# Patient Record
Sex: Female | Born: 1935 | Race: White | Hispanic: No | Marital: Married | Smoking: Never smoker
Health system: Southern US, Community
[De-identification: ages and names within clinical notes are randomized; demographics above are authoritative.]

## PROBLEM LIST (undated history)

## (undated) DIAGNOSIS — K648 Other hemorrhoids: Secondary | ICD-10-CM

## (undated) DIAGNOSIS — E785 Hyperlipidemia, unspecified: Secondary | ICD-10-CM

## (undated) DIAGNOSIS — Z923 Personal history of irradiation: Secondary | ICD-10-CM

## (undated) DIAGNOSIS — M797 Fibromyalgia: Secondary | ICD-10-CM

## (undated) DIAGNOSIS — F32A Depression, unspecified: Secondary | ICD-10-CM

## (undated) DIAGNOSIS — K589 Irritable bowel syndrome without diarrhea: Secondary | ICD-10-CM

## (undated) DIAGNOSIS — T7840XA Allergy, unspecified, initial encounter: Secondary | ICD-10-CM

## (undated) DIAGNOSIS — C50919 Malignant neoplasm of unspecified site of unspecified female breast: Secondary | ICD-10-CM

## (undated) DIAGNOSIS — K559 Vascular disorder of intestine, unspecified: Secondary | ICD-10-CM

## (undated) DIAGNOSIS — H269 Unspecified cataract: Secondary | ICD-10-CM

## (undated) DIAGNOSIS — K635 Polyp of colon: Secondary | ICD-10-CM

## (undated) DIAGNOSIS — K219 Gastro-esophageal reflux disease without esophagitis: Secondary | ICD-10-CM

## (undated) DIAGNOSIS — I341 Nonrheumatic mitral (valve) prolapse: Secondary | ICD-10-CM

## (undated) DIAGNOSIS — F419 Anxiety disorder, unspecified: Secondary | ICD-10-CM

## (undated) DIAGNOSIS — I10 Essential (primary) hypertension: Secondary | ICD-10-CM

## (undated) DIAGNOSIS — F329 Major depressive disorder, single episode, unspecified: Secondary | ICD-10-CM

## (undated) HISTORY — DX: Polyp of colon: K63.5

## (undated) HISTORY — PX: APPENDECTOMY: SHX54

## (undated) HISTORY — DX: Malignant neoplasm of unspecified site of unspecified female breast: C50.919

## (undated) HISTORY — PX: CATARACT EXTRACTION: SUR2

## (undated) HISTORY — PX: BACK SURGERY: SHX140

## (undated) HISTORY — PX: TOTAL ABDOMINAL HYSTERECTOMY: SHX209

## (undated) HISTORY — DX: Gastro-esophageal reflux disease without esophagitis: K21.9

## (undated) HISTORY — DX: Anxiety disorder, unspecified: F41.9

## (undated) HISTORY — DX: Fibromyalgia: M79.7

## (undated) HISTORY — PX: DILATION AND CURETTAGE OF UTERUS: SHX78

## (undated) HISTORY — DX: Depression, unspecified: F32.A

## (undated) HISTORY — DX: Vascular disorder of intestine, unspecified: K55.9

## (undated) HISTORY — DX: Nonrheumatic mitral (valve) prolapse: I34.1

## (undated) HISTORY — PX: CARPAL TUNNEL RELEASE: SHX101

## (undated) HISTORY — DX: Allergy, unspecified, initial encounter: T78.40XA

## (undated) HISTORY — PX: MASTECTOMY: SHX3

## (undated) HISTORY — DX: Irritable bowel syndrome, unspecified: K58.9

## (undated) HISTORY — DX: Major depressive disorder, single episode, unspecified: F32.9

## (undated) HISTORY — DX: Other hemorrhoids: K64.8

## (undated) HISTORY — DX: Hyperlipidemia, unspecified: E78.5

---

## 1998-01-29 ENCOUNTER — Other Ambulatory Visit: Admission: RE | Admit: 1998-01-29 | Discharge: 1998-01-29 | Payer: Self-pay | Admitting: Obstetrics and Gynecology

## 1998-05-01 ENCOUNTER — Encounter (HOSPITAL_COMMUNITY): Admission: RE | Admit: 1998-05-01 | Discharge: 1998-07-30 | Payer: Self-pay | Admitting: Internal Medicine

## 1998-05-02 ENCOUNTER — Ambulatory Visit (HOSPITAL_COMMUNITY): Admission: RE | Admit: 1998-05-02 | Discharge: 1998-05-02 | Payer: Self-pay | Admitting: Psychiatry

## 1999-06-05 ENCOUNTER — Emergency Department (HOSPITAL_COMMUNITY): Admission: EM | Admit: 1999-06-05 | Discharge: 1999-06-05 | Payer: Self-pay | Admitting: Emergency Medicine

## 1999-06-26 ENCOUNTER — Other Ambulatory Visit: Admission: RE | Admit: 1999-06-26 | Discharge: 1999-06-26 | Payer: Self-pay | Admitting: Obstetrics and Gynecology

## 1999-09-29 ENCOUNTER — Emergency Department (HOSPITAL_COMMUNITY): Admission: EM | Admit: 1999-09-29 | Discharge: 1999-09-29 | Payer: Self-pay | Admitting: Emergency Medicine

## 1999-11-10 ENCOUNTER — Encounter: Admission: RE | Admit: 1999-11-10 | Discharge: 1999-11-10 | Payer: Self-pay | Admitting: Gastroenterology

## 1999-11-10 ENCOUNTER — Encounter: Payer: Self-pay | Admitting: Gastroenterology

## 2000-02-05 ENCOUNTER — Encounter: Payer: Self-pay | Admitting: Allergy and Immunology

## 2000-02-05 ENCOUNTER — Encounter: Admission: RE | Admit: 2000-02-05 | Discharge: 2000-02-05 | Payer: Self-pay | Admitting: *Deleted

## 2000-02-16 ENCOUNTER — Encounter: Payer: Self-pay | Admitting: *Deleted

## 2000-02-16 ENCOUNTER — Encounter: Admission: RE | Admit: 2000-02-16 | Discharge: 2000-02-16 | Payer: Self-pay | Admitting: *Deleted

## 2000-10-18 ENCOUNTER — Encounter: Payer: Self-pay | Admitting: Emergency Medicine

## 2000-10-18 ENCOUNTER — Emergency Department (HOSPITAL_COMMUNITY): Admission: EM | Admit: 2000-10-18 | Discharge: 2000-10-18 | Payer: Self-pay | Admitting: Emergency Medicine

## 2001-01-27 ENCOUNTER — Encounter: Admission: RE | Admit: 2001-01-27 | Discharge: 2001-01-27 | Payer: Self-pay | Admitting: Obstetrics and Gynecology

## 2001-01-27 ENCOUNTER — Encounter: Payer: Self-pay | Admitting: Obstetrics and Gynecology

## 2001-02-28 ENCOUNTER — Other Ambulatory Visit: Admission: RE | Admit: 2001-02-28 | Discharge: 2001-02-28 | Payer: Self-pay | Admitting: Obstetrics and Gynecology

## 2001-06-27 ENCOUNTER — Encounter: Payer: Self-pay | Admitting: Family Medicine

## 2001-06-27 ENCOUNTER — Ambulatory Visit (HOSPITAL_COMMUNITY): Admission: RE | Admit: 2001-06-27 | Discharge: 2001-06-27 | Payer: Self-pay | Admitting: Family Medicine

## 2001-07-21 ENCOUNTER — Emergency Department (HOSPITAL_COMMUNITY): Admission: EM | Admit: 2001-07-21 | Discharge: 2001-07-21 | Payer: Self-pay | Admitting: Emergency Medicine

## 2002-04-19 ENCOUNTER — Other Ambulatory Visit: Admission: RE | Admit: 2002-04-19 | Discharge: 2002-04-19 | Payer: Self-pay | Admitting: Obstetrics and Gynecology

## 2003-01-15 ENCOUNTER — Encounter (INDEPENDENT_AMBULATORY_CARE_PROVIDER_SITE_OTHER): Payer: Self-pay | Admitting: Cardiology

## 2003-01-15 ENCOUNTER — Ambulatory Visit (HOSPITAL_COMMUNITY): Admission: RE | Admit: 2003-01-15 | Discharge: 2003-01-15 | Payer: Self-pay | Admitting: Cardiology

## 2003-04-10 ENCOUNTER — Encounter: Payer: Self-pay | Admitting: Obstetrics and Gynecology

## 2003-04-10 ENCOUNTER — Ambulatory Visit (HOSPITAL_COMMUNITY): Admission: RE | Admit: 2003-04-10 | Discharge: 2003-04-10 | Payer: Self-pay | Admitting: Obstetrics and Gynecology

## 2003-05-03 ENCOUNTER — Encounter: Admission: RE | Admit: 2003-05-03 | Discharge: 2003-05-03 | Payer: Self-pay | Admitting: Geriatric Medicine

## 2003-05-03 ENCOUNTER — Encounter: Payer: Self-pay | Admitting: Geriatric Medicine

## 2003-05-20 ENCOUNTER — Emergency Department (HOSPITAL_COMMUNITY): Admission: EM | Admit: 2003-05-20 | Discharge: 2003-05-21 | Payer: Self-pay | Admitting: Emergency Medicine

## 2003-05-21 ENCOUNTER — Encounter: Payer: Self-pay | Admitting: Emergency Medicine

## 2003-08-05 ENCOUNTER — Other Ambulatory Visit: Admission: RE | Admit: 2003-08-05 | Discharge: 2003-08-05 | Payer: Self-pay | Admitting: Obstetrics and Gynecology

## 2003-10-13 ENCOUNTER — Emergency Department (HOSPITAL_COMMUNITY): Admission: EM | Admit: 2003-10-13 | Discharge: 2003-10-13 | Payer: Self-pay | Admitting: Emergency Medicine

## 2004-02-09 ENCOUNTER — Encounter: Admission: RE | Admit: 2004-02-09 | Discharge: 2004-02-09 | Payer: Self-pay | Admitting: Geriatric Medicine

## 2004-05-19 ENCOUNTER — Observation Stay (HOSPITAL_COMMUNITY): Admission: AD | Admit: 2004-05-19 | Discharge: 2004-05-20 | Payer: Self-pay | Admitting: Internal Medicine

## 2004-07-04 ENCOUNTER — Inpatient Hospital Stay (HOSPITAL_COMMUNITY): Admission: EM | Admit: 2004-07-04 | Discharge: 2004-07-06 | Payer: Self-pay | Admitting: Emergency Medicine

## 2004-07-04 ENCOUNTER — Encounter (INDEPENDENT_AMBULATORY_CARE_PROVIDER_SITE_OTHER): Payer: Self-pay | Admitting: Specialist

## 2004-08-07 ENCOUNTER — Inpatient Hospital Stay (HOSPITAL_BASED_OUTPATIENT_CLINIC_OR_DEPARTMENT_OTHER): Admission: RE | Admit: 2004-08-07 | Discharge: 2004-08-07 | Payer: Self-pay | Admitting: Cardiology

## 2004-09-23 ENCOUNTER — Ambulatory Visit (HOSPITAL_COMMUNITY): Admission: RE | Admit: 2004-09-23 | Discharge: 2004-09-23 | Payer: Self-pay | Admitting: Allergy

## 2005-08-25 ENCOUNTER — Encounter: Admission: RE | Admit: 2005-08-25 | Discharge: 2005-11-23 | Payer: Self-pay | Admitting: *Deleted

## 2005-08-29 ENCOUNTER — Emergency Department (HOSPITAL_COMMUNITY): Admission: EM | Admit: 2005-08-29 | Discharge: 2005-08-29 | Payer: Self-pay | Admitting: *Deleted

## 2005-08-30 ENCOUNTER — Encounter: Admission: RE | Admit: 2005-08-30 | Discharge: 2005-08-30 | Payer: Self-pay | Admitting: Gastroenterology

## 2005-09-01 ENCOUNTER — Encounter: Admission: RE | Admit: 2005-09-01 | Discharge: 2005-09-01 | Payer: Self-pay | Admitting: Gastroenterology

## 2005-09-06 ENCOUNTER — Ambulatory Visit (HOSPITAL_COMMUNITY): Admission: RE | Admit: 2005-09-06 | Discharge: 2005-09-06 | Payer: Self-pay | Admitting: Gastroenterology

## 2005-09-27 ENCOUNTER — Encounter (INDEPENDENT_AMBULATORY_CARE_PROVIDER_SITE_OTHER): Payer: Self-pay | Admitting: Diagnostic Radiology

## 2005-09-27 ENCOUNTER — Encounter: Admission: RE | Admit: 2005-09-27 | Discharge: 2005-09-27 | Payer: Self-pay | Admitting: Gastroenterology

## 2005-09-27 ENCOUNTER — Encounter (INDEPENDENT_AMBULATORY_CARE_PROVIDER_SITE_OTHER): Payer: Self-pay | Admitting: *Deleted

## 2005-10-06 ENCOUNTER — Encounter: Admission: RE | Admit: 2005-10-06 | Discharge: 2005-10-06 | Payer: Self-pay | Admitting: Surgery

## 2005-10-12 ENCOUNTER — Ambulatory Visit (HOSPITAL_COMMUNITY): Admission: RE | Admit: 2005-10-12 | Discharge: 2005-10-12 | Payer: Self-pay | Admitting: Surgery

## 2005-10-14 ENCOUNTER — Emergency Department (HOSPITAL_COMMUNITY): Admission: EM | Admit: 2005-10-14 | Discharge: 2005-10-14 | Payer: Self-pay | Admitting: Emergency Medicine

## 2005-10-26 ENCOUNTER — Other Ambulatory Visit: Admission: RE | Admit: 2005-10-26 | Discharge: 2005-10-26 | Payer: Self-pay | Admitting: *Deleted

## 2005-11-02 ENCOUNTER — Inpatient Hospital Stay (HOSPITAL_COMMUNITY): Admission: RE | Admit: 2005-11-02 | Discharge: 2005-11-04 | Payer: Self-pay | Admitting: Surgery

## 2005-11-02 ENCOUNTER — Encounter (INDEPENDENT_AMBULATORY_CARE_PROVIDER_SITE_OTHER): Payer: Self-pay | Admitting: *Deleted

## 2005-11-09 ENCOUNTER — Ambulatory Visit: Payer: Self-pay | Admitting: Oncology

## 2005-11-15 ENCOUNTER — Ambulatory Visit: Admission: RE | Admit: 2005-11-15 | Discharge: 2006-01-26 | Payer: Self-pay | Admitting: *Deleted

## 2005-11-17 ENCOUNTER — Ambulatory Visit (HOSPITAL_COMMUNITY): Admission: RE | Admit: 2005-11-17 | Discharge: 2005-11-17 | Payer: Self-pay | Admitting: Oncology

## 2005-11-20 ENCOUNTER — Emergency Department (HOSPITAL_COMMUNITY): Admission: EM | Admit: 2005-11-20 | Discharge: 2005-11-20 | Payer: Self-pay | Admitting: Emergency Medicine

## 2005-12-05 ENCOUNTER — Encounter: Admission: RE | Admit: 2005-12-05 | Discharge: 2005-12-05 | Payer: Self-pay | Admitting: *Deleted

## 2005-12-10 ENCOUNTER — Encounter: Admission: RE | Admit: 2005-12-10 | Discharge: 2005-12-10 | Payer: Self-pay | Admitting: Surgery

## 2005-12-22 ENCOUNTER — Ambulatory Visit: Payer: Self-pay | Admitting: Oncology

## 2005-12-30 ENCOUNTER — Emergency Department (HOSPITAL_COMMUNITY): Admission: EM | Admit: 2005-12-30 | Discharge: 2005-12-30 | Payer: Self-pay | Admitting: Emergency Medicine

## 2006-01-10 ENCOUNTER — Encounter: Admission: RE | Admit: 2006-01-10 | Discharge: 2006-02-15 | Payer: Self-pay | Admitting: Oncology

## 2006-01-20 LAB — CBC WITH DIFFERENTIAL/PLATELET
Eosinophils Absolute: 0.1 10*3/uL (ref 0.0–0.5)
MONO#: 0.7 10*3/uL (ref 0.1–0.9)
MONO%: 15.1 % — ABNORMAL HIGH (ref 0.0–13.0)
NEUT#: 3.2 10*3/uL (ref 1.5–6.5)
RBC: 3.59 10*6/uL — ABNORMAL LOW (ref 3.70–5.32)
RDW: 13.8 % (ref 11.3–14.5)
WBC: 4.7 10*3/uL (ref 3.9–10.0)

## 2006-01-26 ENCOUNTER — Encounter: Admission: RE | Admit: 2006-01-26 | Discharge: 2006-01-26 | Payer: Self-pay | Admitting: Oncology

## 2006-02-12 ENCOUNTER — Observation Stay (HOSPITAL_COMMUNITY): Admission: EM | Admit: 2006-02-12 | Discharge: 2006-02-13 | Payer: Self-pay | Admitting: Emergency Medicine

## 2006-03-01 ENCOUNTER — Ambulatory Visit: Payer: Self-pay | Admitting: Oncology

## 2006-03-24 ENCOUNTER — Ambulatory Visit: Payer: Self-pay | Admitting: Pulmonary Disease

## 2006-03-28 ENCOUNTER — Ambulatory Visit: Payer: Self-pay | Admitting: Cardiology

## 2006-03-29 ENCOUNTER — Ambulatory Visit: Payer: Self-pay | Admitting: Internal Medicine

## 2006-04-05 ENCOUNTER — Encounter: Admission: RE | Admit: 2006-04-05 | Discharge: 2006-04-05 | Payer: Self-pay | Admitting: Oncology

## 2006-04-18 ENCOUNTER — Ambulatory Visit: Payer: Self-pay | Admitting: Oncology

## 2006-04-22 ENCOUNTER — Ambulatory Visit (HOSPITAL_COMMUNITY): Admission: RE | Admit: 2006-04-22 | Discharge: 2006-04-22 | Payer: Self-pay | Admitting: Oncology

## 2006-05-24 ENCOUNTER — Encounter: Admission: RE | Admit: 2006-05-24 | Discharge: 2006-05-24 | Payer: Self-pay | Admitting: Neurosurgery

## 2006-07-17 ENCOUNTER — Emergency Department (HOSPITAL_COMMUNITY): Admission: EM | Admit: 2006-07-17 | Discharge: 2006-07-17 | Payer: Self-pay | Admitting: Emergency Medicine

## 2006-07-29 ENCOUNTER — Ambulatory Visit: Payer: Self-pay | Admitting: Oncology

## 2006-08-05 ENCOUNTER — Encounter: Admission: RE | Admit: 2006-08-05 | Discharge: 2006-08-05 | Payer: Self-pay | Admitting: Oncology

## 2006-08-10 ENCOUNTER — Encounter: Admission: RE | Admit: 2006-08-10 | Discharge: 2006-08-10 | Payer: Self-pay | Admitting: Oncology

## 2006-09-29 ENCOUNTER — Ambulatory Visit: Payer: Self-pay | Admitting: Oncology

## 2006-10-18 HISTORY — PX: ESOPHAGOGASTRODUODENOSCOPY: SHX1529

## 2006-11-11 ENCOUNTER — Ambulatory Visit: Payer: Self-pay | Admitting: Oncology

## 2006-11-15 LAB — COMPREHENSIVE METABOLIC PANEL
Albumin: 4.3 g/dL (ref 3.5–5.2)
CO2: 28 mEq/L (ref 19–32)
Calcium: 9.4 mg/dL (ref 8.4–10.5)
Chloride: 104 mEq/L (ref 96–112)
Glucose, Bld: 108 mg/dL — ABNORMAL HIGH (ref 70–99)
Sodium: 141 mEq/L (ref 135–145)
Total Bilirubin: 0.3 mg/dL (ref 0.3–1.2)
Total Protein: 6.7 g/dL (ref 6.0–8.3)

## 2006-11-15 LAB — CBC WITH DIFFERENTIAL/PLATELET
Eosinophils Absolute: 0.1 10*3/uL (ref 0.0–0.5)
HCT: 37.7 % (ref 34.8–46.6)
LYMPH%: 25.3 % (ref 14.0–48.0)
MONO#: 0.5 10*3/uL (ref 0.1–0.9)
NEUT#: 3.2 10*3/uL (ref 1.5–6.5)
Platelets: 259 10*3/uL (ref 145–400)
RBC: 3.94 10*6/uL (ref 3.70–5.32)
WBC: 5 10*3/uL (ref 3.9–10.0)
lymph#: 1.3 10*3/uL (ref 0.9–3.3)

## 2006-11-15 LAB — CANCER ANTIGEN 27.29: CA 27.29: 34 U/mL (ref 0–39)

## 2006-11-17 ENCOUNTER — Ambulatory Visit (HOSPITAL_COMMUNITY): Admission: RE | Admit: 2006-11-17 | Discharge: 2006-11-17 | Payer: Self-pay | Admitting: Oncology

## 2006-12-08 ENCOUNTER — Encounter: Admission: RE | Admit: 2006-12-08 | Discharge: 2006-12-08 | Payer: Self-pay | Admitting: Neurosurgery

## 2006-12-21 ENCOUNTER — Other Ambulatory Visit: Admission: RE | Admit: 2006-12-21 | Discharge: 2006-12-21 | Payer: Self-pay | Admitting: *Deleted

## 2007-01-11 ENCOUNTER — Inpatient Hospital Stay (HOSPITAL_COMMUNITY): Admission: EM | Admit: 2007-01-11 | Discharge: 2007-01-13 | Payer: Self-pay | Admitting: Emergency Medicine

## 2007-01-26 ENCOUNTER — Encounter: Admission: RE | Admit: 2007-01-26 | Discharge: 2007-04-14 | Payer: Self-pay | Admitting: Oncology

## 2007-02-18 ENCOUNTER — Emergency Department (HOSPITAL_COMMUNITY): Admission: EM | Admit: 2007-02-18 | Discharge: 2007-02-18 | Payer: Self-pay | Admitting: *Deleted

## 2007-02-27 ENCOUNTER — Encounter: Admission: RE | Admit: 2007-02-27 | Discharge: 2007-02-27 | Payer: Self-pay | Admitting: Gastroenterology

## 2007-04-07 ENCOUNTER — Encounter: Admission: RE | Admit: 2007-04-07 | Discharge: 2007-04-07 | Payer: Self-pay | Admitting: Oncology

## 2007-04-18 DIAGNOSIS — D126 Benign neoplasm of colon, unspecified: Secondary | ICD-10-CM

## 2007-05-09 ENCOUNTER — Emergency Department (HOSPITAL_COMMUNITY): Admission: EM | Admit: 2007-05-09 | Discharge: 2007-05-09 | Payer: Self-pay | Admitting: Emergency Medicine

## 2007-05-12 ENCOUNTER — Encounter: Payer: Self-pay | Admitting: Internal Medicine

## 2007-06-01 ENCOUNTER — Ambulatory Visit: Payer: Self-pay | Admitting: Oncology

## 2007-06-05 LAB — CBC WITH DIFFERENTIAL/PLATELET
BASO%: 0.6 % (ref 0.0–2.0)
EOS%: 3.5 % (ref 0.0–7.0)
HCT: 34.2 % — ABNORMAL LOW (ref 34.8–46.6)
MCH: 33.3 pg (ref 26.0–34.0)
MCHC: 35.7 g/dL (ref 32.0–36.0)
MONO#: 0.5 10*3/uL (ref 0.1–0.9)
RBC: 3.67 10*6/uL — ABNORMAL LOW (ref 3.70–5.32)
RDW: 11.9 % (ref 11.3–14.5)
WBC: 5 10*3/uL (ref 3.9–10.0)
lymph#: 1.6 10*3/uL (ref 0.9–3.3)

## 2007-06-05 LAB — COMPREHENSIVE METABOLIC PANEL
ALT: 16 U/L (ref 0–35)
AST: 18 U/L (ref 0–37)
Albumin: 4.1 g/dL (ref 3.5–5.2)
Alkaline Phosphatase: 60 U/L (ref 39–117)
Potassium: 3.8 mEq/L (ref 3.5–5.3)
Sodium: 140 mEq/L (ref 135–145)
Total Protein: 6.7 g/dL (ref 6.0–8.3)

## 2007-06-21 ENCOUNTER — Ambulatory Visit: Payer: Self-pay | Admitting: Psychiatry

## 2007-06-27 ENCOUNTER — Ambulatory Visit: Payer: Self-pay | Admitting: Psychiatry

## 2007-07-12 ENCOUNTER — Ambulatory Visit: Payer: Self-pay | Admitting: Psychiatry

## 2007-07-20 ENCOUNTER — Ambulatory Visit: Payer: Self-pay | Admitting: Psychiatry

## 2007-07-27 ENCOUNTER — Ambulatory Visit: Payer: Self-pay | Admitting: Psychiatry

## 2007-08-10 ENCOUNTER — Ambulatory Visit: Payer: Self-pay | Admitting: Psychiatry

## 2007-08-29 ENCOUNTER — Encounter: Admission: RE | Admit: 2007-08-29 | Discharge: 2007-09-12 | Payer: Self-pay | Admitting: Surgery

## 2007-08-30 ENCOUNTER — Ambulatory Visit: Payer: Self-pay | Admitting: Psychiatry

## 2007-09-11 ENCOUNTER — Ambulatory Visit: Payer: Self-pay | Admitting: Psychiatry

## 2007-09-26 ENCOUNTER — Ambulatory Visit: Payer: Self-pay | Admitting: Psychiatry

## 2007-10-16 ENCOUNTER — Ambulatory Visit: Payer: Self-pay | Admitting: Psychiatry

## 2007-10-30 ENCOUNTER — Ambulatory Visit: Payer: Self-pay | Admitting: Psychiatry

## 2007-11-24 ENCOUNTER — Ambulatory Visit: Payer: Self-pay | Admitting: Oncology

## 2007-11-28 ENCOUNTER — Ambulatory Visit (HOSPITAL_COMMUNITY): Admission: RE | Admit: 2007-11-28 | Discharge: 2007-11-28 | Payer: Self-pay | Admitting: Oncology

## 2007-11-28 LAB — COMPREHENSIVE METABOLIC PANEL
AST: 19 U/L (ref 0–37)
Albumin: 3.6 g/dL (ref 3.5–5.2)
Alkaline Phosphatase: 53 U/L (ref 39–117)
BUN: 15 mg/dL (ref 6–23)
Calcium: 9.2 mg/dL (ref 8.4–10.5)
Chloride: 103 mEq/L (ref 96–112)
Creatinine, Ser: 0.69 mg/dL (ref 0.40–1.20)
Glucose, Bld: 124 mg/dL — ABNORMAL HIGH (ref 70–99)
Potassium: 4.2 mEq/L (ref 3.5–5.3)

## 2007-11-28 LAB — CBC WITH DIFFERENTIAL/PLATELET
Basophils Absolute: 0 10*3/uL (ref 0.0–0.1)
EOS%: 1.4 % (ref 0.0–7.0)
Eosinophils Absolute: 0.1 10*3/uL (ref 0.0–0.5)
MCHC: 34.9 g/dL (ref 32.0–36.0)
MONO%: 5.1 % (ref 0.0–13.0)
NEUT%: 82.2 % — ABNORMAL HIGH (ref 39.6–76.8)
Platelets: 305 10*3/uL (ref 145–400)
RBC: 4.17 10*6/uL (ref 3.70–5.32)
WBC: 9.5 10*3/uL (ref 3.9–10.0)
lymph#: 1 10*3/uL (ref 0.9–3.3)

## 2007-11-29 ENCOUNTER — Ambulatory Visit: Payer: Self-pay | Admitting: Internal Medicine

## 2007-12-12 ENCOUNTER — Ambulatory Visit: Payer: Self-pay | Admitting: Internal Medicine

## 2007-12-12 LAB — CONVERTED CEMR LAB
Fecal Occult Blood: NEGATIVE
OCCULT 4: NEGATIVE
OCCULT 5: NEGATIVE

## 2007-12-20 ENCOUNTER — Other Ambulatory Visit: Admission: RE | Admit: 2007-12-20 | Discharge: 2007-12-20 | Payer: Self-pay | Admitting: *Deleted

## 2008-01-27 ENCOUNTER — Encounter: Admission: RE | Admit: 2008-01-27 | Discharge: 2008-01-27 | Payer: Self-pay | Admitting: Internal Medicine

## 2008-03-04 ENCOUNTER — Telehealth: Payer: Self-pay | Admitting: Internal Medicine

## 2008-03-05 ENCOUNTER — Ambulatory Visit: Payer: Self-pay | Admitting: Gastroenterology

## 2008-03-05 DIAGNOSIS — J45909 Unspecified asthma, uncomplicated: Secondary | ICD-10-CM

## 2008-03-05 DIAGNOSIS — R197 Diarrhea, unspecified: Secondary | ICD-10-CM | POA: Insufficient documentation

## 2008-03-05 DIAGNOSIS — F411 Generalized anxiety disorder: Secondary | ICD-10-CM

## 2008-03-05 DIAGNOSIS — K921 Melena: Secondary | ICD-10-CM

## 2008-03-05 DIAGNOSIS — K589 Irritable bowel syndrome without diarrhea: Secondary | ICD-10-CM

## 2008-03-05 DIAGNOSIS — K219 Gastro-esophageal reflux disease without esophagitis: Secondary | ICD-10-CM

## 2008-03-05 DIAGNOSIS — Z8719 Personal history of other diseases of the digestive system: Secondary | ICD-10-CM

## 2008-03-05 DIAGNOSIS — R110 Nausea: Secondary | ICD-10-CM

## 2008-03-05 DIAGNOSIS — R1084 Generalized abdominal pain: Secondary | ICD-10-CM | POA: Insufficient documentation

## 2008-03-05 LAB — CONVERTED CEMR LAB
ALT: 17 units/L (ref 0–35)
AST: 21 units/L (ref 0–37)
Albumin: 3.8 g/dL (ref 3.5–5.2)
Alkaline Phosphatase: 41 units/L (ref 39–117)
BUN: 12 mg/dL (ref 6–23)
Basophils Absolute: 0 10*3/uL (ref 0.0–0.1)
CO2: 31 meq/L (ref 19–32)
Calcium: 9.4 mg/dL (ref 8.4–10.5)
Chloride: 105 meq/L (ref 96–112)
GFR calc Af Amer: 127 mL/min
HCT: 38 % (ref 36.0–46.0)
Monocytes Relative: 8.4 % (ref 3.0–12.0)
Neutro Abs: 3 10*3/uL (ref 1.4–7.7)
Neutrophils Relative %: 62.2 % (ref 43.0–77.0)
Platelets: 256 10*3/uL (ref 150–400)
Potassium: 4.2 meq/L (ref 3.5–5.1)
RBC: 3.93 M/uL (ref 3.87–5.11)
Total Protein: 6.7 g/dL (ref 6.0–8.3)
WBC: 4.8 10*3/uL (ref 4.5–10.5)

## 2008-03-07 ENCOUNTER — Telehealth: Payer: Self-pay | Admitting: Physician Assistant

## 2008-04-08 ENCOUNTER — Encounter: Admission: RE | Admit: 2008-04-08 | Discharge: 2008-04-08 | Payer: Self-pay | Admitting: Oncology

## 2008-05-09 ENCOUNTER — Ambulatory Visit: Payer: Self-pay | Admitting: Internal Medicine

## 2008-05-09 LAB — CONVERTED CEMR LAB
OCCULT 3: NEGATIVE
OCCULT 5: NEGATIVE

## 2008-05-24 ENCOUNTER — Ambulatory Visit: Payer: Self-pay | Admitting: Oncology

## 2008-05-28 ENCOUNTER — Encounter: Admission: RE | Admit: 2008-05-28 | Discharge: 2008-05-28 | Payer: Self-pay | Admitting: Oncology

## 2008-06-04 ENCOUNTER — Encounter: Payer: Self-pay | Admitting: Internal Medicine

## 2008-06-10 ENCOUNTER — Ambulatory Visit (HOSPITAL_COMMUNITY): Admission: RE | Admit: 2008-06-10 | Discharge: 2008-06-10 | Payer: Self-pay | Admitting: Interventional Radiology

## 2008-06-10 ENCOUNTER — Encounter (INDEPENDENT_AMBULATORY_CARE_PROVIDER_SITE_OTHER): Payer: Self-pay | Admitting: Interventional Radiology

## 2008-06-26 ENCOUNTER — Encounter: Payer: Self-pay | Admitting: Internal Medicine

## 2008-07-17 ENCOUNTER — Telehealth: Payer: Self-pay | Admitting: Internal Medicine

## 2008-08-06 ENCOUNTER — Emergency Department (HOSPITAL_COMMUNITY): Admission: EM | Admit: 2008-08-06 | Discharge: 2008-08-06 | Payer: Self-pay | Admitting: Emergency Medicine

## 2008-08-08 ENCOUNTER — Telehealth: Payer: Self-pay | Admitting: Internal Medicine

## 2008-08-20 DIAGNOSIS — F329 Major depressive disorder, single episode, unspecified: Secondary | ICD-10-CM

## 2008-08-20 DIAGNOSIS — Z860100 Personal history of colon polyps, unspecified: Secondary | ICD-10-CM | POA: Insufficient documentation

## 2008-08-20 DIAGNOSIS — I059 Rheumatic mitral valve disease, unspecified: Secondary | ICD-10-CM

## 2008-08-26 ENCOUNTER — Ambulatory Visit: Payer: Self-pay | Admitting: Internal Medicine

## 2008-09-19 ENCOUNTER — Encounter: Payer: Self-pay | Admitting: Internal Medicine

## 2008-09-19 ENCOUNTER — Encounter: Admission: RE | Admit: 2008-09-19 | Discharge: 2008-09-19 | Payer: Self-pay | Admitting: Internal Medicine

## 2008-11-05 ENCOUNTER — Telehealth: Payer: Self-pay | Admitting: Internal Medicine

## 2008-11-14 ENCOUNTER — Ambulatory Visit: Payer: Self-pay | Admitting: Internal Medicine

## 2008-11-27 ENCOUNTER — Telehealth: Payer: Self-pay | Admitting: Internal Medicine

## 2008-11-29 ENCOUNTER — Ambulatory Visit: Payer: Self-pay | Admitting: Oncology

## 2008-12-03 LAB — COMPREHENSIVE METABOLIC PANEL
Albumin: 4.3 g/dL (ref 3.5–5.2)
CO2: 25 mEq/L (ref 19–32)
Calcium: 9.7 mg/dL (ref 8.4–10.5)
Chloride: 103 mEq/L (ref 96–112)
Glucose, Bld: 118 mg/dL — ABNORMAL HIGH (ref 70–99)
Potassium: 4.1 mEq/L (ref 3.5–5.3)
Sodium: 141 mEq/L (ref 135–145)
Total Protein: 6.6 g/dL (ref 6.0–8.3)

## 2008-12-03 LAB — CBC WITH DIFFERENTIAL/PLATELET
Eosinophils Absolute: 0.1 10*3/uL (ref 0.0–0.5)
HGB: 12.3 g/dL (ref 11.6–15.9)
MONO#: 0.4 10*3/uL (ref 0.1–0.9)
NEUT#: 3 10*3/uL (ref 1.5–6.5)
Platelets: 259 10*3/uL (ref 145–400)
RBC: 3.69 10*6/uL — ABNORMAL LOW (ref 3.70–5.32)
RDW: 12 % (ref 11.3–14.5)
WBC: 5.2 10*3/uL (ref 3.9–10.0)

## 2008-12-03 LAB — CANCER ANTIGEN 27.29: CA 27.29: 30 U/mL (ref 0–39)

## 2008-12-10 ENCOUNTER — Encounter: Payer: Self-pay | Admitting: Internal Medicine

## 2008-12-31 LAB — RESEARCH LABS

## 2009-02-02 ENCOUNTER — Emergency Department (HOSPITAL_COMMUNITY): Admission: EM | Admit: 2009-02-02 | Discharge: 2009-02-02 | Payer: Self-pay | Admitting: Emergency Medicine

## 2009-02-21 ENCOUNTER — Telehealth: Payer: Self-pay | Admitting: Internal Medicine

## 2009-02-25 ENCOUNTER — Ambulatory Visit: Payer: Self-pay | Admitting: Internal Medicine

## 2009-02-25 LAB — CONVERTED CEMR LAB
CO2: 31 meq/L (ref 19–32)
Calcium: 9.5 mg/dL (ref 8.4–10.5)
Creatinine, Ser: 0.8 mg/dL (ref 0.4–1.2)
Phosphorus: 4 mg/dL (ref 2.3–4.6)

## 2009-03-03 ENCOUNTER — Encounter: Admission: RE | Admit: 2009-03-03 | Discharge: 2009-03-03 | Payer: Self-pay | Admitting: Internal Medicine

## 2009-03-04 LAB — CONVERTED CEMR LAB: Tissue Transglutaminase Ab, IgA: 0.5 units (ref <7)

## 2009-04-09 ENCOUNTER — Encounter: Admission: RE | Admit: 2009-04-09 | Discharge: 2009-04-09 | Payer: Self-pay | Admitting: Oncology

## 2009-06-19 ENCOUNTER — Telehealth: Payer: Self-pay | Admitting: Internal Medicine

## 2009-06-22 ENCOUNTER — Emergency Department (HOSPITAL_COMMUNITY): Admission: EM | Admit: 2009-06-22 | Discharge: 2009-06-22 | Payer: Self-pay | Admitting: Emergency Medicine

## 2009-09-13 ENCOUNTER — Encounter: Admission: RE | Admit: 2009-09-13 | Discharge: 2009-09-13 | Payer: Self-pay | Admitting: Neurosurgery

## 2009-10-01 ENCOUNTER — Encounter: Admission: RE | Admit: 2009-10-01 | Discharge: 2009-10-01 | Payer: Self-pay | Admitting: Neurosurgery

## 2009-11-21 ENCOUNTER — Ambulatory Visit: Payer: Self-pay | Admitting: Gastroenterology

## 2009-11-21 ENCOUNTER — Telehealth: Payer: Self-pay | Admitting: Internal Medicine

## 2009-11-21 DIAGNOSIS — Z853 Personal history of malignant neoplasm of breast: Secondary | ICD-10-CM

## 2009-11-21 DIAGNOSIS — R1013 Epigastric pain: Secondary | ICD-10-CM

## 2009-11-21 DIAGNOSIS — R142 Eructation: Secondary | ICD-10-CM

## 2009-11-21 DIAGNOSIS — R141 Gas pain: Secondary | ICD-10-CM | POA: Insufficient documentation

## 2009-11-21 DIAGNOSIS — R143 Flatulence: Secondary | ICD-10-CM

## 2009-11-28 ENCOUNTER — Ambulatory Visit: Payer: Self-pay | Admitting: Oncology

## 2009-12-02 LAB — COMPREHENSIVE METABOLIC PANEL
AST: 26 U/L (ref 0–37)
Albumin: 3.8 g/dL (ref 3.5–5.2)
BUN: 11 mg/dL (ref 6–23)
Calcium: 9.1 mg/dL (ref 8.4–10.5)
Chloride: 103 mEq/L (ref 96–112)
Creatinine, Ser: 0.65 mg/dL (ref 0.40–1.20)
Glucose, Bld: 152 mg/dL — ABNORMAL HIGH (ref 70–99)
Potassium: 3.5 mEq/L (ref 3.5–5.3)

## 2009-12-02 LAB — CBC WITH DIFFERENTIAL/PLATELET
Basophils Absolute: 0 10*3/uL (ref 0.0–0.1)
EOS%: 1.8 % (ref 0.0–7.0)
Eosinophils Absolute: 0.1 10*3/uL (ref 0.0–0.5)
HCT: 35.3 % (ref 34.8–46.6)
HGB: 12.3 g/dL (ref 11.6–15.9)
MCH: 33.5 pg (ref 25.1–34.0)
MCV: 96.3 fL (ref 79.5–101.0)
NEUT#: 3.1 10*3/uL (ref 1.5–6.5)
NEUT%: 59.2 % (ref 38.4–76.8)
lymph#: 1.6 10*3/uL (ref 0.9–3.3)

## 2009-12-02 LAB — CANCER ANTIGEN 27.29: CA 27.29: 45 U/mL — ABNORMAL HIGH (ref 0–39)

## 2009-12-09 ENCOUNTER — Encounter: Payer: Self-pay | Admitting: Internal Medicine

## 2009-12-09 ENCOUNTER — Telehealth: Payer: Self-pay | Admitting: Internal Medicine

## 2010-01-02 ENCOUNTER — Ambulatory Visit: Payer: Self-pay | Admitting: Oncology

## 2010-01-06 LAB — CBC WITH DIFFERENTIAL/PLATELET
BASO%: 0.5 % (ref 0.0–2.0)
EOS%: 1.1 % (ref 0.0–7.0)
HCT: 37.4 % (ref 34.8–46.6)
LYMPH%: 32.8 % (ref 14.0–49.7)
MCH: 32.3 pg (ref 25.1–34.0)
MCHC: 34.5 g/dL (ref 31.5–36.0)
MCV: 93.7 fL (ref 79.5–101.0)
MONO%: 10.9 % (ref 0.0–14.0)
NEUT%: 54.7 % (ref 38.4–76.8)
Platelets: 231 10*3/uL (ref 145–400)

## 2010-01-14 LAB — COMPREHENSIVE METABOLIC PANEL
AST: 20 U/L (ref 0–37)
Alkaline Phosphatase: 64 U/L (ref 39–117)
BUN: 13 mg/dL (ref 6–23)
Creatinine, Ser: 0.71 mg/dL (ref 0.40–1.20)
Glucose, Bld: 114 mg/dL — ABNORMAL HIGH (ref 70–99)

## 2010-01-21 ENCOUNTER — Encounter (INDEPENDENT_AMBULATORY_CARE_PROVIDER_SITE_OTHER): Payer: Self-pay | Admitting: *Deleted

## 2010-01-21 ENCOUNTER — Ambulatory Visit (HOSPITAL_COMMUNITY): Admission: RE | Admit: 2010-01-21 | Discharge: 2010-01-21 | Payer: Self-pay | Admitting: Neurosurgery

## 2010-02-23 ENCOUNTER — Ambulatory Visit: Payer: Self-pay | Admitting: Oncology

## 2010-02-24 LAB — CBC WITH DIFFERENTIAL/PLATELET
BASO%: 0.4 % (ref 0.0–2.0)
Basophils Absolute: 0 10*3/uL (ref 0.0–0.1)
EOS%: 1.6 % (ref 0.0–7.0)
HGB: 12.2 g/dL (ref 11.6–15.9)
MCH: 34 pg (ref 25.1–34.0)
MCHC: 35.5 g/dL (ref 31.5–36.0)
MONO#: 0.5 10*3/uL (ref 0.1–0.9)
RDW: 12.1 % (ref 11.2–14.5)
WBC: 4.8 10*3/uL (ref 3.9–10.3)
lymph#: 1.7 10*3/uL (ref 0.9–3.3)

## 2010-02-24 LAB — COMPREHENSIVE METABOLIC PANEL
ALT: 14 U/L (ref 0–35)
AST: 20 U/L (ref 0–37)
Albumin: 4.1 g/dL (ref 3.5–5.2)
Calcium: 9.2 mg/dL (ref 8.4–10.5)
Chloride: 103 mEq/L (ref 96–112)
Potassium: 4 mEq/L (ref 3.5–5.3)

## 2010-02-24 LAB — VITAMIN D 25 HYDROXY (VIT D DEFICIENCY, FRACTURES): Vit D, 25-Hydroxy: 36 ng/mL (ref 30–89)

## 2010-03-03 ENCOUNTER — Encounter: Payer: Self-pay | Admitting: Internal Medicine

## 2010-04-01 ENCOUNTER — Encounter: Payer: Self-pay | Admitting: Oncology

## 2010-04-01 ENCOUNTER — Ambulatory Visit: Payer: Self-pay | Admitting: Oncology

## 2010-04-01 ENCOUNTER — Ambulatory Visit: Payer: Self-pay | Admitting: Vascular Surgery

## 2010-04-01 ENCOUNTER — Ambulatory Visit: Admission: RE | Admit: 2010-04-01 | Discharge: 2010-04-01 | Payer: Self-pay | Admitting: Oncology

## 2010-04-01 LAB — CBC WITH DIFFERENTIAL/PLATELET
Basophils Absolute: 0 10*3/uL (ref 0.0–0.1)
EOS%: 1.7 % (ref 0.0–7.0)
Eosinophils Absolute: 0.1 10*3/uL (ref 0.0–0.5)
HCT: 35.7 % (ref 34.8–46.6)
HGB: 12.4 g/dL (ref 11.6–15.9)
MONO#: 0.3 10*3/uL (ref 0.1–0.9)
NEUT#: 2.9 10*3/uL (ref 1.5–6.5)
NEUT%: 61.9 % (ref 38.4–76.8)
RDW: 12.5 % (ref 11.2–14.5)
WBC: 4.7 10*3/uL (ref 3.9–10.3)
lymph#: 1.3 10*3/uL (ref 0.9–3.3)

## 2010-04-01 LAB — COMPREHENSIVE METABOLIC PANEL
AST: 22 U/L (ref 0–37)
Albumin: 3.6 g/dL (ref 3.5–5.2)
BUN: 11 mg/dL (ref 6–23)
CO2: 30 mEq/L (ref 19–32)
Calcium: 8.8 mg/dL (ref 8.4–10.5)
Chloride: 105 mEq/L (ref 96–112)
Glucose, Bld: 125 mg/dL — ABNORMAL HIGH (ref 70–99)
Potassium: 3.5 mEq/L (ref 3.5–5.3)

## 2010-04-13 ENCOUNTER — Encounter: Admission: RE | Admit: 2010-04-13 | Discharge: 2010-04-13 | Payer: Self-pay | Admitting: Oncology

## 2010-06-04 ENCOUNTER — Ambulatory Visit: Payer: Self-pay | Admitting: Oncology

## 2010-06-04 LAB — COMPREHENSIVE METABOLIC PANEL
BUN: 10 mg/dL (ref 6–23)
CO2: 24 mEq/L (ref 19–32)
Calcium: 8.8 mg/dL (ref 8.4–10.5)
Chloride: 106 mEq/L (ref 96–112)
Creatinine, Ser: 0.76 mg/dL (ref 0.40–1.20)
Total Bilirubin: 0.3 mg/dL (ref 0.3–1.2)

## 2010-06-04 LAB — CBC WITH DIFFERENTIAL/PLATELET
BASO%: 0.5 % (ref 0.0–2.0)
Basophils Absolute: 0 10*3/uL (ref 0.0–0.1)
HCT: 34.8 % (ref 34.8–46.6)
HGB: 12.1 g/dL (ref 11.6–15.9)
LYMPH%: 30.9 % (ref 14.0–49.7)
MCH: 33.7 pg (ref 25.1–34.0)
MCHC: 34.9 g/dL (ref 31.5–36.0)
MONO#: 0.4 10*3/uL (ref 0.1–0.9)
NEUT%: 59 % (ref 38.4–76.8)
Platelets: 227 10*3/uL (ref 145–400)
WBC: 5 10*3/uL (ref 3.9–10.3)
lymph#: 1.5 10*3/uL (ref 0.9–3.3)

## 2010-06-11 ENCOUNTER — Encounter: Payer: Self-pay | Admitting: Internal Medicine

## 2010-06-11 LAB — CANCER ANTIGEN 27.29: CA 27.29: 69 U/mL — ABNORMAL HIGH (ref 0–39)

## 2010-07-14 ENCOUNTER — Ambulatory Visit: Payer: Self-pay | Admitting: Oncology

## 2010-07-23 ENCOUNTER — Telehealth: Payer: Self-pay | Admitting: Internal Medicine

## 2010-08-04 ENCOUNTER — Encounter: Payer: Self-pay | Admitting: Internal Medicine

## 2010-08-06 ENCOUNTER — Telehealth: Payer: Self-pay | Admitting: Internal Medicine

## 2010-09-04 ENCOUNTER — Ambulatory Visit: Payer: Self-pay | Admitting: Oncology

## 2010-09-16 ENCOUNTER — Telehealth: Payer: Self-pay | Admitting: Internal Medicine

## 2010-09-17 ENCOUNTER — Ambulatory Visit: Payer: Self-pay | Admitting: Internal Medicine

## 2010-10-07 ENCOUNTER — Ambulatory Visit: Payer: Self-pay | Admitting: Oncology

## 2010-10-26 ENCOUNTER — Ambulatory Visit: Admit: 2010-10-26 | Payer: Self-pay | Admitting: Internal Medicine

## 2010-10-27 ENCOUNTER — Encounter
Admission: RE | Admit: 2010-10-27 | Discharge: 2010-10-27 | Payer: Self-pay | Source: Home / Self Care | Attending: Cardiology | Admitting: Cardiology

## 2010-11-05 ENCOUNTER — Ambulatory Visit
Admission: RE | Admit: 2010-11-05 | Payer: Self-pay | Source: Home / Self Care | Attending: Cardiology | Admitting: Cardiology

## 2010-11-07 ENCOUNTER — Encounter: Payer: Self-pay | Admitting: Surgery

## 2010-11-07 ENCOUNTER — Encounter: Payer: Self-pay | Admitting: Oncology

## 2010-11-08 ENCOUNTER — Encounter: Payer: Self-pay | Admitting: Neurosurgery

## 2010-11-08 ENCOUNTER — Encounter: Payer: Self-pay | Admitting: Oncology

## 2010-11-08 ENCOUNTER — Encounter: Payer: Self-pay | Admitting: *Deleted

## 2010-11-09 ENCOUNTER — Ambulatory Visit: Payer: Self-pay | Admitting: Oncology

## 2010-11-19 NOTE — Medication Information (Signed)
Summary: Drug Change Request/[facility]  Drug Change Request/[facility]   Imported By: Sherian Rein 08/10/2010 15:10:36  _____________________________________________________________________  External Attachment:    Type:   Image     Comment:   External Document

## 2010-11-19 NOTE — Assessment & Plan Note (Signed)
Summary: diarrhea, difficulty with meds/dns   History of Present Illness Visit Type: Follow-up Visit Primary GI MD: Lina Sar MD Primary Provider: [name], [name] Requesting Provider: na Chief Complaint: Pt c/o diarrhea and she thinks it is due to some of the medicines that she takes but not sure  History of Present Illness:   This is a 75 year old white female with metastatic breast cancer to the bone and a history of gastroesophageal reflux and bile gastritis. She was last seen in February 2011. She is now complaining of irregular bowel habits with predominant constipation followed by urgent loose stools. She denies any rectal bleeding. Her last colonoscopy in July 2008 by Dr.Buccini showed 2 adenomatous polyps. She is due for a recall colonoscopy. She also has a history of ischemic colitis.   GI Review of Systems      Denies abdominal pain, acid reflux, belching, bloating, chest pain, dysphagia with liquids, dysphagia with solids, heartburn, loss of appetite, nausea, vomiting, vomiting blood, weight loss, and  weight gain.      Reports diarrhea.     Denies anal fissure, black tarry stools, change in bowel habit, constipation, diverticulosis, fecal incontinence, heme positive stool, hemorrhoids, irritable bowel syndrome, jaundice, light color stool, liver problems, rectal bleeding, and  rectal pain.    Current Medications (verified): 1)  Bentyl 10 Mg Caps (Dicyclomine Hcl) .... Take 1 Capsule By Mouth Twice A Day. 2)  Protonix 40 Mg Solr (Pantoprazole Sodium) .... One Tablet By Mouth Once Daily 3)  Klonopin 0.5 Mg  Tabs (Clonazepam) .... Two Times A Day 4)  Crestor 5 Mg  Tabs (Rosuvastatin Calcium) .... Once Daily 5)  Metformin Hcl 500 Mg  Tabs (Metformin Hcl) .... One Tablet By Mouth Once Daily 6)  Maxair Autohaler 200 Mcg/inh Aerb (Pirbuterol Acetate) .... As Needed 7)  Pulmicort Flexhaler 180 Mcg/act Aepb (Budesonide) .... As Needed 8)  Vitamin D 1000 Unit Tabs (Cholecalciferol)  .... One Tablet By Mouth Two Times A Day 9)  Carafate 1 Gm/20ml Susp (Sucralfate) .... Take 1 Gram Between Meals and At Bedtime 10)  Bystolic 5 Mg Tabs (Nebivolol Hcl) .... Take One Daily 11)  Zometa 4 Mg/7ml Conc (Zoledronic Acid) .... Iv Every Year 12)  Faslodex 250 Mg/28ml Soln (Fulvestrant) .... 500 Mg Im Every Month 13)  Imodium A-D 1 Mg/7.84ml Liqd (Loperamide Hcl) .... As Needed 14)  Pepto-Bismol 524 Mg/37ml Susp (Bismuth Subsalicylate) .... As Needed 15)  Glimepiride 1 Mg Tabs (Glimepiride) .... One Tablet By Mouth in The Morning  Allergies (verified): 1)  ! Phenergan 2)  ! * Some Antibx - Gi Upset 3)  ! 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 Tubal ligation 1973 Nasal surgery 1975  LAMINECTOMYX1 HYSTERECTOMY L MASTECTOMY D & C Carpal Tunnel Release x2  Family History: Reviewed history from 02/25/2009 and no changes required. Family History of Prostate Cancer:2 brothers Family History of Irritable Bowel SyndromeMother No FH of Colon Cancer: Family History of Heart Disease: Father died of cva  Social History: Reviewed history from 02/25/2009 and no changes required. Patient has never smoked.  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  Review of Systems  The patient denies allergy/sinus, anemia, anxiety-new,  arthritis/joint pain, back pain, blood in urine, breast changes/lumps, change in vision, confusion, cough, coughing up blood, depression-new, fainting, fatigue, fever, headaches-new, hearing problems, heart murmur, heart rhythm changes, itching, menstrual pain, muscle pains/cramps,  night sweats, nosebleeds, pregnancy symptoms, shortness of breath, skin rash, sleeping problems, sore throat, swelling of feet/legs, swollen lymph glands, thirst - excessive , urination - excessive , urination changes/pain, urine leakage, vision changes, and voice change.         Pertinent positive and negative review of systems were noted in the above HPI. All other ROS was otherwise negative.   Vital Signs:  Patient profile:   75 year old female Height:      64 inches Weight:      154 pounds BMI:     26.53 BSA:     1.75 Pulse rate:   60 / minute Pulse rhythm:   regular BP sitting:   116 / 64  (left arm) Cuff size:   regular  Vitals Entered By: Ok Anis CMA (September 17, 2010 4:16 PM)  Physical Exam  General:  Well developed, well nourished, no acute distress. Eyes:  PERRLA, no icterus. Mouth:  No deformity or lesions, dentition normal. Lungs:  Clear throughout to auscultation. Heart:  Regular rate and rhythm; no murmurs, rubs,  or bruits. Abdomen:  mildly protuberant abdomen with normal active bowel sounds. No palpable mass or stool. Liver edge at costal margin, no mass. Rectal:  Hemoccult negative stool. Extremities:  No clubbing, cyanosis, edema or deformities noted. Skin:  Intact without significant lesions or rashes. Psych:  Alert and cooperative. Normal mood and affect.   Impression & Recommendations:  Problem # 1:  FLATULENCE-GAS-BLOATING (ICD-787.3) Patient has irritable bowel syndrome with alternating diarrhea and constipation. We need to rule out bacterial overgrowth or metformin-induced diarrhea. We also need to rule out diabetic visceral neuropathy. We will give her samples of a probiotic to take daily. She is to continue Bentyl 10 mg twice a day.  Problem # 2:  COLONIC POLYPS, ADENOMATOUS, HX OF (ICD-V12.72) Patient is scheduled for a colonoscopy for followup of colon polyps.  Other Orders: Colonoscopy (Colon)  Patient Instructions: 1)  You have been  scheduled for a coloscopy. Please follow written prep instructions that were given to you today at your visit.  2)  Please pick up your prescription for Miralax, Dulcolax and Reglan at the pharmacy. An electronic presription has already been sent. 3)  Continue Carafate. 4)  Continue taking Pepcid every morning and Nexium 40 mg every night. 5)  Copy sent to : Dr [name] 6)  The medication list was reviewed and reconciled.  All changed / newly prescribed medications were explained.  A complete medication list was provided to the patient / caregiver. Prescriptions: CARAFATE 1 GM TABS (SUCRALFATE) Take 1 tablet by mouth two times a day  #180 x 0   Entered by:   Lamona Curl CMA (AAMA)   Authorized by:   Hart Carwin MD   Signed by:   Lamona Curl CMA (AAMA) on 09/17/2010   Method used:   Electronically to        [facility]* (retail)       [address] [place], [place]  [address]       Ph: [phone]       Fax: [phone]   RxID:   [id number] DULCOLAX 5 MG  TBEC (BISACODYL) Day before procedure take 2 at 3pm and 2 at 8pm.  #  4 x 0   Entered by:   Lamona Curl CMA (AAMA)   Authorized by:   Hart Carwin MD   Signed by:   Lamona Curl CMA (AAMA) on 09/17/2010   Method used:   [facility] to        [facility]  [address] (retail)       [address]       [place], [place]  [address]       Ph: [phone] or [phone]       Fax: [phone]   RxID:   [id number] REGLAN 10 MG  TABS (METOCLOPRAMIDE HCL) As per prep instructions.  #2 x 0   Entered by:   Lamona Curl CMA (AAMA)   Authorized by:   Hart Carwin MD   Signed by:   Lamona Curl CMA (AAMA) on 09/17/2010   Method used:   [facility]        [facility]  [address] (retail)       [address]       [place], [place]  [address]       Ph: [phone] or [phone]       Fax: [phone]   RxID:   [id number] MIRALAX   POWD (POLYETHYLENE  GLYCOL 3350) As per prep  instructions.  #255gm x 0   Entered by:   Lamona Curl CMA (AAMA)   Authorized by:   Hart Carwin MD   Signed by:   Lamona Curl CMA (AAMA) on 09/17/2010   Method used:   [facility]        [facility]  [address] (retail)       [address]       [place], [place]  [address]       Ph: [phone] or [phone]       Fax: [phone]   RxID:   [id number]

## 2010-11-19 NOTE — Progress Notes (Signed)
Summary: med concern  Phone Note Call from Patient Call back at [phone]   Caller: Patient Call For: Dr. [name] Reason for Call: Talk to Nurse Summary of Call: pt states that pharmacy wont fill meds because they are "too risky" for her to take Initial call taken by: Vallarie Mare,  August 06, 2010 3:15 PM  Follow-up for Phone Call        Patient called to report that her pharmacy said "It is to risky to fill her Bentyl rx." Patient states changes in her medications are: Bystolic, Zometa, Fasoldex and Cefuroxime Axetil. Also, patient wants to know when her colonoscopy is due. Last colon 05/12/07. Dr. [name] , please advise. Follow-up by: Jesse Fall RN,  August 06, 2010 4:41 PM  Additional Follow-up for Phone Call Additional follow up Details #1::        Chart reviewed, she has been on Bentyl previously without side effects. I don's see any contrindication. Last colon 04/2007 by Dr Matthias Hughs. Recall colon 5 years 04/2012. Additional Follow-up by: Hart Carwin MD,  August 06, 2010 11:49 PM    New/Updated Medications: BYSTOLIC 5 MG TABS (NEBIVOLOL HCL) Take one daily ZOMETA 4 MG/5ML CONC (ZOLEDRONIC ACID) IV every year FASLODEX 250 MG/5ML SOLN (FULVESTRANT) 500 mg IM every month CEFUROXIME AXETIL 250 MG TABS (CEFUROXIME AXETIL) one every 12 hours  Appended Document: med concern Recall entered in [facility] for 02/2012.   Clinical Lists Changes  Medications: Rx of BENTYL 10 MG CAPS (DICYCLOMINE HCL) Take 1 capsule by mouth twice a day.;  #60 x 6;  Signed;  Entered by: Jesse Fall RN;  Authorized by: Hart Carwin MD;  Method used: Electronically to [facility]*, [address] [place], [place], [place]  [address], Ph: [phone], Fax: [phone] Observations: Added new observation of COLONNXTDUE: 02/2012 (08/07/2010 8:38)    Prescriptions: BENTYL 10 MG CAPS (DICYCLOMINE HCL) Take 1 capsule by mouth twice a day.  #60 x 6   Entered by:   Jesse Fall RN   Authorized by:   Hart Carwin MD  Signed by:   Jesse Fall RN on 08/07/2010   Method used:   Electronically to        [facility]* (retail)       [address] [place], [place]  [address]       Ph: [phone]       Fax: [phone]   RxID:   [id number]    Appended Document: med concern Message left for patient to call back.    Appended Document: med concern Pt notified that rx for Bentyl is okay to take per Dr. [name] and that rx has been resent to her pharmacy. Patient also notified that she will be due for a colon 04/2012 as per Dr. Regino Schultze recommendation.

## 2010-11-19 NOTE — Letter (Signed)
[facility]   Imported By: Lester [place] 12/29/2009 08:26:58  _____________________________________________________________________  External Attachment:    Type:   Image     Comment:   External Document

## 2010-11-19 NOTE — Letter (Signed)
Summary: [facility] Instructions  [facility]  [address] [place], [place] [address]   Phone: [phone]  Fax: [phone]       Jamie [name]    09-Sep-1936    MRN: [id number]       Procedure Day /Date: Tuesday 09/29/10     Arrival Time: 9:00 am     Procedure Time: 10:00 am     Location of Procedure:                    _x _  [facility] (4th Floor)  PREPARATION FOR COLONOSCOPY WITH MIRALAX  Starting 5 days prior to your procedure 09/24/10 do not eat nuts, seeds, popcorn, corn, beans, peas,  salads, or any raw vegetables.  Do not take any fiber supplements (e.g. Metamucil, Citrucel, and Benefiber). ____________________________________________________________________________________________________   THE DAY BEFORE YOUR PROCEDURE         DATE: 09/28/10 DAY: Monday  1   Drink clear liquids the entire day-NO SOLID FOOD  2   Do not drink anything colored red or purple.  Avoid juices with pulp.  No orange juice.  3   Drink at least 64 oz. (8 glasses) of fluid/clear liquids during the day to prevent dehydration and help the prep work efficiently.  CLEAR LIQUIDS INCLUDE: Water Jello Ice Popsicles Tea (sugar ok, no milk/cream) Powdered fruit flavored drinks Coffee (sugar ok, no milk/cream) Gatorade Juice: apple, white grape, white cranberry  Lemonade Clear bullion, consomm, broth Carbonated beverages (any kind) Strained chicken noodle soup Hard Candy  4   Mix the entire bottle of Miralax with 64 oz. of Gatorade/Powerade in the morning and put in the refrigerator to chill.  5   At 3:00 pm take 2 Dulcolax/Bisacodyl tablets.  6   At 4:30 pm take one Reglan/Metoclopramide tablet.  7  Starting at 5:00 pm drink one 8 oz glass of the Miralax mixture every 15-20 minutes until you have finished drinking the entire 64 oz.  You should finish drinking prep around 7:30 or 8:00 pm.  8   If you are nauseated, you may take the 2nd Reglan/Metoclopramide  tablet at 6:30 pm.        9    At 8:00 pm take 2 more DULCOLAX/Bisacodyl tablets.         THE DAY OF YOUR PROCEDURE      DATE:  09/29/10 DAY: Tuesday  You may drink clear liquids until 8:00 am  (2 HOURS BEFORE PROCEDURE).   MEDICATION INSTRUCTIONS  Unless otherwise instructed, you should take regular prescription medications with a small sip of water as early as possible the morning of your procedure.  Diabetic patients - see separate instructions.        OTHER INSTRUCTIONS  You will need a responsible adult at least 73 years of age to accompany you and drive you home.   This person must remain in the waiting room during your procedure.  Wear loose fitting clothing that is easily removed.  Leave jewelry and other valuables at home.  However, you may wish to bring a book to read or an iPod/MP3 player to listen to music as you wait for your procedure to start.  Remove all body piercing jewelry and leave at home.  Total time from sign-in until discharge is approximately 2-3 hours.  You should go home directly after your procedure and rest.  You can resume normal activities the day after your procedure.  The day of your procedure you  should not:   Drive   Make legal decisions   Operate machinery   Drink alcohol   Return to work  You will receive specific instructions about eating, activities and medications before you leave.   The above instructions have been reviewed and explained to me by   _______________________    I fully understand and can verbalize these instructions _____________________________ Date _______

## 2010-11-19 NOTE — Progress Notes (Signed)
Summary: TRIAGE  Phone Note Call from Patient Call back at Home Phone [phone]   Caller: Patient Call For: Aaren Krog Summary of Call: Patient has a lot of acid reflux and indigestion unable to eat because of pain in her esophagus and stomach. (meds not helping- Nexium,Bentyl, Carafate)  Initial call taken by: Tawni Levy,  November 21, 2009 12:20 PM  Follow-up for Phone Call        Pt. states she will need to be seen today or go to the ER. She will see [facility] today at 2pm. Follow-up by: Laureen Ochs LPN,  November 21, 2009 12:29 PM

## 2010-11-19 NOTE — Progress Notes (Signed)
Summary: Medication refill  Phone Note Call from Patient Call back at [phone]   Caller: Patient Call For: Dr. [name] Reason for Call: Refill Medication Summary of Call: Needs a refill on Dicyclomine.........[phone] [facility] Initial call taken by: Karna Christmas,  July 23, 2010 9:42 AM    Prescriptions: BENTYL 10 MG CAPS (DICYCLOMINE HCL) Take 1 capsule by mouth twice a day.  #180 x 0   Entered by:   Lamona Curl CMA (AAMA)   Authorized by:   Hart Carwin MD   Signed by:   Lamona Curl CMA (AAMA) on 07/23/2010   Method used:   Electronically to        [facility]* (retail)       [address] [place], [place]  [address]       Ph: [phone]       Fax: [phone]   RxID:   [id number]

## 2010-11-19 NOTE — Assessment & Plan Note (Signed)
Summary: SEVERE GERD              (DR.BRODIE PT.)          Jamie [name]   History of Present Illness Visit Type: Follow-up Visit Primary GI MD: Lina Sar MD Primary Provider: [name], [name] Chief Complaint: Pt states several months ago she has had increasing reflux symptoms and esophageal  discomfort and a knot in her throat. Pt states she has a choking feeling in her throat that goes to her stomach. She states she is has been taking Nexium, Bentyl and Carafate without improvement.  History of Present Illness:   75 YO FEMALE KNOWN TO DR. Juanda [name]  WITH HX OF GERD,IBS. SHE WAS DX WITH BREAST CANCER IN 2007 WITH LEFT MASTECTOMY,DECLINED CHEMO BUT HAS BEEN ON TAMOXIFEN. CT ABD/PELVIS  5/10 SHOWED DIFFUSE OSSEOUS DISEASE.SHE IS MAINTAINED ON NEXIUM 40 MG DAILY AND BENTYL TWICE DAILY,HAD BEEN DOING PRETTY WELL. SHE TOOK A COURSE OF AN ABX A WEEK AGO WHICH UPSET HER STOMACH TERRIBLY-HAD TO STOP IT. SHE THEN STARTED AMOXICILLIN YEASTERDAY. SHE HAD ONSET TUESDAY 2/1 WITH GASSY PRESSURE TYPE PAIN IN HER UPPER ABDOMEN PUSHING UP IN TO HER CHEST AND CAUSING HER TO BELCH. BELCHING RELIEVES THE PRESSURE THEN COMES RIIGHT BACK.FEELS LIKE A BIG GAS BUBBLE THAT WON'T MOVE. NO N/V. NO FEVER,SOME LOOSE STOOLS,. SHE HAS BEEN VERY UNCOMFORTABL E WITH THE GAS PAIN. SHE WENT BACK ON CARAFATE SHE HAD AT HOME-NO CHANGE SO FAR. DENIES ANY REAL DYSPHAGIA OR ODYNOPHAGIA. NO SOB,DIAPHORESIS ETC.    GI Review of Systems    Reports abdominal pain, acid reflux, belching, bloating, and  nausea.     Location of  Abdominal pain: chest.    Denies chest pain, dysphagia with liquids, dysphagia with solids, heartburn, loss of appetite, vomiting, vomiting blood, weight loss, and  weight gain.      Reports diarrhea.     Denies anal fissure, black tarry stools, change in bowel habit, constipation, diverticulosis, fecal incontinence, heme positive stool, hemorrhoids, irritable bowel syndrome, jaundice, light color stool, liver problems,  rectal bleeding, and  rectal pain.    Current Medications (verified): 1)  Bentyl 10 Mg Caps (Dicyclomine Hcl) .... Take 1 Capsule By Mouth Twice A Day. 2)  Carafate 1 Gm Tabs (Sucralfate) .... Prn 3)  Nexium 40 Mg Cpdr (Esomeprazole Magnesium) .... Take 1 Capsule By Mouth Every Night 4)  Klonopin 0.5 Mg  Tabs (Clonazepam) .... Two Times A Day 5)  Zebeta 5 Mg  Tabs (Bisoprolol Fumarate) .... Qd 6)  Tamoxifen Citrate 20 Mg  Tabs (Tamoxifen Citrate) .... Once Daily 7)  Crestor 5 Mg  Tabs (Rosuvastatin Calcium) .... Once Daily 8)  Metformin Hcl 500 Mg  Tabs (Metformin Hcl) .... One Tablet By Mouth Once Daily 9)  Maxair Autohaler 200 Mcg/inh Aerb (Pirbuterol Acetate) .... As Needed 10)  Pulmicort Flexhaler 180 Mcg/act Aepb (Budesonide) .... As Needed 11)  Amoxicillin 500 Mg Caps (Amoxicillin) .... One Tablet By Mouth Three Times A Day 12)  Vitamin D 1000 Unit Tabs (Cholecalciferol) .... One Tablet By Mouth Two Times A Day  Allergies (verified): 1)  ! Phenergan 2)  ! * Some Antibx - Gi Upset 3)  ! 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 Tubal ligation 1973 Nasal surgery 1975  LAMINECTOMYX1 HYSTERECTOMY L MASTECTOMY D & C Carpal Tunnel Release x2  Family History: Reviewed history from 02/25/2009 and no changes required. Family History of Prostate Cancer:2 brothers Family History of Irritable Bowel SyndromeMother No FH of Colon Cancer: Family History of Heart Disease: Father died of cva  Social History: Reviewed history from 02/25/2009 and no changes required. Patient has never smoked.  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  The patient denies allergy/sinus, anemia, anxiety-new, arthritis/joint pain, blood in urine, breast changes/lumps, change in vision, confusion, cough, coughing up blood, depression-new, fainting, fatigue, fever, headaches-new, hearing problems, heart murmur, heart rhythm changes, itching, menstrual pain, muscle pains/cramps, night sweats, nosebleeds, pregnancy symptoms, shortness of breath, skin rash, sleeping problems, sore throat, swelling of feet/legs, swollen lymph glands, thirst - excessive , urination - excessive , urination changes/pain, urine leakage, vision changes, and voice change.         ROS OTHERWISE AS IN HPI  Vital Signs:  Patient profile:   74 year old female Height:      64 inches Weight:      145.25 pounds BMI:     25.02 Pulse rate:   60 / minute Pulse rhythm:   regular BP sitting:   116 / 62  (right arm) Cuff size:   regular  Vitals Entered By: Christie Nottingham CMA Duncan Dull) (November 21, 2009 2:08 PM)  Physical Exam  General:  Well developed, well nourished, no acute distress. Head:  Normocephalic and atraumatic. Eyes:  PERRLA, no icterus. Lungs:  Clear throughout to auscultation. Heart:  Regular rate and rhythm; no murmurs, rubs,  or bruits. Abdomen:  SOFT, MILD TENDERNESS IN EPIGASTRIUM, NO DISTENTION, NO MASS OR PALP HSM,BS+ Rectal:  NOT DONE Extremities:  No clubbing, cyanosis, edema or deformities noted. Neurologic:  Alert and  oriented x4;  grossly normal neurologically. Psych:  Alert and cooperative. Normal mood and affect.   Impression & Recommendations:  Problem # 1:  ABDOMINAL PAIN-EPIGASTRIC (ICD-789.06) Assessment New 75 YO FEMALE WITH BREAST CANCER METASTAIC TO BONE  WITH HX OF GERD,IBS WITH ACUTE ONSET OF EPIGASTRIC DISCOMFORT,PERSISTENT GAS BELCHING,PRESSURE X 3 DAYS AFTER TAKING A COURSE OF ANTIBIOTIC ONE WEEK AGO. SUSPECT AN  ANTIBIOTIC INDUCED GASTRITIS,DYSPEPSIA. HOWEVER WITH HER HX MUST CONSIDER INTRA THORACICOR INTRAABDOMIAL SPEAD OF MALIGNANCY.  INCREASE NEXIUM TO 40 MG TWICE DAILY X 2 WEEKS SWITCH TO CARAFATE SLURRY 1 GM 4 TIMES DAILY BETWEEN MEALS CONTINUE BENTYL TWICE DAILY ADD GAS X BEFORE EACH MEAL BLAND LOW GASDIET PT HAS APPT IN 2 WEEKS TO SEE DR. MAGRINAT AND WILL HAVE LABS NEXT WEEK,SHE WILL INFORM HIM OF HER GI SXS AS WELL ADVISED HER TO CALL BACK IN 4-5 DAYS IF NO IMPROVEMENT.  Problem # 2:  PERSONAL HX BREAST CANCER (ICD-V10.3) BONE METS DIFFUSE  Problem # 3:  COLONIC POLYPS, ADENOMATOUS, HX OF (ICD-V12.72) Assessment: Comment Only LAST COLON 2008.  Problem # 4:  ANXIETY DISORDER (ICD-300.00) Assessment: Comment Only  Problem # 5:  DIABETES (ICD-250.00) Assessment: Comment Only  Problem # 6:  Hx of ASTHMA (ICD-493.90) Assessment: Comment Only  Patient Instructions: 1)  Use Gas X before meals 3 times daily. 2)  We sent perscription for Nexium to [facility]. 3)  We electronically sent perscription for Carafate suspension to [facility]. 4)  We have given you a Gas and Flatulence Prevention Diet brochure. 5)  Call us next week if symptoms don't improve. 6)  Copy sent to : Ruthann Cancer, MD 7)  The medication list was reviewed and reconciled.  All changed / newly prescribed medications were explained.  A complete medication list was provided to the patient / caregiver. Prescriptions: CARAFATE 1 GM/10ML SUSP (SUCRALFATE) Take 1 Gram between meals and at bedtime  #56 grams x 1   Entered by:   Lowry Ram NCMA   Authorized by:   Sammuel Cooper PA-c   Signed by:   Lowry Ram NCMA on 11/21/2009   Method used:   Electronically to        [facility] [address] [phone]* (retail)       [address]       [place], [place]  [address]       Ph: [phone]       Fax: [phone]   RxID:   [id number] NEXIUM 40 MG CPDR (ESOMEPRAZOLE MAGNESIUM) Take 1 capsule by mouth 30 min  before breakfast  #30 x 3   Entered by:   Lowry Ram NCMA   Authorized by:   Sammuel Cooper PA-c   Signed by:   Lowry Ram NCMA on 11/21/2009   Method used:   Electronically to        [facility]* (retail)       [address]       [place], [place]  [address]       Ph: [phone]       Fax: [phone]   RxID:   [id number]

## 2010-11-19 NOTE — Letter (Signed)
Summary: Diabetic Instructions  [facility]  [address] [place], [place] [address]   Phone: [phone]  Fax: [phone]    Jamie [name] 06-04-1936 MRN: [id number]   _  x_   ORAL DIABETIC MEDICATION INSTRUCTIONS  The day before your procedure:   Take your diabetic pill as you do normally  The day of your procedure:   Do not take your diabetic pill    We will check your blood sugar levels during the admission process and again in Recovery before discharging you home  ________________________________________________________________________

## 2010-11-19 NOTE — Progress Notes (Signed)
Summary: TRIAGE  Phone Note Call from Patient Call back at Home Phone [phone]   Caller: Patient Call For: Dr. [name] Reason for Call: Talk to Nurse Summary of Call: pt reports diarrhea after eating and would like to be seen. Initial call taken by: Vallarie Mare,  December 09, 2009 3:31 PM  Follow-up for Phone Call        Last OV 11-21-09. Pt. states she has the same problems as when she was seen last. Takes Bentyl once daily, Nexium twice daily, Carafate two times a day. States she has episodes of loose stools and abd. cramping.   1) Take Bentyl two times a day, as directed. 2) Carafate QID, as directed 3) Immodium as needed for diarrhea. 4) Soft,bland diet. No spicy,greasy,fried foods.  5) See Dr.Tavio Biegel on 12-29-09 at 8:45am 6) If symptoms become worse call back immediately.  Follow-up by: Laureen Ochs LPN,  December 09, 2009 3:53 PM  Additional Follow-up for Phone Call Additional follow up Details #1::        I agree with the above and also reduce Nexium to once a day in case it is contributing to the diarrhea. She may increase her Bentyl to three times a day till she sees me. Additional Follow-up by: Hart Carwin MD,  December 09, 2009 8:08 PM    Additional Follow-up for Phone Call Additional follow up Details #2::    Above MD orders reviewed with patient. Pt. instructed to call back as needed.  Follow-up by: Laureen Ochs LPN,  December 10, 2009 8:14 AM

## 2010-11-19 NOTE — Letter (Signed)
Summary: [facility]  [facility]   Imported By: Sherian Rein 07/06/2010 07:36:59  _____________________________________________________________________  External Attachment:    Type:   Image     Comment:   External Document

## 2010-11-19 NOTE — Progress Notes (Signed)
Summary: Refill  Phone Note Call from Patient Call back at Home Phone [phone]   Caller: Patient Call For: Dr. [name] Reason for Call: Refill Medication Details for Reason: Refill Summary of Call: Pt. called to state she can't get a refill until she has an appt.  Scheduled 10/26/10.  She has about 18 pills left of her sucralfate. Gets her meds from [facility], fax # [phone]. Initial call taken by: Schuyler Amor,  September 16, 2010 8:43 AM  Follow-up for Phone Call        Dr [name]- Patient requests refills on carafate tablets...however, looks like last prescription we gave was 11/2009 and current prescription just says "take as needed". Do you want me to go ahead and send 3 month prescription like she has requested and if so, how do you want her to take it? Follow-up by: Lamona Curl CMA Duncan Dull),  September 16, 2010 9:17 AM  Additional Follow-up for Phone Call Additional follow up Details #1::        I have reviewed her record,  hx of metastatic breast Ca, last EGD 2008 by Dr Matthias Hughs, "gastritis" treated by Amy  successfully with Carafate 1gm two times a day, # 60, 1 refill, also Nexium 40 mg by mouth once daily (hs), she ought to be taking it. Also we added Pepcid 40mg   am. please refill till I can see her in 10/2010 Additional Follow-up by: Hart Carwin MD,  September 16, 2010 9:59 PM    Additional Follow-up for Phone Call Additional follow up Details #2::    Called patient to explain Dr Regino Schultze recommendations to patient. She now complains that she has been having several weeks of diarrhea and abdominal pain. After review or records, looks like patient has a hx of ischemic colitis. She will come in to be seen today at 3:30 pm by Dr [name]. Follow-up by: Lamona Curl CMA Duncan Dull),  September 17, 2010 9:18 AM

## 2010-11-19 NOTE — Letter (Signed)
[facility]   Imported By: Sherian Rein 03/26/2010 14:03:44  _____________________________________________________________________  External Attachment:    Type:   Image     Comment:   External Document

## 2010-11-25 NOTE — Procedures (Signed)
  Jamie [name], Jamie [name]              ACCOUNT NO.:  [id number]  MEDICAL RECORD NO.:  [id number]          PATIENT TYPE:  OIB  LOCATION:  1959                         FACILITY:  [facility]  PHYSICIAN:  Georga Hacking, [name].DATE OF BIRTH:  [birth year]  DATE OF PROCEDURE:  11/05/2010                            CARDIAC CATHETERIZATION   HISTORY:  A 75 year old female with a history of diabetes, hypertension, and some mitral regurgitation.  She has had significant chest discomfort and some neck pain and was seen first by an ENT doctor, later by her regular physician, and has had exertional exercise tolerance and diaphoresis.  She had a treadmill test showing 1 mm of ST depression,  PROCEDURE:  Left heart catheterization with coronary angiogram and left ventriculogram.  PROCEDURE IN DETAIL:  Procedure was done in the outpatient diagnostic laboratory.  Right femoral artery was entered with difficulty but with only 1 anterior needle wall stick due to the fact the artery would roll around a good bit.  A 4-French sheath was placed right femoral percutaneous and angiogram was made using 6-French catheters.  A 30 mL ventriculogram was performed.  She was turned to the holding area to complete sheath removal.  HEMODYNAMIC DATA:  Aortic postcontrast 162/67, LV postcontrast 162/10- 20.  ANGIOGRAPHIC DATA:  Left ventriculogram:  Performed in the 30 degree RAO projection.  The aortic valve is normal.  Mitral valve is normal.  Left ventricle is normal in size.  Ventricle is hyperdynamic with an estimated ejection fraction of 70-75%.  Coronary arteries arise and distribute normally.  No significant cardiac calcification noted.  This system is right-dominant.  The left main coronary artery is normal.  The left anterior descending has a mild 10-20% proximal stenosis noted.  The vessel is mildly tortuous but contains no significant disease. Circumflex coronary artery has 2 marginal branches which are  moderately tortuous, contains no significant disease.  Right coronary artery is a dominant vessel that contains no significant disease.  IMPRESSION: 1. Very mild atherosclerotic coronary disease involving the proximal     left anterior descending but no significant     focal obstructive stenoses noted. 2. Normal left ventricular function.  RECOMMENDATIONS:  Continued medical therapy.     Georga Hacking, [name].     WST/MEDQ  D:  11/05/2010  T:  11/05/2010  Job:  [id number]  cc:   Gwen Pounds, MD  Electronically Signed by Lacretia Nicks. Donnie Aho [name]. on 11/25/2010 09:23:31 AM

## 2010-12-03 ENCOUNTER — Encounter (HOSPITAL_BASED_OUTPATIENT_CLINIC_OR_DEPARTMENT_OTHER): Payer: Medicare HMO | Admitting: Oncology

## 2010-12-03 ENCOUNTER — Other Ambulatory Visit: Payer: Self-pay | Admitting: Oncology

## 2010-12-03 DIAGNOSIS — M899 Disorder of bone, unspecified: Secondary | ICD-10-CM

## 2010-12-03 DIAGNOSIS — Z170 Estrogen receptor positive status [ER+]: Secondary | ICD-10-CM

## 2010-12-03 LAB — CBC WITH DIFFERENTIAL/PLATELET
BASO%: 0.4 % (ref 0.0–2.0)
HCT: 33.4 % — ABNORMAL LOW (ref 34.8–46.6)
MCHC: 34.7 g/dL (ref 31.5–36.0)
MONO#: 0.4 10*3/uL (ref 0.1–0.9)
NEUT#: 2.5 10*3/uL (ref 1.5–6.5)
NEUT%: 57.2 % (ref 38.4–76.8)
RBC: 3.49 10*6/uL — ABNORMAL LOW (ref 3.70–5.45)
WBC: 4.4 10*3/uL (ref 3.9–10.3)
lymph#: 1.4 10*3/uL (ref 0.9–3.3)

## 2010-12-03 LAB — COMPREHENSIVE METABOLIC PANEL
ALT: 13 U/L (ref 0–35)
Alkaline Phosphatase: 84 U/L (ref 39–117)
Creatinine, Ser: 0.7 mg/dL (ref 0.40–1.20)
Sodium: 140 mEq/L (ref 135–145)
Total Bilirubin: 0.2 mg/dL — ABNORMAL LOW (ref 0.3–1.2)
Total Protein: 6.2 g/dL (ref 6.0–8.3)

## 2010-12-03 LAB — CANCER ANTIGEN 27.29: CA 27.29: 48 U/mL — ABNORMAL HIGH (ref 0–39)

## 2010-12-10 ENCOUNTER — Encounter (HOSPITAL_BASED_OUTPATIENT_CLINIC_OR_DEPARTMENT_OTHER): Payer: Medicare HMO | Admitting: Oncology

## 2010-12-10 DIAGNOSIS — C50919 Malignant neoplasm of unspecified site of unspecified female breast: Secondary | ICD-10-CM

## 2010-12-10 DIAGNOSIS — Z5111 Encounter for antineoplastic chemotherapy: Secondary | ICD-10-CM

## 2010-12-10 DIAGNOSIS — M899 Disorder of bone, unspecified: Secondary | ICD-10-CM

## 2011-01-07 ENCOUNTER — Other Ambulatory Visit: Payer: Self-pay | Admitting: Oncology

## 2011-01-07 ENCOUNTER — Encounter (HOSPITAL_BASED_OUTPATIENT_CLINIC_OR_DEPARTMENT_OTHER): Payer: Medicare HMO | Admitting: Oncology

## 2011-01-07 DIAGNOSIS — M899 Disorder of bone, unspecified: Secondary | ICD-10-CM

## 2011-01-07 DIAGNOSIS — C50919 Malignant neoplasm of unspecified site of unspecified female breast: Secondary | ICD-10-CM

## 2011-01-07 DIAGNOSIS — Z5111 Encounter for antineoplastic chemotherapy: Secondary | ICD-10-CM

## 2011-01-07 LAB — CBC WITH DIFFERENTIAL/PLATELET
Basophils Absolute: 0 10*3/uL (ref 0.0–0.1)
Eosinophils Absolute: 0.2 10*3/uL (ref 0.0–0.5)
HGB: 12.2 g/dL (ref 11.6–15.9)
MCV: 93.1 fL (ref 79.5–101.0)
MONO#: 0.5 10*3/uL (ref 0.1–0.9)
MONO%: 9.1 % (ref 0.0–14.0)
NEUT#: 3.1 10*3/uL (ref 1.5–6.5)
RBC: 3.77 10*6/uL (ref 3.70–5.45)
RDW: 12.2 % (ref 11.2–14.5)
WBC: 5 10*3/uL (ref 3.9–10.3)
lymph#: 1.2 10*3/uL (ref 0.9–3.3)
nRBC: 0 % (ref 0–0)

## 2011-01-07 LAB — COMPREHENSIVE METABOLIC PANEL
ALT: 16 U/L (ref 0–35)
Albumin: 4.2 g/dL (ref 3.5–5.2)
CO2: 21 mEq/L (ref 19–32)
Chloride: 103 mEq/L (ref 96–112)
Glucose, Bld: 105 mg/dL — ABNORMAL HIGH (ref 70–99)
Potassium: 4 mEq/L (ref 3.5–5.3)
Sodium: 138 mEq/L (ref 135–145)
Total Bilirubin: 0.3 mg/dL (ref 0.3–1.2)
Total Protein: 6.7 g/dL (ref 6.0–8.3)

## 2011-01-07 LAB — CANCER ANTIGEN 27.29: CA 27.29: 50 U/mL — ABNORMAL HIGH (ref 0–39)

## 2011-01-20 ENCOUNTER — Other Ambulatory Visit: Payer: Self-pay | Admitting: Internal Medicine

## 2011-01-20 MED ORDER — DICYCLOMINE HCL 10 MG PO CAPS: ORAL_CAPSULE | ORAL | Status: DC

## 2011-01-20 NOTE — Telephone Encounter (Signed)
Patient advised that we never got any request from [facility] for Bentyl. She states that she thinks she may have called the wrong Dr's office about this. I have explained that we will send her a 3 month supply but that she needs to reschedule her colonoscopy as it was cancelled and never rescheduled in 09/2010. Patient states that she does not have the $200 to pay. I have asked her to contact our billing department to come up with a plan so she can have her colonoscopy as it is very important that she get that done. Patient verbalizes understanding.

## 2011-01-22 ENCOUNTER — Other Ambulatory Visit: Payer: Self-pay | Admitting: Oncology

## 2011-01-22 DIAGNOSIS — C50919 Malignant neoplasm of unspecified site of unspecified female breast: Secondary | ICD-10-CM

## 2011-02-04 ENCOUNTER — Encounter (HOSPITAL_BASED_OUTPATIENT_CLINIC_OR_DEPARTMENT_OTHER): Payer: Medicare HMO | Admitting: Oncology

## 2011-02-04 ENCOUNTER — Other Ambulatory Visit: Payer: Self-pay | Admitting: Oncology

## 2011-02-04 DIAGNOSIS — C50919 Malignant neoplasm of unspecified site of unspecified female breast: Secondary | ICD-10-CM

## 2011-02-04 DIAGNOSIS — Z5111 Encounter for antineoplastic chemotherapy: Secondary | ICD-10-CM

## 2011-02-04 LAB — CBC WITH DIFFERENTIAL/PLATELET
Basophils Absolute: 0 10*3/uL (ref 0.0–0.1)
EOS%: 0.5 % (ref 0.0–7.0)
HCT: 37 % (ref 34.8–46.6)
HGB: 12.8 g/dL (ref 11.6–15.9)
MCH: 33.1 pg (ref 25.1–34.0)
MCV: 95.9 fL (ref 79.5–101.0)
NEUT%: 63.9 % (ref 38.4–76.8)
Platelets: 275 10*3/uL (ref 145–400)
lymph#: 2.3 10*3/uL (ref 0.9–3.3)

## 2011-02-04 LAB — COMPREHENSIVE METABOLIC PANEL
AST: 19 U/L (ref 0–37)
BUN: 13 mg/dL (ref 6–23)
Calcium: 9.6 mg/dL (ref 8.4–10.5)
Chloride: 101 mEq/L (ref 96–112)
Creatinine, Ser: 0.86 mg/dL (ref 0.40–1.20)

## 2011-02-04 LAB — CANCER ANTIGEN 27.29: CA 27.29: 53 U/mL — ABNORMAL HIGH (ref 0–39)

## 2011-02-09 ENCOUNTER — Telehealth: Payer: Self-pay | Admitting: Internal Medicine

## 2011-02-09 NOTE — Telephone Encounter (Signed)
Patient is ready to reschedule her colonoscopy that she canceled in Decemeber.  Jamie [name] is going to help her reschedule.

## 2011-02-09 NOTE — Telephone Encounter (Signed)
Left message for patient to call back  

## 2011-02-24 ENCOUNTER — Inpatient Hospital Stay (HOSPITAL_COMMUNITY): Admission: RE | Admit: 2011-02-24 | Payer: Medicare HMO | Source: Ambulatory Visit

## 2011-02-24 ENCOUNTER — Ambulatory Visit (HOSPITAL_COMMUNITY): Payer: Medicare HMO

## 2011-02-24 ENCOUNTER — Encounter (HOSPITAL_COMMUNITY): Payer: Medicare HMO

## 2011-03-02 NOTE — Assessment & Plan Note (Signed)
[facility]                         GASTROENTEROLOGY OFFICE NOTE   NAME:Jamie [name], Jamie [name]                     MRN:          [id number]  DATE:11/29/2007                            DOB:          [birth year]    Ms. Jamie [name] is [name] 75 year old white female, who is here to establish for  new GI care.  She has been followed by Dr. Matthias Hughs for at least 15 years  and prior to that, by Dr. Victorino Dike.  Last appointment with Dr.  Matthias Hughs was last summer, when she had endoscopy and colonoscopy.  She  has had irritable bowel syndrome.  Her symptoms date back to when she  was 75 years old.  She has episodes of nausea, vomiting, bloating, crampy  lower abdominal pain.  She has been treated for gastroesophageal reflux  by Dr. Lucie Leather, who suggested that she double up on Nexium to twice-[name]-day  dose, but her insurance does not allow her to take it twice [name] day, so  she takes one in the morning.  She has head-of-the-bed elevation at  night.  Most of her sickness occurs at night.  She may sometimes spend  most of the night sitting up.  Her bowel habits are usually regular,  having one bowel movement [name] day, but during the episodes of flare-up,  she may have diarrhea or constipation.  She has [name] lot of gas and  burping.  She has had [name] lot of stress recently.  Her son died in  01/02/09and her husband just had coronary bypass surgery, but is  doing well.  She has lost about 15 pounds in the last year, partially  intentionally.   MEDICATIONS:  1. Nexium 40 mg daily.  2. Zetia 5 mg daily.  3. Crestor 5 mg daily.  4. Clonazepam 0.5 mg b.i.d.  5. Tamoxifen 20 mg daily.  6. Metformin 500 mg b.i.d.   PAST HISTORY:  Significant for asthmatic bronchitis, followed by Dr.  Lucie Leather since 2001.  Diabetes, type 2, since 2000.  Thyroid problem since  1960s.  She has anxiety, panic disorder and depression.  She had breast  cancer in 2007, followed by Dr. Darnelle Catalan.  Kidney stones in  1982.  She  has allergies and sinus trouble.  Operations include hysterectomy in  1995, tubal ligation in 1976, had breast surgery in 2007.   FAMILY HISTORY:  Negative for colon cancer.  Positive for ulcerative  colitis in her daughter.  Alcoholism in brother.  Grandmother had breast  cancer.   SOCIAL HISTORY:  Married with one child.  She does not smoke, does not  drink alcohol.   REVIEW OF SYSTEMS:  Positive for allergies, excessive thirst, back pain,  anxiety and depression.   PHYSICAL EXAM:  Blood pressure 108/80, pulse 56 and weight 141 pounds.  She was alert and oriented, in no distress.  She appears somewhat  anxious, but she was very cooperative.  Sclera is anicteric.  NECK:  Supple, no adenopathy.  LUNGS:  Clear to auscultation.  COR:  With normal S1, normal S2.  ABDOMEN:  Soft and relaxed, nontender with normoactive bowel  sounds, no  tympany.  Liver edge about 1-2 cm below right costal margin.  I could  not appreciate any tenderness in the right upper quadrant.  No CVA  tenderness.  RECTAL EXAM:  Normal rectal tone.  There was soft, brown, Hemoccult-  positive stool.  EXTREMITIES:  No edema.   IMPRESSION:  1. Patient is [name] 75 year old white female with longstanding irritable      bowel syndrome, followed by Dr. Matthias Hughs. with longstanding irritable      bowel syndrome, followed by Dr. Matthias Hughs.  2. History of adenomatous polyp of the colon as per last colonoscopy      in July 2008.  3. History of gastroesophageal reflux disease, controlled on PPIs with      occasional break-through symptoms.  4. Breast cancer, history of, followed by Dr. Darnelle Catalan.  5. Hemoccult-positive stool on today's exam.   PLAN:  1. Repeat stool Hemoccult.  2. Consider small bowel capsule endoscopy.  3. Patient needs refill on her medications, Carafate and Nexium.  She      will also add omeprazole or Pepcid in the morning, while the Nexium      would be advise to be taken at night.  She will continue on      antireflux measures.  We will give her Hemoccult cards  today and,      if they are positive, she may need further GI evaluation.  We will      also find out the blood test results from Dr. Darnelle Catalan.     Hedwig Morton. [name], MD  Electronically Signed    DMB/MedQ  DD: 11/29/2007  DT: 11/30/2007  Job #: [id number]   cc:   Gwen Pounds, MD  Valentino Hue Magrinat, [name].

## 2011-03-04 ENCOUNTER — Other Ambulatory Visit: Payer: Self-pay | Admitting: Oncology

## 2011-03-04 ENCOUNTER — Encounter (HOSPITAL_BASED_OUTPATIENT_CLINIC_OR_DEPARTMENT_OTHER): Payer: Medicare HMO | Admitting: Oncology

## 2011-03-04 DIAGNOSIS — C50919 Malignant neoplasm of unspecified site of unspecified female breast: Secondary | ICD-10-CM

## 2011-03-04 DIAGNOSIS — Z5111 Encounter for antineoplastic chemotherapy: Secondary | ICD-10-CM

## 2011-03-04 DIAGNOSIS — R197 Diarrhea, unspecified: Secondary | ICD-10-CM

## 2011-03-04 DIAGNOSIS — C7931 Secondary malignant neoplasm of brain: Secondary | ICD-10-CM

## 2011-03-04 LAB — CBC WITH DIFFERENTIAL/PLATELET
Basophils Absolute: 0 10*3/uL (ref 0.0–0.1)
Eosinophils Absolute: 0.1 10*3/uL (ref 0.0–0.5)
HCT: 35.8 % (ref 34.8–46.6)
HGB: 12.4 g/dL (ref 11.6–15.9)
MCH: 32.8 pg (ref 25.1–34.0)
MONO#: 0.4 10*3/uL (ref 0.1–0.9)
NEUT%: 59.3 % (ref 38.4–76.8)
WBC: 4.5 10*3/uL (ref 3.9–10.3)
lymph#: 1.3 10*3/uL (ref 0.9–3.3)

## 2011-03-04 LAB — COMPREHENSIVE METABOLIC PANEL
ALT: 12 U/L (ref 0–35)
AST: 15 U/L (ref 0–37)
Albumin: 4.1 g/dL (ref 3.5–5.2)
Alkaline Phosphatase: 70 U/L (ref 39–117)
Chloride: 103 mEq/L (ref 96–112)
Potassium: 3.6 mEq/L (ref 3.5–5.3)
Sodium: 141 mEq/L (ref 135–145)
Total Protein: 6.5 g/dL (ref 6.0–8.3)

## 2011-03-05 NOTE — Consult Note (Signed)
NAME:  Jamie [name], Jamie [name]                        ACCOUNT NO.:  [id number]   MEDICAL RECORD NO.:  [id number]                   PATIENT TYPE:  INP   LOCATION:  2033                                 FACILITY:  [facility]   PHYSICIAN:  W. Ashley Royalty., [name].         DATE OF BIRTH:  1936-07-08   DATE OF CONSULTATION:  05/19/2004  DATE OF DISCHARGE:                                   CONSULTATION   Thank you for asking me to see this 75 year old female for evaluation of  chest pain.  The patient has [name] very complicated past medical history.  She  has [name] previous history of chest discomfort and has mild mitral  regurgitation.  She has had chronic PACs, PVCs, and has had significant  asthma in the past.  Dr. Amil Amen has seen her in the past and has previously  tried to put her on beta blockers which reportedly worsened bronchospasm.  She has later been tried on Verapamil and was on Ziac which she tried to  discontinue but had chest pain and palpitations when she would discontinue  it.  She has later wound up on Zebeta and when she attempts to come off it  would have chest pain and palpitations.  She also complained of significant  fatigue on the Zebeta previously but has taken it.  She has significant  difficulty with chest tightness and heaviness with exertion and also has  chronic asthma.  She has [name] history of glucose intolerance.  She has [name]  diagnosis of Meniere's syndrome evaluated by Dr. Meryl Crutch and he has her on  Lotrel for this.  She has severe situational stress related to [name] son with  muscular dystrophy at home and has remarried and is under significant  situational stress with her current husband.  She has [name] previous diagnosis  of mitral valve prolapse given by another cardiologist several years ago,  however, an echocardiogram in March 2004 showed only mitral regurgitation  but no prolapse.  She had [name] negative stress Cardiolite study one year ago.  She had noted increased situational  stress and had also noted some heaviness  and difficulty breathing when the weather was so hot one week ago.  She,  otherwise, had been in her usual state of health without problems.  She was  concerned over the costs of her medications and called Dr. Laverle Hobby  office and found he was on vacation.  The covering doctor told her that she  could change to Toprol and she took her first dose this morning and about 20  minutes later had the onset of chest pain described as left upper chest pain  which became sharp and was worse when she would get up and move around and  be better when she was sitting.  She then was seen at Dr. Newell Coral office  and he felt she should be admitted to the hospital to rule out [name] myocardial  infarction.  There  is [name] definite temporal relationship to the  discontinuation of Zebeta and the timing of the dose of the other  medications.  She denies any PND, orthopnea, edema, or significant syncope.   PAST MEDICAL HISTORY:  Her past history is remarkable for asthma, glucose  intolerance, [name] history of Meniere's syndrome, severe esophageal reflux, and  severe anxiety and panic attacks.   PAST SURGICAL HISTORY:  Cyst removed from the spine, hysterectomy, nose  surgery x 2.  She had back surgery in 1994.  She has had [name] tubal ligation.  She has had [name] D&C x 2.   ALLERGIES:  She is intolerant to aspirin and also to adrenalin and  erythromycin.   CURRENT MEDICATIONS:  HCTZ 12.5 mg daily, Klonopin 0.5 mg in the morning, 1  mg in the evening, Lotrel 5/10 mg daily, Singular 5 mg daily, Zebeta 5 mg  daily, Prevacid 30 mg daily, Premarin 0.3 mg daily, Lipitor 10 mg daily.   FAMILY HISTORY:  Father died of [name] stroke at age 59.  Mother died of [name] stroke  at age 37.  Two brothers have cancer of the prostate and [name] third brother has  COPD and is an alcoholic.   SOCIAL HISTORY:  She has been divorced and remarried to her current husband  for 11 years.  She has never smoked and does  not use alcohol.  She is  currently unemployed but previously worked as [name] Geologist, engineering at [employer].  She currently lives with her husband.  She also has [name] son who has muscular  dystrophy for whom she is [name] full time caregiver.   REVIEW OF SYMPTOMS:  She has had [name] weight gain of 7 pounds.  She has chronic  malaise and fatigue and also chronic situational stress.  SKIN:  No rashes  or lesions.  EYES:  Wears eyeglasses and contacts, no glaucoma, macular  degeneration but she may have early cataracts.  EARS, NOSE AND THROAT:  Denies any hearing loss, epistaxis, hoarseness, or difficulty speaking.  RESPIRATORY:  She has [name] history of asthma and did note some difficulty  breathing during the hot weather several weeks ago.  CARDIOVASCULAR:  Please  review the history of present illness.  She has chronic dyspepsia, has [name]  known hiatal hernia, and [name] history of reflux esophagitis but no  history of  GI bleeding.  GU:  No dysuria, urgency, frequency, UTIs, or stress  incontinence.  MUSCULOSKELETAL:  She has [name] history of fibromyalgia but is  not currently taking any active treatment for this.  NEUROLOGIC:  She has [name]  diagnosis of Meniere's disease.  She tends to eat [name] high salt diet at times  and states salt will cause dizziness at times.  She is seen by Dr. Meryl Crutch  for this.  She has significant situational stress related to both her son  and her husband and is chronically anxious.  HEMATOLOGIC:  Denies bleeding  disorders or food allergies.   PHYSICAL EXAMINATION:  GENERAL:  She is [name] pleasant female who is currently in no acute distress.  VITAL SIGNS:  Blood pressure currently 115/70 sitting, pulse 70 and regular.  SKIN:  Warm and dry without obvious mass or lesions.  HEENT:  Fundi unremarkable.  Pharynx negative.  NECK:  Supple without masses, JVD, thyromegaly, or bruits.  LUNGS:  Clear to [name]&P, no wheezing is heard.  CARDIOVASCULAR:  Normal S1 and S2, very faint 1/6 systolic murmur at the apex.   There is no S3 and  no diastolic murmur.  ABDOMEN:  Mid epigastric tenderness without rebound, bowel sounds present,  no masses are noted.  EXTREMITIES:  Femoral pulses are 2+, peripheral pulses are 1-2+, there is no  peripheral edema noted.  NEUROLOGICAL:  Normal.  GU AND RECTAL:  Deferred due to cardiac condition.   LABORATORY DATA:  Hemoglobin 12.9, hematocrit 37, platelet count 282,000.  Potassium 3.6, glucose 165.  BUN 11, creatinine 0.6.  Liver enzymes were  normal.  Initial cardiac markers were negative with [name] normal MB and [name]  negative troponin.  EKG is not available for review at the time of  dictation.   IMPRESSION:  1. Chest pain with atypical features.  The pain has tended to come and go     and occasionally will be in the left arm.  Her EKG is completely normal.     I suspect that this is not cardiac chest pain.  She has [name] long history of     atypical chest pain previously and there is [name] definite temporal     relationship to the discontinuation of Zebeta.  2. Glucose intolerance with random glucose of 165.  3. Hyperlipidemia currently under treatment.  4. History of Meniere's disease.  5. History of panic disorder and anxiety.  6. History of PACs and PVCs.  7. History of mild mitral regurgitation without evidence of mitral valve     prolapse on the most recent echo.   RECOMMENDATIONS:  I suspect that this is not [name] cardiac problem historically.  She had [name] complete workup with an echo and [name] stress test about [name] year ago.  With the temporal relation to discontinuation of the Zebeta and the fact  that she felt well yesterday and was intolerant to beta blockers previously  of other types, I would recommend stopping Toprol and restarting her Zebeta.  I would watch her overnight and check serial enzymes.  If her EKG remains  unremarkable and her enzymes are negative, I would tend to let her go home  and see how things went.  I would treat her intensively for reflux.  If she   continued to have symptoms, I am not really certainly that noninvasive tests  would be helpful for [place] and we would probably just go to catheterization,  however, I really do not think that [name] lot of this is cardiac and think that  [name] period of observation would not be unreasonable.   Thank you for asking me to see this nice woman with you.                                               Darden Palmer., [name].    WST/MEDQ  D:  05/19/2004  T:  05/19/2004  Job:  [id number]   cc:   Hal T. Stoneking, [name].  [address] [place], [place] [address]  Fax: [phone]   Melissa L. Ladona Ridgel, MD  [address] [place], [place] [address]  Fax: [phone]

## 2011-03-05 NOTE — Consult Note (Signed)
Jamie [name], Jamie [name]              ACCOUNT NO.:  [id number]   MEDICAL RECORD NO.:  [id number]          PATIENT TYPE:  EMS   LOCATION:  MAJO                         FACILITY:  [facility]   PHYSICIAN:  John C. Madilyn Fireman, [name].    DATE OF BIRTH:  1936-08-22   DATE OF CONSULTATION:  08/29/2005  DATE OF DISCHARGE:                                   CONSULTATION   HISTORY OF PRESENT ILLNESS:  The patient is a 75 year old white female who  presents with onset of nausea, diarrhea, sweatiness 2 nights ago with  persistent left-sided abdominal pain that has persisted until today. After  her initial diarrhea she felt like she needed to go to the bathroom several  hours later, used a suppository and has some results but has not had any  bowel movement in the last day and a half.  She has had general malaise but  no fever or chills and has not had any rectal bleeding. She did have a bout  of ischemic colitis in 2005. I had her come to the emergency room for  further assessment.   PAST MEDICAL HISTORY:  1.  Fibromyalgia.  2.  History of a kidney stone.  3.  Hypertension.  4.  Hiatal hernia.   MEDICATIONS:  Klonopin, Nexium, Premarin, Lotrel, Lipitor, Singulair,  hydrochlorothiazide.   PHYSICAL EXAMINATION:  VITAL SIGNS:  Temperature 97.4, pulse 64, blood  pressure 125/63.  HEART:  Regular rate and rhythm without murmur.  LUNGS:  Clear.  ABDOMEN:  Soft, nondistended with normal active bowel sounds, no  hepatosplenomegaly, mass or guarding.   LABORATORY DATA:  CBC, C-MET essentially unremarkable. KUB is also  essentially unremarkable, there is a moderate amount of stool seen  throughout the colon and several phleboliths.   IMPRESSION:  Abdominal pain and change in bowel habits possibly representing  a gastroenteritis. Does not appear to be ischemic colitis at this point.   PLAN:  For now will simply have her try small doses of magnesium citrate to  stimulate a bowel movement since she has not had  one in a day and a half.  She is told to call if she develops rectal bleeding, abdominal pain, or any  other worsening symptoms. She is to call for an update tomorrow.           ______________________________  Everardo All Madilyn Fireman, [name].    JCH/MEDQ  D:  08/29/2005  T:  08/29/2005  Job:  [id number]   cc:   Bernette Redbird, [name].  Fax: [phone]

## 2011-03-05 NOTE — Cardiovascular Report (Signed)
Jamie [name], Jamie [name]              ACCOUNT NO.:  [id number]   MEDICAL RECORD NO.:  [id number]          PATIENT TYPE:  OIB   LOCATION:  6501                         FACILITY:  [facility]   PHYSICIAN:  W. Ashley Royalty., [name].DATE OF BIRTH:  1936/08/16   DATE OF PROCEDURE:  08/07/2004  DATE OF DISCHARGE:                              CARDIAC CATHETERIZATION   HISTORY:  [age]-year-old female who has had a negative Cardiolite test  as well as echocardiogram in the past, but has had recurrent chest pain with  hospitalizations.  She is admitted at this time for elective outpatient  catheterization.   PROCEDURE:  Left heart catheterization with coronary angiograms and left  ventriculogram.   COMMENTS ABOUT PROCEDURE:  The patient tolerated the procedure well.  The  right femoral artery was entered with some difficulty because the artery was  somewhat mobile, but was only entered once with a single anterior needle  wall stick, and she tolerated the procedure well.   HEMODYNAMIC DATA:  Aortic post contrast 128/56, LV post contract 129/4-15.   ANGIOGRAPHIC DATA:   LEFT VENTRICULOGRAM:  Left ventriculogram performed in the 30-degree RAO  projection.  The aortic valve was normal.  The mitral valve was normal.  The  left ventricle appears normal in size.  The estimated ejection fraction was  60%.  Coronaries arise and distribute normally.  No significant coronary  calcification is noted.  The left main coronary artery is normal.  The left  anterior descending is a large vessel supplying the apex and contains no  significant stenoses. The circumflex coronary artery is normal.  Right  coronary artery is normal.   IMPRESSION:  1.  Normal coronary arteries.  2.  Normal left ventricular function.   RECOMMENDATIONS:  Medical therapy.  Continue to search for other causes of  chest discomfort.      Jamie [name]   WST/MEDQ  D:  08/07/2004  T:  08/07/2004  Job:  [id number]   cc:   Hal T. Stoneking,  [name].  [address] [place], [place] [address]  Fax: [phone]

## 2011-03-05 NOTE — Cardiovascular Report (Signed)
[name], Jamie [name]              ACCOUNT NO.:  [id number]   MEDICAL RECORD NO.:  [id number]          PATIENT TYPE:  INP   LOCATION:  3713                         FACILITY:  [facility]   PHYSICIAN:  Elmore Guise., [name].DATE OF BIRTH:  [birth year]   DATE OF PROCEDURE:  01/12/2007  DATE OF DISCHARGE:                            CARDIAC CATHETERIZATION   INDICATION FOR PROCEDURE:  Recurrent chest pain   DESCRIPTION OF PROCEDURE:  The patient is a very pleasant 75 year old  white female who presented with increasing chest pain.  She is now  referred for cardiac catheterization.  The patient is brought to the  cardiac cath lab.  After appropriate informed consent, she is prepped  and draped in sterile fashion.  Approximately 10 cubic centimeters of 1%  lidocaine was used for local anesthesia.  A 5-French sheath was placed  in the right femoral artery.  Coronary angiography, LV angiography were  then performed without difficulty.   FINDINGS:  1. Left Main:  Normal.  2. LAD:  Normal.  3. D1/D2:  Moderate-sized vessels, normal.  4. LCx:  Not dominant, normal-appearing.  5. OM-1:  Moderate-sized vessel, mild luminal irregularities.  6. OM-2:  Small vessel mild luminal irregularities.  7. RCA:  Dominant, normal-appearing  8. LV:  EF is 65-70%.  No wall motion abnormalities.  LVEDP is 14      mmHg.   IMPRESSION:  1. Normal-appearing coronary arteries.  2. Hyperdynamic left ventricular systolic function with an ejection      fraction of 65-70%.   PLAN:  At this time, I would recommend aggressive risk factor  modification as indicated.      Elmore Guise., [name].  Electronically Signed     TWK/MEDQ  D:  01/12/2007  T:  01/12/2007  Job:  [id number]

## 2011-03-05 NOTE — H&P (Signed)
Jamie [name], Jamie [name]              ACCOUNT NO.:  [id number]   MEDICAL RECORD NO.:  [id number]          PATIENT TYPE:  INP   LOCATION:  1826                         FACILITY:  [facility]   PHYSICIAN:  Lonia Blood, [name].       DATE OF BIRTH:  Dec 24, 1935   DATE OF ADMISSION:  02/12/2006  DATE OF DISCHARGE:                                HISTORY & PHYSICAL   PRIMARY CARE PHYSICIAN:  Dr. Antony Madura, [name].   PRIMARY CARDIOLOGIST:  Georga Hacking, [name].   CHIEF COMPLAINT:  Chest pain.   HISTORY OF PRESENT ILLNESS:  Jamie [name] is a 75 year old woman with a  history of diabetes and hypertension who presents to [facility] emergency  room with complaints new onset sharp left-sided chest pain.  Of note, the  patient had, in January 2007, a mastectomy, followed by radiation and  chemotherapy to that side of the chest.  She reports that she felt fine  until yesterday when she started having repeat episodes of sharp chest pain  not associated with diaphoresis or dyspnea.  The patient has also severe  anxiety disorder for which she takes Klonopin three times a day, and she has  been evaluated the past with a cardiac catheterization that was within  normal limits.  Jamie [name] called her cardiologist who recommended the  patient come to the emergency room to be evaluated.   PAST MEDICAL HISTORY:  1.  Anxiety disorder.  2.  Gastroesophageal reflux disease.  3.  An episode of lower GI bleed, in 2005, with subsequent colonoscopy      showing ischemic colitis.  4.  Diabetes mellitus, type 2 for seven years that is controlled with diet.  5.  Hypertension.  6.  Left breast cancer with metastasis to the bone, status post      chemotherapy, radiation, and mastectomy.  7.  Mitral valve prolapse.  8.  Lumbar spondylosis.  9.  Asthma.  10. The patient is status post total hysterectomy and bilateral salpingo-      oophorectomy.  11. She is status post appendectomy.  12. History of Meniere  disease.   MEDICATIONS:  1.  Nexium 40 mg daily.  2.  Hydrochlorothiazide 25 mg daily.  3.  Clonazepam 0.5 mg three times a day.  4.  Arimidex 1 mg daily.  5.  Zebeta 5 mg daily.  6.  Lotrel 5/10 mg daily.  7.  Fish oil 1 capsule daily.  8.  Calcium with vitamin D daily.  9.  Zocor 20 mg daily.   ALLERGIES:  The patient has reported allergy to:  1.  NIACIN  2.  PHENERGAN.  3.  ERYTHROMYCIN.   SOCIAL HISTORY:  She lives with her husband.  She has a son that has  muscular dystrophy and he is a on life support.  The patient is married and  retired.  She does not smoke cigarettes, and she does not drink alcohol.   FAMILY HISTORY:  Both parents are deceased, none of them had coronary artery  disease and they both lived to be of old age.  The  patient has two brothers,  both of them with prostate cancer.   REVIEW OF SYSTEMS:  Positive for some low back pain and lower extremity  discomfort that interferes with the patient's ability to walk.  Positive for  some bloating but no abdominal pain.  All other systems are negative.  The  other systems as per HPI.   PHYSICAL EXAMINATION:  VITAL SIGNS:  On admission shows a temperature of  98.0, blood pressure 123/59, pulse 60, respirations 14, saturation 97% on  room air.  GENERAL APPEARANCE:  This is extremely anxious woman who is sitting on the  stretcher in no acute distress, alert, oriented to place, person, and time.  The patient's husband is at the bedside and appears extremely supportive.  HEENT:  Head is normocephalic, atraumatic.  Eyes show pupils equal and  round, reactive to light and accommodation.  Extraocular movement is intact.  NECK:  Supple without JVD.  No carotid bruits.  CHEST:  The chest wall on the left side, there is a scar post mastectomy  that is well-healed.  On palpation of the chest wall, I can not detect any  elements suggestive of recurrence of the breast cancer.  There is a slight  skin thickening and a slight  increase in the firmness of the subcutaneous  tissue.  The axilla has status post lymph node dissection but is well-healed  any it does not have any palpable recurrent cancer.  HEART:  Regular rate and rhythm without murmurs, rubs, gallops.  CHEST:  Clear to auscultation bilaterally without wheezes, rhonchi or  crackles.  ABDOMEN:  Slightly distended.  The bowel sounds are slightly decreased.  There is no significant tenderness.  SKIN:  Warm and dry.  EXTREMITIES:  Have no edema.  NEUROLOGIC:  Appears to be nonfocal.   LABORATORY VALUES:  At the time of admission:  EKG shows normal sinus rhythm  without any ST or T changes.  White blood cell count is 6.3, hemoglobin  13.4, hematocrit is 38.1.  Portable chest x-ray shows no active lung  disease.  Platelet count is 279.  Sodium is 138, potassium 3.7, chloride  108, BUN 12, glucose 139, bicarbonate is 20, creatinine 0.3.  Myoglobin is  63, CK-MB less than one, troponin I less than 0.05   ASSESSMENT/PLAN:  1.  Chest pain.  Given history and physical examination, this appears to be      chest wall pain.  Also differential has to include acute coronary      syndrome, although this is very unlikely.  My plan is to treat the      patient with a low-dose aspirin and continue her on Zebeta and also add      some ibuprofen for the chest wall pain.  The patient will be observed on      telemetry for 23 hours and three sets of cardiac enzymes will be      obtained.  Cardiology consultation will be obtained only if the patient      has positive cardiac markers.  2.  Gastroesophageal reflux disease.  The patient will be continued on her      Nexium and some simethicone will be added to help with the patient's      discomfort and bloating.  3.  Breast cancer.  We will continue the patient's Arimidex and she will      follow up as an outpatient with Dr. Darnelle Catalan.  . 4.  Anxiety.  I have discussed with Mrs.  [name] options other than the      Klonopin  but she is refusing any of them.  For now we will continue her      Klonopin of 0.5 mg three times a day and observe closely in the      hospital.  5.  Deep vein thrombosis prophylaxis will be done by early ambulation.      Lonia Blood, [name].  Electronically Signed     SL/MEDQ  D:  02/12/2006  T:  02/12/2006  Job:  [id number]   cc:   Antony Madura, [name].  Fax: [phone]   W. Viann Fish, [name].  Fax: [phone]  Email: [email or web address] .Hester Mates. Magrinat, [name].  Fax: [phone]

## 2011-03-05 NOTE — H&P (Signed)
NAME:  Jamie [name], Jamie [name]                        ACCOUNT NO.:  [id number]   MEDICAL RECORD NO.:  [id number]                   PATIENT TYPE:  INP   LOCATION:  3315                                 FACILITY:  [facility]   PHYSICIAN:  Bernette Redbird, [name].                DATE OF BIRTH:  1936-10-05   DATE OF ADMISSION:  07/04/2004  DATE OF DISCHARGE:                                HISTORY & PHYSICAL   This 75 year old female is being admitted to the hospital because of GI  bleeding and abdominal pain.   HISTORY:  Ms. Mallet is known to me as an outpatient for previous treatment  of reflux disease.  She has never previously had GI bleeding, then this  afternoon, without warning, she felt sweaty on the commode and passed blood.  She, since then, has had repeat hematochezia on 1-2 occasions.  She has felt  somewhat weak and dizzy but has not been frankly syncopal.  There has been  quite [name] bit of low abdominal cramping and [name] gurgling sensation in her  abdomen.  Previous sigmoidoscopy, by me, did not disclose any diverticular  disease, by the patient's report.  The patient had called me and I directed  her to the emergency room where she was hemodynamically stable but inpatient  evaluation and management of the bleeding was felt to be clinically  warranted.   PAST MEDICAL HISTORY:   ALLERGIES:  No known allergies.   OUTPATIENT MEDICATIONS:  1.  Zebeta 5 mg p.o. q.[name].m.  2.  Klonopin, unknown dose.  3.  Lotrel 5/10 mg p.o. q.[name].m.  4.  Lipitor 10 mg p.o. q.[name].m.  5.  Prevacid 30  mg twice daily.  6.  HCTZ 12.5 mg q.[name].m.  7.  Singulair 10 mg p.o. q.[name].m.  8.  Pulmicort inhaler once or twice [name] day.  9.  Premarin.   OPERATIONS:  1.  Back surgery in 1994.  2.  TAH with BSO and  3.  Appendectomy in 1995.  4.  She has had nasal surgery x 2.  5.  D&C.  6.  Bilateral carpal tunnel surgery.   MEDICAL ILLNESSES:  1.  History of mitral valve prolapse for which she has been maintained on [name]  variety of beta-blockers.  She did not tolerate Toprol XL or atenolol      and in fact was just in the hospital last month for atypical chest pain,      related to Toprol which seemed to lead to exertional chest pain.  The      patient indicates she had [name] negative Cardiolite study last year.  There      is no known history of coronary disease or MI or hypertension.  2.  Although there is no history of COPD, she does have moderately severe      asthma.  3.  Borderline diabetes which is controlled by diet for the past four years.  4.  There is also [name] history of GERD.  5.  Anxiety with panic attacks.  6.  History of Meniere disease.   HABITS:  Nonsmoker.  Nondrinker.   FAMILY HISTORY:  Gallbladder removal in her mother and Crohn colitis in her  daughter.   SOCIAL HISTORY:  Married.  Has [name] son with [name] handicap (I believe muscular  dystrophy), has worked previously at [employer] but currently is out of  work.   REVIEW OF SYSTEMS:  The patient's heartburn has been acting up recently with  daytime post prandial heartburn especially if she sits down or tried to lie  down, also problems with nocturnal regurgitation and awakening with  coughing.  No problem with significant anorexia, although she sometimes does  not feel like eating much when her stomach is acting up.  No weight loss.  She does have daily epigastric abdominal pain, probably related to her  reflux.  Occasional constipation with need for suppository but does maintain  defecation pretty much every day.  No diarrhea.  Until today, no previous  rectal bleeding episodes.  She has arthralgias in her right hip and knee,  chronic back pain, no skin problems, occasional exertional chest pain as  noted above, and periodic asthma exacerbations.  Yesterday she fell in the  grocery store and skinned her left knee and may have sprained her right  wrist mildly.   PHYSICAL EXAMINATION:  GENERAL:  This is [name] healthy-appearing Caucasian  female  in no evident distress and not appearing anxious or depressed.  VITAL SIGNS:  Blood pressure 95/59 (she states her systolic blood pressure  is usually 110 at most), pulse 64, respirations 20, afebrile, normal O2 sat.  HEENT:  Anicteric.  No evident pallor.  CHEST:  Clear to auscultation.  No wheezes.  HEART:  Normal.  No gallops, rubs, murmurs, clicks, or arrhythmias  appreciated.  ABDOMEN:  Active, nonobstructive bowel sounds, not really hyperactive.  No  organomegaly, guarding, mass, or abdominal tenderness appreciated at  present.  RECTAL:  Will be performed at time of upcoming colonoscopy (see below).  NEUROLOGIC:  The patient is coherent, alert, and appropriate without obvious  cranial nerve or focal motor deficits.  EXTREMITIES:  The patient has [name] mucoid scab on her left knee corresponding  to her fall yesterday.  Her right wrist is without effusion, tenderness, or  significant limitation of range of motion.   LABS:  Pending at this time.   IMPRESSION:  1.  Abdominal cramps and hematochezia, recurrent, acute onset, most      compatible with ischemic colitis, although [name] diverticular bleed could      present in [name] similar fashion.  2.  History of atypical chest pain.  3.  Glucose intolerance.  4.  Hypertension.  5.  Gastroesophageal reflux disease.  6.  Asthma.  7.  Meniere disease.  8.  Mitral valve prolapse.  9.  Anxiety.   PLAN:  1.  Await labs.  2.  IV fluids.  3.  Bowel rest for now.  4.  Symptom control with antispasmodics.  5.  The patient does not wish to have narcotics.  6.  Colonoscopy this evening to look for ischemic colitis, or diverticular      changes, or other source of hematochezia.  Consider endoscopy if BUN is      significantly elevated and/or colonoscopy suggests an upper tract      source.  7.  Follow labs with further management to depend on the patient's  clinical      evolution.       RB/MEDQ  D:  07/04/2004  T:  07/04/2004  Job:   [id number]  cc:   Hal T. Stoneking, [name].  [address]  [place], [place] [address]  Fax: [phone]   W. Ashley Royalty., [name].  [address]  [place]  [place] [address]  Fax: [phone]

## 2011-03-05 NOTE — Discharge Summary (Signed)
NAME:  Jamie Jamie [name], Jamie Jamie [name]                        ACCOUNT NO.:  [id number]   MEDICAL RECORD NO.:  [id number]                   PATIENT TYPE:  INP   LOCATION:  2033                                 FACILITY:  [facility]   PHYSICIAN:  Melissa L. Ladona Ridgel, MD               DATE OF BIRTH:  June 21, 1936   DATE OF ADMISSION:  05/19/2004  DATE OF DISCHARGE:  05/20/2004                                 DISCHARGE SUMMARY   ADMITTING DIAGNOSIS:  Atypical chest pain.   DISCHARGE DIAGNOSES:  1. Noncardiac chest pain.  2. Hypertension, stable.  3. Asthma, stable.  4. Glucose intolerance, stable.  5. Meniere's syndrome, stable.  6. Severe esophageal reflux, at baseline.  7. Severe anxiety and panic attacks.   MEDICATIONS AT THE TIME OF DISCHARGE:  1. Lipitor 10 mg once daily.  2. Premarin 0.3 mg once daily.  3. Zebeta 5 mg once daily.  4. Hydrochlorothiazide 12.5 mg once daily.  5. Prevacid 30 mg twice daily 12 hours apart.  6. Lotrel 5/10 mg once daily.  7. Klonopin -- unknown home dose but to resume at that level.  8. Singulair 10 mg once daily.   DIET:  Low salt, low cholesterol.   SPECIAL INSTRUCTIONS:  If you develop worsening shortness of breath or  persistent chest discomfort, please call your primary care physician or  return to the emergency room.   FOLLOWUP:  Please follow up with Dr. Georga Hacking in 1 week and follow  up with Jamie [name] regular appointment for Dr. Ann Maki T. Stoneking.   HISTORY OF PRESENT ILLNESS:  The patient is Jamie [name] 75 year old white female who  presented to Dr. Laverle Hobby office with Jamie [name] complaint of exertional chest  discomfort.  Dr. Pete Glatter was out of the office and therefore she was seen  by his colleague, Dr. Theressa Millard.  The patient had recently been switched  from Zebeta to Toprol.  According to the history given by her cardiologist,  the patient, in the past, has had similar changes to her medication and  developed, at that time, what appeared to be bronchospasm on  beta blockers;  she was therefore maintained on Zebeta as an outpatient.  During the use of  beta blocker, she had Jamie [name] similar presentation with chest pain that appeared  exertional in origin and it was felt to be related to her underlying asthma.  The patient recently had inquired about changing off of Zebeta and was  placed on Toprol; her first dose was yesterday; it was shortly after taking  the beta blocker that she developed these symptoms.   HOSPITAL COURSE:  Today, on the day of discharge, the patient appears  comfortable, she states that she has not had the discomfort from yesterday,  but she has not been walking or exerting herself to any extent and therefore  she is concerned that this will return, but all told, she feels well, other  than the  only complaint of being hungry this morning.   She was admitted yesterday to telemetry.  Serial cardiac enzymes were  negative for any cardiac event.  Her EKG remains normal sinus rhythm with no  active T wave changes.  Her exam this morning reveals normocephalic,  atraumatic.  Pupils are equal, round and reactive to light.  Extraocular  muscles are intact.  Mucous membranes are moist.  Her neck is supple, there  is no JVD, no lymph nodes.  Chest is clear to auscultation; there are no  rhonchi, rales or wheezes.  Cardiovascular is regular rate and rhythm,  positive S1 and S2, no S3 or S4, no murmurs, rubs, or gallops.  Abdomen is  soft, nontender and nondistended with positive bowel sounds.  Extremities  show 2+ pulses with no clubbing, cyanosis or edema.  Her vital signs:  Temperature is 97.9, pulse is 58, respirations are 20, blood pressure is  110/65, saturation is 97% and the blood glucoses during the course of the  hospitalization have been 132 and 144, respectively.  Of note, the patient  has significant situational stress at home as per her cardiologist, which  may be playing Jamie [name] role in her chest discomfort.  She also has severe GERD  and  we will therefore increase her Prevacid to twice daily in attempt to combat  esophageal spasm related to reflux.  The patient will have followup with Dr.  Donnie Aho in the next week and the plan would be that if she has persistent  symptomatology, that she would potentially go directly to catheterization  instead of undergoing further outpatient functional stress testing.  The  patient will be discharged to home on her Zebeta and previous cardiac  medications.  I have instructed her to follow up with Dr. Donnie Aho and Dr.  Pete Glatter and should the symptomatology return with persistence, then she  should find herself in the emergency room or her primary care physician's  office.   CONDITION ON DISCHARGE:  The patient is found to be stable at this time of  discharge.                                                Melissa L. Ladona Ridgel, MD    MLT/MEDQ  D:  05/20/2004  T:  05/20/2004  Job:  [id number]   cc:   Darden Palmer., [name].  [address]  [place]  [place] [address]  Fax: [phone]   Hal T. Stoneking, [name].  [address] [place], [place] [address]  Fax: [phone]

## 2011-03-05 NOTE — H&P (Signed)
NAMEJUDE, Jamie [name]              ACCOUNT NO.:  [id number]   MEDICAL RECORD NO.:  [id number]          PATIENT TYPE:  INP   LOCATION:  3713                         FACILITY:  [facility]   PHYSICIAN:  Cassell Clement, [name]. DATE OF BIRTH:  04/04/1936   DATE OF ADMISSION:  01/11/2007  DATE OF DISCHARGE:                              HISTORY & PHYSICAL   HISTORY:  This is a 75 year old, married, Caucasian female who comes to  the emergency room on January 11, 2007 with a 3-day history of worsening  substernal chest discomfort.  The discomfort is substernal, does not  radiate to the arm, is associated with slight nausea.  It seems to be  worse when she is active and moving about.  She also wonders if some of  it may be indigestion because she does have a history of  gastroesophageal reflux and has been taking Carafate.  However, the  Carafate does not seem to be helping the symptoms at this time.  Of note  is the fact that the patient has risk factors of diabetes and  hypertension.  She had a cardiac catheterization August 07, 2004 which  was negative at that time.  She has a history of suspected mitral valve  prolapse and has been on beta blockers.   FAMILY HISTORY:  Noncontributory.   PAST MEDICAL HISTORY:  1. History of fibromyalgia.  2. GERD.  3. Meniere's disease.  4. Hypertension.  5. Asthma.  6. Diabetes.   PAST SURGICAL HISTORY:  1. Cyst removed from the spine.  2. History of previous total hysterectomy.  3. She has also had left mastectomy for breast cancer in January 2007      followed by radiation therapy to left chest.  4. She has also had carpal tunnel release.  5. Has had nasal surgery twice.   SOCIAL HISTORY:  The social history reveals that she is married.  She  never smoked.  She does not use alcohol.  She has a son with muscular  dystrophy and she is a full-time caregiver,  She does admit to lot of  stress in her life at home.   REVIEW OF SYSTEMS:  The review of  systems is negative except for malaise  and fatigue and a history of asthma, history of chronic dyspepsia,  hiatal hernia and a history of GERD.  Her musculoskeletal does reveal a  past history of fibromyalgia.   PRESENT MEDICATIONS:  1. Carafate 1 gram four times a day.  2. Klonopin 0.5 mg twice a day.  3. Maxzide 37.5/25 one-half  tablet daily.  4. Nolvadex 20 mg daily.  5. Nexium 40 mg daily.  6. Zebeta 5 mg daily.  7. Crestor 5 mg daily.  8. She is also on metformin 1000 mg a day which is being held.   PHYSICAL EXAMINATION:  VITAL SIGNS:  Blood pressure is 130/72.  Pulse is  80 and regular.  Respirations are normal.  GENERAL APPEARANCE:  She is well-developed, well-nourished woman in no  distress.  SKIN:  The skin is warm and dry.  HEAD AND NECK:  Unremarkable.  Carotids no  bruits.  Jugular venous  pressure normal.  Thyroid normal.  LUNGS:  Lungs are clear.  CHEST:  Thorax reveals previous left mastectomy and she does have  hyperpigmentation of the left anterior chest secondary to previous  radiation therapy.  HEART:  Reveals a quiet precordium without murmur, gallop, rub or click.  ABDOMEN:  The abdomen is soft and nontender.  EXTREMITIES:  The extremities show good peripheral pulses, no phlebitis  or edema.  NEUROLOGIC:  Neurologic exam is physiologic.   LABORATORY DATA:  Her electrocardiogram is normal and unchanged.  Her  initial labs including ISTAT show sodium 138, potassium 3.7, chloride  104, BUN of 11, blood sugar 112.  Hematocrit 39 with hemoglobin of 13.3.  White count is 4800.  Repeat potassium is 3.4.  Coagulation studies are  normal.  Liver function studies are normal.  Cardiac enzymes are normal.  Chest x-ray shows normal heart size and clear lung fields.   IMPRESSION:  Possible acute coronary syndrome with crescendo angina over  the past 3 days although no EKG changes at present.  The patient is  quite anxious.  She does have risk factors of hypertension  and diabetes  and hypercholesterolemia.   DISPOSITION:  The patient is being admitted to a telemetry bed.  She  will be placed on anticoagulation with Lovenox and also placed on  aspirin.  Serial enzymes will be obtained.  Her beta blocker will be  continued.  Her Glucophage will be held and we will plan for diagnostic  cardiac catheterization in a.m. by Dr. Lady Deutscher.           ______________________________  Cassell Clement, [name].     TB/MEDQ  D:  01/11/2007  T:  01/12/2007  Job:  [id number]   cc:   Elmore Guise., [name].  Georga Hacking, [name].  Reather Littler, [name].

## 2011-03-05 NOTE — Consult Note (Signed)
NAME:  Jamie [name], Jamie [name]                        ACCOUNT NO.:  [id number]   MEDICAL RECORD NO.:  [id number]                   PATIENT TYPE:  EMS   LOCATION:  MAJO                                 FACILITY:  [facility]   PHYSICIAN:  Karol T. Lazarus Salines, [name].              DATE OF BIRTH:  06/16/1936   DATE OF CONSULTATION:  10/13/2003  DATE OF DISCHARGE:                                   CONSULTATION   EMERGENCY DEPARTMENT CONSULTATION:   CHIEF COMPLAINT:  Right ear pain.   HISTORY OF PRESENT ILLNESS:  This 75 year old white female has [name] significant  history of some crusting in her ears for which she either uses Q-Tips or  picks with her fingernails.  This generally does not give her any trouble.  Beginning two days ago, she had [name] relatively rapid progressive development  of pain in the right ear hole which extended over the next 24 hours to  include the external ear and even some erythema and swelling onto the  periauricular skin, anterior and posteriorly, and some slight tenderness  down into the neck.  No fever.  She is reportedly [name] diet controlled  diabetic.  Her blood sugar was 81 yesterday at home.  She has never had [name]  prior similar problem with her ears.  Prior to the recent development, she  has not had chronic moisture or itching.  Right now the left ear feels fine.  No history of swelling of the external pinnae or nose.   PHYSICAL EXAMINATION:  GENERAL:  This is [name] mildly distressed appearing adult  white female.  EARS:  There is slight swelling and erythema of the right pinna and  periauricular skin for two to three cm in all directions.  There is only  mild tenderness.  The meatus is minimally swollen, slightly moist.  There is  no debris down in the ear canal.  The drum looks normal.  The left side is  clear.  NECK:  With [name] one cm tail of parotid node on the right side only.   IMPRESSION:  1. Probable external otitis with perichondritis and cellulitis, right ear.  2. Controlled  diabetes.   PLAN:  We rechecked [name] CBG in the emergency room which was 121.  I had given  her [name] prescription yesterday for Cipro-Dex drops and oral Cipro tablets  which she will continue to use.  Water precautions.  She declines  analgesics.  I will see her back in two days, sooner as needed.  She  understands and agrees.                                               Gloris Manchester. Lazarus Salines, [name].    KTW/MEDQ  D:  10/13/2003  T:  10/14/2003  Job:  [id number]

## 2011-03-05 NOTE — Discharge Summary (Signed)
NAMELEARA, Jamie [name]              ACCOUNT NO.:  [id number]   MEDICAL RECORD NO.:  [id number]          PATIENT TYPE:  INP   LOCATION:  5735                         FACILITY:  [facility]   PHYSICIAN:  Wilmon Arms. Corliss Skains, [name]. DATE OF BIRTH:  [birth year]   DATE OF ADMISSION:  11/02/2005  DATE OF DISCHARGE:  11/04/2005                                 DISCHARGE SUMMARY   ADMISSION DIAGNOSIS:  Left breast cancer.   DISCHARGE DIAGNOSIS:  Left breast cancer.   HISTORY OF PRESENT ILLNESS:  Briefly, Jamie [name] is a 75 year old female who  was found to have multifocal, multicentric breast cancer on the left side.  This was confirmed by core biopsy.  She was admitted to the hospital for a  left sentinel lymph node biopsy with a simple mastectomy.   HOSPITAL COURSE:  The patient underwent a left breast injection by nuclear  medicine on Tuesday, November 02, 2005.  She then was brought to the  operating room where we performed a left axilla sentinel lymph node biopsy  with a planned simple mastectomy, however, the sentinel lymph nodes showed  metastatic cancer cells on touch prep so the operation was converted to a  modified radical mastectomy including axillary dissection.  Two drains were  left in place.  The patient did extremely well postoperatively.  She had  some pain issues initially postoperative but these rapidly resolved that  evening and the patient has not had any pain medicine in over 24 hours.  She  has two Jackson-Pratt drains in which are functioning well.  Her wound looks  good and is healing well.  The skin flaps appear viable. She is tolerating a  regular diet.  She feels ready to go home.   DISCHARGE INSTRUCTIONS:  The patient has been instructed on care of her  Jackson-Pratt drains.  We will see her in the office on Monday and possibly  remove the drains.  We will also arrange outpatient follow up with oncology.  The patient denies the need for any pain medication but we will call  in a  prescription for her so she will have some should she need them.      Wilmon Arms. Tsuei, [name].  Electronically Signed     MKT/MEDQ  D:  11/04/2005  T:  11/04/2005  Job:  [id number]

## 2011-03-05 NOTE — Op Note (Signed)
NAME:  JENESYS, CASSEUS A                        ACCOUNT NO.:  [id number]   MEDICAL RECORD NO.:  [id number]                   PATIENT TYPE:  INP   LOCATION:  5004                                 FACILITY:  [facility]   PHYSICIAN:  Bernette Redbird, [name].                DATE OF BIRTH:  1936/10/08   DATE OF PROCEDURE:  07/04/2004  DATE OF DISCHARGE:  07/06/2004                                 OPERATIVE REPORT   PROCEDURE:  Colonoscopy with biopsy.   SURGEON:   INDICATIONS FOR PROCEDURE:  A 75 year old female with no prior history of GI  bleeding who presented with abrupt onset hematochezia and bloody diarrhea  with low abdominal pain this afternoon.   FINDINGS:  Left-side ischemic colitis.   DESCRIPTION OF PROCEDURE:  The nature, purpose and risks of the procedure  had been reviewed with the patient with provided written consent.  Sedation  was Fentanyl 100 mcg and Versed 9 mg IV prior to and during the course of  the procedure without clinical instability.   The Olympus adjustable tension pediatric video colonoscope was advanced  around the tight turn in the sigmoid region, turning the patient into the  supine to facilitate passage, then fairly easily around the colon to the  area just above the cecum, using the external abdominal compression with the  patient back in the left lateral decubitus position to reach the base of the  cecum as identified by visualization of the appendiceal orifice.  Pullback  was then performed.   The main finding on this examination was segmental colitis, extending from  roughly 15 or 20 cm up to 40 or 45 cm, corresponding to the left colon,  consistent with ischemic colitis, characterized by semicircumferential or  linear bands of erythema with exudate, then toward the central portion of  the segment, circumferential changes of a similar nature. There were no  blebs, no violaceous areas, no evidence of gangrene, no active hemorrhage,  although there was some  mucoid blood here and there in the colon.   The rectum and the transverse colon and ascending colon had normal mucosa.  There was some formed brown stool in the cecal region and proximal ascending  colon which may have obscured small lesions but it is not felt that any  major lesions would likely have been missed.   Some biopsies were obtained from the inflamed segment prior to removal of  the scope.  Retroflexion in the rectum was unremarkable.   The patient tolerated the procedure well and there were no apparent  complications.   IMPRESSION:  Left-sided ischemic colitis of mild severity.   PLAN:  Await pathology on biopsies.  Supportive care in the meantime.      RB/MEDQ  D:  07/04/2004  T:  07/06/2004  Job:  [id number]   cc:   Hal T. Stoneking, [name].  [address] [place], [place] [address]  Fax: [phone]  Darden Palmer., [name].  [address]  [place]  [place] [address]  Fax: [phone]

## 2011-03-05 NOTE — Op Note (Signed)
NAMEARIELY, RIDDELL              ACCOUNT NO.:  [id number]   MEDICAL RECORD NO.:  [id number]          PATIENT TYPE:  OIB   LOCATION:  5735                         FACILITY:  [facility]   PHYSICIAN:  Wilmon Arms. Corliss Skains, [name]. DATE OF BIRTH:  11-11-1935   DATE OF PROCEDURE:  11/02/2005  DATE OF DISCHARGE:                                 OPERATIVE REPORT   PREOPERATIVE DIAGNOSIS:  Multifocal left breast cancer.   POSTOPERATIVE DIAGNOSES:  1.  Multifocal left breast cancer.  2.  Positive left axillary sentinel lymph node.   PROCEDURE PERFORMED:  Left axillary sentinel lymph node biopsy with left  modified radical mastectomy.   SURGEON:  Wilmon Arms. Corliss Skains, [name].   Threasa HeadsJanee Morn.   ANESTHESIA:  General endotracheal.   INDICATIONS:  The patient is a 75 year old female with a past medical  history significant for type 2 diabetes, gastroesophageal reflux and asthma  who presented with a mass in her left breast. This was biopsied and showed  invasive cancer as well as some DCIS. She underwent MRI which showed  multifocal multicentric disease. The patient was then recommended to have a  left mastectomy with sentinel lymph node biopsy.  This surgery had to be  delayed due to upper respiratory infection.   DESCRIPTION OF PROCEDURE:  The patient was brought to the operating room  after being injected in nuclear medicine. She was placed in supine position  on the operating room table. After an adequate level of general endotracheal  anesthesia was obtained a Foley catheter was placed under sterile technique.  The patient's left breast was exposed. 5 mL of methylene blue dye was  injected intradermally around the nipple. This area was then massaged for 5  minutes. The NeoProbe was used to find a hot spot in her axilla. A  transverse incision was made directly over this hot spot. Dissection was  carried down into the subcutaneous tissues with cautery. The axillary fascia  was opened and blunt  dissection was used to isolate a lymph node that was  hot.  Several blue lymphatics were seen tracking towards this lymph node.  Clips were used to ligate the lymphatics and the lymph node was removed.  This was sent for frozen section. While the frozen section was being  processed, we outlined our mastectomy incision.  Skin flaps were raised with  cautery superiorly to the infraclavicular area. We then used the scalpel to  open the lower incision and carried our skin flaps to the inframammary fold.  Cautery was used to dissect the breast tissue off the pectoralis muscle  including the anterior fascia with the specimen. We continued this  dissection out to the lattisimus dorsi muscle.  At this point we had  received word that the sentinel node was positive for tumor. We then  performed a left axillary lymph node dissection. We were able to isolate the  long thoracic and the posterior clavicular nerves. The wound was then  thoroughly irrigated with saline. Hemostasis was obtained with cautery. Two  #19 drains were brought through separate stab incisions.  One was placed in  the  axilla and one was placed  transversely across the mastectomy site. These were secured with 2-0 Ethilon  sutures. Both wounds were then closed with subcuticular layers of 3-0  Vicryl. Staples were used to close the skin. Clean dressings were applied  and both drains were placed to suction.      Wilmon Arms. Tsuei, [name].  Electronically Signed     MKT/MEDQ  D:  11/02/2005  T:  11/03/2005  Job:  [id number]

## 2011-03-05 NOTE — Discharge Summary (Signed)
NAME:  Jamie [name], Jamie [name]                        ACCOUNT NO.:  [id number]   MEDICAL RECORD NO.:  [id number]                   PATIENT TYPE:  INP   LOCATION:  5004                                 FACILITY:  [facility]   PHYSICIAN:  Bernette Redbird, [name].                DATE OF BIRTH:  1936/10/08   DATE OF ADMISSION:  07/04/2004  DATE OF DISCHARGE:  07/06/2004                                 DISCHARGE SUMMARY   FINAL DIAGNOSES:  1.  Ischemic colitis.  2.  Mitral valve prolapse.  3.  Asthma.  4.  Diet controlled diabetes.  5.  Gastroesophageal reflux disease.  6.  History of anxiety with panic attacks.  7.  History of Meniere's disease.  8.  Situational stress.  9.  Posthemorrhagic anemia secondary to #1.   CONSULTATIONS:  None.   COMPLICATIONS:  None.   PROCEDURES:  Colonoscopy with biopsy.   LABORATORY DATA:  Admission white count 8500, hemoglobin 13 at 11.7  following hydration, platelet count 263,000.  Differential count showed 78  polys, 17 lymphs, 5 monocytes.  INR 0.8.  BUN 15, potassium 3.7.   HOSPITAL COURSE:  Jamie [name] was admitted through the emergency room  following [name] several-hour prodrome of intense low abdominal cramps and  diarrhea with fresh blood.  She had never had any similar problem like this  in the past although she has [name] history of some IBS.  She underwent  colonoscopy on the day of admission and this showed left-sided ischemic  colitis, mild to moderate in severity, from roughly 20 cm to 40 cm.  There  was no evidence of diverticular disease or other sources of bleeding.  There  was no evidence of polyps or cancer all the way to the cecum on this  unprepped colon (minimal stool in the cecum prevented optimal visualization  of that area, but it is felt no significant lesions would have been missed).  The colitis did not appear gangrenous or severely necrotic.  Several  biopsies were obtained, the results of which are pending at this time.   The patient never  really required much pain medication at all for this  condition and was managed primarily with some antispasmodics and [name] heating  pad.  Her symptoms quickly improved, her diet was advanced, her vital signs  remained stable, and her activity was increased.  By the day of discharge,  she was literally asymptomatic, without either pain or bleeding and she had  had no further bowel movement so discharge was felt to be clinically  appropriate.   DISPOSITION:  The patient is discharged home on [name] regular diet and regular  activity.  She was instructed that this condition tends to be sporadic and  idiopathic and that there are no specific measures such as activity, diet,  or medications which are known to prevent its recurrence.  She may thus  resume her previous medications and diet.  The patient is under [name] fair amount of situational stress at this time at  home including financial troubles and so forth so it was recommended to her  that she consider discussing this with her primary physician, Dr. Pete Glatter,  to consider either psychiatric evaluation and referral or perhaps some sort  of pharmacotherapy.   DISCHARGE MEDICATIONS:  The patient will  resume all of her previous home  medications which include:  Zebeta 5 mg each morning, Klonopin (dose not  clear), Lotrel 5/10 mg p.o. q.[name].m., Lipitor 10 mg p.o. q.[name].m., Prevacid 30  mg twice daily, HCTZ 12.5 mg q.[name].m., Singulair 10 mg p.o. q.[name].m., Pulmicort  inhaler once or twice [name] day, and Premarin.   We will send the patient [name] set of Hemoccult cards for her to mail back to [place]  at her convenience to confirm the absence of any smoldering ongoing  ischemia, but since this condition is ordinarily self-limited, follow up  with me can be on [name] p.r.n. basis.  Office follow up is not required unless  symptoms persist or recur.       RB/MEDQ  D:  07/06/2004  T:  07/06/2004  Job:  [id number]   cc:   Hal T. Stoneking, [name].  [address] [place], [place] [address]  Fax: [phone]

## 2011-03-05 NOTE — Discharge Summary (Signed)
Jamie [name], Jamie [name]              ACCOUNT NO.:  [id number]   MEDICAL RECORD NO.:  [id number]          PATIENT TYPE:  INP   LOCATION:  3713                         FACILITY:  [facility]   PHYSICIAN:  Elmore Guise., [name].DATE OF BIRTH:  [birth year]   DATE OF ADMISSION:  01/11/2007  DATE OF DISCHARGE:  01/13/2007                               DISCHARGE SUMMARY   DISCHARGE DIAGNOSES:  1. Chest pain (normal coronary arteries by cardiac catheterization).  2. Gastroesophageal reflux disease.  3. Hypertension.  4. History of breast cancer.  5. Dyslipidemia.  6. Diabetes mellitus.  7. Anxiety.   HISTORY OF PRESENT ILLNESS:  Jamie [name] is a very pleasant 75 year old  white female who was admitted for increasing exertional chest pain.   HOSPITAL COURSE:  The patient's hospital course was uncomplicated.  She  had serial cardiac enzymes performed which were negative.  She had a D-  dimer which was negative for PE.  Because of her symptoms and multiple  cardiac risk factors including diabetes, hypertension, dyslipidemia, she  was referred for cardiac catheterization.  She underwent cardiac  catheterization on January 12, 2007 which showed normal-appearing coronary  arteries with normal LV systolic function.  The patient does report  difficulties with home issues.  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  Her symptoms resolved with increasing of her  Nexium.  She had long discussions with a Child psychotherapist and counselor  regarding her home issues.  She has now been up and active, tolerated a  normal diet without problems.  She will be discharged home today.   DISCHARGE MEDICATIONS:  Include:  1. Nexium 40 mg daily.  2. Zebeta 5 mg daily.  3. Sucralfate 1 gram four times daily.  4. Pulmicort.  5. Tamoxifen 20 mg daily.  6. Clonazepam 0.5 mg 3 times daily.  7. Triamterene HCTZ 37.5/25 mg daily.  8. Crestor 5 mg daily.  9. Aspirin 81 mg daily.   The patient was  instructed not to restart her metformin until Sunday  January 15, 2007, because of her contrast exposure.  She will have post  cath restrictions with no heavy lifting or strenuous activities for the  next 48 hours.  She is to call Dr. Viann Fish for a office visit in  1-2 weeks.  I did encourage her to give me a call if she had any  problems over the weekend since I am on call, and our number was  provided.  Otherwise, she will keep her above appointment with Dr.  Donnie Aho and have her appointment with an outpatient counselor to discuss  with current home issues.      Elmore Guise., [name].  Electronically Signed     TWK/MEDQ  D:  01/13/2007  T:  01/13/2007  Job:  [id number]

## 2011-03-11 ENCOUNTER — Encounter: Payer: Self-pay | Admitting: Internal Medicine

## 2011-03-11 ENCOUNTER — Ambulatory Visit (AMBULATORY_SURGERY_CENTER): Payer: Medicare HMO | Admitting: *Deleted

## 2011-03-11 VITALS — Ht 64.0 in | Wt 157.0 lb

## 2011-03-11 DIAGNOSIS — Z860100 Personal history of colon polyps, unspecified: Secondary | ICD-10-CM

## 2011-03-11 MED ORDER — PEG-KCL-NACL-NASULF-NA ASC-C 100 G PO SOLR: ORAL | Status: DC

## 2011-03-11 NOTE — Progress Notes (Signed)
Pt. Refused propofol days even though she knows she may not sedate well with Versed and Fentanyl.

## 2011-03-18 ENCOUNTER — Telehealth: Payer: Self-pay | Admitting: Internal Medicine

## 2011-03-18 NOTE — Telephone Encounter (Signed)
Answered questions about "salt" in prep.  Reviewed clear liquid diet w/ patient, encouraged to drink plenty of clear liquids day of prep.  Encouraged to limit sodium in clear liquids. Jamie [name]

## 2011-03-23 ENCOUNTER — Other Ambulatory Visit: Payer: Medicare HMO | Admitting: Internal Medicine

## 2011-03-25 ENCOUNTER — Other Ambulatory Visit: Payer: Medicare HMO | Admitting: Internal Medicine

## 2011-04-01 ENCOUNTER — Encounter (HOSPITAL_COMMUNITY)
Admission: RE | Admit: 2011-04-01 | Discharge: 2011-04-01 | Disposition: A | Discharge status: Home / Self Care | Payer: Medicare HMO | Source: Ambulatory Visit | Attending: Oncology | Admitting: Oncology

## 2011-04-01 ENCOUNTER — Encounter (HOSPITAL_COMMUNITY): Payer: Self-pay

## 2011-04-01 ENCOUNTER — Other Ambulatory Visit: Payer: Self-pay | Admitting: Oncology

## 2011-04-01 ENCOUNTER — Encounter (HOSPITAL_BASED_OUTPATIENT_CLINIC_OR_DEPARTMENT_OTHER): Payer: Medicare HMO | Admitting: Oncology

## 2011-04-01 DIAGNOSIS — M25559 Pain in unspecified hip: Secondary | ICD-10-CM | POA: Insufficient documentation

## 2011-04-01 DIAGNOSIS — C50919 Malignant neoplasm of unspecified site of unspecified female breast: Secondary | ICD-10-CM | POA: Insufficient documentation

## 2011-04-01 DIAGNOSIS — Z5111 Encounter for antineoplastic chemotherapy: Secondary | ICD-10-CM

## 2011-04-01 DIAGNOSIS — C7951 Secondary malignant neoplasm of bone: Secondary | ICD-10-CM | POA: Insufficient documentation

## 2011-04-01 DIAGNOSIS — J984 Other disorders of lung: Secondary | ICD-10-CM | POA: Insufficient documentation

## 2011-04-01 DIAGNOSIS — C7952 Secondary malignant neoplasm of bone marrow: Secondary | ICD-10-CM | POA: Insufficient documentation

## 2011-04-01 DIAGNOSIS — R197 Diarrhea, unspecified: Secondary | ICD-10-CM

## 2011-04-01 DIAGNOSIS — Z9010 Acquired absence of unspecified breast and nipple: Secondary | ICD-10-CM | POA: Insufficient documentation

## 2011-04-01 DIAGNOSIS — M549 Dorsalgia, unspecified: Secondary | ICD-10-CM | POA: Insufficient documentation

## 2011-04-01 DIAGNOSIS — C7931 Secondary malignant neoplasm of brain: Secondary | ICD-10-CM

## 2011-04-01 DIAGNOSIS — N949 Unspecified condition associated with female genital organs and menstrual cycle: Secondary | ICD-10-CM | POA: Insufficient documentation

## 2011-04-01 LAB — CBC WITH DIFFERENTIAL/PLATELET
BASO%: 0.2 % (ref 0.0–2.0)
EOS%: 1 % (ref 0.0–7.0)
HCT: 36.5 % (ref 34.8–46.6)
LYMPH%: 24.8 % (ref 14.0–49.7)
MCH: 33.3 pg (ref 25.1–34.0)
MCHC: 35.2 g/dL (ref 31.5–36.0)
MCV: 94.7 fL (ref 79.5–101.0)
MONO#: 0.5 10*3/uL (ref 0.1–0.9)
MONO%: 9.2 % (ref 0.0–14.0)
NEUT%: 64.8 % (ref 38.4–76.8)
Platelets: 264 10*3/uL (ref 145–400)

## 2011-04-01 LAB — COMPREHENSIVE METABOLIC PANEL
ALT: 11 U/L (ref 0–35)
CO2: 24 mEq/L (ref 19–32)
Creatinine, Ser: 0.66 mg/dL (ref 0.50–1.10)
Total Bilirubin: 0.4 mg/dL (ref 0.3–1.2)

## 2011-04-01 LAB — CANCER ANTIGEN 27.29: CA 27.29: 50 U/mL — ABNORMAL HIGH (ref 0–39)

## 2011-04-01 MED ORDER — IOHEXOL 300 MG/ML  SOLN
100.0000 mL | Freq: Once | INTRAMUSCULAR | Status: AC | PRN
  Administered 2011-04-01: 100 mL via INTRAVENOUS

## 2011-04-01 MED ORDER — TECHNETIUM TC 99M MEDRONATE IV KIT
24.0000 | PACK | Freq: Once | INTRAVENOUS | Status: AC | PRN
  Administered 2011-04-01: 24 via INTRAVENOUS

## 2011-04-15 ENCOUNTER — Encounter (HOSPITAL_BASED_OUTPATIENT_CLINIC_OR_DEPARTMENT_OTHER): Payer: Medicare HMO | Admitting: Oncology

## 2011-04-15 ENCOUNTER — Other Ambulatory Visit: Payer: Self-pay | Admitting: Oncology

## 2011-04-15 DIAGNOSIS — Z5111 Encounter for antineoplastic chemotherapy: Secondary | ICD-10-CM

## 2011-04-15 DIAGNOSIS — R197 Diarrhea, unspecified: Secondary | ICD-10-CM

## 2011-04-15 DIAGNOSIS — C50919 Malignant neoplasm of unspecified site of unspecified female breast: Secondary | ICD-10-CM

## 2011-04-15 LAB — CBC WITH DIFFERENTIAL/PLATELET
Basophils Absolute: 0 10*3/uL (ref 0.0–0.1)
EOS%: 4.2 % (ref 0.0–7.0)
HCT: 35.1 % (ref 34.8–46.6)
HGB: 12.2 g/dL (ref 11.6–15.9)
MCH: 33.3 pg (ref 25.1–34.0)
MONO#: 0.5 10*3/uL (ref 0.1–0.9)
NEUT%: 55.3 % (ref 38.4–76.8)
lymph#: 1.4 10*3/uL (ref 0.9–3.3)

## 2011-04-23 ENCOUNTER — Other Ambulatory Visit: Payer: Self-pay | Admitting: Oncology

## 2011-04-23 ENCOUNTER — Ambulatory Visit
Admission: RE | Admit: 2011-04-23 | Discharge: 2011-04-23 | Disposition: A | Discharge status: Home / Self Care | Payer: Medicare HMO | Source: Ambulatory Visit | Attending: Oncology | Admitting: Oncology

## 2011-04-23 DIAGNOSIS — C50919 Malignant neoplasm of unspecified site of unspecified female breast: Secondary | ICD-10-CM

## 2011-04-29 ENCOUNTER — Other Ambulatory Visit: Payer: Self-pay | Admitting: Oncology

## 2011-04-29 ENCOUNTER — Encounter (HOSPITAL_BASED_OUTPATIENT_CLINIC_OR_DEPARTMENT_OTHER): Payer: Medicare HMO | Admitting: Oncology

## 2011-04-29 DIAGNOSIS — C50919 Malignant neoplasm of unspecified site of unspecified female breast: Secondary | ICD-10-CM

## 2011-04-29 DIAGNOSIS — Z5111 Encounter for antineoplastic chemotherapy: Secondary | ICD-10-CM

## 2011-04-29 LAB — COMPREHENSIVE METABOLIC PANEL
AST: 17 U/L (ref 0–37)
Alkaline Phosphatase: 71 U/L (ref 39–117)
BUN: 13 mg/dL (ref 6–23)
Calcium: 9.6 mg/dL (ref 8.4–10.5)
Creatinine, Ser: 0.62 mg/dL (ref 0.50–1.10)

## 2011-04-29 LAB — CBC WITH DIFFERENTIAL/PLATELET
Basophils Absolute: 0 10*3/uL (ref 0.0–0.1)
EOS%: 1.4 % (ref 0.0–7.0)
HCT: 34.9 % (ref 34.8–46.6)
HGB: 12.1 g/dL (ref 11.6–15.9)
MCH: 33.3 pg (ref 25.1–34.0)
MCV: 96.1 fL (ref 79.5–101.0)
MONO%: 9.7 % (ref 0.0–14.0)
NEUT%: 62 % (ref 38.4–76.8)

## 2011-05-27 ENCOUNTER — Other Ambulatory Visit: Payer: Self-pay | Admitting: Oncology

## 2011-05-27 ENCOUNTER — Encounter (HOSPITAL_BASED_OUTPATIENT_CLINIC_OR_DEPARTMENT_OTHER): Payer: Medicare HMO | Admitting: Oncology

## 2011-05-27 DIAGNOSIS — Z5111 Encounter for antineoplastic chemotherapy: Secondary | ICD-10-CM

## 2011-05-27 DIAGNOSIS — C50919 Malignant neoplasm of unspecified site of unspecified female breast: Secondary | ICD-10-CM

## 2011-05-27 DIAGNOSIS — R197 Diarrhea, unspecified: Secondary | ICD-10-CM

## 2011-05-27 LAB — CBC WITH DIFFERENTIAL/PLATELET
Basophils Absolute: 0 10*3/uL (ref 0.0–0.1)
Eosinophils Absolute: 0.1 10*3/uL (ref 0.0–0.5)
HGB: 13 g/dL (ref 11.6–15.9)
MCV: 95.6 fL (ref 79.5–101.0)
MONO#: 0.5 10*3/uL (ref 0.1–0.9)
MONO%: 9.8 % (ref 0.0–14.0)
NEUT#: 2.9 10*3/uL (ref 1.5–6.5)
Platelets: 264 10*3/uL (ref 145–400)
RDW: 12.2 % (ref 11.2–14.5)

## 2011-05-27 LAB — CANCER ANTIGEN 27.29: CA 27.29: 54 U/mL — ABNORMAL HIGH (ref 0–39)

## 2011-05-27 LAB — COMPREHENSIVE METABOLIC PANEL
Albumin: 4.3 g/dL (ref 3.5–5.2)
Alkaline Phosphatase: 73 U/L (ref 39–117)
BUN: 10 mg/dL (ref 6–23)
CO2: 23 mEq/L (ref 19–32)
Calcium: 9.2 mg/dL (ref 8.4–10.5)
Glucose, Bld: 152 mg/dL — ABNORMAL HIGH (ref 70–99)
Potassium: 3.8 mEq/L (ref 3.5–5.3)

## 2011-06-24 ENCOUNTER — Encounter (HOSPITAL_BASED_OUTPATIENT_CLINIC_OR_DEPARTMENT_OTHER): Payer: Medicare HMO | Admitting: Oncology

## 2011-06-24 ENCOUNTER — Other Ambulatory Visit: Payer: Self-pay | Admitting: Oncology

## 2011-06-24 DIAGNOSIS — Z5111 Encounter for antineoplastic chemotherapy: Secondary | ICD-10-CM

## 2011-06-24 DIAGNOSIS — R197 Diarrhea, unspecified: Secondary | ICD-10-CM

## 2011-06-24 DIAGNOSIS — C50919 Malignant neoplasm of unspecified site of unspecified female breast: Secondary | ICD-10-CM

## 2011-06-24 LAB — CBC WITH DIFFERENTIAL/PLATELET
EOS%: 2.4 % (ref 0.0–7.0)
Eosinophils Absolute: 0.1 10*3/uL (ref 0.0–0.5)
LYMPH%: 26 % (ref 14.0–49.7)
MCH: 33.4 pg (ref 25.1–34.0)
MCHC: 35 g/dL (ref 31.5–36.0)
MCV: 95.7 fL (ref 79.5–101.0)
MONO%: 9.5 % (ref 0.0–14.0)
NEUT#: 3.1 10*3/uL (ref 1.5–6.5)
Platelets: 229 10*3/uL (ref 145–400)
RBC: 3.8 10*6/uL (ref 3.70–5.45)

## 2011-06-25 LAB — CELL SEARCH FOR BREAST CANCER

## 2011-07-11 ENCOUNTER — Emergency Department (HOSPITAL_COMMUNITY): Payer: Medicare HMO

## 2011-07-11 ENCOUNTER — Emergency Department (HOSPITAL_COMMUNITY)
Admission: EM | Admit: 2011-07-11 | Discharge: 2011-07-11 | Disposition: A | Discharge status: Home / Self Care | Payer: Medicare HMO | Attending: Emergency Medicine | Admitting: Emergency Medicine

## 2011-07-11 DIAGNOSIS — Z853 Personal history of malignant neoplasm of breast: Secondary | ICD-10-CM | POA: Insufficient documentation

## 2011-07-11 DIAGNOSIS — J45909 Unspecified asthma, uncomplicated: Secondary | ICD-10-CM | POA: Insufficient documentation

## 2011-07-11 DIAGNOSIS — Z9889 Other specified postprocedural states: Secondary | ICD-10-CM | POA: Insufficient documentation

## 2011-07-11 DIAGNOSIS — R197 Diarrhea, unspecified: Secondary | ICD-10-CM | POA: Insufficient documentation

## 2011-07-11 DIAGNOSIS — E119 Type 2 diabetes mellitus without complications: Secondary | ICD-10-CM | POA: Insufficient documentation

## 2011-07-11 DIAGNOSIS — R630 Anorexia: Secondary | ICD-10-CM | POA: Insufficient documentation

## 2011-07-11 DIAGNOSIS — IMO0001 Reserved for inherently not codable concepts without codable children: Secondary | ICD-10-CM | POA: Insufficient documentation

## 2011-07-11 DIAGNOSIS — R109 Unspecified abdominal pain: Secondary | ICD-10-CM | POA: Insufficient documentation

## 2011-07-11 DIAGNOSIS — Z85830 Personal history of malignant neoplasm of bone: Secondary | ICD-10-CM | POA: Insufficient documentation

## 2011-07-11 DIAGNOSIS — K219 Gastro-esophageal reflux disease without esophagitis: Secondary | ICD-10-CM | POA: Insufficient documentation

## 2011-07-11 DIAGNOSIS — R10813 Right lower quadrant abdominal tenderness: Secondary | ICD-10-CM | POA: Insufficient documentation

## 2011-07-11 DIAGNOSIS — Z79899 Other long term (current) drug therapy: Secondary | ICD-10-CM | POA: Insufficient documentation

## 2011-07-11 LAB — URINALYSIS, ROUTINE W REFLEX MICROSCOPIC
Bilirubin Urine: NEGATIVE
Hgb urine dipstick: NEGATIVE
Ketones, ur: NEGATIVE mg/dL
Protein, ur: NEGATIVE mg/dL
Specific Gravity, Urine: 1.007 (ref 1.005–1.030)
Urobilinogen, UA: 0.2 mg/dL (ref 0.0–1.0)

## 2011-07-11 LAB — COMPREHENSIVE METABOLIC PANEL
ALT: 16 U/L (ref 0–35)
Alkaline Phosphatase: 79 U/L (ref 39–117)
BUN: 9 mg/dL (ref 6–23)
CO2: 26 mEq/L (ref 19–32)
Chloride: 105 mEq/L (ref 96–112)
GFR calc Af Amer: >60 mL/min (ref >60)
GFR calc non Af Amer: >60 mL/min (ref >60)
Glucose, Bld: 87 mg/dL (ref 70–99)
Potassium: 3.8 mEq/L (ref 3.5–5.1)
Sodium: 142 mEq/L (ref 135–145)
Total Bilirubin: 0.2 mg/dL — ABNORMAL LOW (ref 0.3–1.2)

## 2011-07-11 LAB — DIFFERENTIAL
Basophils Absolute: 0 10*3/uL (ref 0.0–0.1)
Eosinophils Relative: 3 % (ref 0–5)
Lymphocytes Relative: 19 % (ref 12–46)
Monocytes Absolute: 0.5 10*3/uL (ref 0.1–1.0)

## 2011-07-11 LAB — URINE MICROSCOPIC-ADD ON

## 2011-07-11 LAB — CBC
HCT: 33.6 % — ABNORMAL LOW (ref 36.0–46.0)
MCHC: 33.6 g/dL (ref 30.0–36.0)
MCV: 93.3 fL (ref 78.0–100.0)
RDW: 12.2 % (ref 11.5–15.5)

## 2011-07-22 ENCOUNTER — Other Ambulatory Visit: Payer: Self-pay | Admitting: Oncology

## 2011-07-22 ENCOUNTER — Encounter (HOSPITAL_BASED_OUTPATIENT_CLINIC_OR_DEPARTMENT_OTHER): Payer: Medicare Other | Admitting: Oncology

## 2011-07-22 DIAGNOSIS — C50919 Malignant neoplasm of unspecified site of unspecified female breast: Secondary | ICD-10-CM

## 2011-07-22 DIAGNOSIS — R197 Diarrhea, unspecified: Secondary | ICD-10-CM

## 2011-07-22 DIAGNOSIS — Z5111 Encounter for antineoplastic chemotherapy: Secondary | ICD-10-CM

## 2011-07-22 LAB — COMPREHENSIVE METABOLIC PANEL
AST: 17 U/L (ref 0–37)
BUN: 12 mg/dL (ref 6–23)
CO2: 24 mEq/L (ref 19–32)
Calcium: 9.8 mg/dL (ref 8.4–10.5)
Chloride: 101 mEq/L (ref 96–112)
Creatinine, Ser: 0.65 mg/dL (ref 0.50–1.10)

## 2011-07-22 LAB — CANCER ANTIGEN 27.29: CA 27.29: 52 U/mL — ABNORMAL HIGH (ref 0–39)

## 2011-07-22 LAB — CBC WITH DIFFERENTIAL/PLATELET
Basophils Absolute: 0 10*3/uL (ref 0.0–0.1)
EOS%: 2.3 % (ref 0.0–7.0)
HCT: 37.7 % (ref 34.8–46.6)
HGB: 13.2 g/dL (ref 11.6–15.9)
MCH: 33.3 pg (ref 25.1–34.0)
NEUT%: 57.8 % (ref 38.4–76.8)
Platelets: 249 10*3/uL (ref 145–400)
lymph#: 1.6 10*3/uL (ref 0.9–3.3)

## 2011-08-02 LAB — COMPREHENSIVE METABOLIC PANEL
ALT: 20
Alkaline Phosphatase: 58
CO2: 29
Chloride: 100
GFR calc non Af Amer: >60
Glucose, Bld: 138 — ABNORMAL HIGH
Potassium: 3.9
Sodium: 136

## 2011-08-02 LAB — URINALYSIS, ROUTINE W REFLEX MICROSCOPIC
Bilirubin Urine: NEGATIVE
Ketones, ur: NEGATIVE
Nitrite: NEGATIVE
pH: 5.5

## 2011-08-02 LAB — DIFFERENTIAL
Basophils Relative: 1
Eosinophils Absolute: 0
Monocytes Relative: 7
Neutrophils Relative %: 71

## 2011-08-02 LAB — SAMPLE TO BLOOD BANK

## 2011-08-02 LAB — CBC
Hemoglobin: 13.9
RBC: 4.24

## 2011-08-02 LAB — URINE MICROSCOPIC-ADD ON

## 2011-08-19 ENCOUNTER — Encounter (HOSPITAL_BASED_OUTPATIENT_CLINIC_OR_DEPARTMENT_OTHER): Payer: Medicare Other | Admitting: Oncology

## 2011-08-19 ENCOUNTER — Other Ambulatory Visit: Payer: Self-pay | Admitting: Oncology

## 2011-08-19 DIAGNOSIS — C50919 Malignant neoplasm of unspecified site of unspecified female breast: Secondary | ICD-10-CM

## 2011-08-19 DIAGNOSIS — R197 Diarrhea, unspecified: Secondary | ICD-10-CM

## 2011-08-19 DIAGNOSIS — Z5111 Encounter for antineoplastic chemotherapy: Secondary | ICD-10-CM

## 2011-08-19 LAB — COMPREHENSIVE METABOLIC PANEL
Albumin: 4.5 g/dL (ref 3.5–5.2)
BUN: 12 mg/dL (ref 6–23)
CO2: 28 mEq/L (ref 19–32)
Calcium: 9.6 mg/dL (ref 8.4–10.5)
Chloride: 104 mEq/L (ref 96–112)
Glucose, Bld: 119 mg/dL — ABNORMAL HIGH (ref 70–99)
Potassium: 4 mEq/L (ref 3.5–5.3)
Total Protein: 6.9 g/dL (ref 6.0–8.3)

## 2011-08-19 LAB — CBC WITH DIFFERENTIAL/PLATELET
Basophils Absolute: 0 10*3/uL (ref 0.0–0.1)
Eosinophils Absolute: 0.1 10*3/uL (ref 0.0–0.5)
HGB: 12.3 g/dL (ref 11.6–15.9)
MCV: 95.5 fL (ref 79.5–101.0)
MONO#: 0.4 10*3/uL (ref 0.1–0.9)
MONO%: 9.1 % (ref 0.0–14.0)
NEUT#: 2.7 10*3/uL (ref 1.5–6.5)
RDW: 12.9 % (ref 11.2–14.5)
WBC: 4.7 10*3/uL (ref 3.9–10.3)
lymph#: 1.4 10*3/uL (ref 0.9–3.3)

## 2011-08-19 LAB — CANCER ANTIGEN 27.29: CA 27.29: 52 U/mL — ABNORMAL HIGH (ref 0–39)

## 2011-08-23 LAB — CELL SEARCH FOR BREAST CANCER

## 2011-09-14 ENCOUNTER — Other Ambulatory Visit: Payer: Self-pay | Admitting: Oncology

## 2011-09-14 DIAGNOSIS — Z853 Personal history of malignant neoplasm of breast: Secondary | ICD-10-CM

## 2011-09-16 ENCOUNTER — Other Ambulatory Visit: Payer: Self-pay | Admitting: Oncology

## 2011-09-16 ENCOUNTER — Ambulatory Visit (HOSPITAL_BASED_OUTPATIENT_CLINIC_OR_DEPARTMENT_OTHER): Payer: Medicare Other

## 2011-09-16 ENCOUNTER — Other Ambulatory Visit (HOSPITAL_BASED_OUTPATIENT_CLINIC_OR_DEPARTMENT_OTHER): Payer: Medicare Other | Admitting: Lab

## 2011-09-16 DIAGNOSIS — D0590 Unspecified type of carcinoma in situ of unspecified breast: Secondary | ICD-10-CM

## 2011-09-16 DIAGNOSIS — C50919 Malignant neoplasm of unspecified site of unspecified female breast: Secondary | ICD-10-CM

## 2011-09-16 DIAGNOSIS — Z853 Personal history of malignant neoplasm of breast: Secondary | ICD-10-CM

## 2011-09-16 LAB — CANCER ANTIGEN 27.29: CA 27.29: 53 U/mL — ABNORMAL HIGH (ref 0–39)

## 2011-09-16 LAB — CBC WITH DIFFERENTIAL/PLATELET
BASO%: 0.8 % (ref 0.0–2.0)
EOS%: 2.6 % (ref 0.0–7.0)
MCH: 32.9 pg (ref 25.1–34.0)
MCHC: 34.2 g/dL (ref 31.5–36.0)
MCV: 96.2 fL (ref 79.5–101.0)
MONO%: 9.6 % (ref 0.0–14.0)
RDW: 12.8 % (ref 11.2–14.5)
lymph#: 2 10*3/uL (ref 0.9–3.3)

## 2011-09-16 LAB — COMPREHENSIVE METABOLIC PANEL
CO2: 28 mEq/L (ref 19–32)
Creatinine, Ser: 0.79 mg/dL (ref 0.50–1.10)
Glucose, Bld: 99 mg/dL (ref 70–99)
Total Bilirubin: 0.3 mg/dL (ref 0.3–1.2)
Total Protein: 7.4 g/dL (ref 6.0–8.3)

## 2011-09-16 MED ORDER — FULVESTRANT 250 MG/5ML IM SOLN
500.0000 mg | Freq: Once | INTRAMUSCULAR | Status: AC
  Administered 2011-09-16: 500 mg via INTRAMUSCULAR
  Filled 2011-09-16: qty 10

## 2011-09-23 ENCOUNTER — Telehealth: Payer: Self-pay | Admitting: Oncology

## 2011-09-23 NOTE — Telephone Encounter (Signed)
per of 06/27 called pt and informed her that I finished her appt for jan-may2013.  pt asked that I mail appts to her

## 2011-10-14 ENCOUNTER — Ambulatory Visit: Payer: Medicare Other

## 2011-10-14 ENCOUNTER — Other Ambulatory Visit: Payer: Self-pay | Admitting: *Deleted

## 2011-10-14 ENCOUNTER — Other Ambulatory Visit: Payer: Medicare Other | Admitting: Lab

## 2011-10-14 ENCOUNTER — Telehealth: Payer: Self-pay | Admitting: *Deleted

## 2011-10-14 NOTE — Telephone Encounter (Signed)
Pt left message stating " I need to reschedule my appt for injection due to not feeling well ".  This RN returned call and received A/M - message left stating above will be accomadated.  Request sent to scheduling.

## 2011-10-16 ENCOUNTER — Emergency Department (INDEPENDENT_AMBULATORY_CARE_PROVIDER_SITE_OTHER): Payer: Medicare Other

## 2011-10-16 ENCOUNTER — Emergency Department (INDEPENDENT_AMBULATORY_CARE_PROVIDER_SITE_OTHER)
Admission: EM | Admit: 2011-10-16 | Discharge: 2011-10-16 | Disposition: A | Discharge status: Home / Self Care | Payer: Medicare Other | Source: Home / Self Care | Attending: Family Medicine | Admitting: Family Medicine

## 2011-10-16 ENCOUNTER — Encounter (HOSPITAL_COMMUNITY): Payer: Self-pay | Admitting: *Deleted

## 2011-10-16 DIAGNOSIS — J45909 Unspecified asthma, uncomplicated: Secondary | ICD-10-CM

## 2011-10-16 DIAGNOSIS — J069 Acute upper respiratory infection, unspecified: Secondary | ICD-10-CM

## 2011-10-16 MED ORDER — ACETAMINOPHEN 325 MG PO TABS
ORAL_TABLET | ORAL | Status: AC
  Filled 2011-10-16: qty 2

## 2011-10-16 MED ORDER — AZITHROMYCIN 250 MG PO TABS: 250.0000 mg | ORAL_TABLET | Freq: Every day | ORAL | Status: AC

## 2011-10-16 MED ORDER — ACETAMINOPHEN 325 MG PO TABS
650.0000 mg | ORAL_TABLET | Freq: Once | ORAL | Status: AC
  Administered 2011-10-16: 650 mg via ORAL

## 2011-10-16 MED ORDER — AMOXICILLIN 500 MG PO CAPS: 500.0000 mg | ORAL_CAPSULE | Freq: Three times a day (TID) | ORAL | Status: DC

## 2011-10-16 MED ORDER — PREDNISONE 20 MG PO TABS: ORAL_TABLET | ORAL | Status: AC

## 2011-10-16 MED ORDER — HYDROCODONE-ACETAMINOPHEN 7.5-325 MG/15ML PO SOLN: 15.0000 mL | Freq: Three times a day (TID) | ORAL | Status: AC | PRN

## 2011-10-16 NOTE — ED Notes (Signed)
Pt with onset of fever /cough/congestion 12/26 seen by own md thurs azithromycin 3 pack given has taken 2 tabs - not feeling any better

## 2011-10-16 NOTE — ED Provider Notes (Signed)
History     CSN: [id number]  Arrival date & time 10/16/11  0904   First MD Initiated Contact with Patient 10/16/11 [phone]      Chief Complaint  Patient presents with  . Fever  . Cough  . Nasal Congestion    (Consider location/radiation/quality/duration/timing/severity/associated sxs/prior treatment) HPI Comments: 75 Y/o female with multiple comorbidity including diabetes, asthma and anxiety here c/o fever, dry cough, nasal congestion, sore throat and body aches for 3 days. Denies chest pain or difficulty breathing. Was seen at her PCP office 2 days ago was "given 3 antibiotic pills" no taking any other medications for fever or pain today. Got her flu shot this year. No nausea vomiting or diarrhea. Non smoker but grew up in and a house of smokers as child.    Past Medical History  Diagnosis Date  . Allergy     seasonal  . Anxiety   . Arthritis   . Asthma   . Cancer     breast   . Depression   . Diabetes mellitus   . GERD (gastroesophageal reflux disease)   . Heart murmur   . Hyperlipidemia     Past Surgical History  Procedure Date  . Mastectomy     left  and then treated with chemo and radiation  . Abdominal hysterectomy     total  . Back surgery     synovial cyst removed from spine  . Carpal tunnel release     bilateral  . Colonoscopy   . Polypectomy   . Upper gastrointestinal endoscopy   . Appendectomy     History reviewed. No pertinent family history.  History  Substance Use Topics  . Smoking status: Never Smoker   . Smokeless tobacco: Never Used  . Alcohol Use: No    OB History    Grav Para Term Preterm Abortions TAB SAB Ect Mult Living                  Review of Systems  Constitutional: Positive for fever and appetite change.  HENT: Positive for congestion, sore throat and rhinorrhea. Negative for trouble swallowing, neck pain, voice change and sinus pressure.   Respiratory: Positive for cough. Negative for shortness of breath and wheezing.     Cardiovascular: Negative for chest pain and leg swelling.  Gastrointestinal: Negative for nausea, vomiting and diarrhea.  Skin: Negative for rash.  Neurological: Negative for dizziness and headaches.    Allergies  Niacin and Promethazine hcl  Home Medications   Current Outpatient Rx  Name Route Sig Dispense Refill  . BUDESONIDE-FORMOTEROL FUMARATE 80-4.5 MCG/ACT IN AERO Inhalation Inhale 2 puffs into the lungs 2 (two) times daily.      Marland Kitchen BYSTOLIC 5 MG PO TABS Oral Take 1 tablet by mouth Daily.    Marland Kitchen CLONAZEPAM 0.5 MG PO TABS Oral Take 0.5 mg by mouth 2 (two) times daily.      . CRESTOR 5 MG PO TABS Oral Take 1 tablet by mouth Daily.    Marland Kitchen DICYCLOMINE HCL 10 MG PO CAPS Oral Take 10 mg by mouth 2 times daily at 12 noon and 4 pm.      . GLIMEPIRIDE 1 MG PO TABS Oral Take 1 tablet by mouth Daily.    Marland Kitchen HYDROCHLOROTHIAZIDE PO Oral Take 75 mg by mouth as needed. swelling     . MECLIZINE HCL 25 MG PO TABS Oral Take 1 tablet by mouth as needed. dizziness    . NASONEX 50 MCG/ACT NA  SUSP Nasal 1 spray by Nasal route at bedtime.    Marland Kitchen NEXIUM 40 MG PO CPDR Oral Take 1 tablet by mouth Daily.    Marland Kitchen PEG-KCL-NACL-NASULF-NA ASC-C 100 G PO SOLR  MOVI PREP take as directed 1 kit 0    Dispense as written.  . SUCRALFATE 1 G PO TABS Oral Take 1 g by mouth as needed. Abdominal discomfort     . AZITHROMYCIN 250 MG PO TABS Oral Take 1 tablet (250 mg total) by mouth daily. Take first 2 tablets together, then 1 every day until finished. 6 tablet 0  . BUDESONIDE 180 MCG/ACT IN AEPB Inhalation Inhale 2 puffs into the lungs at bedtime.      Marland Kitchen HYDROCODONE-ACETAMINOPHEN 7.5-325 MG/15ML PO SOLN Oral Take 15 mLs by mouth every 8 (eight) hours as needed for pain (or cough). 120 mL 0  . METFORMIN HCL 500 MG PO TABS Oral Take 1 tablet by mouth Daily.    Marland Kitchen PIRBUTEROL ACETATE 200 MCG/INH IN AERB Inhalation Inhale 1 puff into the lungs at bedtime.      Marland Kitchen PREDNISONE 20 MG PO TABS  2 tabs po daily for 5 days 10 tablet 0    BP  135/64  Pulse 106  Temp(Src) 100.7 F (38.2 C) (Oral)  SpO2 96%  Physical Exam  Nursing note and vitals reviewed. Constitutional: She is oriented to person, place, and time. She appears well-developed and well-nourished. No distress.  HENT:  Head: Normocephalic and atraumatic. No trismus in the jaw.  Right Ear: Tympanic membrane normal.  Left Ear: Tympanic membrane normal.  Nose: Mucosal edema and rhinorrhea present. Right sinus exhibits no maxillary sinus tenderness and no frontal sinus tenderness. Left sinus exhibits no maxillary sinus tenderness and no frontal sinus tenderness.  Mouth/Throat: Uvula is midline, oropharynx is clear and moist and mucous membranes are normal. No oropharyngeal exudate.  Eyes: Conjunctivae are normal. Right eye exhibits no discharge. Left eye exhibits no discharge.  Neck: Neck supple. No JVD present.  Cardiovascular: Normal heart sounds.        Mild tachycardia with regular rithm.  Pulmonary/Chest: Effort normal and breath sounds normal. No respiratory distress. She has no wheezes. She has no rales. She exhibits no tenderness.  Abdominal: Soft. She exhibits no distension. There is no tenderness.  Lymphadenopathy:    She has no cervical adenopathy.  Neurological: She is alert and oriented to person, place, and time.  Skin: No rash noted.    ED Course  Procedures (including critical care time)  Labs Reviewed - No data to display Dg Chest 2 View  10/16/2011  *RADIOLOGY REPORT*  Clinical Data: Fever and cough  CHEST - 2 VIEW  Comparison: CT chest 04/01/2011 chest radiograph 10/27/2010.  Findings: Left mastectomy.  Surgical clips left axilla.  Heart, mediastinal,l and hilar contours are within normal limits and stable.  Interstitial prominence of the left lung apex likely reflects radiation changes as seen on prior chest CT.  No airspace disease, effusion, or pneumothorax.  Scattered foci of sclerosis in the thoracic spine are consistent with known sclerotic  bony metastasis.  No evidence of vertebral body collapse.  IMPRESSION:  1.  No acute cardiopulmonary disease.  Stable radiation changes left lung apex. 2.  Sclerotic bony metastasis as seen on prior CTs, in this patient with history of left breast cancer.  Original Report Authenticated By: Britta Mccreedy, [name].     1. URI (upper respiratory infection)   2. Asthma       MDM  Likely viral URI causing asthma exacerbation. Added prednisone and gave her prescription for hydrocodone and also to complete azithromycin cycle as she had previously prescribed.      Sharin Grave, MD 10/17/11 [phone]

## 2011-10-19 DIAGNOSIS — K635 Polyp of colon: Secondary | ICD-10-CM

## 2011-10-19 HISTORY — DX: Polyp of colon: K63.5

## 2011-10-19 HISTORY — PX: COLONOSCOPY W/ BIOPSIES: SHX1374

## 2011-10-22 ENCOUNTER — Ambulatory Visit (HOSPITAL_BASED_OUTPATIENT_CLINIC_OR_DEPARTMENT_OTHER): Payer: Medicare Other

## 2011-10-22 ENCOUNTER — Other Ambulatory Visit (HOSPITAL_BASED_OUTPATIENT_CLINIC_OR_DEPARTMENT_OTHER): Payer: Medicare Other | Admitting: Lab

## 2011-10-22 ENCOUNTER — Other Ambulatory Visit: Payer: Self-pay | Admitting: Oncology

## 2011-10-22 VITALS — BP 127/71 | HR 69 | Temp 97.2°F

## 2011-10-22 DIAGNOSIS — D0590 Unspecified type of carcinoma in situ of unspecified breast: Secondary | ICD-10-CM

## 2011-10-22 DIAGNOSIS — C50919 Malignant neoplasm of unspecified site of unspecified female breast: Secondary | ICD-10-CM

## 2011-10-22 DIAGNOSIS — R197 Diarrhea, unspecified: Secondary | ICD-10-CM

## 2011-10-22 DIAGNOSIS — Z5111 Encounter for antineoplastic chemotherapy: Secondary | ICD-10-CM

## 2011-10-22 LAB — COMPREHENSIVE METABOLIC PANEL
ALT: 14 U/L (ref 0–35)
CO2: 27 mEq/L (ref 19–32)
Creatinine, Ser: 0.84 mg/dL (ref 0.50–1.10)
Total Bilirubin: 0.2 mg/dL — ABNORMAL LOW (ref 0.3–1.2)

## 2011-10-22 LAB — CBC WITH DIFFERENTIAL/PLATELET
BASO%: 0.3 % (ref 0.0–2.0)
EOS%: 1 % (ref 0.0–7.0)
HCT: 36.9 % (ref 34.8–46.6)
LYMPH%: 16.5 % (ref 14.0–49.7)
MCH: 32.7 pg (ref 25.1–34.0)
MCHC: 34.2 g/dL (ref 31.5–36.0)
MONO#: 0.7 10*3/uL (ref 0.1–0.9)
NEUT%: 75.3 % (ref 38.4–76.8)
Platelets: 372 10*3/uL (ref 145–400)

## 2011-10-22 LAB — CANCER ANTIGEN 27.29: CA 27.29: 52 U/mL — ABNORMAL HIGH (ref 0–39)

## 2011-10-22 MED ORDER — FULVESTRANT 250 MG/5ML IM SOLN
500.0000 mg | Freq: Once | INTRAMUSCULAR | Status: AC
  Administered 2011-10-22: 500 mg via INTRAMUSCULAR
  Filled 2011-10-22: qty 10

## 2011-11-11 ENCOUNTER — Encounter: Payer: Self-pay | Admitting: Physician Assistant

## 2011-11-11 ENCOUNTER — Telehealth: Payer: Self-pay | Admitting: Oncology

## 2011-11-11 ENCOUNTER — Other Ambulatory Visit (HOSPITAL_BASED_OUTPATIENT_CLINIC_OR_DEPARTMENT_OTHER): Payer: Medicare Other | Admitting: Lab

## 2011-11-11 ENCOUNTER — Ambulatory Visit (HOSPITAL_BASED_OUTPATIENT_CLINIC_OR_DEPARTMENT_OTHER): Payer: Medicare Other | Admitting: Physician Assistant

## 2011-11-11 VITALS — BP 129/70 | HR 82 | Temp 97.7°F | Ht 64.0 in | Wt 157.0 lb

## 2011-11-11 DIAGNOSIS — M949 Disorder of cartilage, unspecified: Secondary | ICD-10-CM

## 2011-11-11 DIAGNOSIS — D0590 Unspecified type of carcinoma in situ of unspecified breast: Secondary | ICD-10-CM

## 2011-11-11 DIAGNOSIS — C50519 Malignant neoplasm of lower-outer quadrant of unspecified female breast: Secondary | ICD-10-CM

## 2011-11-11 DIAGNOSIS — C50919 Malignant neoplasm of unspecified site of unspecified female breast: Secondary | ICD-10-CM

## 2011-11-11 LAB — CBC WITH DIFFERENTIAL/PLATELET
Basophils Absolute: 0 10*3/uL (ref 0.0–0.1)
EOS%: 4.7 % (ref 0.0–7.0)
Eosinophils Absolute: 0.2 10*3/uL (ref 0.0–0.5)
HCT: 35.1 % (ref 34.8–46.6)
HGB: 12.1 g/dL (ref 11.6–15.9)
MCH: 32.7 pg (ref 25.1–34.0)
MONO#: 0.4 10*3/uL (ref 0.1–0.9)
NEUT#: 2.4 10*3/uL (ref 1.5–6.5)
NEUT%: 59.9 % (ref 38.4–76.8)
lymph#: 1 10*3/uL (ref 0.9–3.3)

## 2011-11-11 LAB — CANCER ANTIGEN 27.29: CA 27.29: 47 U/mL — ABNORMAL HIGH (ref 0–39)

## 2011-11-11 LAB — COMPREHENSIVE METABOLIC PANEL
Albumin: 4.4 g/dL (ref 3.5–5.2)
BUN: 12 mg/dL (ref 6–23)
CO2: 26 mEq/L (ref 19–32)
Calcium: 9.3 mg/dL (ref 8.4–10.5)
Chloride: 103 mEq/L (ref 96–112)
Creatinine, Ser: 0.83 mg/dL (ref 0.50–1.10)
Glucose, Bld: 118 mg/dL — ABNORMAL HIGH (ref 70–99)

## 2011-11-11 NOTE — Telephone Encounter (Signed)
gve the pt her jan-April 2013 appt calendar °

## 2011-11-11 NOTE — Progress Notes (Signed)
Hematology and Oncology Follow Up Visit  Jamie [name] [id number] 05-08-1936 76 y.o. 11/11/2011    HPI: The patient's cancer was uncovered by an unusually circuitous route but after all this was a lobular and those certainly can hide.  At any rate she presented with stomach upset.  Because of a history of colitis, Dr. Dorena [name] who happened to be on call for Dr. Matthias [name], obtained CT of the abdomen and pelvis on August 30, 2005.  As far as the abdomen and pelvis were concerned, there was no evidence of colitis but there were multiple scattered sclerotic lesions in the bone suggesting possible sclerotic metastatic disease (versus osteopoikilosis.)  Accordingly a bone scan was obtained.  This was done on September 01, 2005, and found tiny sclerotic lesions elsewhere were not well-seen but there was a lesion of concern in the sternum.  Accordingly a CT scan of the chest was obtained.  This found the sternal problem to correspond with degenerative changes. However, this showed a soft tissue nodule in the left breast.  The patient then had mammograms on September 27, 2005.  This found two areas of spiculation and architectural distortion in the upper outer quadrant of the left breast.  By ultrasound these were hyperechoic and irregular, highly suspicious for carcinoma.  Biopsy was obtained the same day and showed ([id number], as well as [phone]) invasive lobular carcinoma, with both lesions biopsied being ER and PR positive, both HER-2 positive, both negative by FISH.  Accordingly the patient was referred to Dr. Corliss [name] and breast MRI was obtained October 06, 2005.  No abnormal enhancement was noted in the right breast.  There was enhancing nodularity in the upper portion of the left breast where several discrete nodules were seen.  Accordingly this case was initially presented and decision for mastectomy was made.  Dr. Corliss [name] performed a left modified radical mastectomy on November 02, 2005.  The  patient had a positive sentinel lymph node and Dr. Corliss [name] performed a left axillary lymph node dissection.  However, only three additional lymph nodes were recovered, two of which also showed metastatic involvement (all had extra capsular extension.)    Patient was treated with radiation therapy, given concurrently with Xeloda. Radiation was completed in April of 2007, at which time the patient was started on anastrozole. She was switched from anastrozole to eczema stain, but both were subsequently discontinued due to bony aches and pains. She is on tamoxifen between October 2007 until August of 2011 at which time it was discontinued secondary to cramps. She was also found to have multiple sclerotic bony lesions at that time. These have been biopsied x2, but without a definitive diagnosis.  Patient has been receiving fulvestrant injections on monthly basis in September 2011. Also receive zoledronic acid on an annual basis, last given in March 2012.  Interim History:   Jamie [name] returns today for followup of her breast carcinoma. Although she is feeling well today, she tells me she's had a rough few weeks. She was diagnosed with bronchitis back in December and was "really sick". She was on antibiotics, in addition to prednisone. Finally, she is feeling better. She's had no recent fevers, chills, or night sweats. The cough is almost completely resolved. She's had no phlegm production and no increased shortness of breath. As a result of her illness in December, her fulvestrant injection was delayed by one week, so she is actually due for her next injection next week on January 31, rather than today.  Jamie [name]  continues to have some back pain which comes and goes. She does not believe this has worsened. She's had no hot flashes.  No increased joint pain. No vaginal dryness, and no vaginal bleeding. She continues to have some anxiety and depression, although these appear to be relatively well-controlled at the  current time, and she denies any suicidal ideations.  A detailed review of systems is otherwise noncontributory as noted below.  Review of Systems: Constitutional: fatigue,  no weight loss, fever, night sweats Eyes: uses glasses [id number] Cardiovascular: no chest pain or dyspnea on exertion Respiratory: no cough, shortness of breath, or wheezing Neurological: no TIA or stroke symptoms negative Dermatological: negative Gastrointestinal: no abdominal pain, nausea, change in bowel habits, or black or bloody stools Genito-Urinary: no dysuria, trouble voiding, or hematuria Hematological and Lymphatic: negative Breast: negative Musculoskeletal: positive for - pain in back - generalized Remaining ROS negative.   PAST MEDICAL HISTORY:  Significant for diabetes, hypercholesterolemia, asthma, mitral valve prolapse requiring antibiotic prophylaxis before any invasive procedures, Meniere's disease, gastroesophageal reflux disease, osteoarthritis, degenerative disk disease, history of fibromyalgia, panic attacks, history of synovial cyst removal, history of total hysterectomy with bilateral salpingo-oophorectomy, history of septoplasty x2, status post appendectomy, status post dilatation and curettage remotely, status post bilateral carpal tunnel release under Dr. Rosanne Ashing [name].  FAMILY HISTORY:  The patient's father died at the age of 51 from a stroke.  The patient's mother died at the age of 87, also from a stroke.  The patient has three brothers; one died with "liver disease."  One has prostate cancer.  The other brother has prostate and colon cancer.  The only breast cancer in the family was the patient's mother's mother and she does not know how old her grandmother was when she was diagnosed.  There is no history of ovarian cancer in the family.  GYNECOLOGIC HISTORY:  She is GXP2.  She had her hysterectomy while still menstruating and she took hormone replacement until December 2006, when she  had her breast biopsy.  SOCIAL HISTORY:  She works as a Lawyer.  Her husband Jamie [name] (who is the son of my former patient Hessie Lansberry) is semiretired, working for a Financial risk analyst.  What he likes to do is hunt and fish.  They have two children; Iantha Fallen is 21, he has significant muscular dystrophy and lives at home with 24/7 care and they have a daughter Marylu Lund, 26 years old, lives in [place] and works in an office.  The patient has two granddaughters.   Medications:   I have reviewed the patient's current medications.  Current Outpatient Prescriptions  Medication Sig Dispense Refill  . budesonide (PULMICORT FLEXHALER) 180 MCG/ACT inhaler Inhale 2 puffs into the lungs at bedtime.        . budesonide-formoterol (SYMBICORT) 80-4.5 MCG/ACT inhaler Inhale 2 puffs into the lungs 2 (two) times daily.        Marland Kitchen BYSTOLIC 5 MG tablet Take 1 tablet by mouth Daily.      . clonazePAM (KLONOPIN) 0.5 MG tablet Take 0.5 mg by mouth 2 (two) times daily.        . CRESTOR 5 MG tablet Take 1 tablet by mouth Daily.      Marland Kitchen dicyclomine (BENTYL) 10 MG capsule Take 10 mg by mouth 2 times daily at 12 noon and 4 pm.        . glimepiride (AMARYL) 1 MG tablet Take 1 tablet by mouth Daily.      Marland Kitchen HYDROCHLOROTHIAZIDE PO Take 75 mg  by mouth as needed. swelling       . meclizine (ANTIVERT) 25 MG tablet Take 1 tablet by mouth as needed. dizziness      . metFORMIN (GLUCOPHAGE) 500 MG tablet Take 1 tablet by mouth Daily.      Marland Kitchen NASONEX 50 MCG/ACT nasal spray 1 spray by Nasal route at bedtime.      Marland Kitchen NEXIUM 40 MG capsule Take 1 tablet by mouth Daily.      . peg 3350 powder (MOVIPREP) 100 G SOLR MOVI PREP take as directed  1 kit  0  . pirbuterol (MAXAIR) 200 MCG/INH inhaler Inhale 1 puff into the lungs at bedtime.        . sucralfate (CARAFATE) 1 G tablet Take 1 g by mouth as needed. Abdominal discomfort         Allergies:  Allergies  Allergen Reactions  . Niacin   . Promethazine Hcl     Physical Exam: Filed Vitals:    11/11/11 1117  BP: 129/70  Pulse: 82  Temp: 97.7 F (36.5 C)   HEENT:  Sclerae anicteric, conjunctivae pink.  Oropharynx clear.  No mucositis or candidiasis.   Nodes:  No cervical, supraclavicular, or axillary lymphadenopathy palpated.  Breast Exam:  Right breast is benign, no masses, skin changes, or nipple inversion. Patient is status post left mastectomy. Telangiectasias noted status post radiation but no additional skin changes. No suspicious nodularity, and no evidence of local recurrence in the chest wall.  Lungs:  Clear to auscultation bilaterally.  No crackles, rhonchi, or wheezes.   Heart:  Regular rate and rhythm.   Abdomen:  Soft, nontender.  Positive bowel sounds.  No organomegaly or masses palpated.   Musculoskeletal:  No focal spinal tenderness to palpation.  Extremities:  Benign.  No peripheral edema or cyanosis.   Skin:  Benign.   Neuro:  Nonfocal.   Lab Results: Lab Results  Component Value Date   WBC 4.1 11/11/2011   HGB 12.1 11/11/2011   HCT 35.1 11/11/2011   MCV 95.2 11/11/2011   PLT 250 11/11/2011   NEUTROABS 2.4 11/11/2011     Chemistry      Component Value Date/Time   NA 139 10/22/2011 1056   K 4.3 10/22/2011 1056   CL 99 10/22/2011 1056   CO2 27 10/22/2011 1056   BUN 16 10/22/2011 1056   CREATININE 0.84 10/22/2011 1056      Component Value Date/Time   CALCIUM 9.7 10/22/2011 1056   ALKPHOS 84 10/22/2011 1056   AST 10 10/22/2011 1056   ALT 14 10/22/2011 1056   BILITOT 0.2* 10/22/2011 1056     CA27-29 has remained elevated although stable for the most part over the last several months, at 52 on 08/19/2011, 53 on 09/16/2011 and 52 on 10/22/2011.  Dr. Darnelle Catalan also checked a Cell Search for a baseline on 06/24/2011, which was elevated at 35.  The cell search will be repeated with her next lab draw in February.   Radiological Studies:  Dg Chest 2 View  10/16/2011  *RADIOLOGY REPORT*  Clinical Data: Fever and cough  CHEST - 2 VIEW  Comparison: CT chest 04/01/2011 chest  radiograph 10/27/2010.  Findings: Left mastectomy.  Surgical clips left axilla.  Heart, mediastinal,l and hilar contours are within normal limits and stable.  Interstitial prominence of the left lung apex likely reflects radiation changes as seen on prior chest CT.  No airspace disease, effusion, or pneumothorax.  Scattered foci of sclerosis in the thoracic spine are  consistent with known sclerotic bony metastasis.  No evidence of vertebral body collapse.    IMPRESSION:  1.  No acute cardiopulmonary disease.  Stable radiation changes left lung apex. 2.  Sclerotic bony metastasis as seen on prior CTs, in this patient with history of left breast cancer.  Original Report Authenticated By: Britta Mccreedy, [name].     Assessment:  A 76 year old [place] woman   (1)  status post left modified radical mastectomy in January 2007 for a T1c N1, grade 1 invasive lobular carcinoma, strongly ER, PR-positive with a low MIB-1.  Status post radiation given concurrently with Capecitabine.    (2)  Status post anastrozole and exemestane, both discontinued due to aches and pains.    (3)  Status post tamoxifen between October 2007 until August 2011.  Discontinued secondary to cramps   (4) In 2011, multiple sclerotic bony lesions.  These have been biopsied x2 without a definitive diagnosis.    (5)  On fulvestrant since September 2011, continuing on a monthly basis.    (6)  Receives zoledronic acid annually with good tolerance, last given in March 2012.   Plan:  With regards to her breast cancer, Urijah seems to be doing well. Of course her tumor marker continues to be elevated, but is extremely stable, as is her clinical presentation.  She'll return next week as scheduled for her next injection of fulvestrant. She'll receive fulvestrant again on February 28, and return on March 28 for both zoledronic acid and fulvestrant. She'll see Dr. Darnelle Catalan for routine three-month followup in April.  This plan was reviewed  with the patient, who voices understanding and agreement.  She knows to call with any changes or problems.    Martita Brumm, PA-C 11/11/2011

## 2011-11-18 ENCOUNTER — Ambulatory Visit (HOSPITAL_BASED_OUTPATIENT_CLINIC_OR_DEPARTMENT_OTHER): Payer: Medicare Other

## 2011-11-18 DIAGNOSIS — Z5111 Encounter for antineoplastic chemotherapy: Secondary | ICD-10-CM

## 2011-11-18 DIAGNOSIS — Z853 Personal history of malignant neoplasm of breast: Secondary | ICD-10-CM

## 2011-11-18 DIAGNOSIS — D126 Benign neoplasm of colon, unspecified: Secondary | ICD-10-CM

## 2011-11-18 DIAGNOSIS — C50219 Malignant neoplasm of upper-inner quadrant of unspecified female breast: Secondary | ICD-10-CM

## 2011-11-18 MED ORDER — FULVESTRANT 250 MG/5ML IM SOLN
500.0000 mg | Freq: Once | INTRAMUSCULAR | Status: AC
  Administered 2011-11-18: 500 mg via INTRAMUSCULAR
  Filled 2011-11-18: qty 10

## 2011-11-29 ENCOUNTER — Other Ambulatory Visit: Payer: Self-pay | Admitting: Endocrinology

## 2011-11-29 DIAGNOSIS — E041 Nontoxic single thyroid nodule: Secondary | ICD-10-CM

## 2011-12-02 ENCOUNTER — Other Ambulatory Visit: Payer: Medicare Other

## 2011-12-06 ENCOUNTER — Ambulatory Visit
Admission: RE | Admit: 2011-12-06 | Discharge: 2011-12-06 | Disposition: A | Discharge status: Home / Self Care | Payer: Medicare Other | Source: Ambulatory Visit | Attending: Endocrinology | Admitting: Endocrinology

## 2011-12-06 DIAGNOSIS — E041 Nontoxic single thyroid nodule: Secondary | ICD-10-CM

## 2011-12-09 ENCOUNTER — Other Ambulatory Visit: Payer: Medicare Other | Admitting: Lab

## 2011-12-09 ENCOUNTER — Ambulatory Visit: Payer: Medicare Other

## 2011-12-16 ENCOUNTER — Ambulatory Visit (HOSPITAL_BASED_OUTPATIENT_CLINIC_OR_DEPARTMENT_OTHER): Payer: Medicare Other

## 2011-12-16 ENCOUNTER — Other Ambulatory Visit (HOSPITAL_BASED_OUTPATIENT_CLINIC_OR_DEPARTMENT_OTHER): Payer: Medicare Other | Admitting: Lab

## 2011-12-16 VITALS — BP 139/74 | HR 80 | Temp 97.4°F

## 2011-12-16 DIAGNOSIS — Z5111 Encounter for antineoplastic chemotherapy: Secondary | ICD-10-CM

## 2011-12-16 DIAGNOSIS — D0590 Unspecified type of carcinoma in situ of unspecified breast: Secondary | ICD-10-CM

## 2011-12-16 LAB — COMPREHENSIVE METABOLIC PANEL
ALT: 14 U/L (ref 0–35)
Albumin: 4.6 g/dL (ref 3.5–5.2)
Alkaline Phosphatase: 79 U/L (ref 39–117)
CO2: 27 mEq/L (ref 19–32)
Glucose, Bld: 150 mg/dL — ABNORMAL HIGH (ref 70–99)
Potassium: 4.2 mEq/L (ref 3.5–5.3)
Sodium: 141 mEq/L (ref 135–145)
Total Bilirubin: 0.3 mg/dL (ref 0.3–1.2)
Total Protein: 7.1 g/dL (ref 6.0–8.3)

## 2011-12-16 LAB — CANCER ANTIGEN 27.29: CA 27.29: 44 U/mL — ABNORMAL HIGH (ref 0–39)

## 2011-12-16 LAB — CBC WITH DIFFERENTIAL/PLATELET
Basophils Absolute: 0 10*3/uL (ref 0.0–0.1)
EOS%: 1.7 % (ref 0.0–7.0)
Eosinophils Absolute: 0.1 10*3/uL (ref 0.0–0.5)
HGB: 13.6 g/dL (ref 11.6–15.9)
NEUT#: 3.3 10*3/uL (ref 1.5–6.5)
RBC: 4.25 10*6/uL (ref 3.70–5.45)
RDW: 12.3 % (ref 11.2–14.5)
lymph#: 1.4 10*3/uL (ref 0.9–3.3)

## 2011-12-16 MED ORDER — FULVESTRANT 250 MG/5ML IM SOLN
500.0000 mg | Freq: Once | INTRAMUSCULAR | Status: AC
Start: 2011-12-16 — End: 2011-12-16
  Administered 2011-12-16: 500 mg via INTRAMUSCULAR
  Filled 2011-12-16: qty 10

## 2011-12-18 LAB — CELL SEARCH FOR BREAST CANCER

## 2011-12-29 ENCOUNTER — Encounter: Payer: Self-pay | Admitting: Internal Medicine

## 2012-01-06 ENCOUNTER — Other Ambulatory Visit: Payer: Medicare Other | Admitting: Lab

## 2012-01-06 ENCOUNTER — Ambulatory Visit: Payer: Medicare Other

## 2012-01-13 ENCOUNTER — Ambulatory Visit (HOSPITAL_BASED_OUTPATIENT_CLINIC_OR_DEPARTMENT_OTHER): Payer: Medicare Other

## 2012-01-13 ENCOUNTER — Other Ambulatory Visit (HOSPITAL_BASED_OUTPATIENT_CLINIC_OR_DEPARTMENT_OTHER): Payer: Medicare Other | Admitting: Lab

## 2012-01-13 VITALS — BP 134/72 | HR 69 | Temp 97.6°F

## 2012-01-13 DIAGNOSIS — Z5112 Encounter for antineoplastic immunotherapy: Secondary | ICD-10-CM

## 2012-01-13 DIAGNOSIS — D0590 Unspecified type of carcinoma in situ of unspecified breast: Secondary | ICD-10-CM

## 2012-01-13 DIAGNOSIS — Z853 Personal history of malignant neoplasm of breast: Secondary | ICD-10-CM

## 2012-01-13 DIAGNOSIS — C50919 Malignant neoplasm of unspecified site of unspecified female breast: Secondary | ICD-10-CM

## 2012-01-13 LAB — COMPREHENSIVE METABOLIC PANEL
AST: 18 U/L (ref 0–37)
Albumin: 4.2 g/dL (ref 3.5–5.2)
Alkaline Phosphatase: 76 U/L (ref 39–117)
BUN: 16 mg/dL (ref 6–23)
Creatinine, Ser: 0.67 mg/dL (ref 0.50–1.10)
Glucose, Bld: 132 mg/dL — ABNORMAL HIGH (ref 70–99)
Total Bilirubin: 0.2 mg/dL — ABNORMAL LOW (ref 0.3–1.2)

## 2012-01-13 LAB — CBC WITH DIFFERENTIAL/PLATELET
BASO%: 0.5 % (ref 0.0–2.0)
Basophils Absolute: 0 10*3/uL (ref 0.0–0.1)
EOS%: 2.2 % (ref 0.0–7.0)
HCT: 37 % (ref 34.8–46.6)
HGB: 12.8 g/dL (ref 11.6–15.9)
LYMPH%: 26.4 % (ref 14.0–49.7)
MCH: 33 pg (ref 25.1–34.0)
MCHC: 34.6 g/dL (ref 31.5–36.0)
MCV: 95.1 fL (ref 79.5–101.0)
MONO%: 8.8 % (ref 0.0–14.0)
NEUT%: 62.1 % (ref 38.4–76.8)
lymph#: 1.2 10*3/uL (ref 0.9–3.3)

## 2012-01-13 MED ORDER — ZOLEDRONIC ACID 4 MG/100ML IV SOLN
4.0000 mg | Freq: Once | INTRAVENOUS | Status: AC
  Administered 2012-01-13: 4 mg via INTRAVENOUS
  Filled 2012-01-13: qty 100

## 2012-01-13 MED ORDER — FULVESTRANT 250 MG/5ML IM SOLN
500.0000 mg | Freq: Once | INTRAMUSCULAR | Status: AC
  Administered 2012-01-13: 500 mg via INTRAMUSCULAR
  Filled 2012-01-13: qty 10

## 2012-02-03 ENCOUNTER — Other Ambulatory Visit: Payer: Medicare Other | Admitting: Lab

## 2012-02-03 ENCOUNTER — Ambulatory Visit: Payer: Medicare Other | Admitting: Oncology

## 2012-02-09 ENCOUNTER — Ambulatory Visit (HOSPITAL_BASED_OUTPATIENT_CLINIC_OR_DEPARTMENT_OTHER): Payer: Medicare Other | Admitting: Oncology

## 2012-02-09 ENCOUNTER — Other Ambulatory Visit: Payer: Medicare Other | Admitting: Lab

## 2012-02-09 VITALS — BP 129/62 | HR 69 | Temp 97.7°F | Ht 64.0 in | Wt 149.9 lb

## 2012-02-09 DIAGNOSIS — M542 Cervicalgia: Secondary | ICD-10-CM

## 2012-02-09 DIAGNOSIS — R198 Other specified symptoms and signs involving the digestive system and abdomen: Secondary | ICD-10-CM

## 2012-02-09 DIAGNOSIS — C50919 Malignant neoplasm of unspecified site of unspecified female breast: Secondary | ICD-10-CM

## 2012-02-09 DIAGNOSIS — Z853 Personal history of malignant neoplasm of breast: Secondary | ICD-10-CM

## 2012-02-09 DIAGNOSIS — D0590 Unspecified type of carcinoma in situ of unspecified breast: Secondary | ICD-10-CM

## 2012-02-09 DIAGNOSIS — M549 Dorsalgia, unspecified: Secondary | ICD-10-CM

## 2012-02-09 LAB — CBC WITH DIFFERENTIAL/PLATELET
Basophils Absolute: 0 10*3/uL (ref 0.0–0.1)
Eosinophils Absolute: 0.1 10*3/uL (ref 0.0–0.5)
HGB: 12.9 g/dL (ref 11.6–15.9)
LYMPH%: 34.8 % (ref 14.0–49.7)
MCV: 94.8 fL (ref 79.5–101.0)
MONO#: 0.4 10*3/uL (ref 0.1–0.9)
MONO%: 9.2 % (ref 0.0–14.0)
NEUT#: 2.4 10*3/uL (ref 1.5–6.5)
Platelets: 228 10*3/uL (ref 145–400)
RBC: 3.86 10*6/uL (ref 3.70–5.45)
WBC: 4.5 10*3/uL (ref 3.9–10.3)
nRBC: 0 % (ref 0–0)

## 2012-02-09 LAB — COMPREHENSIVE METABOLIC PANEL
ALT: 13 U/L (ref 0–35)
CO2: 25 mEq/L (ref 19–32)
Calcium: 9.5 mg/dL (ref 8.4–10.5)
Chloride: 105 mEq/L (ref 96–112)
Creatinine, Ser: 0.59 mg/dL (ref 0.50–1.10)
Glucose, Bld: 96 mg/dL (ref 70–99)
Sodium: 140 mEq/L (ref 135–145)
Total Protein: 7.2 g/dL (ref 6.0–8.3)

## 2012-02-09 NOTE — Progress Notes (Signed)
ID: Jamie [name]   DOB: 12/19/1935  MR#: [id number]  JYN#:[id number]  HISTORY OF PRESENT ILLNESS: The patient's cancer was uncovered by an unusually circuitous route. She presented with stomach upset.  Because of a history of colitis, Dr. Dorena Cookey who happened to be on call for Dr. Matthias Hughs, obtained CT of the abdomen and pelvis on August 30, 2005.  As far as the abdomen and pelvis were concerned, there was no evidence of colitis but there were multiple scattered sclerotic lesions in the bone suggesting possible sclerotic metastatic disease (versus osteopoikilosis.)  Accordingly a bone scan was obtained.  This was done on September 01, 2005, and found tiny sclerotic lesions, and in particular a lesion of concern in the sternum.  Accordingly a CT scan of the chest was obtained.  This found the sternal problem to correspond with degenerative changes. However, it also showed a soft tissue nodule in the left breast.  The patient then had mammograms on September 27, 2005.  This found two areas of spiculation and architectural distortion in the upper outer quadrant of the left breast.  By ultrasound these were hyperechoic and irregular, highly suspicious for carcinoma.  Biopsy was obtained the same day and showed ([id number] and [id number], as well as [phone]) invasive lobular carcinoma, with both lesions biopsied being ER and PR positive, both herceptest positive, both negative by FISH.  Accordingly the patient was referred to Dr. Corliss Skains and breast MRI was obtained October 06, 2006.  No abnormal enhancement was noted in the right breast.  There was enhancing nodularity in the upper portion of the left breast where several discrete nodules were seen.  Accordingly Dr. Corliss Skains performed a left modified radical mastectomy on November 02, 2005.  The patient had a positive sentinel lymph node and Dr. Corliss Skains performed a left axillary lymph node dissection.  However, only three additional lymph nodes were recovered, two of which  also showed metastatic involvement (all had extra capsular extension.)  The patient's subsequent history is as detailed below  INTERVAL HISTORY: The interval history is generally unchanged. [place] except it's very hard for her to get her church, nearly 40 miles one way. Her diabetes is being handled through Dr. Lucianne Muss, and she tells me it's under good control. She also saw Dr. Titus Dubin recently.  REVIEW OF SYSTEMS: The biggest problems she is having is slow bowel movements. They're not exactly heart, they just "don't want to come out". When she does have one, is very voluminous. She doesn't have any hemorrhoids, bleeding, or melena. A second issue is stiffness, particularly when she has been sitting for a long time. This improves with activity. She has chronic back and neck pain, which has not more intense or constant than previously. She feels anxious and depressed and there are significant marital issues which she discussed today otherwise a detailed review of systems was stable.  PAST MEDICAL HISTORY: Past Medical History  Diagnosis Date  . Allergy     seasonal  . Anxiety   . Arthritis   . Asthma   . Cancer     breast   . Depression   . Diabetes mellitus   . GERD (gastroesophageal reflux disease)   . Heart murmur   . Hyperlipidemia   Significant for diabetes, hypercholesterolemia, asthma, mitral valve prolapse requiring antibiotic prophylaxis before any invasive procedures, Meniere's disease, gastroesophageal reflux disease, osteoarthritis, degenerative disk disease, history of fibromyalgia, panic attacks, history of synovial cyst removal, history  of total hysterectomy with bilateral salpingo-oophorectomy, history of septoplasty x2, status post appendectomy, status post dilatation and curettage remotely, status post bilateral carpal tunnel release under Dr. Rosanne Ashing [place].  PAST SURGICAL HISTORY: Past Surgical History  Procedure Date  .  Mastectomy     left  and then treated with chemo and radiation  . Abdominal hysterectomy     total  . Back surgery     synovial cyst removed from spine  . Carpal tunnel release     bilateral  . Colonoscopy   . Polypectomy   . Upper gastrointestinal endoscopy   . Appendectomy     FAMILY HISTORY The patient's father died at the age of 25 from a stroke.  The patient's mother died at the age of 53, also from a stroke.  The patient has three brothers; one died with "liver disease."  One has prostate cancer.  The other brother has prostate and colon cancer.  The only breast cancer in the family was the patient's mother's mother and she does not know how old her grandmother was when she was diagnosed.  There is no history of ovarian cancer in the family.  GYNECOLOGIC HISTORY: She is GXP2.  She had her hysterectomy while still menstruating and she took hormone replacement until December 2006, when she had her breast biopsy.  SOCIAL HISTORY: She used to work as a Lawyer.  Her husband Jamie [name] (who is the son of my former patient Hessie Rothermel) is semiretired, working for a Financial risk analyst.  What he likes to do is hunt and fish.  Their son Jamie [name] had significant muscular dystrophy and died in 03/23/07. Their daughter Jamie [name], 34 years old, lives in [place] and works in an office.  The patient has two granddaughters, both RNs, one working at [employer] and the other in [place].    ADVANCED DIRECTIVES:  HEALTH MAINTENANCE: History  Substance Use Topics  . Smoking status: Never Smoker   . Smokeless tobacco: Never Used  . Alcohol Use: No     Colonoscopy:  PAP:  Bone density:  Lipid panel:  Allergies  Allergen Reactions  . Niacin   . Promethazine Hcl     Current Outpatient Prescriptions  Medication Sig Dispense Refill  . albuterol (PROVENTIL) (2.5 MG/3ML) 0.083% nebulizer solution Take 2.5 mg by nebulization every 6 (six) hours as needed.      Marland Kitchen BYSTOLIC 5 MG tablet Take 1 tablet by mouth  Daily.      . clonazePAM (KLONOPIN) 0.5 MG tablet Take 0.5 mg by mouth 2 (two) times daily.        . CRESTOR 5 MG tablet Take 1 tablet by mouth Daily.      Marland Kitchen dicyclomine (BENTYL) 10 MG capsule Take 10 mg by mouth 2 times daily at 12 noon and 4 pm.        . glimepiride (AMARYL) 1 MG tablet Take 0.5 tablets by mouth Daily.       Marland Kitchen HYDROCHLOROTHIAZIDE PO Take 75 mg by mouth as needed. swelling       . meclizine (ANTIVERT) 25 MG tablet Take 1 tablet by mouth as needed. dizziness      . metFORMIN (GLUCOPHAGE) 500 MG tablet Take 1 tablet by mouth Daily.      . mometasone-formoterol (DULERA) 100-5 MCG/ACT AERO Inhale 2 puffs into the lungs 2 (two) times daily.      Marland Kitchen NASONEX 50 MCG/ACT nasal spray 1 spray by Nasal route at bedtime.      Marland Kitchen NEXIUM  40 MG capsule Take 1 tablet by mouth Daily.      . sucralfate (CARAFATE) 1 G tablet Take 1 g by mouth as needed. Abdominal discomfort         OBJECTIVE: Middle-aged white woman in no acute distress Filed Vitals:   02/09/12 1134  BP: 129/62  Pulse: 69  Temp: 97.7 F (36.5 C)     Body mass index is 25.73 kg/(m^2).    ECOG FS: 2  Sclerae unicteric Oropharynx clear No peripheral adenopathy Lungs no rales or rhonchi Heart regular rate and rhythm Abd benign MSK no focal spinal tenderness, no peripheral edema Neuro: nonfocal Breasts: the right breast is unremarkable; the left breast is status post mastectomy. There is no evidence of local recurrence  LAB RESULTS: Lab Results  Component Value Date   WBC 4.5 02/09/2012   NEUTROABS 2.4 02/09/2012   HGB 12.9 02/09/2012   HCT 36.6 02/09/2012   MCV 94.8 02/09/2012   PLT 228 02/09/2012      Chemistry      Component Value Date/Time   NA 138 01/13/2012 0932   K 3.9 01/13/2012 0932   CL 101 01/13/2012 0932   CO2 29 01/13/2012 0932   BUN 16 01/13/2012 0932   CREATININE 0.67 01/13/2012 0932      Component Value Date/Time   CALCIUM 9.9 01/13/2012 0932   ALKPHOS 76 01/13/2012 0932   AST 18 01/13/2012 0932    ALT 12 01/13/2012 0932   BILITOT 0.2* 01/13/2012 0932       Lab Results  Component Value Date   LABCA2 49* 01/13/2012    No components found with this basename: [id number]    No results found for this basename: INR:1;PROTIME:1 in the last 168 hours  Urinalysis    Component Value Date/Time   COLORURINE YELLOW 07/11/2011 1211   APPEARANCEUR CLEAR 07/11/2011 1211   LABSPEC 1.007 07/11/2011 1211   PHURINE 7.0 07/11/2011 1211   GLUCOSEU NEGATIVE 07/11/2011 1211   HGBUR NEGATIVE 07/11/2011 1211   BILIRUBINUR NEGATIVE 07/11/2011 1211   KETONESUR NEGATIVE 07/11/2011 1211   PROTEINUR NEGATIVE 07/11/2011 1211   UROBILINOGEN 0.2 07/11/2011 1211   NITRITE NEGATIVE 07/11/2011 1211   LEUKOCYTESUR SMALL* 07/11/2011 1211    STUDIES: Repeat mammography will be due July 2013  ASSESSMENT:76 year old [place] woman    (1) status post left modified radical mastectomy in January 2007 for a T1c N1, stage IIA  invasive lobular carcinoma, grade 1, strongly estrogen and progesterone receptor positive, HER-2 negative, with an MIB-1 of 7%.  (2)  Status post radiation given concurrently with Capecitabine.  Declined chemotherapy.    (3) Status post anastrozole and exemestane, both discontinued due to aches and pains.  Status post tamoxifen between October 2007 until August 2011.  Discontinued secondary to cramps   (4) multiple sclerotic bony lesions noted at initial diagnosis, biopsied x2 August 2009 without malignancy documented, on zoledronic acid annually starting 12/12/2007, last given in March 2013.  (5) On fulvestrant since September 2011, with good tolerance  PLAN: from a breast cancer point of view she is doing very well. We will continue the fulvestrant every 28 days. The zoledronic acid will be given March of 2014. We will suggest that she take TUMS several times a day around the zoledronic acid dose, which should improve her symptoms.  I am not sure what the cause is of her change in bowel habits.  Perhaps it is due to her diabetes. In that case metoclopramide might be useful. In any case  it won't hurt if she uses a stool softener and takes MiraLAX on a regular basis.  She will see [place] again in 6 months. She knows to call before then if any problems arise.  Trimaine Maser C    02/09/2012

## 2012-02-10 ENCOUNTER — Ambulatory Visit (HOSPITAL_BASED_OUTPATIENT_CLINIC_OR_DEPARTMENT_OTHER): Payer: Medicare Other

## 2012-02-10 VITALS — BP 122/65 | HR 70 | Temp 97.4°F

## 2012-02-10 DIAGNOSIS — Z853 Personal history of malignant neoplasm of breast: Secondary | ICD-10-CM

## 2012-02-10 DIAGNOSIS — C50919 Malignant neoplasm of unspecified site of unspecified female breast: Secondary | ICD-10-CM

## 2012-02-10 DIAGNOSIS — Z5111 Encounter for antineoplastic chemotherapy: Secondary | ICD-10-CM

## 2012-02-10 MED ORDER — FULVESTRANT 250 MG/5ML IM SOLN
500.0000 mg | Freq: Once | INTRAMUSCULAR | Status: AC
  Administered 2012-02-10: 500 mg via INTRAMUSCULAR
  Filled 2012-02-10: qty 10

## 2012-02-14 ENCOUNTER — Telehealth: Payer: Self-pay | Admitting: Oncology

## 2012-02-14 NOTE — Telephone Encounter (Signed)
Pt's son called to cancel his mother's appts for 02/15/2012 due to she has devolped an infection in her breast and they s/w the surgeon and he is aware. Will make the md awate also of the problem

## 2012-03-02 ENCOUNTER — Other Ambulatory Visit: Payer: Medicare Other | Admitting: Lab

## 2012-03-02 ENCOUNTER — Ambulatory Visit: Payer: Medicare Other

## 2012-03-03 ENCOUNTER — Telehealth: Payer: Self-pay | Admitting: Oncology

## 2012-03-03 NOTE — Telephone Encounter (Signed)
Pt came into the office to get her schedules for may-oct 2013 appts

## 2012-03-07 ENCOUNTER — Encounter: Payer: Self-pay | Admitting: Internal Medicine

## 2012-03-08 ENCOUNTER — Other Ambulatory Visit (HOSPITAL_BASED_OUTPATIENT_CLINIC_OR_DEPARTMENT_OTHER): Payer: Medicare Other | Admitting: Lab

## 2012-03-08 ENCOUNTER — Other Ambulatory Visit: Payer: Self-pay | Admitting: Oncology

## 2012-03-08 ENCOUNTER — Ambulatory Visit (HOSPITAL_BASED_OUTPATIENT_CLINIC_OR_DEPARTMENT_OTHER): Payer: Medicare Other

## 2012-03-08 ENCOUNTER — Telehealth (INDEPENDENT_AMBULATORY_CARE_PROVIDER_SITE_OTHER): Payer: Self-pay

## 2012-03-08 VITALS — BP 135/68 | HR 78 | Temp 98.2°F

## 2012-03-08 DIAGNOSIS — Z5111 Encounter for antineoplastic chemotherapy: Secondary | ICD-10-CM

## 2012-03-08 DIAGNOSIS — C50219 Malignant neoplasm of upper-inner quadrant of unspecified female breast: Secondary | ICD-10-CM

## 2012-03-08 DIAGNOSIS — C50919 Malignant neoplasm of unspecified site of unspecified female breast: Secondary | ICD-10-CM

## 2012-03-08 DIAGNOSIS — Z853 Personal history of malignant neoplasm of breast: Secondary | ICD-10-CM

## 2012-03-08 LAB — CBC WITH DIFFERENTIAL/PLATELET
Basophils Absolute: 0 10*3/uL (ref 0.0–0.1)
Eosinophils Absolute: 0.1 10*3/uL (ref 0.0–0.5)
HCT: 36.8 % (ref 34.8–46.6)
HGB: 12.7 g/dL (ref 11.6–15.9)
LYMPH%: 30 % (ref 14.0–49.7)
MCV: 94.6 fL (ref 79.5–101.0)
MONO#: 0.5 10*3/uL (ref 0.1–0.9)
MONO%: 11 % (ref 0.0–14.0)
NEUT#: 2.7 10*3/uL (ref 1.5–6.5)
Platelets: 223 10*3/uL (ref 145–400)
WBC: 4.8 10*3/uL (ref 3.9–10.3)

## 2012-03-08 LAB — COMPREHENSIVE METABOLIC PANEL
ALT: 10 U/L (ref 0–35)
CO2: 25 mEq/L (ref 19–32)
Calcium: 9.6 mg/dL (ref 8.4–10.5)
Chloride: 105 mEq/L (ref 96–112)
Creatinine, Ser: 0.74 mg/dL (ref 0.50–1.10)
Glucose, Bld: 74 mg/dL (ref 70–99)
Total Bilirubin: 0.3 mg/dL (ref 0.3–1.2)
Total Protein: 6.6 g/dL (ref 6.0–8.3)

## 2012-03-08 MED ORDER — FULVESTRANT 250 MG/5ML IM SOLN
500.0000 mg | Freq: Once | INTRAMUSCULAR | Status: AC
  Administered 2012-03-08: 500 mg via INTRAMUSCULAR
  Filled 2012-03-08: qty 10

## 2012-03-08 NOTE — Patient Instructions (Signed)
Call MD for problems 

## 2012-03-08 NOTE — Telephone Encounter (Signed)
Message copied by Ivory Broad on Wed Mar 08, 2012  2:41 PM ------      Message from: Cathi Roan      Created: Wed Mar 08, 2012  2:08 PM      Regarding: Pain under arm      Contact: [phone]       Patient of Dr. Corliss Skains, left masty, now lots of pain under arm wants to know if she needs to come in.

## 2012-03-08 NOTE — Telephone Encounter (Signed)
I called the patient and she is having sharp pain in her left axilla for a week and a half.  It seems to be getting worse.  She is not sure if she pulled something.  She has no swelling, redness or fever.  When she hold her arm down to her side she is ok but it hurts when she moves it.  I gave her an appointment for 5/29 but will talk to [facility] tomorrow to see if they can work her in sooner with Dr Corliss Skains to have him check it.

## 2012-03-10 ENCOUNTER — Encounter (INDEPENDENT_AMBULATORY_CARE_PROVIDER_SITE_OTHER): Payer: Self-pay | Admitting: Surgery

## 2012-03-15 ENCOUNTER — Ambulatory Visit (INDEPENDENT_AMBULATORY_CARE_PROVIDER_SITE_OTHER): Payer: Self-pay | Admitting: Surgery

## 2012-03-17 ENCOUNTER — Encounter: Payer: Self-pay | Admitting: Internal Medicine

## 2012-04-12 ENCOUNTER — Other Ambulatory Visit (HOSPITAL_BASED_OUTPATIENT_CLINIC_OR_DEPARTMENT_OTHER): Payer: Medicare Other | Admitting: Lab

## 2012-04-12 ENCOUNTER — Other Ambulatory Visit: Payer: Self-pay | Admitting: Oncology

## 2012-04-12 ENCOUNTER — Ambulatory Visit (HOSPITAL_BASED_OUTPATIENT_CLINIC_OR_DEPARTMENT_OTHER): Payer: Medicare Other

## 2012-04-12 VITALS — BP 132/73 | HR 82 | Temp 97.1°F

## 2012-04-12 DIAGNOSIS — Z853 Personal history of malignant neoplasm of breast: Secondary | ICD-10-CM

## 2012-04-12 DIAGNOSIS — Z5111 Encounter for antineoplastic chemotherapy: Secondary | ICD-10-CM

## 2012-04-12 DIAGNOSIS — C50219 Malignant neoplasm of upper-inner quadrant of unspecified female breast: Secondary | ICD-10-CM

## 2012-04-12 LAB — CBC WITH DIFFERENTIAL/PLATELET
BASO%: 0.6 % (ref 0.0–2.0)
Eosinophils Absolute: 0.1 10*3/uL (ref 0.0–0.5)
HCT: 37.6 % (ref 34.8–46.6)
LYMPH%: 23.8 % (ref 14.0–49.7)
MCV: 95.5 fL (ref 79.5–101.0)
MONO#: 0.4 10*3/uL (ref 0.1–0.9)
NEUT#: 3.1 10*3/uL (ref 1.5–6.5)
Platelets: 243 10*3/uL (ref 145–400)
RBC: 3.94 10*6/uL (ref 3.70–5.45)
WBC: 4.6 10*3/uL (ref 3.9–10.3)

## 2012-04-12 LAB — COMPREHENSIVE METABOLIC PANEL
Albumin: 4.3 g/dL (ref 3.5–5.2)
CO2: 26 mEq/L (ref 19–32)
Calcium: 9.3 mg/dL (ref 8.4–10.5)
Chloride: 104 mEq/L (ref 96–112)
Glucose, Bld: 171 mg/dL — ABNORMAL HIGH (ref 70–99)
Sodium: 141 mEq/L (ref 135–145)
Total Bilirubin: 0.4 mg/dL (ref 0.3–1.2)
Total Protein: 6.6 g/dL (ref 6.0–8.3)

## 2012-04-12 LAB — CANCER ANTIGEN 27.29: CA 27.29: 52 U/mL — ABNORMAL HIGH (ref 0–39)

## 2012-04-12 MED ORDER — FULVESTRANT 250 MG/5ML IM SOLN
500.0000 mg | Freq: Once | INTRAMUSCULAR | Status: AC
  Administered 2012-04-12: 500 mg via INTRAMUSCULAR
  Filled 2012-04-12: qty 10

## 2012-04-20 ENCOUNTER — Telehealth: Payer: Self-pay | Admitting: Internal Medicine

## 2012-04-20 NOTE — Telephone Encounter (Signed)
Patient sees Dr. [name]. Has a history of IBS. Has had prior colonoscopy. Problems with constipation. Called yesterday and told to take magnesium citrate. Took this 2 hours ago. Called because she was concerned it was not working. Complains of bloating. No nausea vomiting or pain. Interestingly, she is sitting on the toilet and beginning to move her bowels during this call. I advised that she get on a regular dose of MiraLax, and titrate to need. She has a pre-visit for her upcoming colonoscopy in a few weeks. She can contact the office next week if this bowel regimen is ineffective.

## 2012-04-21 ENCOUNTER — Encounter (HOSPITAL_COMMUNITY): Payer: Self-pay | Admitting: *Deleted

## 2012-04-21 ENCOUNTER — Emergency Department (HOSPITAL_COMMUNITY): Payer: Medicare Other

## 2012-04-21 ENCOUNTER — Emergency Department (HOSPITAL_COMMUNITY)
Admission: EM | Admit: 2012-04-21 | Discharge: 2012-04-21 | Disposition: A | Discharge status: Home / Self Care | Payer: Medicare Other | Attending: Emergency Medicine | Admitting: Emergency Medicine

## 2012-04-21 DIAGNOSIS — K589 Irritable bowel syndrome without diarrhea: Secondary | ICD-10-CM | POA: Insufficient documentation

## 2012-04-21 DIAGNOSIS — F3289 Other specified depressive episodes: Secondary | ICD-10-CM | POA: Insufficient documentation

## 2012-04-21 DIAGNOSIS — C7951 Secondary malignant neoplasm of bone: Secondary | ICD-10-CM | POA: Insufficient documentation

## 2012-04-21 DIAGNOSIS — E119 Type 2 diabetes mellitus without complications: Secondary | ICD-10-CM | POA: Insufficient documentation

## 2012-04-21 DIAGNOSIS — F329 Major depressive disorder, single episode, unspecified: Secondary | ICD-10-CM | POA: Insufficient documentation

## 2012-04-21 DIAGNOSIS — C7952 Secondary malignant neoplasm of bone marrow: Secondary | ICD-10-CM | POA: Insufficient documentation

## 2012-04-21 DIAGNOSIS — K219 Gastro-esophageal reflux disease without esophagitis: Secondary | ICD-10-CM | POA: Insufficient documentation

## 2012-04-21 DIAGNOSIS — C50919 Malignant neoplasm of unspecified site of unspecified female breast: Secondary | ICD-10-CM | POA: Insufficient documentation

## 2012-04-21 DIAGNOSIS — R197 Diarrhea, unspecified: Secondary | ICD-10-CM

## 2012-04-21 DIAGNOSIS — R1013 Epigastric pain: Secondary | ICD-10-CM | POA: Insufficient documentation

## 2012-04-21 DIAGNOSIS — E785 Hyperlipidemia, unspecified: Secondary | ICD-10-CM | POA: Insufficient documentation

## 2012-04-21 DIAGNOSIS — J45909 Unspecified asthma, uncomplicated: Secondary | ICD-10-CM | POA: Insufficient documentation

## 2012-04-21 LAB — URINALYSIS, ROUTINE W REFLEX MICROSCOPIC
Bilirubin Urine: NEGATIVE
Hgb urine dipstick: NEGATIVE
Ketones, ur: NEGATIVE mg/dL
Specific Gravity, Urine: 1.013 (ref 1.005–1.030)
Urobilinogen, UA: 0.2 mg/dL (ref 0.0–1.0)
pH: 7.5 (ref 5.0–8.0)

## 2012-04-21 LAB — CBC WITH DIFFERENTIAL/PLATELET
Basophils Relative: 1 % (ref 0–1)
Eosinophils Relative: 3 % (ref 0–5)
HCT: 37.3 % (ref 36.0–46.0)
Hemoglobin: 12.6 g/dL (ref 12.0–15.0)
MCHC: 33.8 g/dL (ref 30.0–36.0)
MCV: 95.2 fL (ref 78.0–100.0)
Monocytes Absolute: 0.6 10*3/uL (ref 0.1–1.0)
Monocytes Relative: 9 % (ref 3–12)
Neutro Abs: 4.4 10*3/uL (ref 1.7–7.7)
RDW: 12.4 % (ref 11.5–15.5)

## 2012-04-21 LAB — COMPREHENSIVE METABOLIC PANEL
ALT: 12 U/L (ref 0–35)
AST: 18 U/L (ref 0–37)
Albumin: 3.8 g/dL (ref 3.5–5.2)
Alkaline Phosphatase: 76 U/L (ref 39–117)
BUN: 12 mg/dL (ref 6–23)
Chloride: 101 mEq/L (ref 96–112)
Potassium: 3.8 mEq/L (ref 3.5–5.1)
Sodium: 140 mEq/L (ref 135–145)
Total Bilirubin: 0.2 mg/dL — ABNORMAL LOW (ref 0.3–1.2)
Total Protein: 6.9 g/dL (ref 6.0–8.3)

## 2012-04-21 LAB — URINE MICROSCOPIC-ADD ON

## 2012-04-21 LAB — LIPASE, BLOOD: Lipase: 28 U/L (ref 11–59)

## 2012-04-21 MED ORDER — DICYCLOMINE HCL 10 MG PO CAPS
10.0000 mg | ORAL_CAPSULE | Freq: Once | ORAL | Status: DC
  Filled 2012-04-21: qty 1

## 2012-04-21 NOTE — ED Notes (Signed)
Pt c/o diarrhea; abd pain; nausea.  Problems with constipation for several months--took magnesium citrate yesterday at 3pm and started using bathroom and hasn't been able to stop

## 2012-04-21 NOTE — ED Provider Notes (Signed)
History     CSN: [id number]  Arrival date & time 04/21/12  4098   First MD Initiated Contact with Patient 04/21/12 [phone]      Chief Complaint  Patient presents with  . Abdominal Pain    (Consider location/radiation/quality/duration/timing/severity/associated sxs/prior treatment) HPI 76 year old female presents emergency apartment with complaint of diffuse abdominal pain/cramping and diarrhea after taking a bottle of magnesium citrate. Patient reports she's had problems with constipation ongoing for several months. Patient has history of irritable bowel syndrome followed by Dr. Dickie La with GI. She reports she has tried several different over-the-counter remedies for constipation which did not work. Patient called Dr. Marian Sorrow office and was advised to take a bottle of mag citrate. Patient reports she took it at 3:00. Around 6:00 she began to have multiple episodes of loose stools. Past Medical History  Diagnosis Date  . Allergy     seasonal  . Anxiety   . Arthritis   . Asthma   . Cancer     breast   . Depression   . Diabetes mellitus   . GERD (gastroesophageal reflux disease)   . Heart murmur   . Hyperlipidemia     Past Surgical History  Procedure Date  . Mastectomy     left  and then treated with chemo and radiation  . Abdominal hysterectomy     total  . Back surgery     synovial cyst removed from spine  . Carpal tunnel release     bilateral  . Colonoscopy   . Polypectomy   . Upper gastrointestinal endoscopy   . Appendectomy   . Breast surgery 11/02/2005    left breast cancer  . Abdominal hysterectomy     No family history on file.  History  Substance Use Topics  . Smoking status: Never Smoker   . Smokeless tobacco: Never Used  . Alcohol Use: No    OB History    Grav Para Term Preterm Abortions TAB SAB Ect Mult Living                  Review of Systems  All other systems reviewed and are negative.    Allergies  Niacin and Promethazine hcl  Home  Medications   Current Outpatient Rx  Name Route Sig Dispense Refill  . BYSTOLIC 5 MG PO TABS Oral Take 1 tablet by mouth Daily.    Marland Kitchen CLONAZEPAM 0.5 MG PO TABS Oral Take 0.5 mg by mouth 2 (two) times daily.      . CRESTOR 5 MG PO TABS Oral Take 1 tablet by mouth Daily.    Marland Kitchen DICYCLOMINE HCL 10 MG PO CAPS Oral Take 10 mg by mouth 2 times daily at 12 noon and 4 pm.      . FULVESTRANT 250 MG/5ML IM SOLN Intramuscular Inject 250 mg into the muscle every 30 (thirty) days. One injection each buttock over 1-2 minutes. Warm prior to use.    Marland Kitchen GLIMEPIRIDE 1 MG PO TABS Oral Take 0.5 tablets by mouth Daily.     Marland Kitchen HYDROCHLOROTHIAZIDE PO Oral Take 75 mg by mouth as needed. swelling     . METFORMIN HCL 500 MG PO TABS Oral Take 1 tablet by mouth Daily.    . MOMETASONE FURO-FORMOTEROL FUM 100-5 MCG/ACT IN AERO Inhalation Inhale 2 puffs into the lungs 2 (two) times daily.    Marland Kitchen NASONEX 50 MCG/ACT NA SUSP Nasal 1 spray by Nasal route at bedtime.    Marland Kitchen NEXIUM 40 MG PO CPDR  Oral Take 1 tablet by mouth Daily.    . ONGLYZA 5 MG PO TABS Oral Take 5 mg by mouth daily.     Marland Kitchen RANITIDINE HCL 300 MG PO TABS Oral Take 300 mg by mouth daily.     . SUCRALFATE 1 G PO TABS Oral Take 1 g by mouth as needed. Abdominal discomfort     . ZOLEDRONIC ACID 4 MG/5ML IV CONC Intravenous Inject 4 mg into the vein once. Once a year    . MECLIZINE HCL 25 MG PO TABS Oral Take 1 tablet by mouth as needed. dizziness      BP 146/58  Pulse 83  Temp 98.2 F (36.8 C) (Oral)  Resp 18  SpO2 99%  Physical Exam  Nursing note and vitals reviewed. Constitutional: She is oriented to person, place, and time. She appears well-developed and well-nourished.  HENT:  Head: Normocephalic and atraumatic.  Nose: Nose normal.  Mouth/Throat: Oropharynx is clear and moist.  Eyes: Conjunctivae and EOM are normal. Pupils are equal, round, and reactive to light.  Neck: Normal range of motion. Neck supple. No JVD present. No tracheal deviation present. No  thyromegaly present.  Cardiovascular: Normal rate, regular rhythm, normal heart sounds and intact distal pulses.  Exam reveals no gallop and no friction rub.   No murmur heard. Pulmonary/Chest: Effort normal and breath sounds normal. No stridor. No respiratory distress. She has no wheezes. She has no rales. She exhibits no tenderness.  Abdominal: Soft. Bowel sounds are normal. She exhibits no distension and no mass. There is Tenderness: mild epigastric tenderness without rebound or guarding.. There is no rebound and no guarding.  Musculoskeletal: Normal range of motion. She exhibits no edema and no tenderness.  Lymphadenopathy:    She has no cervical adenopathy.  Neurological: She is oriented to person, place, and time. She exhibits normal muscle tone. Coordination normal.  Skin: Skin is dry. No rash noted. No erythema. No pallor.  Psychiatric: She has a normal mood and affect. Her behavior is normal. Judgment and thought content normal.    ED Course  Procedures (including critical care time)  Labs Reviewed  COMPREHENSIVE METABOLIC PANEL - Abnormal; Notable for the following:    Glucose, Bld 135 (*)     Total Bilirubin 0.2 (*)     GFR calc non Af Amer 85 (*)     All other components within normal limits  URINALYSIS, ROUTINE W REFLEX MICROSCOPIC - Abnormal; Notable for the following:    Leukocytes, UA MODERATE (*)     All other components within normal limits  URINE MICROSCOPIC-ADD ON - Abnormal; Notable for the following:    Bacteria, UA MANY (*)     All other components within normal limits  LIPASE, BLOOD  CBC WITH DIFFERENTIAL   Dg Abd Acute W/chest  04/21/2012  *RADIOLOGY REPORT*  Clinical Data: Abdominal pain, diarrhea.  ACUTE ABDOMEN SERIES (ABDOMEN 2 VIEW & CHEST 1 VIEW)  Comparison: 07/11/2011 abdominal radiograph, 10/16/2011 chest radiograph  Findings: Mild apical scarring.  Cardiomediastinal contours within normal range.  Surgical clips left axilla.  No free intraperitoneal  air. The bowel gas pattern is non- obstructive. Organ outlines are normal where seen. Leftward curvature of the lumbar spine.  Multiple sclerotic foci scattered throughout the bones, in keeping with metastatic disease.  IMPRESSION: Nonobstructive bowel gas pattern.  Sclerotic metastases again noted.  Original Report Authenticated By: Waneta Martins, [name].     1. Diarrhea       MDM  76 year old  female with constipation now with diarrhea after taking magnesium citrate. Abdomen is soft only mildly tender without rebound or guarding. Lab work is unremarkable. Patient has good followup with her gastroenterologist. Patient has history of breast cancer with bony metastasis, but no prior bowel involvement. Patient is due for colonoscopy which she plans to discuss with her gastroenterologist. Patient is feeling somewhat better after taking Bentyl and wishes to go home.       Olivia Mackie, MD 04/21/12 [phone]

## 2012-04-21 NOTE — ED Notes (Signed)
AVW:[id number]<XB> Expected date:<BR> Expected time:<BR> Means of arrival:<BR> Comments:<BR> Closed

## 2012-05-03 ENCOUNTER — Ambulatory Visit (AMBULATORY_SURGERY_CENTER): Payer: Medicare Other

## 2012-05-03 VITALS — Ht 64.0 in | Wt 147.7 lb

## 2012-05-03 DIAGNOSIS — Z860100 Personal history of colon polyps, unspecified: Secondary | ICD-10-CM

## 2012-05-03 MED ORDER — MOVIPREP 100 G PO SOLR: 1.0000 | Freq: Once | ORAL | Status: DC

## 2012-05-04 ENCOUNTER — Encounter: Payer: Self-pay | Admitting: Internal Medicine

## 2012-05-10 ENCOUNTER — Ambulatory Visit: Payer: Medicare Other

## 2012-05-10 ENCOUNTER — Other Ambulatory Visit: Payer: Medicare Other

## 2012-05-11 ENCOUNTER — Ambulatory Visit (HOSPITAL_BASED_OUTPATIENT_CLINIC_OR_DEPARTMENT_OTHER): Payer: Medicare Other

## 2012-05-11 ENCOUNTER — Other Ambulatory Visit (HOSPITAL_BASED_OUTPATIENT_CLINIC_OR_DEPARTMENT_OTHER): Payer: Medicare Other | Admitting: Lab

## 2012-05-11 VITALS — BP 137/67 | HR 66 | Temp 96.7°F

## 2012-05-11 DIAGNOSIS — C50219 Malignant neoplasm of upper-inner quadrant of unspecified female breast: Secondary | ICD-10-CM

## 2012-05-11 DIAGNOSIS — Z5111 Encounter for antineoplastic chemotherapy: Secondary | ICD-10-CM

## 2012-05-11 DIAGNOSIS — R197 Diarrhea, unspecified: Secondary | ICD-10-CM

## 2012-05-11 DIAGNOSIS — C50919 Malignant neoplasm of unspecified site of unspecified female breast: Secondary | ICD-10-CM

## 2012-05-11 DIAGNOSIS — Z853 Personal history of malignant neoplasm of breast: Secondary | ICD-10-CM

## 2012-05-11 LAB — COMPREHENSIVE METABOLIC PANEL
ALT: 12 U/L (ref 0–35)
Albumin: 4.5 g/dL (ref 3.5–5.2)
CO2: 27 mEq/L (ref 19–32)
Calcium: 9.5 mg/dL (ref 8.4–10.5)
Chloride: 105 mEq/L (ref 96–112)
Potassium: 4 mEq/L (ref 3.5–5.3)
Sodium: 140 mEq/L (ref 135–145)
Total Protein: 6.8 g/dL (ref 6.0–8.3)

## 2012-05-11 LAB — CBC WITH DIFFERENTIAL/PLATELET
BASO%: 0.6 % (ref 0.0–2.0)
MCHC: 34 g/dL (ref 31.5–36.0)
MONO#: 0.4 10*3/uL (ref 0.1–0.9)
NEUT#: 3.4 10*3/uL (ref 1.5–6.5)
RBC: 3.81 10*6/uL (ref 3.70–5.45)
WBC: 5.3 10*3/uL (ref 3.9–10.3)
lymph#: 1.3 10*3/uL (ref 0.9–3.3)

## 2012-05-11 LAB — CANCER ANTIGEN 27.29: CA 27.29: 52 U/mL — ABNORMAL HIGH (ref 0–39)

## 2012-05-11 MED ORDER — FULVESTRANT 250 MG/5ML IM SOLN
500.0000 mg | Freq: Once | INTRAMUSCULAR | Status: AC
  Administered 2012-05-11: 500 mg via INTRAMUSCULAR
  Filled 2012-05-11: qty 10

## 2012-05-17 ENCOUNTER — Encounter: Payer: Self-pay | Admitting: *Deleted

## 2012-05-17 ENCOUNTER — Encounter: Payer: Self-pay | Admitting: Internal Medicine

## 2012-05-17 ENCOUNTER — Ambulatory Visit (AMBULATORY_SURGERY_CENTER): Payer: Medicare Other | Admitting: Internal Medicine

## 2012-05-17 VITALS — BP 147/76 | HR 82 | Temp 97.3°F | Resp 19 | Ht 64.0 in | Wt 147.0 lb

## 2012-05-17 DIAGNOSIS — D126 Benign neoplasm of colon, unspecified: Secondary | ICD-10-CM

## 2012-05-17 DIAGNOSIS — Z86010 Personal history of colonic polyps: Secondary | ICD-10-CM

## 2012-05-17 MED ORDER — SODIUM CHLORIDE 0.9 % IV SOLN: 500.0000 mL | INTRAVENOUS | Status: DC

## 2012-05-17 NOTE — Op Note (Signed)
[facility] [address] [place], [place]  [address]  COLONOSCOPY PROCEDURE REPORT  PATIENT:  Jamie [name], Jamie [name]  MR#:  [id number] BIRTHDATE:  1936/01/26, 75 yrs. old  GENDER:  female ENDOSCOPIST:  Hedwig Morton. [name], MD REF. BY:  [name], [name]. Ruthann Cancer, [name]. PROCEDURE DATE:  05/17/2012 PROCEDURE:  Colon with cold biopsy polypectomy ASA CLASS:  Class II INDICATIONS:  history of polyps, constipation MEDICATIONS:   MAC sedation, administered by CRNA, propofol (Diprivan) 80 mg  DESCRIPTION OF PROCEDURE:   After the risks and benefits and of the procedure were explained, informed consent was obtained. Digital rectal exam was performed and revealed no rectal masses. The LB CF-[id number] endoscope was introduced through the anus and advanced to the cecum, which was identified by both the appendix and ileocecal valve.  The quality of the prep was good, using MoviPrep.  The instrument was then slowly withdrawn as the colon was fully examined. <<PROCEDUREIMAGES>>  FINDINGS:  A diminutive polyp was found. 3 mm polyp at 60 cm The polyp was removed using cold biopsy forceps.  Internal Hemorrhoids were found (see image4 and image3).  This was otherwise a normal examination of the colon (see image1 and image2). tortuous colon in the pelvis, thick folds but no diverticuli   Retroflexed views in the rectum revealed no abnormalities.    The scope was then withdrawn from the patient and the procedure completed.  COMPLICATIONS:  None ENDOSCOPIC IMPRESSION: 1) Diminutive polyp 2) Internal hemorrhoids 3) Otherwise normal examination functional condtopation likely due to redundant sigmoid colon and slow transit RECOMMENDATIONS: 1) Await pathology results Miralax 17 gm daily, add Mad Oxide 500mg , 2 po qd prn floowo up office visit to discuss constipation  REPEAT EXAM:  In 10 year(s) for.  ______________________________ Hedwig Morton. [name], MD  CC:  n. eSIGNED:   Hedwig Morton.  Myley Bahner at 05/17/2012 09:34 AM  Karen Chafe, [id number]

## 2012-05-17 NOTE — Patient Instructions (Addendum)

## 2012-05-17 NOTE — Progress Notes (Signed)
Patient did not experience any of the following events: a burn prior to discharge; a fall within the facility; wrong site/side/patient/procedure/implant event; or a hospital transfer or hospital admission upon discharge from the facility. (G8907) Patient did not have preoperative order for IV antibiotic SSI prophylaxis. (G8918)  

## 2012-05-18 ENCOUNTER — Telehealth: Payer: Self-pay

## 2012-05-18 NOTE — Telephone Encounter (Signed)
  Follow up Call-  Call back number 05/17/2012  Post procedure Call Back phone  # [phone]  Permission to leave phone message Yes     Patient questions:  Do you have a fever, pain , or abdominal swelling? no Pain Score  0 *  Have you tolerated food without any problems? yes  Have you been able to return to your normal activities? yes  Do you have any questions about your discharge instructions: Diet   no Medications  no Follow up visit  no  Do you have questions or concerns about your Care? no  Actions: * If pain score is 4 or above: No action needed, pain <4.

## 2012-05-22 ENCOUNTER — Encounter: Payer: Self-pay | Admitting: Internal Medicine

## 2012-05-23 ENCOUNTER — Ambulatory Visit: Payer: Medicare Other

## 2012-06-02 ENCOUNTER — Encounter: Payer: Self-pay | Admitting: *Deleted

## 2012-06-06 ENCOUNTER — Ambulatory Visit
Admission: RE | Admit: 2012-06-06 | Discharge: 2012-06-06 | Disposition: A | Discharge status: Home / Self Care | Payer: Medicare Other | Source: Ambulatory Visit | Attending: Oncology | Admitting: Oncology

## 2012-06-06 DIAGNOSIS — Z853 Personal history of malignant neoplasm of breast: Secondary | ICD-10-CM

## 2012-06-14 ENCOUNTER — Other Ambulatory Visit: Payer: Self-pay | Admitting: Oncology

## 2012-06-14 ENCOUNTER — Other Ambulatory Visit: Payer: Medicare Other | Admitting: Lab

## 2012-06-14 ENCOUNTER — Ambulatory Visit (HOSPITAL_BASED_OUTPATIENT_CLINIC_OR_DEPARTMENT_OTHER): Payer: Medicare Other

## 2012-06-14 ENCOUNTER — Ambulatory Visit: Payer: Medicare Other

## 2012-06-14 VITALS — BP 146/64 | HR 70 | Temp 97.6°F

## 2012-06-14 DIAGNOSIS — Z5111 Encounter for antineoplastic chemotherapy: Secondary | ICD-10-CM

## 2012-06-14 DIAGNOSIS — C50219 Malignant neoplasm of upper-inner quadrant of unspecified female breast: Secondary | ICD-10-CM

## 2012-06-14 DIAGNOSIS — Z853 Personal history of malignant neoplasm of breast: Secondary | ICD-10-CM

## 2012-06-14 DIAGNOSIS — C50919 Malignant neoplasm of unspecified site of unspecified female breast: Secondary | ICD-10-CM

## 2012-06-14 DIAGNOSIS — R197 Diarrhea, unspecified: Secondary | ICD-10-CM

## 2012-06-14 LAB — CBC WITH DIFFERENTIAL/PLATELET
Basophils Absolute: 0 10*3/uL (ref 0.0–0.1)
Eosinophils Absolute: 0.1 10*3/uL (ref 0.0–0.5)
HGB: 12.1 g/dL (ref 11.6–15.9)
MCV: 94.5 fL (ref 79.5–101.0)
MONO%: 8.5 % (ref 0.0–14.0)
NEUT#: 3.1 10*3/uL (ref 1.5–6.5)
RDW: 12.3 % (ref 11.2–14.5)
lymph#: 1.4 10*3/uL (ref 0.9–3.3)

## 2012-06-14 LAB — COMPREHENSIVE METABOLIC PANEL (CC13)
Albumin: 3.8 g/dL (ref 3.5–5.0)
Alkaline Phosphatase: 79 U/L (ref 40–150)
BUN: 10 mg/dL (ref 7.0–26.0)
Glucose: 104 mg/dl — ABNORMAL HIGH (ref 70–99)
Potassium: 4.1 mEq/L (ref 3.5–5.1)

## 2012-06-14 MED ORDER — FULVESTRANT 250 MG/5ML IM SOLN
500.0000 mg | Freq: Once | INTRAMUSCULAR | Status: AC
  Administered 2012-06-14: 500 mg via INTRAMUSCULAR
  Filled 2012-06-14: qty 10

## 2012-06-20 ENCOUNTER — Ambulatory Visit: Payer: Medicare Other | Admitting: Internal Medicine

## 2012-07-12 ENCOUNTER — Ambulatory Visit: Payer: Medicare Other

## 2012-07-12 ENCOUNTER — Ambulatory Visit (HOSPITAL_BASED_OUTPATIENT_CLINIC_OR_DEPARTMENT_OTHER): Payer: Medicare Other

## 2012-07-12 ENCOUNTER — Other Ambulatory Visit (HOSPITAL_BASED_OUTPATIENT_CLINIC_OR_DEPARTMENT_OTHER): Payer: Medicare Other | Admitting: Lab

## 2012-07-12 ENCOUNTER — Other Ambulatory Visit: Payer: Self-pay | Admitting: *Deleted

## 2012-07-12 VITALS — BP 131/63 | HR 65 | Temp 97.8°F

## 2012-07-12 DIAGNOSIS — D0590 Unspecified type of carcinoma in situ of unspecified breast: Secondary | ICD-10-CM

## 2012-07-12 DIAGNOSIS — C50219 Malignant neoplasm of upper-inner quadrant of unspecified female breast: Secondary | ICD-10-CM

## 2012-07-12 DIAGNOSIS — Z853 Personal history of malignant neoplasm of breast: Secondary | ICD-10-CM

## 2012-07-12 DIAGNOSIS — Z5111 Encounter for antineoplastic chemotherapy: Secondary | ICD-10-CM

## 2012-07-12 DIAGNOSIS — C50919 Malignant neoplasm of unspecified site of unspecified female breast: Secondary | ICD-10-CM

## 2012-07-12 LAB — CBC WITH DIFFERENTIAL/PLATELET
Basophils Absolute: 0 10*3/uL (ref 0.0–0.1)
Eosinophils Absolute: 0.1 10*3/uL (ref 0.0–0.5)
HCT: 37.2 % (ref 34.8–46.6)
LYMPH%: 31 % (ref 14.0–49.7)
MONO#: 0.4 10*3/uL (ref 0.1–0.9)
NEUT#: 2.7 10*3/uL (ref 1.5–6.5)
NEUT%: 57 % (ref 38.4–76.8)
Platelets: 234 10*3/uL (ref 145–400)
WBC: 4.8 10*3/uL (ref 3.9–10.3)

## 2012-07-12 LAB — COMPREHENSIVE METABOLIC PANEL (CC13)
CO2: 25 mEq/L (ref 22–29)
Creatinine: 0.7 mg/dL (ref 0.6–1.1)
Glucose: 89 mg/dl (ref 70–99)
Total Bilirubin: 0.3 mg/dL (ref 0.20–1.20)

## 2012-07-12 MED ORDER — FULVESTRANT 250 MG/5ML IM SOLN
500.0000 mg | Freq: Once | INTRAMUSCULAR | Status: AC
  Administered 2012-07-12: 500 mg via INTRAMUSCULAR
  Filled 2012-07-12: qty 10

## 2012-07-13 LAB — CANCER ANTIGEN 27.29: CA 27.29: 57 U/mL — ABNORMAL HIGH (ref 0–39)

## 2012-07-21 ENCOUNTER — Encounter: Payer: Self-pay | Admitting: Cardiology

## 2012-07-21 DIAGNOSIS — M797 Fibromyalgia: Secondary | ICD-10-CM | POA: Insufficient documentation

## 2012-07-21 DIAGNOSIS — I10 Essential (primary) hypertension: Secondary | ICD-10-CM | POA: Insufficient documentation

## 2012-07-21 DIAGNOSIS — H8109 Meniere's disease, unspecified ear: Secondary | ICD-10-CM | POA: Insufficient documentation

## 2012-07-21 DIAGNOSIS — I493 Ventricular premature depolarization: Secondary | ICD-10-CM | POA: Insufficient documentation

## 2012-07-21 DIAGNOSIS — E119 Type 2 diabetes mellitus without complications: Secondary | ICD-10-CM | POA: Insufficient documentation

## 2012-08-08 ENCOUNTER — Other Ambulatory Visit: Payer: Self-pay | Admitting: Physician Assistant

## 2012-08-08 DIAGNOSIS — Z853 Personal history of malignant neoplasm of breast: Secondary | ICD-10-CM

## 2012-08-09 ENCOUNTER — Ambulatory Visit (HOSPITAL_BASED_OUTPATIENT_CLINIC_OR_DEPARTMENT_OTHER): Payer: Medicare Other | Admitting: Physician Assistant

## 2012-08-09 ENCOUNTER — Ambulatory Visit: Payer: Medicare Other

## 2012-08-09 ENCOUNTER — Other Ambulatory Visit (HOSPITAL_BASED_OUTPATIENT_CLINIC_OR_DEPARTMENT_OTHER): Payer: Medicare Other | Admitting: Lab

## 2012-08-09 ENCOUNTER — Telehealth: Payer: Self-pay | Admitting: *Deleted

## 2012-08-09 ENCOUNTER — Telehealth: Payer: Self-pay | Admitting: Oncology

## 2012-08-09 ENCOUNTER — Encounter: Payer: Self-pay | Admitting: Physician Assistant

## 2012-08-09 VITALS — BP 147/68 | HR 73 | Temp 97.2°F | Resp 20 | Ht 64.0 in | Wt 148.5 lb

## 2012-08-09 DIAGNOSIS — Z853 Personal history of malignant neoplasm of breast: Secondary | ICD-10-CM

## 2012-08-09 DIAGNOSIS — Z5111 Encounter for antineoplastic chemotherapy: Secondary | ICD-10-CM

## 2012-08-09 DIAGNOSIS — C50219 Malignant neoplasm of upper-inner quadrant of unspecified female breast: Secondary | ICD-10-CM

## 2012-08-09 DIAGNOSIS — G629 Polyneuropathy, unspecified: Secondary | ICD-10-CM

## 2012-08-09 DIAGNOSIS — M949 Disorder of cartilage, unspecified: Secondary | ICD-10-CM

## 2012-08-09 LAB — CBC WITH DIFFERENTIAL/PLATELET
Basophils Absolute: 0 10*3/uL (ref 0.0–0.1)
Eosinophils Absolute: 0.1 10*3/uL (ref 0.0–0.5)
HCT: 36.6 % (ref 34.8–46.6)
HGB: 12.7 g/dL (ref 11.6–15.9)
MONO#: 0.5 10*3/uL (ref 0.1–0.9)
NEUT#: 3.5 10*3/uL (ref 1.5–6.5)
NEUT%: 64.7 % (ref 38.4–76.8)
WBC: 5.4 10*3/uL (ref 3.9–10.3)
lymph#: 1.3 10*3/uL (ref 0.9–3.3)

## 2012-08-09 LAB — COMPREHENSIVE METABOLIC PANEL (CC13)
Albumin: 4 g/dL (ref 3.5–5.0)
BUN: 14 mg/dL (ref 7.0–26.0)
Calcium: 10.1 mg/dL (ref 8.4–10.4)
Chloride: 107 mEq/L (ref 98–107)
Glucose: 102 mg/dl — ABNORMAL HIGH (ref 70–99)
Potassium: 3.9 mEq/L (ref 3.5–5.1)
Sodium: 142 mEq/L (ref 136–145)
Total Protein: 7.3 g/dL (ref 6.4–8.3)

## 2012-08-09 MED ORDER — GABAPENTIN 100 MG PO CAPS: ORAL_CAPSULE | ORAL | Status: DC

## 2012-08-09 MED ORDER — FULVESTRANT 250 MG/5ML IM SOLN
500.0000 mg | Freq: Once | INTRAMUSCULAR | Status: AC
  Administered 2012-08-09: 500 mg via INTRAMUSCULAR
  Filled 2012-08-09: qty 10

## 2012-08-09 NOTE — Telephone Encounter (Signed)
Per staff message and POF I have scheduled appt.  JMW  

## 2012-08-09 NOTE — Telephone Encounter (Signed)
gve the pt her nov-march 2014 appt calendar. Sent michelle a staff message to add the zometa tx appt for march 2014.

## 2012-08-09 NOTE — Progress Notes (Signed)
ID: Jamie [name]   DOB: 03/15/1936  MR#: [id number]  ION#:[id number]  HISTORY OF PRESENT ILLNESS: The patient's cancer was uncovered by an unusually circuitous route. She presented with stomach upset.  Because of a history of colitis, Dr. Dorena Cookey who happened to be on call for Dr. Matthias Hughs, obtained CT of the abdomen and pelvis on August 30, 2005.  As far as the abdomen and pelvis were concerned, there was no evidence of colitis but there were multiple scattered sclerotic lesions in the bone suggesting possible sclerotic metastatic disease (versus osteopoikilosis.)  Accordingly a bone scan was obtained.  This was done on September 01, 2005, and found tiny sclerotic lesions, and in particular a lesion of concern in the sternum.  Accordingly a CT scan of the chest was obtained.  This found the sternal problem to correspond with degenerative changes. However, it also showed a soft tissue nodule in the left breast.  The patient then had mammograms on September 27, 2005.  This found two areas of spiculation and architectural distortion in the upper outer quadrant of the left breast.  By ultrasound these were hyperechoic and irregular, highly suspicious for carcinoma.  Biopsy was obtained the same day and showed ([id number], as well as [id number]) invasive lobular carcinoma, with both lesions biopsied being ER and PR positive, both herceptest positive, both negative by FISH.  Accordingly the patient was referred to Dr. Corliss Skains and breast MRI was obtained October 06, 2006.  No abnormal enhancement was noted in the right breast.  There was enhancing nodularity in the upper portion of the left breast where several discrete nodules were seen.  Accordingly Dr. Corliss Skains performed a left modified radical mastectomy on November 02, 2005.  The patient had a positive sentinel lymph node and Dr. Corliss Skains performed a left axillary lymph node dissection.  However, only three additional lymph nodes were recovered, two of which  also showed metastatic involvement (all had extra capsular extension.)  The patient's subsequent history is as detailed below  INTERVAL HISTORY: [name] returns today for routine six-month followup of her left breast carcinoma. She continues to receive Faslodex monthly, and is due for her next injection today. She received zoledronic acid annually, not due again until March of 2014.  Interval history is remarkable for and being followed by Dr. Donnie Aho for chest pain. She has had a heart monitor for the past several weeks. Currently she denies any chest pain or pressure. She continues to be followed by Dr. Lucianne Muss for diabetes, and sees Dr. Timothy Lasso as her primary care physician.   REVIEW OF SYSTEMS: [name] denies any recent illnesses and has had no fevers or chills. She has occasional hot flashes, although these appear to be worse during the summer than the winter. She denies any new cough, phlegm production, or shortness of breath. She denies any nausea or emesis and has had no recent change in bowel habits. She often has difficulty sleeping. Her legs and feet ache and it feels like there is "electricity" in them at night. No abnormal headaches or dizziness. She continues to have some anxiety and depression, although this appears to be well-controlled at the current time. She has no suicidal ideations.   Otherwise a detailed review of systems is stable and noncontributory.   PAST MEDICAL HISTORY: Past Medical History  Diagnosis Date  . Anxiety   . Asthma   . Breast cancer   . Depression   . Diabetes mellitus   . GERD (gastroesophageal reflux disease)   .  Hyperlipidemia   . Internal hemorrhoids   . Hx of adenomatous colonic polyps   . IBS (irritable bowel syndrome)   . Mitral valve prolapse   . Ischemic colitis   . Fibromyalgia   Significant for diabetes, hypercholesterolemia, asthma, mitral valve prolapse requiring antibiotic prophylaxis before any invasive procedures, Meniere's disease,  gastroesophageal reflux disease, osteoarthritis, degenerative disk disease, history of fibromyalgia, panic attacks, history of synovial cyst removal, history of total hysterectomy with bilateral salpingo-oophorectomy, history of septoplasty x2, status post appendectomy, status post dilatation and curettage remotely, status post bilateral carpal tunnel release under Dr. Rosanne Ashing [place].  PAST SURGICAL HISTORY: Past Surgical History  Procedure Date  . Mastectomy     left  and then treated with chemo and radiation  . Total abdominal hysterectomy   . Back surgery     synovial cyst removed from spine  . Carpal tunnel release     bilateral  . Dilation and curettage of uterus   . Appendectomy     FAMILY HISTORY The patient's father died at the age of 82 from a stroke.  The patient's mother died at the age of 53, also from a stroke.  The patient has three brothers; one died with "liver disease."  One has prostate cancer.  The other brother has prostate and colon cancer.  The only breast cancer in the family was the patient's mother's mother and she does not know how old her grandmother was when she was diagnosed.  There is no history of ovarian cancer in the family.  GYNECOLOGIC HISTORY: She is GXP2.  She had her hysterectomy while still menstruating and she took hormone replacement until December 2006, when she had her breast biopsy.  SOCIAL HISTORY: She used to work as a Lawyer.  Her husband [name] (who is the son of my former patient Hessie Bargo) is semiretired, working for a Financial risk analyst.  What he likes to do is hunt and fish.  Their son Iantha Fallen had significant muscular dystrophy and died in 03/15/2007. Their daughter Marylu Lund, 64 years old, lives in [place] and works in an office.  The patient has two granddaughters, both RNs, one working at [employer] and the other in [place].    ADVANCED DIRECTIVES:  HEALTH MAINTENANCE: History  Substance Use Topics  . Smoking status: Never Smoker   .  Smokeless tobacco: Never Used  . Alcohol Use: No     Colonoscopy:  PAP:  Bone density:  Lipid panel:  Allergies  Allergen Reactions  . Niacin   . Promethazine Hcl     Current Outpatient Prescriptions  Medication Sig Dispense Refill  . BYSTOLIC 5 MG tablet Take 1 tablet by mouth Daily.      . clonazePAM (KLONOPIN) 0.5 MG tablet Take 0.5 mg by mouth 2 (two) times daily.        . CRESTOR 5 MG tablet Take 1 tablet by mouth Daily.      . fulvestrant (FASLODEX) 250 MG/5ML injection Inject 250 mg into the muscle every 30 (thirty) days. One injection each buttock over 1-2 minutes. Warm prior to use.      . gabapentin (NEURONTIN) 100 MG capsule 1-2 tabs PO QHS  60 capsule  4  . glimepiride (AMARYL) 1 MG tablet Take 0.5 tablets by mouth Daily.       . metFORMIN (GLUCOPHAGE) 500 MG tablet Take 1 tablet by mouth Daily.      Marland Kitchen NEXIUM 40 MG capsule Take 1 tablet by mouth Daily.      Marland Kitchen  ONGLYZA 5 MG TABS tablet Take 5 mg by mouth daily.       Marland Kitchen PROAIR HFA 108 (90 BASE) MCG/ACT inhaler       . ranitidine (ZANTAC) 300 MG tablet Take 300 mg by mouth daily.       Marland Kitchen triamterene-hydrochlorothiazide (MAXZIDE) 75-50 MG per tablet       . VOLTAREN 1 % GEL       . zolendronic acid (ZOMETA) 4 MG/5ML injection Inject 4 mg into the vein once. Once a year      . dicyclomine (BENTYL) 10 MG capsule Take 10 mg by mouth 2 times daily at 12 noon and 4 pm.        . fluconazole (DIFLUCAN) 150 MG tablet       . HYDROCHLOROTHIAZIDE PO Take 75 mg by mouth as needed. swelling       . meclizine (ANTIVERT) 25 MG tablet Take 1 tablet by mouth as needed. dizziness      . mometasone-formoterol (DULERA) 100-5 MCG/ACT AERO Inhale 2 puffs into the lungs 2 (two) times daily.      Marland Kitchen MOVIPREP 100 G SOLR Take 1 kit (100 g total) by mouth once.  1 kit  0  . NASONEX 50 MCG/ACT nasal spray 1 spray by Nasal route at bedtime.      Marland Kitchen nystatin (MYCOSTATIN) 100000 UNIT/ML suspension       . sucralfate (CARAFATE) 1 G tablet Take 1 g by  mouth as needed. Abdominal discomfort        Current Facility-Administered Medications  Medication Dose Route Frequency Provider Last Rate Last Dose  . fulvestrant (FASLODEX) injection 500 mg  500 mg Intramuscular Once Lowella Dell, MD        OBJECTIVE: Middle-aged white woman in no acute distress Filed Vitals:   08/09/12 1023  BP: 147/68  Pulse: 73  Temp: 97.2 F (36.2 C)  Resp: 20     Body mass index is 25.49 kg/(m^2).    ECOG FS: 1 Filed Weights   08/09/12 1023  Weight: 148 lb 8 oz (67.359 kg)   Sclerae unicteric Oropharynx clear No peripheral adenopathy Lungs no rales or rhonchi Heart regular rate and rhythm Abd soft, nontender, with positive bowel sounds MSK no focal spinal tenderness, no peripheral edema Neuro: nonfocal, alert and oriented x3 Breasts: the right breast is unremarkable; the left breast is status post mastectomy. There is no evidence of local recurrence. Axillae are benign bilaterally with no adenopathy  LAB RESULTS: Lab Results  Component Value Date   WBC 5.4 08/09/2012   NEUTROABS 3.5 08/09/2012   HGB 12.7 08/09/2012   HCT 36.6 08/09/2012   MCV 95.7 08/09/2012   PLT 243 08/09/2012      Chemistry      Component Value Date/Time   NA 142 08/09/2012 1007   NA 140 05/11/2012 0924   K 3.9 08/09/2012 1007   K 4.0 05/11/2012 0924   CL 107 08/09/2012 1007   CL 105 05/11/2012 0924   CO2 23 08/09/2012 1007   CO2 27 05/11/2012 0924   BUN 14.0 08/09/2012 1007   BUN 14 05/11/2012 0924   CREATININE 0.7 08/09/2012 1007   CREATININE 0.66 05/11/2012 0924      Component Value Date/Time   CALCIUM 10.1 08/09/2012 1007   CALCIUM 9.5 05/11/2012 0924   ALKPHOS 90 08/09/2012 1007   ALKPHOS 67 05/11/2012 0924   AST 20 08/09/2012 1007   AST 17 05/11/2012 0924   ALT 20 08/09/2012  1007   ALT 12 05/11/2012 0924   BILITOT 0.30 08/09/2012 1007   BILITOT 0.3 05/11/2012 1610       Lab Results  Component Value Date   LABCA2 57* 07/12/2012    STUDIES: Repeat  mammography will be due July 2013  ASSESSMENT:76 year old [place] woman    (1) status post left modified radical mastectomy in January 2007 for a T1c N1, stage IIA  invasive lobular carcinoma, grade 1, strongly estrogen and progesterone receptor positive, HER-2 negative, with an MIB-1 of 7%.  (2)  Status post radiation given concurrently with Capecitabine.  Declined chemotherapy.    (3) Status post anastrozole and exemestane, both discontinued due to aches and pains.  Status post tamoxifen between October 2007 until August 2011.  Discontinued secondary to cramps   (4) multiple sclerotic bony lesions noted at initial diagnosis, biopsied x2 August 2009 without malignancy documented, on zoledronic acid annually starting 12/12/2007, last given in March 2013.  (5) On fulvestrant since September 2011, with good tolerance  PLAN: From a breast cancer point of view, [name] is doing very well. We will continue with fulvestrant given every 28 days. She'll return to see [place] for routine followup in March, when she is due for zoledronic acid as well.  I have prescribed gabapentin, 100-200 mg at bedtime, in the hopes that this will help with what sounds like peripheral neuropathy in her legs and feet, and will likely help somewhat with her insomnia as well.  She'll continue followup with Dr. Timothy Lasso, Lucianne Muss, and [place] for her multiple comorbidities. She knows to call with any problems or questions prior to her next scheduled appointment.   Malesha Suliman    08/09/2012

## 2012-08-10 ENCOUNTER — Ambulatory Visit: Payer: Medicare Other | Admitting: Physician Assistant

## 2012-08-21 ENCOUNTER — Telehealth: Payer: Self-pay | Admitting: Internal Medicine

## 2012-08-21 NOTE — Telephone Encounter (Signed)
Patient reports her "stomach has been giving me a fit for a month." Reports bloating, burping, gas and abdominal pain above the belly button after eating. She was told by her pulmonary MD to increase Nexium to BID and take Ranitidine nightly. She is also taking Tums. None of this seems to help. Scheduled patient to be seen by Mike Gip, PA on 08/23/12 at 10:30 AM. Hx- met. Breast Ca, gastritis, ischemic colitis,  Functional constipation. Last colon- 05/17/12- internal hems, adenomatous polyp.

## 2012-08-21 NOTE — Telephone Encounter (Signed)
I agree with her seeing Jamie [name]. DB

## 2012-08-23 ENCOUNTER — Encounter: Payer: Self-pay | Admitting: Physician Assistant

## 2012-08-23 ENCOUNTER — Other Ambulatory Visit (INDEPENDENT_AMBULATORY_CARE_PROVIDER_SITE_OTHER): Payer: Medicare Other

## 2012-08-23 ENCOUNTER — Ambulatory Visit (INDEPENDENT_AMBULATORY_CARE_PROVIDER_SITE_OTHER): Payer: Medicare Other | Admitting: Physician Assistant

## 2012-08-23 VITALS — BP 122/72 | HR 68 | Ht 64.5 in | Wt 150.6 lb

## 2012-08-23 DIAGNOSIS — Z853 Personal history of malignant neoplasm of breast: Secondary | ICD-10-CM

## 2012-08-23 DIAGNOSIS — R109 Unspecified abdominal pain: Secondary | ICD-10-CM

## 2012-08-23 DIAGNOSIS — R143 Flatulence: Secondary | ICD-10-CM

## 2012-08-23 DIAGNOSIS — C7952 Secondary malignant neoplasm of bone marrow: Secondary | ICD-10-CM

## 2012-08-23 DIAGNOSIS — R141 Gas pain: Secondary | ICD-10-CM

## 2012-08-23 DIAGNOSIS — R142 Eructation: Secondary | ICD-10-CM

## 2012-08-23 DIAGNOSIS — C50919 Malignant neoplasm of unspecified site of unspecified female breast: Secondary | ICD-10-CM

## 2012-08-23 DIAGNOSIS — R140 Abdominal distension (gaseous): Secondary | ICD-10-CM

## 2012-08-23 DIAGNOSIS — C7951 Secondary malignant neoplasm of bone: Secondary | ICD-10-CM

## 2012-08-23 LAB — BASIC METABOLIC PANEL
BUN: 13 mg/dL (ref 6–23)
Chloride: 104 mEq/L (ref 96–112)
GFR: 100.05 mL/min (ref >60.00)
Potassium: 3.8 mEq/L (ref 3.5–5.1)
Sodium: 139 mEq/L (ref 135–145)

## 2012-08-23 MED ORDER — POLYETHYLENE GLYCOL 3350 17 GM/SCOOP PO POWD: 17.0000 g | Freq: Two times a day (BID) | ORAL | Status: DC

## 2012-08-23 NOTE — Patient Instructions (Addendum)
You are scheduled at [facility] for CT Abdomen/Pelvis   You are scheduled on 08-24-2012 at 9:30 AM. You should arrive 15 minutes prior to your appointment time for registration. Please follow the written instructions below on the day of your exam:  WARNING: IF YOU ARE ALLERGIC TO IODINE/X-RAY DYE, PLEASE NOTIFY RADIOLOGY IMMEDIATELY AT [phone]! YOU WILL BE GIVEN A 13 HOUR PREMEDICATION PREP.  1) Do not eat or drink anything after 5:30 AM (4 hours prior to your test) 2) You have been given 2 bottles of oral contrast to drink. The solution may taste better if refrigerated, but do NOT add ice or any other liquid to this solution. Shake well before drinking.    Drink 1 bottle of contrast @7 :30 AM (2 hours prior to your exam)  Drink 1 bottle of contrast @ 8:30 AM (1 hour prior to your exam)  You may take any medications as prescribed with a small amount of water except for the following: Metformin, Glucophage, Glucovance, Avandamet, Riomet, Fortamet, Actoplus Met, Janumet, Glumetza or Metaglip. The above medications must be held the day of the exam AND 48 hours after the exam.  The purpose of you drinking the oral contrast is to aid in the visualization of your intestinal tract. The contrast solution may cause some diarrhea. Before your exam is started, you will be given a small amount of fluid to drink. Depending on your individual set of symptoms, you may also receive an intravenous injection of x-ray contrast/dye. Plan on being at [facility] for 30 minutes or long, depending on the type of exam you are having performed.  This test typically takes 30-45 minutes to complete.  If you have any questions regarding your exam or if you need to reschedule, you may call the CT department at [phone] between the hours of 8:00 am and 5:00 pm, Monday-Friday.  ________________________________________________________________________  Please increase Miralax to twice daily   Continue  Nexium twice daily   Please purchase Phazyme over the counter and use as directed   Please go to basement for lab work before leaving

## 2012-08-24 ENCOUNTER — Other Ambulatory Visit: Payer: Self-pay | Admitting: *Deleted

## 2012-08-24 ENCOUNTER — Encounter: Payer: Self-pay | Admitting: Physician Assistant

## 2012-08-24 ENCOUNTER — Telehealth: Payer: Self-pay | Admitting: Physician Assistant

## 2012-08-24 ENCOUNTER — Ambulatory Visit (HOSPITAL_COMMUNITY)
Admission: RE | Admit: 2012-08-24 | Discharge: 2012-08-24 | Disposition: A | Discharge status: Home / Self Care | Payer: Medicare Other | Source: Ambulatory Visit | Attending: Physician Assistant | Admitting: Physician Assistant

## 2012-08-24 DIAGNOSIS — R142 Eructation: Secondary | ICD-10-CM | POA: Insufficient documentation

## 2012-08-24 DIAGNOSIS — Z853 Personal history of malignant neoplasm of breast: Secondary | ICD-10-CM

## 2012-08-24 DIAGNOSIS — R109 Unspecified abdominal pain: Secondary | ICD-10-CM

## 2012-08-24 DIAGNOSIS — C7952 Secondary malignant neoplasm of bone marrow: Secondary | ICD-10-CM | POA: Insufficient documentation

## 2012-08-24 DIAGNOSIS — R140 Abdominal distension (gaseous): Secondary | ICD-10-CM

## 2012-08-24 DIAGNOSIS — C50919 Malignant neoplasm of unspecified site of unspecified female breast: Secondary | ICD-10-CM | POA: Insufficient documentation

## 2012-08-24 DIAGNOSIS — C7951 Secondary malignant neoplasm of bone: Secondary | ICD-10-CM | POA: Insufficient documentation

## 2012-08-24 DIAGNOSIS — R141 Gas pain: Secondary | ICD-10-CM | POA: Insufficient documentation

## 2012-08-24 MED ORDER — IOHEXOL 300 MG/ML  SOLN
100.0000 mL | Freq: Once | INTRAMUSCULAR | Status: AC | PRN
  Administered 2012-08-24: 100 mL via INTRAVENOUS

## 2012-08-24 MED ORDER — SUCRALFATE 1 GM/10ML PO SUSP: ORAL | Status: DC

## 2012-08-24 NOTE — Progress Notes (Signed)
Subjective:    Patient ID: Jamie [name], female    DOB: [birth year], 76 y.o.   MRN: [id number]  HPI Sayana is a very nice 76 year old white female known to Dr. Lina Sar. She has history of chronic GERD and adenomatous colon polyps. She currently has a metastatic breast cancer widespread skeletal metastases, she is followed by Dr. Darnelle Catalan.. She also has diagnosis of adult-onset diabetes mellitus and fibromyalgia. She comes in today with complaints of abdominal discomfort and bloating despite twice daily Nexium. She lwas last seen in 2013 when she had colonoscopy, she had one polyp removed at that time which was adenomatous and also noted to have internal hemorrhoids. Her last CT scans were done in May of 2012. She states that she has had problems with reflux for many years, and has been seeing an allergist recently who increased her Nexium to twice daily. She says over the past several weeks she has been having more difficulty with gas belching burping and some abdominal distention. She is also developed some increased constipation. She had been taking Bentyl for IBS-type symptoms but stopped this with the constipation and is also taking MiraLax daily though she states she still going 4-5 days between bowel movements.  uses suppositories as necessary. She does say that she feels better after a bowel movement, but that this does not have an  effect on the bloating. Her appetite has been pretty good her weight is stable, she definitely has postprandial increase in symptoms with some early satiety fullness bloating and nausea without vomiting. She's not had any prior abdominal surgeries.    Review of Systems  Constitutional: Positive for appetite change.  HENT: Negative.   Eyes: Negative.   Respiratory: Negative.   Cardiovascular: Negative.   Gastrointestinal: Positive for nausea, abdominal pain, constipation and abdominal distention.  Genitourinary: Negative.   Musculoskeletal: Positive for  back pain.  Skin: Negative.   Neurological: Negative.   Hematological: Negative.   Psychiatric/Behavioral: Negative.    Outpatient Prescriptions Prior to Visit  Medication Sig Dispense Refill  . BYSTOLIC 5 MG tablet Take 1 tablet by mouth Daily.      . clonazePAM (KLONOPIN) 0.5 MG tablet Take 0.5 mg by mouth 2 (two) times daily.        . fulvestrant (FASLODEX) 250 MG/5ML injection Inject 250 mg into the muscle every 30 (thirty) days. One injection each buttock over 1-2 minutes. Warm prior to use.      . gabapentin (NEURONTIN) 100 MG capsule 1-2 tabs PO QHS  60 capsule  4  . glimepiride (AMARYL) 1 MG tablet Take 0.5 tablets by mouth Daily.       Marland Kitchen HYDROCHLOROTHIAZIDE PO Take 75 mg by mouth as needed. swelling       . metFORMIN (GLUCOPHAGE) 500 MG tablet Take 1 tablet by mouth Daily.      Marland Kitchen NASONEX 50 MCG/ACT nasal spray 1 spray by Nasal route at bedtime.      Marland Kitchen NEXIUM 40 MG capsule Take 1 tablet by mouth Daily.      Marland Kitchen nystatin (MYCOSTATIN) 100000 UNIT/ML suspension       . ranitidine (ZANTAC) 300 MG tablet Take 300 mg by mouth daily.       Marland Kitchen triamterene-hydrochlorothiazide (MAXZIDE) 75-50 MG per tablet       . VOLTAREN 1 % GEL       . zolendronic acid (ZOMETA) 4 MG/5ML injection Inject 4 mg into the vein once. Once a year      .  CRESTOR 5 MG tablet Take 1 tablet by mouth Daily.      Marland Kitchen dicyclomine (BENTYL) 10 MG capsule Take 10 mg by mouth 2 times daily at 12 noon and 4 pm.        . fluconazole (DIFLUCAN) 150 MG tablet       . meclizine (ANTIVERT) 25 MG tablet Take 1 tablet by mouth as needed. dizziness      . mometasone-formoterol (DULERA) 100-5 MCG/ACT AERO Inhale 2 puffs into the lungs 2 (two) times daily.      . ONGLYZA 5 MG TABS tablet Take 5 mg by mouth daily.       Marland Kitchen PROAIR HFA 108 (90 BASE) MCG/ACT inhaler       . sucralfate (CARAFATE) 1 G tablet Take 1 g by mouth as needed. Abdominal discomfort       . MOVIPREP 100 G SOLR Take 1 kit (100 g total) by mouth once.  1 kit  0   No  facility-administered medications prior to visit.   Allergies  Allergen Reactions  . Niacin   . Promethazine Hcl    Patient Active Problem List  Diagnosis  . Anxiety disorder with panic attacks  . DEPRESSION  . Asthma  . GERD  . Irritable bowel syndrome  . Personal history of breast cancer  . Mitral valve prolapse  . Personal history of colon polyps  . History of ischemic colitis  . Diabetes mellitus type 2, noninsulin dependent  . Hypertension  . Meniere syndrome  . Symptomatic PVCs  . Fibromyalgia  . Breast cancer metastasized to bone   History  Substance Use Topics  . Smoking status: Never Smoker   . Smokeless tobacco: Never Used  . Alcohol Use: No       Objective:   Physical Exam well-developed older white female in no acute distress very pleasant blood pressure 122/72 pulse 68 height 5 foot 4 weight 150. HEENT; nontraumatic normocephalic EOMI PERRLA sclera anicteric, Neck;Supple no JVD, Cardiovascular; regular rate and rhythm with S1-S2 no murmur rub or gallop, Pulmonary; clear bilaterally, Abdomen ;somewhat full feeling as though soft bowel sounds are active no palpable mass or hepatosplenomegaly no focal tenderness, Rectal; exam not done, Extremities ;no clubbing cyanosis or edema skin warm and dry, Psych; mood and affect appropriate;        Assessment & Plan:  #63 76 year old female with metastatic breast cancer with widespread skeletal metastases, presenting with several week history of abdominal fullness some distention constipation and increased indigestion with postprandial bloating early satiety and nausea. Her symptoms may be functional and/or partially related to medications, however given history of metastatic breast cancer she needs further imaging to rule out intra-abdominal spread of her disease. #2 adult-onset diabetes mellitus #3 chronic GERD-on twice a day Nexium #4 fibromyalgia #5 history of adenomatous colon polyps-up-to-date on colon screening  last: July 2013  Plan; we'll continue twice-daily Nexium for now Continue MiraLax but increase to 2 doses per day Trial of Phazyme with each meal Schedule for CT scan of the abdomen and pelvis-further plans pending results of CT

## 2012-08-24 NOTE — Progress Notes (Signed)
Agree with initial assessment and plans. Physician assistant will followup on imaging

## 2012-08-24 NOTE — Telephone Encounter (Signed)
I called [name] and advised her that Jamie [name] said the CT scan was normal. There was no sign of any problem on it.  Jamie [name] said that  you can sometimes have stomach growling after drinking the contrast.  You can also have some loose stools from the contrast.  Jamie [name] said she can have a refill of the Carafate Suspension.  I sent the prescription to [facility]. The patient thanked me for calling.

## 2012-09-06 ENCOUNTER — Other Ambulatory Visit (HOSPITAL_BASED_OUTPATIENT_CLINIC_OR_DEPARTMENT_OTHER): Payer: Medicare Other | Admitting: Lab

## 2012-09-06 ENCOUNTER — Ambulatory Visit (HOSPITAL_BASED_OUTPATIENT_CLINIC_OR_DEPARTMENT_OTHER): Payer: Medicare Other

## 2012-09-06 VITALS — BP 136/59 | HR 77 | Temp 97.7°F

## 2012-09-06 DIAGNOSIS — C50219 Malignant neoplasm of upper-inner quadrant of unspecified female breast: Secondary | ICD-10-CM

## 2012-09-06 DIAGNOSIS — Z853 Personal history of malignant neoplasm of breast: Secondary | ICD-10-CM

## 2012-09-06 DIAGNOSIS — Z5111 Encounter for antineoplastic chemotherapy: Secondary | ICD-10-CM

## 2012-09-06 LAB — COMPREHENSIVE METABOLIC PANEL (CC13)
Alkaline Phosphatase: 94 U/L (ref 40–150)
BUN: 15 mg/dL (ref 7.0–26.0)
CO2: 28 mEq/L (ref 22–29)
Creatinine: 0.7 mg/dL (ref 0.6–1.1)
Glucose: 99 mg/dl (ref 70–99)
Sodium: 140 mEq/L (ref 136–145)
Total Bilirubin: 0.35 mg/dL (ref 0.20–1.20)
Total Protein: 7 g/dL (ref 6.4–8.3)

## 2012-09-06 LAB — CBC WITH DIFFERENTIAL/PLATELET
Basophils Absolute: 0 10*3/uL (ref 0.0–0.1)
Eosinophils Absolute: 0.1 10*3/uL (ref 0.0–0.5)
HCT: 37.9 % (ref 34.8–46.6)
HGB: 12.9 g/dL (ref 11.6–15.9)
LYMPH%: 26.1 % (ref 14.0–49.7)
MONO#: 0.5 10*3/uL (ref 0.1–0.9)
NEUT%: 61.5 % (ref 38.4–76.8)
Platelets: 256 10*3/uL (ref 145–400)
WBC: 5 10*3/uL (ref 3.9–10.3)
lymph#: 1.3 10*3/uL (ref 0.9–3.3)

## 2012-09-06 MED ORDER — FULVESTRANT 250 MG/5ML IM SOLN
500.0000 mg | Freq: Once | INTRAMUSCULAR | Status: AC
  Administered 2012-09-06: 500 mg via INTRAMUSCULAR
  Filled 2012-09-06: qty 10

## 2012-09-13 ENCOUNTER — Ambulatory Visit: Payer: Medicare Other

## 2012-09-19 ENCOUNTER — Telehealth: Payer: Self-pay | Admitting: Internal Medicine

## 2012-09-21 ENCOUNTER — Other Ambulatory Visit: Payer: Self-pay | Admitting: *Deleted

## 2012-09-21 ENCOUNTER — Telehealth: Payer: Self-pay | Admitting: *Deleted

## 2012-09-21 MED ORDER — SUCRALFATE 1 G PO TABS: 1.0000 g | ORAL_TABLET | Freq: Four times a day (QID) | ORAL | Status: DC

## 2012-09-21 NOTE — Telephone Encounter (Signed)
The patient had called and asked to have the Carafate tablets instead of the suspension. The pt said the tablets work better for her.  I changed the RX to Tablets.

## 2012-09-22 NOTE — Telephone Encounter (Signed)
See other phone note

## 2012-10-04 ENCOUNTER — Other Ambulatory Visit (HOSPITAL_BASED_OUTPATIENT_CLINIC_OR_DEPARTMENT_OTHER): Payer: Medicare Other | Admitting: Lab

## 2012-10-04 ENCOUNTER — Ambulatory Visit (HOSPITAL_BASED_OUTPATIENT_CLINIC_OR_DEPARTMENT_OTHER): Payer: Medicare Other

## 2012-10-04 VITALS — BP 135/64 | HR 72 | Temp 97.3°F

## 2012-10-04 DIAGNOSIS — Z853 Personal history of malignant neoplasm of breast: Secondary | ICD-10-CM

## 2012-10-04 DIAGNOSIS — C50219 Malignant neoplasm of upper-inner quadrant of unspecified female breast: Secondary | ICD-10-CM

## 2012-10-04 DIAGNOSIS — Z5111 Encounter for antineoplastic chemotherapy: Secondary | ICD-10-CM

## 2012-10-04 LAB — CBC WITH DIFFERENTIAL/PLATELET
Basophils Absolute: 0 10*3/uL (ref 0.0–0.1)
Eosinophils Absolute: 0.1 10*3/uL (ref 0.0–0.5)
HGB: 12.2 g/dL (ref 11.6–15.9)
LYMPH%: 21.5 % (ref 14.0–49.7)
MONO#: 0.5 10*3/uL (ref 0.1–0.9)
NEUT#: 3.7 10*3/uL (ref 1.5–6.5)
Platelets: 243 10*3/uL (ref 145–400)
RBC: 3.82 10*6/uL (ref 3.70–5.45)
RDW: 12.5 % (ref 11.2–14.5)
WBC: 5.4 10*3/uL (ref 3.9–10.3)

## 2012-10-04 LAB — COMPREHENSIVE METABOLIC PANEL (CC13)
Albumin: 3.8 g/dL (ref 3.5–5.0)
BUN: 13 mg/dL (ref 7.0–26.0)
CO2: 30 mEq/L — ABNORMAL HIGH (ref 22–29)
Glucose: 101 mg/dl — ABNORMAL HIGH (ref 70–99)
Potassium: 4.2 mEq/L (ref 3.5–5.1)
Sodium: 142 mEq/L (ref 136–145)
Total Protein: 6.8 g/dL (ref 6.4–8.3)

## 2012-10-04 MED ORDER — FULVESTRANT 250 MG/5ML IM SOLN
500.0000 mg | Freq: Once | INTRAMUSCULAR | Status: AC
  Administered 2012-10-04: 500 mg via INTRAMUSCULAR
  Filled 2012-10-04: qty 10

## 2012-10-04 NOTE — Patient Instructions (Signed)
Call MD for problems 

## 2012-10-26 ENCOUNTER — Encounter: Payer: Self-pay | Admitting: *Deleted

## 2012-11-01 ENCOUNTER — Ambulatory Visit (HOSPITAL_BASED_OUTPATIENT_CLINIC_OR_DEPARTMENT_OTHER): Payer: Medicare Other

## 2012-11-01 ENCOUNTER — Other Ambulatory Visit (HOSPITAL_BASED_OUTPATIENT_CLINIC_OR_DEPARTMENT_OTHER): Payer: Medicare Other | Admitting: Lab

## 2012-11-01 VITALS — BP 133/64 | HR 100 | Temp 96.9°F

## 2012-11-01 DIAGNOSIS — C50219 Malignant neoplasm of upper-inner quadrant of unspecified female breast: Secondary | ICD-10-CM

## 2012-11-01 DIAGNOSIS — Z853 Personal history of malignant neoplasm of breast: Secondary | ICD-10-CM

## 2012-11-01 DIAGNOSIS — Z5111 Encounter for antineoplastic chemotherapy: Secondary | ICD-10-CM

## 2012-11-01 LAB — COMPREHENSIVE METABOLIC PANEL (CC13)
Albumin: 3.8 g/dL (ref 3.5–5.0)
Alkaline Phosphatase: 97 U/L (ref 40–150)
CO2: 31 mEq/L — ABNORMAL HIGH (ref 22–29)
Chloride: 103 mEq/L (ref 98–107)
Glucose: 105 mg/dl — ABNORMAL HIGH (ref 70–99)
Potassium: 4.3 mEq/L (ref 3.5–5.1)
Sodium: 143 mEq/L (ref 136–145)
Total Protein: 7.6 g/dL (ref 6.4–8.3)

## 2012-11-01 LAB — CBC WITH DIFFERENTIAL/PLATELET
Eosinophils Absolute: 0.1 10*3/uL (ref 0.0–0.5)
MONO#: 0.5 10*3/uL (ref 0.1–0.9)
NEUT#: 3.4 10*3/uL (ref 1.5–6.5)
RBC: 3.9 10*6/uL (ref 3.70–5.45)
RDW: 12.6 % (ref 11.2–14.5)
WBC: 5.5 10*3/uL (ref 3.9–10.3)
lymph#: 1.4 10*3/uL (ref 0.9–3.3)

## 2012-11-01 MED ORDER — FULVESTRANT 250 MG/5ML IM SOLN
500.0000 mg | Freq: Once | INTRAMUSCULAR | Status: AC
  Administered 2012-11-01: 500 mg via INTRAMUSCULAR
  Filled 2012-11-01: qty 10

## 2012-11-28 ENCOUNTER — Telehealth: Payer: Self-pay | Admitting: Physician Assistant

## 2012-11-28 ENCOUNTER — Encounter: Payer: Self-pay | Admitting: Physician Assistant

## 2012-11-28 ENCOUNTER — Telehealth: Payer: Self-pay | Admitting: Internal Medicine

## 2012-11-28 ENCOUNTER — Ambulatory Visit: Payer: Medicare Other | Admitting: Internal Medicine

## 2012-11-28 ENCOUNTER — Ambulatory Visit (INDEPENDENT_AMBULATORY_CARE_PROVIDER_SITE_OTHER): Payer: Medicare Other | Admitting: Physician Assistant

## 2012-11-28 VITALS — BP 128/60 | HR 66 | Ht 64.0 in | Wt 153.0 lb

## 2012-11-28 DIAGNOSIS — R6881 Early satiety: Secondary | ICD-10-CM

## 2012-11-28 DIAGNOSIS — K219 Gastro-esophageal reflux disease without esophagitis: Secondary | ICD-10-CM

## 2012-11-28 DIAGNOSIS — R110 Nausea: Secondary | ICD-10-CM

## 2012-11-28 MED ORDER — ONDANSETRON HCL 4 MG PO TABS: ORAL_TABLET | ORAL | Status: DC

## 2012-11-28 NOTE — Telephone Encounter (Signed)
States for the last month, she has had gas, bloating and belching when she eats. For the last week, she has had abdominal pain above and below the belly button and nausea every time she eats. She is taking Nexium, Carafate(off and on not regularly) and Gaviscon. Hx breast cancer and currently receiving Faslodex. Scheduled with Mike Gip, PA today at 3:00 PM.

## 2012-11-28 NOTE — Telephone Encounter (Signed)
The pt said she cannot afford the Zofran. She said before she took the Klonopin 3 times a day and it helped her and releived her nausea.  I told her I would let Amy Esterwood PA-C knowl

## 2012-11-28 NOTE — Patient Instructions (Addendum)
We scheduled the Gastric Emtpying scan at [facility] Radiology, 1st floor on 12-11-2012. Arrive at 7:45 AM.   Have nothing to eat or drink after midnight. Do not take the Dexilant after midnight that day.  We have given you samples of Dexilant.  Take 1 tab 30 min before breakfast. We sent a prescription of Zofran for nausea to [facility].  You have been scheduled for an endoscopy with propofol. Please follow written instructions given to you at your visit today. If you use inhalers (even only as needed) or a CPAP machine, please bring them with you on the day of your procedure.

## 2012-11-29 ENCOUNTER — Encounter: Payer: Self-pay | Admitting: Physician Assistant

## 2012-11-29 ENCOUNTER — Encounter: Payer: Self-pay | Admitting: Internal Medicine

## 2012-11-29 ENCOUNTER — Telehealth: Payer: Self-pay | Admitting: *Deleted

## 2012-11-29 ENCOUNTER — Other Ambulatory Visit (HOSPITAL_BASED_OUTPATIENT_CLINIC_OR_DEPARTMENT_OTHER): Payer: Medicare Other | Admitting: Lab

## 2012-11-29 ENCOUNTER — Ambulatory Visit (HOSPITAL_BASED_OUTPATIENT_CLINIC_OR_DEPARTMENT_OTHER): Payer: Medicare Other

## 2012-11-29 VITALS — BP 127/57 | HR 71 | Temp 97.2°F

## 2012-11-29 DIAGNOSIS — C50219 Malignant neoplasm of upper-inner quadrant of unspecified female breast: Secondary | ICD-10-CM

## 2012-11-29 DIAGNOSIS — Z853 Personal history of malignant neoplasm of breast: Secondary | ICD-10-CM

## 2012-11-29 DIAGNOSIS — Z5111 Encounter for antineoplastic chemotherapy: Secondary | ICD-10-CM

## 2012-11-29 LAB — CBC WITH DIFFERENTIAL/PLATELET
Basophils Absolute: 0 10*3/uL (ref 0.0–0.1)
EOS%: 2.4 % (ref 0.0–7.0)
Eosinophils Absolute: 0.1 10*3/uL (ref 0.0–0.5)
HCT: 37.7 % (ref 34.8–46.6)
HGB: 13.2 g/dL (ref 11.6–15.9)
MCH: 32.8 pg (ref 25.1–34.0)
MCV: 94 fL (ref 79.5–101.0)
MONO%: 8.3 % (ref 0.0–14.0)
NEUT%: 63.9 % (ref 38.4–76.8)
Platelets: 263 10*3/uL (ref 145–400)

## 2012-11-29 LAB — COMPREHENSIVE METABOLIC PANEL (CC13)
AST: 19 U/L (ref 5–34)
Alkaline Phosphatase: 90 U/L (ref 40–150)
BUN: 14.8 mg/dL (ref 7.0–26.0)
Calcium: 9.7 mg/dL (ref 8.4–10.4)
Creatinine: 0.7 mg/dL (ref 0.6–1.1)
Glucose: 106 mg/dl — ABNORMAL HIGH (ref 70–99)

## 2012-11-29 MED ORDER — FULVESTRANT 250 MG/5ML IM SOLN
500.0000 mg | Freq: Once | INTRAMUSCULAR | Status: AC
  Administered 2012-11-29: 500 mg via INTRAMUSCULAR
  Filled 2012-11-29: qty 10

## 2012-11-29 NOTE — Telephone Encounter (Signed)
Spoke with patient and she states she stopped the Bentyl 6 months ago.

## 2012-11-29 NOTE — Progress Notes (Signed)
Subjective:    Patient ID: Jamie [name], female    DOB: 11-07-1935, 77 y.o.   MRN: [id number]  HPI  Jamie [name] is a pleasant 77 year old white female known to Dr. Lina Sar. She does have history of adenomatous colon polyps and chronic GERD, adult onset diabetes mellitus and fibromyalgia. She has also been dealing with metastatic breast cancer with widespread skeletal metastases and is followed by Dr. Darnelle Catalan. She was last seen here in November of 2013 at that time with complaints of abdominal fullness indigestion bloating and nausea. Due to her history of metastatic breast cancer we obtain CT scan of the abdomen and pelvis which was negative for any evidence of metastatic cancer. Her last upper endoscopy was in 2008 and this was normal. She has not been seen in our office since then but comes back in today with complaints of worsening symptoms again over the past 3-4 weeks with nausea heartburn increased belching and burping and upper abdominal discomfort. She has had early satiety symptoms, but no vomiting. She says she feels very uncomfortable in her upper abdomen after eating frequently especially if she higher fiber foods area she says she feels queasy pretty much 24 hours a day. Her bowels have been moving fairly regularly and her weight has been stable. She has been taking Nexium twice daily and says she seems to have a lot of heartburn despite this. She did start her soft back on Carafate within the past week but has not noticed any difference. She asks if there's any other acid blocker she can take. She also mentions that she has had a lot of stress and anxiety and that previously increasing her dose of klonopin have helped some of her GI symptoms.    Review of Systems  Constitutional: Positive for appetite change.  HENT: Negative.   Eyes: Negative.   Respiratory: Negative.   Cardiovascular: Negative.   Gastrointestinal: Positive for nausea and abdominal pain.  Endocrine: Negative.    Genitourinary: Negative.   Musculoskeletal: Negative.   Skin: Negative.   Allergic/Immunologic: Negative.   Neurological: Negative.   Hematological: Negative.   Psychiatric/Behavioral: The patient is nervous/anxious.    Outpatient Prescriptions Prior to Visit  Medication Sig Dispense Refill  . BYSTOLIC 5 MG tablet Take 1 tablet by mouth Daily.      . clonazePAM (KLONOPIN) 0.5 MG tablet Take 0.5 mg by mouth 2 (two) times daily.        . CRESTOR 5 MG tablet Take 1 tablet by mouth Daily.      . fluconazole (DIFLUCAN) 150 MG tablet as needed.       . fulvestrant (FASLODEX) 250 MG/5ML injection Inject 250 mg into the muscle every 30 (thirty) days. One injection each buttock over 1-2 minutes. Warm prior to use.      Marland Kitchen glimepiride (AMARYL) 1 MG tablet Take 0.5 tablets by mouth Daily.       Marland Kitchen HYDROCHLOROTHIAZIDE PO Take 75 mg by mouth as needed. swelling       . meclizine (ANTIVERT) 25 MG tablet Take 1 tablet by mouth as needed. dizziness      . metFORMIN (GLUCOPHAGE) 500 MG tablet Take 1 tablet by mouth Daily.      Marland Kitchen NASONEX 50 MCG/ACT nasal spray 1 spray by Nasal route at bedtime.      Marland Kitchen NEXIUM 40 MG capsule Take 1 tablet by mouth Daily.      Marland Kitchen nystatin (MYCOSTATIN) 100000 UNIT/ML suspension       . ONGLYZA  5 MG TABS tablet Take 5 mg by mouth daily.       Marland Kitchen PROAIR HFA 108 (90 BASE) MCG/ACT inhaler       . sucralfate (CARAFATE) 1 G tablet Take 1 tablet (1 g total) by mouth 4 (four) times daily.  120 tablet  1  . polyethylene glycol powder (GLYCOLAX/MIRALAX) powder Take 17 g by mouth 2 (two) times daily.  255 g  3  . dicyclomine (BENTYL) 10 MG capsule Take 10 mg by mouth 2 times daily at 12 noon and 4 pm.        . gabapentin (NEURONTIN) 100 MG capsule 1-2 tabs PO QHS  60 capsule  4  . mometasone-formoterol (DULERA) 100-5 MCG/ACT AERO Inhale 2 puffs into the lungs 2 (two) times daily.      . ranitidine (ZANTAC) 300 MG tablet Take 300 mg by mouth daily.       Marland Kitchen triamterene-hydrochlorothiazide  (MAXZIDE) 75-50 MG per tablet       . VOLTAREN 1 % GEL       . zolendronic acid (ZOMETA) 4 MG/5ML injection Inject 4 mg into the vein once. Once a year       No facility-administered medications prior to visit.   Allergies  Allergen Reactions  . Niacin   . Promethazine Hcl    Patient Active Problem List  Diagnosis  . Anxiety disorder with panic attacks  . DEPRESSION  . Asthma  . GERD  . Irritable bowel syndrome  . Personal history of breast cancer  . Mitral valve prolapse  . Personal history of colon polyps  . History of ischemic colitis  . Diabetes mellitus type 2, noninsulin dependent  . Hypertension  . Meniere syndrome  . Symptomatic PVCs  . Fibromyalgia  . Breast cancer metastasized to bone   History  Substance Use Topics  . Smoking status: Never Smoker   . Smokeless tobacco: Never Used  . Alcohol Use: No   Jamie [name] had no medications administered during this visit.     Objective:   Physical Exam  Well-developed older white female in no acute distress, pleasant blood pressure 120/60 pulse 66 height 5 foot 4 weight 153. HEENT; nontraumatic normocephalic EOMI PERRLA sclera anicteric,Neck; Supple no JVD, Cardiovascular; regular rate and rhythm with S1-S2 no murmur or gallop, Pulmonary; clear bilaterally, Abdomen; soft nondistended no definite succussion splash she does have some tenderness across the upper abdomen no guarding or rebound no palpable mass or hepatosplenomegaly, Rectal; exam not done, Extremities; no clubbing cyanosis or edema skin warm and dry, Psych ; mood and affect of appropriate she is anxious        Assessment & Plan:  #4 77 year old female diabetic with history of metastatic breast cancer to the bones with several month history of increased upper abdominal gas bloating nausea discomfort and early satiety. She also complains of heartburn postprandially despite being on Nexium twice daily. Need to rule out gastroparesis, peptic ulcer disease and  neoplasm. She does have IBS history an underlying anxiety and once other etiologies are ruled out we can manage as functional symptoms  #2 history of adenomatous colon polyps-up-to-date with: Screening last colonoscopy July 2013 #3 fibromyalgia #4 chronic GERD-poorly controlled on twice a day acid blocker and therefore wonder if gastroparesis is contributing to symptoms  Plan;Marland KitchenNexium and will give her a trial of Dexilant 60 mg by mouth every morning-samples were given today. Trial of Zofran 4 mg by mouth twice daily for nausea Stop bentyl for now Schedule  for gastric emptying scan Schedule for upper endoscopy with Dr. [name] procedure was discussed in detail with the patient and she is agreeable to proceed I discussed the possibility of increasing the patient's Klonopin to twice daily he states has helped in the past, however her primary care physician was reluctant to institute  this. If EGD and gastric emptying scan are unremarkable and underlying anxiety appears to be the culprit she will benefit from increasing her Klonopin dosage

## 2012-11-29 NOTE — Telephone Encounter (Signed)
Message copied by Daphine Deutscher on Wed Nov 29, 2012 11:22 AM ------      Message from: [place], [place] S      Created: Wed Nov 29, 2012 11:17 AM      Regarding: med change       Rene Kocher, I saw this patient yesterday and after reviewing her meds again this morning I would like her to stop taking her dicyclomine/Bentyl as this may be worsening some of her upper GI symptoms. Please call her and notify her. thanks ------

## 2012-11-29 NOTE — Telephone Encounter (Signed)
Left a message for patient to call me. 

## 2012-11-29 NOTE — Progress Notes (Signed)
Reviewed, I totally agree with assessment. I will see her for EGD.

## 2012-12-04 ENCOUNTER — Telehealth: Payer: Self-pay | Admitting: Internal Medicine

## 2012-12-04 NOTE — Telephone Encounter (Signed)
No charge, please take her off the schedule

## 2012-12-05 ENCOUNTER — Encounter: Payer: Medicare Other | Admitting: Internal Medicine

## 2012-12-07 ENCOUNTER — Telehealth: Payer: Self-pay | Admitting: Physician Assistant

## 2012-12-07 NOTE — Telephone Encounter (Signed)
Spoke with patient and she cancelled the GES. States her husband has a medical problem she has to deal with. She does not want to reschedule at this time.

## 2012-12-11 ENCOUNTER — Ambulatory Visit (HOSPITAL_COMMUNITY): Payer: Medicare Other

## 2012-12-13 ENCOUNTER — Telehealth: Payer: Self-pay | Admitting: Internal Medicine

## 2012-12-13 NOTE — Telephone Encounter (Signed)
Patient calling to ask about her husband transferring care to Dr. [name] from another GI. Explained to her Dr. [name] will need the records from previous GI for Dr. [name] to review. She states she will work on getting those to [place].

## 2012-12-28 ENCOUNTER — Other Ambulatory Visit (HOSPITAL_BASED_OUTPATIENT_CLINIC_OR_DEPARTMENT_OTHER): Payer: Medicare Other | Admitting: Lab

## 2012-12-28 ENCOUNTER — Ambulatory Visit (HOSPITAL_BASED_OUTPATIENT_CLINIC_OR_DEPARTMENT_OTHER): Payer: Medicare Other | Admitting: Oncology

## 2012-12-28 ENCOUNTER — Ambulatory Visit (HOSPITAL_BASED_OUTPATIENT_CLINIC_OR_DEPARTMENT_OTHER): Payer: Medicare Other

## 2012-12-28 ENCOUNTER — Other Ambulatory Visit: Payer: Self-pay | Admitting: Oncology

## 2012-12-28 VITALS — BP 128/68 | HR 76 | Temp 97.8°F | Resp 20 | Ht 64.0 in | Wt 154.6 lb

## 2012-12-28 DIAGNOSIS — Z853 Personal history of malignant neoplasm of breast: Secondary | ICD-10-CM

## 2012-12-28 DIAGNOSIS — C50219 Malignant neoplasm of upper-inner quadrant of unspecified female breast: Secondary | ICD-10-CM

## 2012-12-28 LAB — COMPREHENSIVE METABOLIC PANEL (CC13)
ALT: 16 U/L (ref 0–55)
CO2: 24 mEq/L (ref 22–29)
Creatinine: 0.7 mg/dL (ref 0.6–1.1)
Glucose: 125 mg/dl — ABNORMAL HIGH (ref 70–99)
Total Bilirubin: 0.36 mg/dL (ref 0.20–1.20)

## 2012-12-28 LAB — CBC WITH DIFFERENTIAL/PLATELET
BASO%: 0.5 % (ref 0.0–2.0)
HCT: 37.9 % (ref 34.8–46.6)
LYMPH%: 28.3 % (ref 14.0–49.7)
MCHC: 34.5 g/dL (ref 31.5–36.0)
MONO#: 0.5 10*3/uL (ref 0.1–0.9)
NEUT%: 58.8 % (ref 38.4–76.8)
Platelets: 240 10*3/uL (ref 145–400)
WBC: 5.6 10*3/uL (ref 3.9–10.3)

## 2012-12-28 MED ORDER — FULVESTRANT 250 MG/5ML IM SOLN
500.0000 mg | Freq: Once | INTRAMUSCULAR | Status: AC
  Administered 2012-12-28: 500 mg via INTRAMUSCULAR
  Filled 2012-12-28: qty 10

## 2012-12-28 NOTE — Progress Notes (Signed)
Per amy horton rn., dr Darnelle Catalan state pt will not give zometa today.   dmr

## 2012-12-28 NOTE — Progress Notes (Signed)
ID: Jamie [name]   DOB: Nov 07, 1935  MR#: [id number]  WUJ#:[id number]  PCP: Gwen Pounds, MD GYN: Richardean Chimera SU: OTHER MD: Jeananne Rama, Lina Sar  HISTORY OF PRESENT ILLNESS: The patient's cancer was uncovered by an unusually circuitous route. She presented with stomach upset.  Because of a history of colitis, Dr. Dorena Cookey who happened to be on call for Dr. Matthias Hughs, obtained CT of the abdomen and pelvis on August 30, 2005.  As far as the abdomen and pelvis were concerned, there was no evidence of colitis but there were multiple scattered sclerotic lesions in the bone suggesting possible sclerotic metastatic disease (versus osteopoikilosis.)  Accordingly a bone scan was obtained.  This was done on September 01, 2005, and found tiny sclerotic lesions, and in particular a lesion of concern in the sternum.  Accordingly a CT scan of the chest was obtained.  This found the sternal problem to correspond with degenerative changes. However, it also showed a soft tissue nodule in the left breast.  The patient then had mammograms on September 27, 2005.  This found two areas of spiculation and architectural distortion in the upper outer quadrant of the left breast.  By ultrasound these were hyperechoic and irregular, highly suspicious for carcinoma.  Biopsy was obtained the same day and showed [id number], as well as [phone]) invasive lobular carcinoma, with both lesions biopsied being ER and PR positive, both herceptest positive, both negative by FISH.  Accordingly the patient was referred to Dr. Corliss Skains and breast MRI was obtained October 06, 2006.  No abnormal enhancement was noted in the right breast.  There was enhancing nodularity in the upper portion of the left breast where several discrete nodules were seen.  Accordingly Dr. Corliss Skains performed a left modified radical mastectomy on November 02, 2005.  The patient had a positive sentinel lymph node and Dr. Corliss Skains performed a left axillary lymph  node dissection.  However, only three additional lymph nodes were recovered, two of which also showed metastatic involvement (all had extra capsular extension.)  The patient's subsequent history is as detailed below  INTERVAL HISTORY: [name] returns today for routine six-month followup of her left breast carcinoma. She continues to receive Faslodex monthly, and is due for her next injection today. She is also on yearly zoledronic acid.   REVIEW OF SYSTEMS: She has no problems with a fulvestrant other than the discomfort of the injections themselves. Last year ago she was "in bed a week" after the zoledronic acid. She would prefer to receive it a week from today to "do both at the same time". Aside from this what is really bothering her is her fibromyalgia. She has left knee pain, low back pain, bilateral shoulder pain, right leg pain, and pain in both hands. Of course she is also status post bilateral carpal tunnel. She has seen Dr. Titus Dubin regarding this and is thinking of going back there to see if she can help. She has some sinus issues, problems with anxiety and depression, and she has a "skin problem" in her perineum, which she says in away dates back to her episiotomy decades ago, but now has changed. She tells me they are planning to remove it the second week in April and that "it may be cancer". I have asked her to make sure I get a copy of the results and she will call me if there are any concerns.  Otherwise a detailed review of systems today was noncontributory.   PAST MEDICAL HISTORY:  Past Medical History  Diagnosis Date  . Anxiety   . Asthma   . Breast cancer     metastasized  . Depression   . Diabetes mellitus   . GERD (gastroesophageal reflux disease)   . Hyperlipidemia   . Internal hemorrhoids   . Hx of adenomatous colonic polyps   . IBS (irritable bowel syndrome)   . Mitral valve prolapse   . Ischemic colitis   . Fibromyalgia   Significant for diabetes,  hypercholesterolemia, asthma, mitral valve prolapse requiring antibiotic prophylaxis before any invasive procedures, Meniere's disease, gastroesophageal reflux disease, osteoarthritis, degenerative disk disease, history of fibromyalgia, panic attacks, history of synovial cyst removal, history of total hysterectomy with bilateral salpingo-oophorectomy, history of septoplasty x2, status post appendectomy, status post dilatation and curettage remotely, status post bilateral carpal tunnel release under Dr. Rosanne Ashing [place].  PAST SURGICAL HISTORY: Past Surgical History  Procedure Laterality Date  . Mastectomy      left  and then treated with chemo and radiation  . Total abdominal hysterectomy    . Back surgery      synovial cyst removed from spine  . Carpal tunnel release      bilateral  . Dilation and curettage of uterus    . Appendectomy      FAMILY HISTORY The patient's father died at the age of 29 from a stroke.  The patient's mother died at the age of 62, also from a stroke.  The patient has three brothers; one died with "liver disease."  One has prostate cancer.  The other brother has prostate and colon cancer.  The only breast cancer in the family was the patient's mother's mother and she does not know how old her grandmother was when she was diagnosed.  There is no history of ovarian cancer in the family.  GYNECOLOGIC HISTORY: She is GXP2.  She had her hysterectomy while still menstruating and she took hormone replacement until December 2006, when she had her breast biopsy.  SOCIAL HISTORY: She used to work as a Lawyer.  Her husband [name] (who is the son of my former patient Hessie Orea) is semiretired, working for a Financial risk analyst.  What he likes to do is hunt and fish.  Their son Iantha Fallen had significant muscular dystrophy and died in 09-Mar-2007. Their daughter Marylu Lund, 30 years old, lives in [place] and works in an office.  The patient has two granddaughters, both RNs, one working at [employer]  and the other in [place].    ADVANCED DIRECTIVES:  HEALTH MAINTENANCE: History  Substance Use Topics  . Smoking status: Never Smoker   . Smokeless tobacco: Never Used  . Alcohol Use: No     Colonoscopy:  PAP:  Bone density:  Lipid panel:  Allergies  Allergen Reactions  . Niacin   . Promethazine Hcl     Current Outpatient Prescriptions  Medication Sig Dispense Refill  . Alum Hydroxide-Mag Carbonate (GAVISCON PO) Take 1 capsule by mouth daily.      . beclomethasone (QVAR) 40 MCG/ACT inhaler Inhale 2 puffs into the lungs at bedtime.      Marland Kitchen BYSTOLIC 5 MG tablet Take 1 tablet by mouth Daily.      . clonazePAM (KLONOPIN) 0.5 MG tablet Take 0.5 mg by mouth 2 (two) times daily.        . CRESTOR 5 MG tablet Take 1 tablet by mouth Daily.      . fluconazole (DIFLUCAN) 150 MG tablet as needed.       Marland Kitchen  fulvestrant (FASLODEX) 250 MG/5ML injection Inject 250 mg into the muscle every 30 (thirty) days. One injection each buttock over 1-2 minutes. Warm prior to use.      Marland Kitchen glimepiride (AMARYL) 1 MG tablet Take 0.5 tablets by mouth Daily.       Marland Kitchen HYDROCHLOROTHIAZIDE PO Take 75 mg by mouth as needed. swelling       . meclizine (ANTIVERT) 25 MG tablet Take 1 tablet by mouth as needed. dizziness      . metFORMIN (GLUCOPHAGE) 500 MG tablet Take 1 tablet by mouth Daily.      Marland Kitchen NASONEX 50 MCG/ACT nasal spray 1 spray by Nasal route at bedtime.      Marland Kitchen NEXIUM 40 MG capsule Take 1 tablet by mouth Daily.      Marland Kitchen nystatin (MYCOSTATIN) 100000 UNIT/ML suspension       . ondansetron (ZOFRAN) 4 MG tablet Take 1 tab twice daily as needed for nausea.  40 tablet  2  . ONGLYZA 5 MG TABS tablet Take 5 mg by mouth daily.       . polyethylene glycol powder (GLYCOLAX/MIRALAX) powder Take 17 g by mouth daily.      Marland Kitchen PROAIR HFA 108 (90 BASE) MCG/ACT inhaler       . sucralfate (CARAFATE) 1 G tablet Take 1 tablet (1 g total) by mouth 4 (four) times daily.  120 tablet  1   No current facility-administered medications  for this visit.    OBJECTIVE: Middle-aged white woman who appears well There were no vitals filed for this visit.   There is no weight on file to calculate BMI.    ECOG FS: 1 There were no vitals filed for this visit. Sclerae unicteric Oropharynx clear No peripheral adenopathy Lungs no rales or rhonchi Heart regular rate and rhythm Abd soft, nontender, with positive bowel sounds MSK no focal spinal tenderness, no peripheral edema; moderate arthritic changes noted over the hands bilaterally Neuro: nonfocal, well oriented, positive affect Breasts: the right breast is unremarkable; the left breast is status post mastectomy. There is no evidence of local recurrence. Telangiectasias as previously noted. Axillae are benign bilaterally   LAB RESULTS: Lab Results  Component Value Date   WBC 5.6 12/28/2012   NEUTROABS 3.3 12/28/2012   HGB 13.1 12/28/2012   HCT 37.9 12/28/2012   MCV 93.9 12/28/2012   PLT 240 12/28/2012      Chemistry      Component Value Date/Time   NA 140 12/28/2012 0955   NA 139 08/23/2012 1149   K 3.7 12/28/2012 0955   K 3.8 08/23/2012 1149   CL 104 12/28/2012 0955   CL 104 08/23/2012 1149   CO2 24 12/28/2012 0955   CO2 27 08/23/2012 1149   BUN 13.4 12/28/2012 0955   BUN 13 08/23/2012 1149   CREATININE 0.7 12/28/2012 0955   CREATININE 0.6 08/23/2012 1149      Component Value Date/Time   CALCIUM 9.7 12/28/2012 0955   CALCIUM 9.4 08/23/2012 1149   ALKPHOS 88 12/28/2012 0955   ALKPHOS 67 05/11/2012 0924   AST 17 12/28/2012 0955   AST 17 05/11/2012 0924   ALT 16 12/28/2012 0955   ALT 12 05/11/2012 0924   BILITOT 0.36 12/28/2012 0955   BILITOT 0.3 05/11/2012 0924       Lab Results  Component Value Date   LABCA2 57* 08/09/2012    STUDIES: CT ABDOMEN AND PELVIS WITH CONTRAST 08/24/2012 Technique: Multidetector CT imaging of the abdomen and pelvis was  performed following the standard protocol during bolus  administration of intravenous contrast.  Contrast: OMNIPAQUE  IOHEXOL 300 MG/ML SOLN  Comparison: 04/01/2011.  Findings: The lung bases are clear of acute findings. Minimal  right middle lobe and right lower lobe scarring changes. A left  mastectomy defect is noted. The heart is normal in size. The  esophagus is grossly normal.  The liver contour is slightly irregular but no worrisome hepatic  lesions or intrahepatic biliary dilatation to the gallbladder is  normal. No common bile duct dilatation. The pancreas is normal.  The spleen is normal in size. No focal lesions. The adrenal  glands and kidneys are unremarkable and stable. A right renal  calculus is again noted.  The stomach is not well distended with contrast but no gross  abnormalities are seen. The duodenum, small bowel and colon are  unremarkable. No inflammatory changes or mass lesions. There is  moderate stool throughout the colon. The appendix is normal. No  mesenteric or retroperitoneal mass or adenopathy. The aorta is  normal in caliber. The major branch vessels are patent. Small  scattered lymph nodes are noted. A probable calcified lymph node  is noted near the cecum.  The uterus is surgically absent. No pelvic mass or  lymphadenopathy. The bladder is normal. No inguinal mass or  adenopathy.  Stable diffuse sclerotic metastatic bone disease. No new lesions.  No pathologic fractures.  IMPRESSION:  1. Stable sclerotic osseous metastatic disease. No new lesions or  pathologic fractures.  2. No CT findings for intra-abdominal/pelvic metastatic disease.  3. No acute abdominal/pelvic findings.  4. Moderate stool throughout the colon could suggest constipation.  Original Report Authenticated By: Rudie Meyer, [name].  MAMMOGRAPHIC UNILATERAL RIGHT DIGITAL SCREENING WITH CAD 06/06/2012 Comparison: Previous exams.  Findings: There are scattered fibroglandular densities. No  suspicious masses, architectural distortion, or calcifications are  present.  Images were processed with CAD.   IMPRESSION:  No mammographic evidence of malignancy.  A result letter of this screening mammogram will be mailed directly  to the patient.  RECOMMENDATION:  Screening mammogram in one year. (Code:SM-B-01Y)    ASSESSMENT:76 y.o.  Mardene Sayer woman    (1) status post left modified radical mastectomy in January 2007 for a T1c N1, stage IIA  invasive lobular carcinoma, grade 1, strongly estrogen and progesterone receptor positive, HER-2 negative, with an MIB-1 of 7%.  (2)  Status post radiation given concurrently with Capecitabine.  Declined chemotherapy.    (3) Status post anastrozole and exemestane, both discontinued due to aches and pains.  Status post tamoxifen between October 2007 until August 2011.  Discontinued secondary to cramps   (4) multiple sclerotic bony lesions noted at initial diagnosis, biopsied x2 August 2009 without malignancy documented, on zoledronic acid annually starting 12/12/2007  (5) On fulvestrant since September 2011, with good tolerance  PLAN: She is doing terrific from my point of view, with no evidence of active disease. The plan is to continue the fulvestrant in definitely and the zoledronic acid yearly. I have asked her to take TUMS the day before the day of and the couple days after the zoledronic acid, which may help with her "flulike" symptoms. Otherwise she will make sure get a copy of the results of her upcoming surgery and she will call if she has any questions or concerns. She will see [place] again in 6 months. She knows to call for any problems that may develop before that visit.   MAGRINAT,GUSTAV C    12/28/2012

## 2013-01-03 ENCOUNTER — Telehealth: Payer: Self-pay | Admitting: *Deleted

## 2013-01-03 ENCOUNTER — Ambulatory Visit (HOSPITAL_BASED_OUTPATIENT_CLINIC_OR_DEPARTMENT_OTHER): Payer: Medicare Other

## 2013-01-03 VITALS — BP 163/64 | HR 73 | Temp 97.8°F | Resp 17

## 2013-01-03 MED ORDER — ZOLEDRONIC ACID 4 MG/5ML IV CONC: 4.0000 mg | Freq: Once | INTRAVENOUS | Status: DC

## 2013-01-03 MED ORDER — ZOLEDRONIC ACID 4 MG/100ML IV SOLN
4.0000 mg | Freq: Once | INTRAVENOUS | Status: AC
  Administered 2013-01-03: 4 mg via INTRAVENOUS
  Filled 2013-01-03: qty 100

## 2013-01-03 NOTE — Telephone Encounter (Signed)
Jamie [name] agreed to print [name] out an schedule for her upcoming appts.

## 2013-01-10 ENCOUNTER — Ambulatory Visit: Payer: Medicare Other | Admitting: Internal Medicine

## 2013-01-23 ENCOUNTER — Other Ambulatory Visit: Payer: Self-pay | Admitting: Obstetrics and Gynecology

## 2013-01-25 ENCOUNTER — Ambulatory Visit (HOSPITAL_BASED_OUTPATIENT_CLINIC_OR_DEPARTMENT_OTHER): Payer: Medicare Other

## 2013-01-25 ENCOUNTER — Other Ambulatory Visit (HOSPITAL_BASED_OUTPATIENT_CLINIC_OR_DEPARTMENT_OTHER): Payer: Medicare Other | Admitting: Lab

## 2013-01-25 VITALS — BP 156/67 | HR 71 | Temp 97.9°F

## 2013-01-25 DIAGNOSIS — Z853 Personal history of malignant neoplasm of breast: Secondary | ICD-10-CM

## 2013-01-25 DIAGNOSIS — Z5111 Encounter for antineoplastic chemotherapy: Secondary | ICD-10-CM

## 2013-01-25 DIAGNOSIS — C50219 Malignant neoplasm of upper-inner quadrant of unspecified female breast: Secondary | ICD-10-CM

## 2013-01-25 LAB — COMPREHENSIVE METABOLIC PANEL (CC13)
ALT: 15 U/L (ref 0–55)
AST: 21 U/L (ref 5–34)
Alkaline Phosphatase: 88 U/L (ref 40–150)
Sodium: 142 mEq/L (ref 136–145)
Total Bilirubin: 0.32 mg/dL (ref 0.20–1.20)
Total Protein: 7 g/dL (ref 6.4–8.3)

## 2013-01-25 LAB — CBC WITH DIFFERENTIAL/PLATELET
Basophils Absolute: 0 10*3/uL (ref 0.0–0.1)
Eosinophils Absolute: 0.1 10*3/uL (ref 0.0–0.5)
HCT: 36.8 % (ref 34.8–46.6)
HGB: 12.4 g/dL (ref 11.6–15.9)
LYMPH%: 25.5 % (ref 14.0–49.7)
MONO#: 0.5 10*3/uL (ref 0.1–0.9)
NEUT#: 4.2 10*3/uL (ref 1.5–6.5)
NEUT%: 63.9 % (ref 38.4–76.8)
Platelets: 248 10*3/uL (ref 145–400)
WBC: 6.6 10*3/uL (ref 3.9–10.3)

## 2013-01-25 MED ORDER — FULVESTRANT 250 MG/5ML IM SOLN
500.0000 mg | Freq: Once | INTRAMUSCULAR | Status: AC
  Administered 2013-01-25: 500 mg via INTRAMUSCULAR
  Filled 2013-01-25: qty 10

## 2013-01-25 NOTE — Patient Instructions (Signed)
CAll MD for problems

## 2013-01-26 ENCOUNTER — Telehealth: Payer: Self-pay | Admitting: *Deleted

## 2013-01-26 NOTE — Telephone Encounter (Signed)
Pt called wanting to schedule her labs and injection at an early time. gv d/t and also told her that i would mail it to her.

## 2013-02-22 ENCOUNTER — Other Ambulatory Visit: Payer: Medicare Other | Admitting: Lab

## 2013-02-22 ENCOUNTER — Ambulatory Visit: Payer: Medicare Other

## 2013-02-22 ENCOUNTER — Ambulatory Visit (HOSPITAL_BASED_OUTPATIENT_CLINIC_OR_DEPARTMENT_OTHER): Payer: Medicare Other

## 2013-02-22 ENCOUNTER — Other Ambulatory Visit (HOSPITAL_BASED_OUTPATIENT_CLINIC_OR_DEPARTMENT_OTHER): Payer: Medicare Other | Admitting: Lab

## 2013-02-22 VITALS — BP 141/63 | HR 68 | Temp 97.0°F

## 2013-02-22 DIAGNOSIS — Z853 Personal history of malignant neoplasm of breast: Secondary | ICD-10-CM

## 2013-02-22 DIAGNOSIS — C50219 Malignant neoplasm of upper-inner quadrant of unspecified female breast: Secondary | ICD-10-CM

## 2013-02-22 DIAGNOSIS — Z5111 Encounter for antineoplastic chemotherapy: Secondary | ICD-10-CM

## 2013-02-22 LAB — CBC WITH DIFFERENTIAL/PLATELET
Eosinophils Absolute: 0.2 10*3/uL (ref 0.0–0.5)
LYMPH%: 26.8 % (ref 14.0–49.7)
MCH: 32.2 pg (ref 25.1–34.0)
MCHC: 33.5 g/dL (ref 31.5–36.0)
MCV: 95.9 fL (ref 79.5–101.0)
MONO%: 8.3 % (ref 0.0–14.0)
Platelets: 257 10*3/uL (ref 145–400)
RBC: 3.95 10*6/uL (ref 3.70–5.45)

## 2013-02-22 LAB — COMPREHENSIVE METABOLIC PANEL (CC13)
Alkaline Phosphatase: 98 U/L (ref 40–150)
Glucose: 120 mg/dl — ABNORMAL HIGH (ref 70–99)
Sodium: 142 mEq/L (ref 136–145)
Total Bilirubin: 0.23 mg/dL (ref 0.20–1.20)
Total Protein: 7.3 g/dL (ref 6.4–8.3)

## 2013-02-22 MED ORDER — FULVESTRANT 250 MG/5ML IM SOLN
500.0000 mg | Freq: Once | INTRAMUSCULAR | Status: AC
  Administered 2013-02-22: 500 mg via INTRAMUSCULAR
  Filled 2013-02-22: qty 10

## 2013-02-22 NOTE — Patient Instructions (Signed)
Call MD for problems or concerns 

## 2013-03-05 ENCOUNTER — Other Ambulatory Visit: Payer: Self-pay | Admitting: *Deleted

## 2013-03-05 NOTE — Progress Notes (Signed)
Pt called to this RN to state concern and request per last injection.  Jessia states " my hip has hurt for the last 2 weeks since I had my shot and today is finally feeling better ", " I would like to have Liborio Nixon give me my shots - she has done it more then anyone and knows me well ".  Siona also wanted to state " and I don't like going back into that room with just a curtain- I feel like I am exposed and every question I am asked is being heard by everyone ".  This RN validated pt's concerns per above. Informed injections can be rescheduled to INJ room.  This RN sent POF to scheduling to reschedule noted appt in flush room for upcoming injections.

## 2013-03-07 ENCOUNTER — Telehealth: Payer: Self-pay | Admitting: *Deleted

## 2013-03-07 ENCOUNTER — Other Ambulatory Visit: Payer: Self-pay | Admitting: *Deleted

## 2013-03-07 ENCOUNTER — Ambulatory Visit (HOSPITAL_COMMUNITY)
Admission: RE | Admit: 2013-03-07 | Discharge: 2013-03-07 | Disposition: A | Discharge status: Home / Self Care | Payer: Medicare Other | Source: Ambulatory Visit | Attending: Oncology | Admitting: Oncology

## 2013-03-07 ENCOUNTER — Telehealth: Payer: Self-pay | Admitting: Oncology

## 2013-03-07 DIAGNOSIS — M79609 Pain in unspecified limb: Secondary | ICD-10-CM

## 2013-03-07 DIAGNOSIS — M1610 Unilateral primary osteoarthritis, unspecified hip: Secondary | ICD-10-CM | POA: Insufficient documentation

## 2013-03-07 DIAGNOSIS — C7951 Secondary malignant neoplasm of bone: Secondary | ICD-10-CM

## 2013-03-07 DIAGNOSIS — M169 Osteoarthritis of hip, unspecified: Secondary | ICD-10-CM | POA: Insufficient documentation

## 2013-03-07 DIAGNOSIS — M25559 Pain in unspecified hip: Secondary | ICD-10-CM | POA: Insufficient documentation

## 2013-03-07 DIAGNOSIS — C50919 Malignant neoplasm of unspecified site of unspecified female breast: Secondary | ICD-10-CM | POA: Insufficient documentation

## 2013-03-07 NOTE — Telephone Encounter (Signed)
Patient called asking for an xray of her hip.  Reports pain to right hip since receiving Faslodex on 02-22-2013.  Has taken tylenol, applied ice and heat.  Reports she can hardly walk and nausea due to the pain.  Will notify providers.

## 2013-03-07 NOTE — Progress Notes (Signed)
Called patient and instructed her to come in for xray of her right hip at [facility].

## 2013-03-08 ENCOUNTER — Other Ambulatory Visit: Payer: Self-pay | Admitting: *Deleted

## 2013-03-13 ENCOUNTER — Telehealth: Payer: Self-pay | Admitting: *Deleted

## 2013-03-13 NOTE — Telephone Encounter (Signed)
Pt called requesting that her appts be mailed to her. i placed letter/cal in the mail today. Pt is aware...td

## 2013-03-20 ENCOUNTER — Other Ambulatory Visit: Payer: Self-pay | Admitting: *Deleted

## 2013-03-20 ENCOUNTER — Other Ambulatory Visit: Payer: Medicare Other | Admitting: Lab

## 2013-03-20 ENCOUNTER — Ambulatory Visit: Payer: Medicare Other

## 2013-03-20 ENCOUNTER — Other Ambulatory Visit: Payer: Self-pay | Admitting: Oncology

## 2013-03-20 DIAGNOSIS — C50919 Malignant neoplasm of unspecified site of unspecified female breast: Secondary | ICD-10-CM

## 2013-03-21 ENCOUNTER — Telehealth: Payer: Self-pay | Admitting: Oncology

## 2013-03-22 ENCOUNTER — Other Ambulatory Visit: Payer: Medicare Other | Admitting: Lab

## 2013-03-22 ENCOUNTER — Ambulatory Visit: Payer: Medicare Other

## 2013-03-22 ENCOUNTER — Other Ambulatory Visit (HOSPITAL_BASED_OUTPATIENT_CLINIC_OR_DEPARTMENT_OTHER): Payer: Medicare Other | Admitting: Lab

## 2013-03-22 ENCOUNTER — Ambulatory Visit (HOSPITAL_BASED_OUTPATIENT_CLINIC_OR_DEPARTMENT_OTHER): Payer: Medicare Other

## 2013-03-22 VITALS — BP 151/60 | HR 68 | Temp 98.1°F

## 2013-03-22 DIAGNOSIS — Z853 Personal history of malignant neoplasm of breast: Secondary | ICD-10-CM

## 2013-03-22 DIAGNOSIS — C50219 Malignant neoplasm of upper-inner quadrant of unspecified female breast: Secondary | ICD-10-CM

## 2013-03-22 DIAGNOSIS — Z5111 Encounter for antineoplastic chemotherapy: Secondary | ICD-10-CM

## 2013-03-22 LAB — COMPREHENSIVE METABOLIC PANEL (CC13)
Albumin: 3.7 g/dL (ref 3.5–5.0)
BUN: 13.6 mg/dL (ref 7.0–26.0)
CO2: 24 mEq/L (ref 22–29)
Calcium: 9.7 mg/dL (ref 8.4–10.4)
Chloride: 103 mEq/L (ref 98–107)
Glucose: 149 mg/dl — ABNORMAL HIGH (ref 70–99)
Potassium: 3.8 mEq/L (ref 3.5–5.1)
Sodium: 139 mEq/L (ref 136–145)
Total Protein: 7 g/dL (ref 6.4–8.3)

## 2013-03-22 LAB — CBC WITH DIFFERENTIAL/PLATELET
Basophils Absolute: 0 10*3/uL (ref 0.0–0.1)
Eosinophils Absolute: 0.3 10*3/uL (ref 0.0–0.5)
HCT: 37.8 % (ref 34.8–46.6)
HGB: 12.7 g/dL (ref 11.6–15.9)
NEUT#: 3.5 10*3/uL (ref 1.5–6.5)
NEUT%: 60 % (ref 38.4–76.8)
RDW: 12.4 % (ref 11.2–14.5)
lymph#: 1.6 10*3/uL (ref 0.9–3.3)

## 2013-03-22 MED ORDER — FULVESTRANT 250 MG/5ML IM SOLN
500.0000 mg | Freq: Once | INTRAMUSCULAR | Status: AC
  Administered 2013-03-22: 500 mg via INTRAMUSCULAR
  Filled 2013-03-22: qty 10

## 2013-03-23 ENCOUNTER — Other Ambulatory Visit: Payer: Self-pay | Admitting: *Deleted

## 2013-03-24 ENCOUNTER — Other Ambulatory Visit: Payer: Medicare Other

## 2013-03-26 ENCOUNTER — Ambulatory Visit (HOSPITAL_COMMUNITY): Admission: RE | Admit: 2013-03-26 | Payer: Medicare Other | Source: Ambulatory Visit

## 2013-04-01 ENCOUNTER — Ambulatory Visit
Admission: RE | Admit: 2013-04-01 | Discharge: 2013-04-01 | Disposition: A | Discharge status: Home / Self Care | Payer: Medicare Other | Source: Ambulatory Visit | Attending: Oncology | Admitting: Oncology

## 2013-04-01 DIAGNOSIS — C50919 Malignant neoplasm of unspecified site of unspecified female breast: Secondary | ICD-10-CM

## 2013-04-01 MED ORDER — GADOBENATE DIMEGLUMINE 529 MG/ML IV SOLN
10.0000 mL | Freq: Once | INTRAVENOUS | Status: AC | PRN
  Administered 2013-04-01: 10 mL via INTRAVENOUS

## 2013-04-04 ENCOUNTER — Other Ambulatory Visit: Payer: Self-pay | Admitting: Oncology

## 2013-04-04 ENCOUNTER — Telehealth: Payer: Self-pay | Admitting: Oncology

## 2013-04-04 NOTE — Progress Notes (Signed)
I called Jamie [name] with the results of her MRI of the right hip. It is very difficult to know exactly why her right hip is hurting. She says is worse some days than others and that actually her right knee hurts more. She does not want to get the right knee further evaluated because she "is not to have any surgery there".  I suggested she meet with one of our radiation oncologists to consider radiation to the right hip, even though we have never been able to document cancer in her bones at the spike to prior biopsies. I have also added an appointment with my physician's assistant on July 3, which is her next treatment here, just to look at the right knee and see if there is anything there that we need to be more concerned about. That has a good understanding of this plan and is very much in agreement.

## 2013-04-11 NOTE — Progress Notes (Signed)
Histology and Location of Primary Cancer: :Metastatic Breast  To bone Sites of Visceral and Bony Metastatic Disease: =04/01/13: MR Right HIp Findings: Diffuse sclerotic osseous metastatic disease involving the pelvis and hips is again demonstrated. No pathologic fracture is identified. There are several small active/non sclerotic metastatic lesions involving the pelvis including the right iliac crest, the left supra acetabular region, the right intertrochanteric region and the left inferior pubic ramus. Several other smaller areas are also noted. No pathologicfractures are identified.  Location(s) of Symptomatic Metastases: Right Hip right knee   Past/Anticipated chemotherapy by medical oncology, if any: Faslodex monthly also on yearly zoledronic acid, per dt. Magrinat  Note 04/06/13 consider radiation to right hip  Pain on a scale of 0-10 is: 8 when trying to walk, sitting down hurts -5, 5 now   If Spine Met(s), symptoms, if any, include:  Bowel/Bladder retention or incontinence (please describe):IBS, takes imodium prn for diarrhea, sometimes constipation  Numbness or weakness in extremities (please describe): Right hip numbness 8 weeks since after injection of faslodex stated  Current Decadron regimen, if applicable:NO  Ambulatory status? Ambulatory  But some days at home wobbly says she needs to buy a cane  SAFETY ISSUES:  Prior radiation?yes Left breast , 12/07/05-01/20/06 left supraclavicular fossa& full axilla as well as left chest wall and upper internal mammary lymph nodes, 5040 cGy in 20-180 cGy fractions, Dr. Dorna Bloom  Pacemaker/ICD? No  NOPossible current pregnancy? no  Is the patient on methotrexate? no  Current Complaints / other details:GXP2, HRT after hysterectomy till 09/2005 with breast bx   Anxiety, depression,,panic attacks hx, Meniere's disease,  asthma, hx adenomatous polyps,Diabetes Mellitus, Gerd, mitral valve prolapse, IBS, Ischemic colitis, Fibromyalgia Total abdominal  hysterectomy B/L salpingo-opphorectomy, , D&C,Back surgery,snyovial cyst removed from spine, DDD,hx septoplasty x2,appy, b/l carpal tunnel release Father died of a stroke age 42, mother died age 44 also stroke, 3 brothers, 1 died liver disease, 1 brother with prostate cancer, 3rd brother prostate and colon cancer, maternal grandmother breast cancer, no history of ovarian cancer in the family,

## 2013-04-12 ENCOUNTER — Ambulatory Visit
Admission: RE | Admit: 2013-04-12 | Discharge: 2013-04-12 | Disposition: A | Discharge status: Home / Self Care | Payer: Medicare Other | Source: Ambulatory Visit | Attending: Radiation Oncology | Admitting: Radiation Oncology

## 2013-04-12 ENCOUNTER — Encounter: Payer: Self-pay | Admitting: Radiation Oncology

## 2013-04-12 VITALS — BP 134/62 | HR 68 | Temp 98.4°F | Resp 20 | Ht 64.0 in | Wt 153.8 lb

## 2013-04-12 DIAGNOSIS — Z90710 Acquired absence of both cervix and uterus: Secondary | ICD-10-CM | POA: Insufficient documentation

## 2013-04-12 DIAGNOSIS — C50919 Malignant neoplasm of unspecified site of unspecified female breast: Secondary | ICD-10-CM | POA: Insufficient documentation

## 2013-04-12 DIAGNOSIS — K219 Gastro-esophageal reflux disease without esophagitis: Secondary | ICD-10-CM | POA: Insufficient documentation

## 2013-04-12 DIAGNOSIS — E785 Hyperlipidemia, unspecified: Secondary | ICD-10-CM | POA: Insufficient documentation

## 2013-04-12 DIAGNOSIS — C50911 Malignant neoplasm of unspecified site of right female breast: Secondary | ICD-10-CM

## 2013-04-12 DIAGNOSIS — Z9089 Acquired absence of other organs: Secondary | ICD-10-CM | POA: Insufficient documentation

## 2013-04-12 DIAGNOSIS — Z79899 Other long term (current) drug therapy: Secondary | ICD-10-CM | POA: Insufficient documentation

## 2013-04-12 DIAGNOSIS — C7951 Secondary malignant neoplasm of bone: Secondary | ICD-10-CM | POA: Insufficient documentation

## 2013-04-12 DIAGNOSIS — Z923 Personal history of irradiation: Secondary | ICD-10-CM | POA: Insufficient documentation

## 2013-04-12 DIAGNOSIS — Z9010 Acquired absence of unspecified breast and nipple: Secondary | ICD-10-CM | POA: Insufficient documentation

## 2013-04-12 DIAGNOSIS — E119 Type 2 diabetes mellitus without complications: Secondary | ICD-10-CM | POA: Insufficient documentation

## 2013-04-12 HISTORY — DX: Unspecified cataract: H26.9

## 2013-04-12 NOTE — Progress Notes (Signed)
Please see the Nurse Progress Note in the MD Initial Consult Encounter for this patient. 

## 2013-04-12 NOTE — Progress Notes (Signed)
Radiation Oncology         [phone]) [phone] ________________________________  Initial outpatient Consultation - Date: 04/12/2013   Name: Jamie [name] MRN: [id number]   DOB: Nov 17, 1935  REFERRING PHYSICIAN: Magrinat, Valentino Hue, MD  DIAGNOSIS: Bone metastases presumably from breast cancer (bone biopsy x 2 has been negative)  HISTORY OF PRESENT ILLNESS::Jamie [name] is a 77 y.o. female  who underwent a left mastectomy followed by postmastectomy radiation and may of 2007. She received 50.4 gray to the left supraclavicular fossa, axilla, chest wall and internal mammary lymph nodes followed by a boost to the mastectomy scar to a total dose of 60.4 gray. She received to the Xeloda with radiation and declined systemic chemotherapy. She was treated with tamoxifen until August 2011. She presented with multiple sclerotic bony lesions biopsied in August 2009 which were negative. She was started on Zometa in February of 2009 and has been on fulvestrant since 2011. She was seen by Dr. Darnelle Catalan and was complaining of increasing right hip pain. Despite the negative biopsy as her imaging as always been concerning for metastatic disease. An MRI of the right hip on 04/01/2013 showed several non-sclerotic metastatic lesions throughout the pelvis including the right iliac crest left supra-acetabular region right intertrochanteric region and left inferior pubic ramus. A CT of the chest and abdomen and pelvis on 08/24/2012 shows multiple areas of metastatic disease within the bones including down the sacrum and lumbar spine. Also shows disease within the right acetabulum. The patient describes the pain is worse when she is moving around her house. She has not taken anything for this. She has noticed that her buttock has been numbness since her last injection. She is also able to feel the spots where they injected her for a longer period of time.  PREVIOUS RADIATION THERAPY: Yes as described above to the left chest wall  supraclavicular fossa axilla and internal mammary lymph nodes.  PAST MEDICAL HISTORY:  has a past medical history of Anxiety; Asthma; Breast cancer; Depression; Diabetes mellitus; GERD (gastroesophageal reflux disease); Hyperlipidemia; Internal hemorrhoids; adenomatous colonic polyps; IBS (irritable bowel syndrome); Mitral valve prolapse; Ischemic colitis; Fibromyalgia; Allergy; and Cataract.    PAST SURGICAL HISTORY: Past Surgical History  Procedure Laterality Date  . Mastectomy      left  and then treated with chemo and radiation  . Total abdominal hysterectomy    . Back surgery      synovial cyst removed from spine  . Carpal tunnel release      bilateral  . Dilation and curettage of uterus    . Appendectomy    . Cataract surgery Bilateral     FAMILY HISTORY:  Family History  Problem Relation Age of Onset  . Prostate cancer Brother     x2  . Irritable bowel syndrome Mother   . Colon cancer Neg Hx   . Stroke Father     SOCIAL HISTORY:  History  Substance Use Topics  . Smoking status: Never Smoker   . Smokeless tobacco: Never Used  . Alcohol Use: No    ALLERGIES: Niacin and Promethazine hcl  MEDICATIONS:  Current Outpatient Prescriptions  Medication Sig Dispense Refill  . acetaminophen (TYLENOL) 500 MG tablet Take 500 mg by mouth 2 (two) times daily.      . beclomethasone (QVAR) 40 MCG/ACT inhaler Inhale 2 puffs into the lungs at bedtime.      Marland Kitchen BYSTOLIC 5 MG tablet Take 1 tablet by mouth Daily.      Marland Kitchen  Cholecalciferol (D3 ADULT) 1000 UNITS CHEW Chew 1,000 Units by mouth daily.      . clonazePAM (KLONOPIN) 0.5 MG tablet Take 0.5 mg by mouth 2 (two) times daily.        . CRESTOR 5 MG tablet Take 1 tablet by mouth Daily.      . fulvestrant (FASLODEX) 250 MG/5ML injection Inject 250 mg into the muscle every 30 (thirty) days. One injection each buttock over 1-2 minutes. Warm prior to use.      Marland Kitchen glimepiride (AMARYL) 1 MG tablet Take 0.5 tablets by mouth Daily.       Marland Kitchen  HYDROCHLOROTHIAZIDE PO Take 75 mg by mouth as needed. swelling       . metFORMIN (GLUCOPHAGE) 500 MG tablet Take 1 tablet by mouth Daily.      Marland Kitchen NEXIUM 40 MG capsule Take 1 tablet by mouth Daily.      . ONGLYZA 5 MG TABS tablet Take 5 mg by mouth daily.       . polyethylene glycol powder (GLYCOLAX/MIRALAX) powder Take 17 g by mouth daily.      Marland Kitchen PROAIR HFA 108 (90 BASE) MCG/ACT inhaler       . Alum Hydroxide-Mag Carbonate (GAVISCON PO) Take 1 capsule by mouth daily.      . fluconazole (DIFLUCAN) 150 MG tablet as needed.       . meclizine (ANTIVERT) 25 MG tablet Take 1 tablet by mouth as needed. dizziness      . NASONEX 50 MCG/ACT nasal spray 1 spray by Nasal route at bedtime.      Marland Kitchen nystatin (MYCOSTATIN) 100000 UNIT/ML suspension       . ondansetron (ZOFRAN) 4 MG tablet Take 1 tab twice daily as needed for nausea.  40 tablet  2  . sucralfate (CARAFATE) 1 G tablet Take 1 tablet (1 g total) by mouth 4 (four) times daily.  120 tablet  1   No current facility-administered medications for this encounter.     REVIEW OF SYSTEMS:  A 15 point review of systems is documented in the electronic medical record. This was obtained by the nursing staff. However, I reviewed this with the patient to discuss relevant findings and make appropriate changes.  She denies any focal weakness of that right leg. She states she feels unsteady on her feet and is often times running into her husband when he walks next to her. Her left knee has begun to bother her which she believes is because she shifting all of her weight away from her right hip.   PHYSICAL EXAM:  Filed Vitals:   04/12/13 0951  BP: 134/62  Pulse: 68  Temp: 98.4 F (36.9 C)  Resp: 20  .153 lb 12.8 oz (69.763 kg). She is a pleasant female sitting currently on examining table. She has 5 out of 5 strength bilaterally. She is alert minus x3. She is status post left mastectomy. She has tenderness to palpation over her bilateral buttock and her  sacrum.  LABORATORY DATA:  Lab Results  Component Value Date   WBC 5.8 03/22/2013   HGB 12.7 03/22/2013   HCT 37.8 03/22/2013   MCV 95.0 03/22/2013   PLT 255 03/22/2013   Lab Results  Component Value Date   NA 139 03/22/2013   K 3.8 03/22/2013   CL 103 03/22/2013   CO2 24 03/22/2013   Lab Results  Component Value Date   ALT 13 03/22/2013   AST 18 03/22/2013   ALKPHOS 84 03/22/2013  BILITOT 0.32 03/22/2013     RADIOGRAPHY: Mr Hip Right W Wo Contrast  04/02/2013   *RADIOLOGY REPORT*  Clinical Data: Metastatic breast cancer.  Right hip pain.  MRI OF THE RIGHT HIP WITHOUT AND WITH CONTRAST  Technique:  Multiplanar, multisequence MR imaging was performed both before and after administration of intravenous contrast.  Contrast: 10mL MULTIHANCE GADOBENATE DIMEGLUMINE 529 MG/ML IV SOLN  Comparison: CT scan 08/24/2012.  Findings: Diffuse sclerotic osseous metastatic disease involving the pelvis and hips is again demonstrated.  No pathologic fracture is identified. There are several small active/non sclerotic metastatic lesions involving the pelvis including the right iliac crest, the left supra acetabular region, the right intertrochanteric region and the left inferior pubic ramus. Several other smaller areas are also noted.  No pathologic fractures are identified.  The surrounding hip and pelvic loss fissure is normal except for mild gluteus medius tendonitis bilaterally.  Pubic symphysis and SI joints are intact.  No hip joint effusion.  No significant intrapelvic abnormalities.  No inguinal mass or adenopathy.  IMPRESSION:  1.  Diffuse sclerotic osseous metastatic disease. 2.  Numerous small active, enhancing non sclerotic lesions. 3.  No pathologic fractures identified.   Original Report Authenticated By: Rudie Meyer, [name].      IMPRESSION: Presumed metastatic breast cancer to the lumbosacral spine and right hip  PLAN: I discussed treatment with her today. We discussed treatment of her lower lumbar spine as well as  her sacrum. As she does describe this pain as starting started in the middle of her low back in radiating down her posterior buttock and into her thigh. That's very typical for sacral pain. We discussed 10 treatments of an outpatient. We discussed the process of simulation the placement tattoos. We discussed use of alpha cradle for hip immobilization purposes. She is interested in proceeding forward. I talked about the side effects including nausea vomiting diarrhea and damage to any of the structures within her pelvis will be treated. I think it would be good for to go ahead and treat her now before any other joints are affected.  I spent 40 minutes  face to face with the patient and more than 50% of that time was spent in counseling and/or coordination of care.   ------------------------------------------------  Lurline Hare, MD

## 2013-04-17 ENCOUNTER — Ambulatory Visit
Admission: RE | Admit: 2013-04-17 | Discharge: 2013-04-17 | Disposition: A | Discharge status: Home / Self Care | Payer: Medicare Other | Source: Ambulatory Visit | Attending: Radiation Oncology | Admitting: Radiation Oncology

## 2013-04-17 DIAGNOSIS — K3189 Other diseases of stomach and duodenum: Secondary | ICD-10-CM | POA: Insufficient documentation

## 2013-04-17 DIAGNOSIS — R197 Diarrhea, unspecified: Secondary | ICD-10-CM | POA: Insufficient documentation

## 2013-04-17 DIAGNOSIS — C7951 Secondary malignant neoplasm of bone: Secondary | ICD-10-CM | POA: Insufficient documentation

## 2013-04-17 DIAGNOSIS — M25559 Pain in unspecified hip: Secondary | ICD-10-CM | POA: Insufficient documentation

## 2013-04-17 DIAGNOSIS — C50919 Malignant neoplasm of unspecified site of unspecified female breast: Secondary | ICD-10-CM | POA: Insufficient documentation

## 2013-04-17 DIAGNOSIS — Z510 Encounter for antineoplastic radiation therapy: Secondary | ICD-10-CM | POA: Insufficient documentation

## 2013-04-17 NOTE — Progress Notes (Signed)
Name: MEEAH TOTINO   MRN: [id number]  Date:  04/17/2013  DOB: [birth year]  Status:outpatient    DIAGNOSIS: Metastatic breast cancer to bone  CONSENT VERIFIED: yes   SET UP: Patient is setup supine   IMMOBILIZATION:  The following immobilization was used: Alpha cradle  NARRATIVE:  Pt Searls was brought to the CT Simulation planning suite.  Identity was confirmed.  All relevant records and images related to the planned course of therapy were reviewed.  Then, the patient was positioned in a stable reproducible clinical set-up for radiation therapy.  CT images were obtained.  An isocenter was placed. Skin markings were placed.  The CT images were loaded into the planning software where the target and avoidance structures were contoured.  The radiation prescription was entered and confirmed. The patient was discharged in stable condition and tolerated simulation well.    TREATMENT PLANNING NOTE:  Treatment planning then occurred. I have requested : MLC's, isodose plan, basic dose calculation  I have requested 3 dimensional simulation with DVH of cord, kidneys, bowel and GTV

## 2013-04-19 ENCOUNTER — Ambulatory Visit (HOSPITAL_COMMUNITY)
Admission: RE | Admit: 2013-04-19 | Discharge: 2013-04-19 | Disposition: A | Discharge status: Home / Self Care | Payer: Medicare Other | Source: Ambulatory Visit | Attending: Physician Assistant | Admitting: Physician Assistant

## 2013-04-19 ENCOUNTER — Telehealth: Payer: Self-pay | Admitting: Oncology

## 2013-04-19 ENCOUNTER — Other Ambulatory Visit (HOSPITAL_BASED_OUTPATIENT_CLINIC_OR_DEPARTMENT_OTHER): Payer: Medicare Other | Admitting: Lab

## 2013-04-19 ENCOUNTER — Ambulatory Visit: Payer: Medicare Other

## 2013-04-19 ENCOUNTER — Ambulatory Visit (HOSPITAL_BASED_OUTPATIENT_CLINIC_OR_DEPARTMENT_OTHER): Payer: Medicare Other | Admitting: Physician Assistant

## 2013-04-19 ENCOUNTER — Other Ambulatory Visit: Payer: Medicare Other | Admitting: Lab

## 2013-04-19 ENCOUNTER — Ambulatory Visit (HOSPITAL_BASED_OUTPATIENT_CLINIC_OR_DEPARTMENT_OTHER): Payer: Medicare Other

## 2013-04-19 ENCOUNTER — Encounter: Payer: Self-pay | Admitting: Physician Assistant

## 2013-04-19 VITALS — BP 151/53 | HR 66 | Temp 97.9°F

## 2013-04-19 VITALS — BP 126/68 | HR 73 | Temp 97.6°F | Resp 20 | Ht 64.0 in | Wt 154.5 lb

## 2013-04-19 DIAGNOSIS — M25569 Pain in unspecified knee: Secondary | ICD-10-CM

## 2013-04-19 DIAGNOSIS — C50419 Malignant neoplasm of upper-outer quadrant of unspecified female breast: Secondary | ICD-10-CM

## 2013-04-19 DIAGNOSIS — M25562 Pain in left knee: Secondary | ICD-10-CM

## 2013-04-19 DIAGNOSIS — C7951 Secondary malignant neoplasm of bone: Secondary | ICD-10-CM

## 2013-04-19 DIAGNOSIS — Z853 Personal history of malignant neoplasm of breast: Secondary | ICD-10-CM

## 2013-04-19 DIAGNOSIS — M25469 Effusion, unspecified knee: Secondary | ICD-10-CM | POA: Insufficient documentation

## 2013-04-19 DIAGNOSIS — C7952 Secondary malignant neoplasm of bone marrow: Secondary | ICD-10-CM

## 2013-04-19 DIAGNOSIS — L989 Disorder of the skin and subcutaneous tissue, unspecified: Secondary | ICD-10-CM

## 2013-04-19 DIAGNOSIS — Z5111 Encounter for antineoplastic chemotherapy: Secondary | ICD-10-CM

## 2013-04-19 DIAGNOSIS — R232 Flushing: Secondary | ICD-10-CM

## 2013-04-19 LAB — COMPREHENSIVE METABOLIC PANEL (CC13)
Albumin: 3.7 g/dL (ref 3.5–5.0)
BUN: 11.5 mg/dL (ref 7.0–26.0)
CO2: 29 mEq/L (ref 22–29)
Calcium: 9.8 mg/dL (ref 8.4–10.4)
Chloride: 104 mEq/L (ref 98–109)
Creatinine: 0.8 mg/dL (ref 0.6–1.1)
Glucose: 129 mg/dl (ref 70–140)
Potassium: 3.8 mEq/L (ref 3.5–5.1)

## 2013-04-19 LAB — CBC WITH DIFFERENTIAL/PLATELET
Eosinophils Absolute: 0.3 10*3/uL (ref 0.0–0.5)
MCV: 94.5 fL (ref 79.5–101.0)
MONO#: 0.6 10*3/uL (ref 0.1–0.9)
MONO%: 9.3 % (ref 0.0–14.0)
NEUT#: 3.7 10*3/uL (ref 1.5–6.5)
RBC: 4.03 10*6/uL (ref 3.70–5.45)
RDW: 12.7 % (ref 11.2–14.5)
WBC: 6.4 10*3/uL (ref 3.9–10.3)
lymph#: 1.8 10*3/uL (ref 0.9–3.3)

## 2013-04-19 MED ORDER — GABAPENTIN 100 MG PO CAPS: 300.0000 mg | ORAL_CAPSULE | Freq: Every day | ORAL | Status: DC

## 2013-04-19 MED ORDER — FULVESTRANT 250 MG/5ML IM SOLN
500.0000 mg | Freq: Once | INTRAMUSCULAR | Status: AC
  Administered 2013-04-19: 500 mg via INTRAMUSCULAR
  Filled 2013-04-19: qty 10

## 2013-04-19 NOTE — Progress Notes (Signed)
ID: Jamie [name]   DOB: 01/20/1936  MR#: [id number]  GEX#:[id number]  PCP: Gwen Pounds, MD GYN: Richardean Chimera SU: OTHER MD: Jeananne Rama, Lina Sar  HISTORY OF PRESENT ILLNESS: The patient's cancer was uncovered by an unusually circuitous route. She presented with stomach upset.  Because of a history of colitis, Dr. Dorena Cookey who happened to be on call for Dr. Matthias Hughs, obtained CT of the abdomen and pelvis on August 30, 2005.  As far as the abdomen and pelvis were concerned, there was no evidence of colitis but there were multiple scattered sclerotic lesions in the bone suggesting possible sclerotic metastatic disease (versus osteopoikilosis.)  Accordingly a bone scan was obtained.  This was done on September 01, 2005, and found tiny sclerotic lesions, and in particular a lesion of concern in the sternum.  Accordingly a CT scan of the chest was obtained.  This found the sternal problem to correspond with degenerative changes. However, it also showed a soft tissue nodule in the left breast.  The patient then had mammograms on September 27, 2005.  This found two areas of spiculation and architectural distortion in the upper outer quadrant of the left breast.  By ultrasound these were hyperechoic and irregular, highly suspicious for carcinoma.  Biopsy was obtained the same day and showed ([id number], as well as [phone]) invasive lobular carcinoma, with both lesions biopsied being ER and PR positive, both herceptest positive, both negative by FISH.  Accordingly the patient was referred to Dr. Corliss Skains and breast MRI was obtained October 06, 2006.  No abnormal enhancement was noted in the right breast.  There was enhancing nodularity in the upper portion of the left breast where several discrete nodules were seen.  Accordingly Dr. Corliss Skains performed a left modified radical mastectomy on November 02, 2005.  The patient had a positive sentinel lymph node and Dr. Corliss Skains performed a left axillary lymph  node dissection.  However, only three additional lymph nodes were recovered, two of which also showed metastatic involvement (all had extra capsular extension.)  The patient's subsequent history is as detailed below  INTERVAL HISTORY: Jamie [name] returns today for  followup of her metastatic breast carcinoma. She continues to receive Faslodex monthly, and is due for her next injection today. She is also on yearly zoledronic acid, last given in March 2014.  She recently complained of right hip pain, and subsequently an MRI was obtained of the right hip. She has met with Dr. Michell Heinrich, and is ready to initiate radiation to the lumbar sacral spine and right hip to control pain.  Jamie [name] also reports continued pain in the left knee. She denies any injuries to the area. She "absolutely does not want surgery", but agrees to a plain film x-ray of the knee, and a referral to an orthopedic doctor for further evaluation and consideration of a cortisone injection if appropriate.   REVIEW OF SYSTEMS: Jamie [name] has had no recent illnesses and denies fevers or chills. She continues to have hot flashes at night. She has difficulty sleeping. She's had no rashes. She does have a "black head" that has been on her upper back for "months" but she wanted me to look at today. She tells me it itches, feels irritated, and bothers her. She's eating and drinking well. She's had no nausea and no change in bowel habits. She denies any signs of abnormal bleeding. She's had no new cough or increased shortness of breath and denies any chest pain. She's had no abnormal headaches or  dizziness.  A detailed review of systems is otherwise noncontributory.    PAST MEDICAL HISTORY: Past Medical History  Diagnosis Date  . Anxiety   . Asthma   . Breast cancer     metastasized  . Depression   . Diabetes mellitus   . GERD (gastroesophageal reflux disease)   . Hyperlipidemia   . Internal hemorrhoids   . Hx of adenomatous colonic polyps    . IBS (irritable bowel syndrome)   . Mitral valve prolapse   . Ischemic colitis   . Fibromyalgia   . Allergy   . Cataract   Significant for diabetes, hypercholesterolemia, asthma, mitral valve prolapse requiring antibiotic prophylaxis before any invasive procedures, Meniere's disease, gastroesophageal reflux disease, osteoarthritis, degenerative disk disease, history of fibromyalgia, panic attacks, history of synovial cyst removal, history of total hysterectomy with bilateral salpingo-oophorectomy, history of septoplasty x2, status post appendectomy, status post dilatation and curettage remotely, status post bilateral carpal tunnel release under Dr. Rosanne Ashing [place].  PAST SURGICAL HISTORY: Past Surgical History  Procedure Laterality Date  . Mastectomy      left  and then treated with chemo and radiation  . Total abdominal hysterectomy    . Back surgery      synovial cyst removed from spine  . Carpal tunnel release      bilateral  . Dilation and curettage of uterus    . Appendectomy    . Cataract surgery Bilateral     FAMILY HISTORY The patient's father died at the age of 60 from a stroke.  The patient's mother died at the age of 16, also from a stroke.  The patient has three brothers; one died with "liver disease."  One has prostate cancer.  The other brother has prostate and colon cancer.  The only breast cancer in the family was the patient's mother's mother and she does not know how old her grandmother was when she was diagnosed.  There is no history of ovarian cancer in the family.  GYNECOLOGIC HISTORY: She is GXP2.  She had her hysterectomy while still menstruating and she took hormone replacement until December 2006, when she had her breast biopsy.  SOCIAL HISTORY: She used to work as a Lawyer.  Her husband [name] (who is the son of my former patient Hessie Romero) is semiretired, working for a Financial risk analyst.  What he likes to do is hunt and fish.  Their son Iantha Fallen had  significant muscular dystrophy and died in 03-17-2007. Their daughter Marylu Lund, 63 years old, lives in [place] and works in an office.  The patient has two granddaughters, both RNs, one working at [employer] and the other in [place].    ADVANCED DIRECTIVES:  HEALTH MAINTENANCE: History  Substance Use Topics  . Smoking status: Never Smoker   . Smokeless tobacco: Never Used  . Alcohol Use: No     Colonoscopy:  PAP:  Bone density:  Lipid panel:  Allergies  Allergen Reactions  . Niacin   . Promethazine Hcl     Current Outpatient Prescriptions  Medication Sig Dispense Refill  . acetaminophen (TYLENOL) 500 MG tablet Take 500 mg by mouth 2 (two) times daily.      . Alum Hydroxide-Mag Carbonate (GAVISCON PO) Take 1 capsule by mouth daily.      . beclomethasone (QVAR) 40 MCG/ACT inhaler Inhale 2 puffs into the lungs at bedtime.      . betamethasone valerate ointment (VALISONE) 0.1 %       . BYSTOLIC 5 MG tablet Take  1 tablet by mouth Daily.      . Cholecalciferol (D3 ADULT) 1000 UNITS CHEW Chew 1,000 Units by mouth daily.      . clonazePAM (KLONOPIN) 0.5 MG tablet Take 0.5 mg by mouth 2 (two) times daily.        . CRESTOR 5 MG tablet Take 1 tablet by mouth Daily.      . fulvestrant (FASLODEX) 250 MG/5ML injection Inject 250 mg into the muscle every 30 (thirty) days. One injection each buttock over 1-2 minutes. Warm prior to use.      Marland Kitchen glimepiride (AMARYL) 1 MG tablet Take 0.5 tablets by mouth Daily.       Marland Kitchen HYDROCHLOROTHIAZIDE PO Take 75 mg by mouth as needed. swelling       . metFORMIN (GLUCOPHAGE) 500 MG tablet Take 1 tablet by mouth Daily.      Marland Kitchen NEXIUM 40 MG capsule Take 1 tablet by mouth Daily.      . ondansetron (ZOFRAN) 4 MG tablet Take 1 tab twice daily as needed for nausea.  40 tablet  2  . ONGLYZA 5 MG TABS tablet Take 5 mg by mouth daily.       . polyethylene glycol powder (GLYCOLAX/MIRALAX) powder Take 17 g by mouth daily.      Marland Kitchen PROAIR HFA 108 (90 BASE) MCG/ACT inhaler       .  fluconazole (DIFLUCAN) 150 MG tablet as needed.       . meclizine (ANTIVERT) 25 MG tablet Take 1 tablet by mouth as needed. dizziness      . NASONEX 50 MCG/ACT nasal spray 1 spray by Nasal route at bedtime.      Marland Kitchen nystatin (MYCOSTATIN) 100000 UNIT/ML suspension       . sucralfate (CARAFATE) 1 G tablet Take 1 tablet (1 g total) by mouth 4 (four) times daily.  120 tablet  1   No current facility-administered medications for this visit.    OBJECTIVE: Middle-aged white woman who appears well Filed Vitals:   04/19/13 1318  BP: 126/68  Pulse: 73  Temp: 97.6 F (36.4 C)  Resp: 20     Body mass index is 26.51 kg/(m^2).    ECOG FS: 1 Filed Weights   04/19/13 1318  Weight: 154 lb 8 oz (70.081 kg)   Sclerae unicteric Oropharynx clear No cervical, supraclavicular, or axillary adenopathy Lungs clear to auscultation, no rales or rhonchi Heart regular rate and rhythm Abd soft, nontender, with positive bowel sounds MSK no focal spinal tenderness, no peripheral edema; there is some mild anterior swelling around the left knee,  somewhat tender to palpation Neuro: nonfocal, well oriented, positive affect Breasts: Deferred. On the upper back , there is a very small skin lesion which is rough to touch.  There is no drainage or bleeding from the lesion, and no erythema noted.  The central portion almost appears to be a scab.  Otherwise, no hyperpigmentation.   LAB RESULTS: Lab Results  Component Value Date   WBC 6.4 04/19/2013   NEUTROABS 3.7 04/19/2013   HGB 13.2 04/19/2013   HCT 38.0 04/19/2013   MCV 94.5 04/19/2013   PLT 261 04/19/2013      Chemistry      Component Value Date/Time   NA 140 04/19/2013 1300   NA 139 08/23/2012 1149   K 3.8 04/19/2013 1300   K 3.8 08/23/2012 1149   CL 103 03/22/2013 0754   CL 104 08/23/2012 1149   CO2 29 04/19/2013 1300   CO2  27 08/23/2012 1149   BUN 11.5 04/19/2013 1300   BUN 13 08/23/2012 1149   CREATININE 0.8 04/19/2013 1300   CREATININE 0.6 08/23/2012 1149       Component Value Date/Time   CALCIUM 9.8 04/19/2013 1300   CALCIUM 9.4 08/23/2012 1149   ALKPHOS 86 04/19/2013 1300   ALKPHOS 67 05/11/2012 0924   AST 17 04/19/2013 1300   AST 17 05/11/2012 0924   ALT 14 04/19/2013 1300   ALT 12 05/11/2012 0924   BILITOT 0.27 04/19/2013 1300   BILITOT 0.3 05/11/2012 0924       Lab Results  Component Value Date   LABCA2 57* 08/09/2012    STUDIES: Mr Hip Right W Wo Contrast  04/02/2013   *RADIOLOGY REPORT*  Clinical Data: Metastatic breast cancer.  Right hip pain.  MRI OF THE RIGHT HIP WITHOUT AND WITH CONTRAST  Technique:  Multiplanar, multisequence MR imaging was performed both before and after administration of intravenous contrast.  Contrast: 10mL MULTIHANCE GADOBENATE DIMEGLUMINE 529 MG/ML IV SOLN  Comparison: CT scan 08/24/2012.  Findings: Diffuse sclerotic osseous metastatic disease involving the pelvis and hips is again demonstrated.  No pathologic fracture is identified. There are several small active/non sclerotic metastatic lesions involving the pelvis including the right iliac crest, the left supra acetabular region, the right intertrochanteric region and the left inferior pubic ramus. Several other smaller areas are also noted.  No pathologic fractures are identified.  The surrounding hip and pelvic loss fissure is normal except for mild gluteus medius tendonitis bilaterally.  Pubic symphysis and SI joints are intact.  No hip joint effusion.  No significant intrapelvic abnormalities.  No inguinal mass or adenopathy.  IMPRESSION:  1.  Diffuse sclerotic osseous metastatic disease. 2.  Numerous small active, enhancing non sclerotic lesions. 3.  No pathologic fractures identified.   Original Report Authenticated By: Rudie Meyer, [name].   Dg Knee Complete 4 Views Left  04/19/2013   *RADIOLOGY REPORT*  Clinical Data: Pain and swelling in the left knee for several weeks.  No known injury  LEFT KNEE - COMPLETE 4+ VIEW  Comparison: None.  Findings: Bone density is within  normal limits for age.  There is mild medial joint space narrowing identified.  No significant osteophytosis is identified.  No acute fracture or dislocation is seen.  No abnormal periarticular calcification or signs of chondrocalcinosis are noted.  No joint effusion is seen.  Soft tissue planes appear maintained.  IMPRESSION: Mild medial joint space narrowing.  Otherwise negative   Original Report Authenticated By: Rhodia Albright, [name].     ASSESSMENT:76 y.o.  Mardene Sayer woman    (1) status post left modified radical mastectomy in January 2007 for a T1c N1, stage IIA  invasive lobular carcinoma, grade 1, strongly estrogen and progesterone receptor positive, HER-2 negative, with an MIB-1 of 7%.  (2)  Status post radiation given concurrently with Capecitabine.  Declined chemotherapy.    (3) Status post anastrozole and exemestane, both discontinued due to aches and pains.  Status post tamoxifen between October 2007 until August 2011.  Discontinued secondary to cramps   (4) multiple sclerotic bony lesions noted at initial diagnosis, biopsied x2 August 2009 without malignancy documented, on zoledronic acid annually starting 12/12/2007.  Most recent dose of zoledronic acid given on 01/03/2013.  (5) On fulvestrant since September 2011, with good tolerance  (6)  radiation to the lumbosacral spine and right hip to treat right hip pain thought to be associated with bony metastasis, to begin in July  2014 in the care of Dr. Michell Heinrich.   PLAN:  Liliahna  will receive radiation therapy to the sacrum and lumbar spine as scheduled by Dr. Michell Heinrich. She will continue to receive fulvestrant injections here on a monthly basis, and the next injections will be given today.    I am referring Irina for a plain film x-ray of the left knee, and we are also referring her for further orthopedic evaluation. We will make an appointment for her with Dr. Ave Filter at [facility] per her request.  I am also referring  her to Dr. Jorja Loa for further evaluation of the lesion she is concerned about on her upper back.   Kambry previously had a prescription for gabapentin, 100 mg, to take at bedtime to help with hot flashes and insomnia. She admits that she never tried the medication, but would like to try it now. I will send a new prescription to her pharmacy accordingly.  I will plan on seeing Britton back in one month when she returns for her next fulvestrant injections, so that we might followup on all of these issues. She voices understanding and agreement with this plan, and will call with any changes or problems prior to her next appointment.   Sabirin Baray    04/19/2013

## 2013-04-19 NOTE — Telephone Encounter (Signed)
, °

## 2013-04-25 ENCOUNTER — Ambulatory Visit
Admission: RE | Admit: 2013-04-25 | Discharge: 2013-04-25 | Disposition: A | Discharge status: Home / Self Care | Payer: Medicare Other | Source: Ambulatory Visit | Attending: Radiation Oncology | Admitting: Radiation Oncology

## 2013-04-25 DIAGNOSIS — C50919 Malignant neoplasm of unspecified site of unspecified female breast: Secondary | ICD-10-CM

## 2013-04-26 ENCOUNTER — Ambulatory Visit
Admission: RE | Admit: 2013-04-26 | Discharge: 2013-04-26 | Disposition: A | Discharge status: Home / Self Care | Payer: Medicare Other | Source: Ambulatory Visit | Attending: Radiation Oncology | Admitting: Radiation Oncology

## 2013-04-26 NOTE — Progress Notes (Signed)
  Radiation Oncology         [phone] ________________________________  Name: Jamie [name] MRN: [id number]  Date: 04/25/2013  DOB: 12/21/1935  Simulation Verification Note  Status: outpatient  NARRATIVE: The patient was brought to the treatment unit and placed in the planned treatment position. The clinical setup was verified. Then port films were obtained and uploaded to the radiation oncology medical record software.  The treatment beams were carefully compared against the planned radiation fields. The position location and shape of the radiation fields was reviewed. The targeted volume of tissue appears appropriately covered by the radiation beams. Organs at risk appear to be excluded as planned.  Based on my personal review, I approved the simulation verification. The patient's treatment will proceed as planned.  ------------------------------------------------  Lurline Hare, MD

## 2013-04-27 ENCOUNTER — Ambulatory Visit
Admission: RE | Admit: 2013-04-27 | Discharge: 2013-04-27 | Disposition: A | Discharge status: Home / Self Care | Payer: Medicare Other | Source: Ambulatory Visit | Attending: Radiation Oncology | Admitting: Radiation Oncology

## 2013-04-27 ENCOUNTER — Encounter: Payer: Self-pay | Admitting: Radiation Oncology

## 2013-04-27 VITALS — BP 152/68 | HR 73 | Temp 98.1°F | Resp 20

## 2013-04-27 DIAGNOSIS — C50919 Malignant neoplasm of unspecified site of unspecified female breast: Secondary | ICD-10-CM

## 2013-04-27 DIAGNOSIS — C7951 Secondary malignant neoplasm of bone: Secondary | ICD-10-CM

## 2013-04-27 NOTE — Progress Notes (Signed)
Completed post sim ed w/pt. Gave pt "Radiation and You" booklet w/al pertinent information marked and discussed, re: diarrhea/ management, fatigue, n/v and management, nutrition, pain. All questions answered; pt verbalized understanding.

## 2013-04-27 NOTE — Progress Notes (Signed)
Pt in nursing following radiation treatment to her L spine, sacrum. She c/o 5 days of pain under her right breast in her ribcage that is worse w/coughing or deep inspiration. She states she has a "stuffy nose, dry cough, drainage in the back of her throat". Pt denies SOB, sore throat, fever. She has been taking Mucinex all week w/o relief. She has history of asthma, sinus infections and pneumonia. She states "this is how she felt when she had pneumonia in the past". Pt has requested to see Dr Michell Heinrich today. Lungs clear to auscultation.

## 2013-04-27 NOTE — Progress Notes (Signed)
Weekly Management Note Current Dose:5 Gy  Projected Dose:30 Gy   Narrative:  The patient presents for routine under treatment assessment.  CBCT/MVCT images/Port film x-rays were reviewed.  The chart was checked. She asked to be seen today due to increasing sinus pressure, right rib pain and post nasal drainage.  No fevers, chills or deep cough. Feels similar to her previous bouts with pneumonia she says. Pain in ribs is completely relieved with tylenol but wears off by 4 hours.   Physical Findings:  Not in respiratory distress. Appears well. Lungs are clear.   Vitals:  Filed Vitals:   04/27/13 1306  BP: 152/68  Pulse: 73  Temp: 98.1 F (36.7 C)  Resp: 20   Weight:  Wt Readings from Last 3 Encounters:  04/19/13 154 lb 8 oz (70.081 kg)  04/12/13 153 lb 12.8 oz (69.763 kg)  12/28/12 154 lb 9.6 oz (70.126 kg)   Lab Results  Component Value Date   WBC 6.4 04/19/2013   HGB 13.2 04/19/2013   HCT 38.0 04/19/2013   MCV 94.5 04/19/2013   PLT 261 04/19/2013   Lab Results  Component Value Date   CREATININE 0.8 04/19/2013   BUN 11.5 04/19/2013   NA 140 04/19/2013   K 3.8 04/19/2013   CL 103 03/22/2013   CO2 29 04/19/2013     Impression:  The patient is tolerating radiation.  Plan:  Continue treatment as planned. No indication of pneumonia.  Possible sinus infection or allergies. Encouraged to contact PCP or astham doctor and she agreed. Call with question or worsening symptoms.

## 2013-04-30 ENCOUNTER — Ambulatory Visit
Admission: RE | Admit: 2013-04-30 | Discharge: 2013-04-30 | Disposition: A | Discharge status: Home / Self Care | Payer: Medicare Other | Source: Ambulatory Visit | Attending: Radiation Oncology | Admitting: Radiation Oncology

## 2013-04-30 ENCOUNTER — Ambulatory Visit
Admission: RE | Admit: 2013-04-30 | Discharge: 2013-04-30 | Disposition: A | Discharge status: Home / Self Care | Payer: Medicare Other | Source: Ambulatory Visit | Attending: Pediatrics | Admitting: Pediatrics

## 2013-04-30 ENCOUNTER — Encounter: Payer: Self-pay | Admitting: *Deleted

## 2013-04-30 ENCOUNTER — Other Ambulatory Visit: Payer: Self-pay | Admitting: Pediatrics

## 2013-04-30 DIAGNOSIS — R079 Chest pain, unspecified: Secondary | ICD-10-CM

## 2013-04-30 DIAGNOSIS — R05 Cough: Secondary | ICD-10-CM

## 2013-04-30 NOTE — Progress Notes (Signed)
Jamie [name] requested to speak with this RN post radiation due to primary MD prescribed Z pak and prednisone ( 10mg  bid x 4 days ) for noted abnormal CXR with " pneumonia ".  " he wanted me to check with Dr Darnelle Catalan to make sure it is ok that I take the above"  Note pt is under radiation to hip and lower spine for metastatic breast cancer.  This RN informed pt to proceed with [name] prescribed by primary MD. Discussed also need to do " deep breathing and coughing" with instruction to bind chest  to assist with improving lung status.  Jamie [name] stating use of tylenol is beneficial as well as " I am scared of pain medications ".

## 2013-05-01 ENCOUNTER — Ambulatory Visit
Admission: RE | Admit: 2013-05-01 | Discharge: 2013-05-01 | Disposition: A | Discharge status: Home / Self Care | Payer: Medicare Other | Source: Ambulatory Visit | Attending: Radiation Oncology | Admitting: Radiation Oncology

## 2013-05-01 ENCOUNTER — Encounter: Payer: Self-pay | Admitting: Radiation Oncology

## 2013-05-01 VITALS — BP 135/71 | HR 61 | Temp 97.4°F | Resp 20

## 2013-05-01 DIAGNOSIS — C50919 Malignant neoplasm of unspecified site of unspecified female breast: Secondary | ICD-10-CM

## 2013-05-01 NOTE — Progress Notes (Signed)
Weekly Management Note Current Dose:  10 Gy  Projected Dose:  30 Gy   Narrative:  The patient presents for routine under treatment assessment.  CBCT/MVCT images/Port film x-rays were reviewed.  The chart was checked. Doing well with pain in back but worried about cough, bone pain, shortness of breath. On antibiotics and steroids. Had CXR which was clar.  Showed stable sclerotic bone lesions which her PCP was not aware of.   Physical Findings: Weight:  . Unchanged. Coughing in exam room. No respiratory distress when walking and conversing.   Impression:  The patient is tolerating radiation.  Plan:  Continue treatment as planned.

## 2013-05-01 NOTE — Progress Notes (Signed)
Weekly rad txs Lspine/sacrum, patient started her z-pack and prednisone yesterday along wityh a breathing tx at her Asthma Dr's. Office, has dry cough still, c/o right rib pain soreness, when standing has satbbing pain, for a short time, sometimes hurts under her right breast,especially when deep breathing, still takin gher inhalers, feels some better, had rough wekend though, energy low, eating fair pain  Level 4-5 now on 1-10 scale, take tylenol e.s, 1 prn Most time only 2 daily, no nausea, regular bowels 11:33 AM

## 2013-05-02 ENCOUNTER — Ambulatory Visit
Admission: RE | Admit: 2013-05-02 | Discharge: 2013-05-02 | Disposition: A | Discharge status: Home / Self Care | Payer: Medicare Other | Source: Ambulatory Visit | Attending: Radiation Oncology | Admitting: Radiation Oncology

## 2013-05-03 ENCOUNTER — Ambulatory Visit
Admission: RE | Admit: 2013-05-03 | Discharge: 2013-05-03 | Disposition: A | Discharge status: Home / Self Care | Payer: Medicare Other | Source: Ambulatory Visit | Attending: Radiation Oncology | Admitting: Radiation Oncology

## 2013-05-04 ENCOUNTER — Ambulatory Visit
Admission: RE | Admit: 2013-05-04 | Discharge: 2013-05-04 | Disposition: A | Discharge status: Home / Self Care | Payer: Medicare Other | Source: Ambulatory Visit | Attending: Radiation Oncology | Admitting: Radiation Oncology

## 2013-05-07 ENCOUNTER — Ambulatory Visit
Admission: RE | Admit: 2013-05-07 | Discharge: 2013-05-07 | Disposition: A | Discharge status: Home / Self Care | Payer: Medicare Other | Source: Ambulatory Visit | Attending: Radiation Oncology | Admitting: Radiation Oncology

## 2013-05-08 ENCOUNTER — Ambulatory Visit
Admission: RE | Admit: 2013-05-08 | Discharge: 2013-05-08 | Disposition: A | Discharge status: Home / Self Care | Payer: Medicare Other | Source: Ambulatory Visit | Attending: Radiation Oncology | Admitting: Radiation Oncology

## 2013-05-08 ENCOUNTER — Encounter: Payer: Self-pay | Admitting: Radiation Oncology

## 2013-05-08 VITALS — BP 131/75 | HR 72 | Temp 97.7°F | Resp 20 | Wt 155.9 lb

## 2013-05-08 DIAGNOSIS — C50919 Malignant neoplasm of unspecified site of unspecified female breast: Secondary | ICD-10-CM

## 2013-05-08 MED ORDER — OXYCODONE-ACETAMINOPHEN 5-325 MG PO TABS: 1.0000 | ORAL_TABLET | ORAL | Status: DC | PRN

## 2013-05-08 NOTE — Progress Notes (Signed)
Weekly Management Note Current Dose:  22.5 Gy  Projected Dose: 30 Gy   Narrative:  The patient presents for routine under treatment assessment.  CBCT/MVCT images/Port film x-rays were reviewed.  The chart was checked. States she is having pain in her right hip that is not relieved by tylenol. Only when she is lying down.  Pain seems to be worse although last night was the first night she has slept in many weeks. She is also having diarrhea (controlled with immodium) and feelings of an upset stomach.  Respiratory symptoms are better. Completed zpack.   Physical Findings: Weight: 155 lb 14.4 oz (70.716 kg). Unchanged. Alert, awake. No distress.  Sits comfortably on exam room chair and gets up with ease.   Impression:  The patient is tolerating radiation.  Plan:  Continue treatment as planned. Gave script for percocet. Stop taking clonazepam at night until she sees how this affects her. Possible GI distress from RT although asked her to let me know if the diarrhea became watery or extremely foul smelling.

## 2013-05-08 NOTE — Progress Notes (Addendum)
patient weekly rad txs 9/12  Completed L-spine/sacrum, c/o bone pain right rib and right hip/sacrum, takes e.s. Tylenol prn"I need Rx pain medication ,that tylenol not helping any", has diarrhea taking imodium prn, 1 bout yetsreday, day before 2-3 bouts diarrhea, none this am, but "my stomach is rolling' requesting rx pain med, slight fatigue, antibiotics and prednisone completed,still has dry cough 11:25 AM

## 2013-05-09 ENCOUNTER — Ambulatory Visit
Admission: RE | Admit: 2013-05-09 | Discharge: 2013-05-09 | Disposition: A | Discharge status: Home / Self Care | Payer: Medicare Other | Source: Ambulatory Visit | Attending: Radiation Oncology | Admitting: Radiation Oncology

## 2013-05-10 ENCOUNTER — Ambulatory Visit
Admission: RE | Admit: 2013-05-10 | Discharge: 2013-05-10 | Disposition: A | Discharge status: Home / Self Care | Payer: Medicare Other | Source: Ambulatory Visit | Attending: Radiation Oncology | Admitting: Radiation Oncology

## 2013-05-11 ENCOUNTER — Ambulatory Visit
Admission: RE | Admit: 2013-05-11 | Discharge: 2013-05-11 | Disposition: A | Discharge status: Home / Self Care | Payer: Medicare Other | Source: Ambulatory Visit | Attending: Radiation Oncology | Admitting: Radiation Oncology

## 2013-05-11 ENCOUNTER — Encounter: Payer: Self-pay | Admitting: Radiation Oncology

## 2013-05-12 ENCOUNTER — Emergency Department (HOSPITAL_COMMUNITY)
Admission: EM | Admit: 2013-05-12 | Discharge: 2013-05-12 | Disposition: A | Discharge status: Home / Self Care | Payer: Medicare Other | Attending: Emergency Medicine | Admitting: Emergency Medicine

## 2013-05-12 ENCOUNTER — Encounter (HOSPITAL_COMMUNITY): Payer: Self-pay | Admitting: *Deleted

## 2013-05-12 DIAGNOSIS — Z90710 Acquired absence of both cervix and uterus: Secondary | ICD-10-CM | POA: Insufficient documentation

## 2013-05-12 DIAGNOSIS — Z860100 Personal history of colon polyps, unspecified: Secondary | ICD-10-CM | POA: Insufficient documentation

## 2013-05-12 DIAGNOSIS — Z8669 Personal history of other diseases of the nervous system and sense organs: Secondary | ICD-10-CM | POA: Insufficient documentation

## 2013-05-12 DIAGNOSIS — Z9109 Other allergy status, other than to drugs and biological substances: Secondary | ICD-10-CM | POA: Insufficient documentation

## 2013-05-12 DIAGNOSIS — E785 Hyperlipidemia, unspecified: Secondary | ICD-10-CM | POA: Insufficient documentation

## 2013-05-12 DIAGNOSIS — Z8719 Personal history of other diseases of the digestive system: Secondary | ICD-10-CM | POA: Insufficient documentation

## 2013-05-12 DIAGNOSIS — I059 Rheumatic mitral valve disease, unspecified: Secondary | ICD-10-CM | POA: Insufficient documentation

## 2013-05-12 DIAGNOSIS — Z9889 Other specified postprocedural states: Secondary | ICD-10-CM | POA: Insufficient documentation

## 2013-05-12 DIAGNOSIS — R197 Diarrhea, unspecified: Secondary | ICD-10-CM

## 2013-05-12 DIAGNOSIS — Z8739 Personal history of other diseases of the musculoskeletal system and connective tissue: Secondary | ICD-10-CM | POA: Insufficient documentation

## 2013-05-12 DIAGNOSIS — F3289 Other specified depressive episodes: Secondary | ICD-10-CM | POA: Insufficient documentation

## 2013-05-12 DIAGNOSIS — E119 Type 2 diabetes mellitus without complications: Secondary | ICD-10-CM | POA: Insufficient documentation

## 2013-05-12 DIAGNOSIS — F411 Generalized anxiety disorder: Secondary | ICD-10-CM | POA: Insufficient documentation

## 2013-05-12 DIAGNOSIS — K219 Gastro-esophageal reflux disease without esophagitis: Secondary | ICD-10-CM | POA: Insufficient documentation

## 2013-05-12 DIAGNOSIS — Z853 Personal history of malignant neoplasm of breast: Secondary | ICD-10-CM | POA: Insufficient documentation

## 2013-05-12 DIAGNOSIS — Z79899 Other long term (current) drug therapy: Secondary | ICD-10-CM | POA: Insufficient documentation

## 2013-05-12 DIAGNOSIS — F329 Major depressive disorder, single episode, unspecified: Secondary | ICD-10-CM | POA: Insufficient documentation

## 2013-05-12 DIAGNOSIS — J45909 Unspecified asthma, uncomplicated: Secondary | ICD-10-CM | POA: Insufficient documentation

## 2013-05-12 DIAGNOSIS — K625 Hemorrhage of anus and rectum: Secondary | ICD-10-CM | POA: Insufficient documentation

## 2013-05-12 DIAGNOSIS — R109 Unspecified abdominal pain: Secondary | ICD-10-CM

## 2013-05-12 DIAGNOSIS — Z9010 Acquired absence of unspecified breast and nipple: Secondary | ICD-10-CM | POA: Insufficient documentation

## 2013-05-12 LAB — CBC WITH DIFFERENTIAL/PLATELET
Eosinophils Absolute: 1.2 10*3/uL — ABNORMAL HIGH (ref 0.0–0.7)
Eosinophils Relative: 18 % — ABNORMAL HIGH (ref 0–5)
Hemoglobin: 11.9 g/dL — ABNORMAL LOW (ref 12.0–15.0)
Lymphs Abs: 0.5 10*3/uL — ABNORMAL LOW (ref 0.7–4.0)
MCH: 32.3 pg (ref 26.0–34.0)
MCV: 95.7 fL (ref 78.0–100.0)
Monocytes Relative: 11 % (ref 3–12)
Neutrophils Relative %: 64 % (ref 43–77)
RBC: 3.68 MIL/uL — ABNORMAL LOW (ref 3.87–5.11)

## 2013-05-12 LAB — LIPASE, BLOOD: Lipase: 21 U/L (ref 11–59)

## 2013-05-12 LAB — COMPREHENSIVE METABOLIC PANEL
Alkaline Phosphatase: 64 U/L (ref 39–117)
BUN: 10 mg/dL (ref 6–23)
CO2: 29 mEq/L (ref 19–32)
GFR calc Af Amer: >90 mL/min (ref >90)
GFR calc non Af Amer: >90 mL/min (ref >90)
Glucose, Bld: 119 mg/dL — ABNORMAL HIGH (ref 70–99)
Potassium: 4.3 mEq/L (ref 3.5–5.1)
Total Protein: 6.2 g/dL (ref 6.0–8.3)

## 2013-05-12 MED ORDER — ONDANSETRON HCL 4 MG/2ML IJ SOLN
4.0000 mg | Freq: Once | INTRAMUSCULAR | Status: AC
  Administered 2013-05-12: 4 mg via INTRAVENOUS
  Filled 2013-05-12: qty 2

## 2013-05-12 MED ORDER — SODIUM CHLORIDE 0.9 % IV SOLN
1000.0000 mL | Freq: Once | INTRAVENOUS | Status: AC
  Administered 2013-05-12: 1000 mL via INTRAVENOUS

## 2013-05-12 MED ORDER — METRONIDAZOLE 500 MG PO TABS
500.0000 mg | ORAL_TABLET | Freq: Once | ORAL | Status: AC
  Administered 2013-05-12: 500 mg via ORAL
  Filled 2013-05-12: qty 1

## 2013-05-12 MED ORDER — METRONIDAZOLE 500 MG PO TABS: 500.0000 mg | ORAL_TABLET | Freq: Two times a day (BID) | ORAL | Status: DC

## 2013-05-12 MED ORDER — ONDANSETRON 4 MG PO TBDP: 4.0000 mg | ORAL_TABLET | Freq: Three times a day (TID) | ORAL | Status: DC | PRN

## 2013-05-12 NOTE — ED Provider Notes (Addendum)
CSN: [id number]     Arrival date & time 05/12/13  1228 History     First MD Initiated Contact with Patient 05/12/13 1306     Chief Complaint  Patient presents with  . Abdominal Pain  . Diarrhea   (Consider location/radiation/quality/duration/timing/severity/associated sxs/prior Treatment) HPI Patient presents with concern of ongoing diarrhea, lower abdominal cramping. Symptoms began approximately one week ago.  Since onset symptoms have been intermittent, but more present over the past 24 hours.  The discomfort is focally about the lower abdomen, bilaterally.  It is crampy, occurring without clear precipitant, or any clear alleviating or exacerbating factors. No relief with Imodium. No concurrent fever, confusion, disorientation, chest pain, dyspnea. No vomiting, no anorexia. No syncope, though the patient does feel fatigued. Patient has no history of metastatic breast cancer, now status post mastectomy with diffuse bone metastases. She had radiation therapy yesterday, that was his session 12 of 12. Additionally, the patient completed antibiotics one week ago for URI. She denies current cough.  Past Medical History  Diagnosis Date  . Anxiety   . Asthma   . Breast cancer     metastasized  . Depression   . Diabetes mellitus   . GERD (gastroesophageal reflux disease)   . Hyperlipidemia   . Internal hemorrhoids   . Hx of adenomatous colonic polyps   . IBS (irritable bowel syndrome)   . Mitral valve prolapse   . Ischemic colitis   . Fibromyalgia   . Allergy   . Cataract    Past Surgical History  Procedure Laterality Date  . Mastectomy      left  and then treated with chemo and radiation  . Total abdominal hysterectomy    . Back surgery      synovial cyst removed from spine  . Carpal tunnel release      bilateral  . Dilation and curettage of uterus    . Appendectomy    . Cataract surgery Bilateral    Family History  Problem Relation Age of Onset  . Prostate cancer  Brother     x2  . Irritable bowel syndrome Mother   . Colon cancer Neg Hx   . Stroke Father    History  Substance Use Topics  . Smoking status: Never Smoker   . Smokeless tobacco: Never Used  . Alcohol Use: No   OB History   Grav Para Term Preterm Abortions TAB SAB Ect Mult Living                 Review of Systems  Constitutional:       Per HPI, otherwise negative  HENT:       Per HPI, otherwise negative  Respiratory:       Per HPI, otherwise negative  Cardiovascular:       Per HPI, otherwise negative  Gastrointestinal: Positive for abdominal pain, diarrhea and anal bleeding. Negative for vomiting, constipation, blood in stool and abdominal distention.  Endocrine:       Negative aside from HPI  Genitourinary:       Neg aside from HPI   Musculoskeletal:       Per HPI, otherwise negative  Skin: Negative.   Neurological: Negative for syncope.    Allergies  Niacin and Promethazine hcl  Home Medications   Current Outpatient Rx  Name  Route  Sig  Dispense  Refill  . Alum Hydroxide-Mag Carbonate (GAVISCON PO)   Oral   Take 1 capsule by mouth daily.         Marland Kitchen  azithromycin (ZITHROMAX) 1 G powder   Oral   Take 1 packet by mouth once. Take as directed, until completed         . beclomethasone (QVAR) 40 MCG/ACT inhaler   Inhalation   Inhale 2 puffs into the lungs at bedtime.         . betamethasone valerate ointment (VALISONE) 0.1 %               . BYSTOLIC 5 MG tablet   Oral   Take 1 tablet by mouth Daily.         . Cholecalciferol (D3 ADULT) 1000 UNITS CHEW   Oral   Chew 1,000 Units by mouth daily.         . clonazePAM (KLONOPIN) 0.5 MG tablet   Oral   Take 0.5 mg by mouth 2 (two) times daily.           . CRESTOR 5 MG tablet   Oral   Take 1 tablet by mouth Daily.         . fluconazole (DIFLUCAN) 150 MG tablet      as needed.          . fulvestrant (FASLODEX) 250 MG/5ML injection   Intramuscular   Inject 250 mg into the muscle  every 30 (thirty) days. One injection each buttock over 1-2 minutes. Warm prior to use.         . gabapentin (NEURONTIN) 100 MG capsule   Oral   Take 3 capsules (300 mg total) by mouth at bedtime.   30 capsule   1   . glimepiride (AMARYL) 1 MG tablet   Oral   Take 0.5 tablets by mouth Daily.          Marland Kitchen HYDROCHLOROTHIAZIDE PO   Oral   Take 75 mg by mouth as needed. swelling          . meclizine (ANTIVERT) 25 MG tablet   Oral   Take 1 tablet by mouth as needed. dizziness         . metFORMIN (GLUCOPHAGE) 500 MG tablet   Oral   Take 1 tablet by mouth Daily.         Marland Kitchen NASONEX 50 MCG/ACT nasal spray   Nasal   1 spray by Nasal route at bedtime.         Marland Kitchen NEXIUM 40 MG capsule   Oral   Take 1 tablet by mouth Daily.         Marland Kitchen nystatin (MYCOSTATIN) 100000 UNIT/ML suspension               . ondansetron (ZOFRAN) 4 MG tablet      Take 1 tab twice daily as needed for nausea.   40 tablet   2   . ONGLYZA 5 MG TABS tablet   Oral   Take 5 mg by mouth daily.          Marland Kitchen oxyCODONE-acetaminophen (PERCOCET/ROXICET) 5-325 MG per tablet   Oral   Take 1 tablet by mouth every 4 (four) hours as needed for pain.   50 tablet   0   . polyethylene glycol powder (GLYCOLAX/MIRALAX) powder   Oral   Take 17 g by mouth daily.         . predniSONE (DELTASONE) 10 MG tablet   Oral   Take 10 mg by mouth daily. 10mg  bid x 10 days         . PROAIR HFA 108 (90 BASE) MCG/ACT inhaler               .  sucralfate (CARAFATE) 1 G tablet   Oral   Take 1 tablet (1 g total) by mouth 4 (four) times daily.   120 tablet   1    BP 133/64  Pulse 84  Temp(Src) 98.5 F (36.9 C) (Oral)  Resp 16  SpO2 98% Physical Exam  Nursing note and vitals reviewed. Constitutional: She is oriented to person, place, and time. She appears well-developed and well-nourished. No distress.  HENT:  Head: Normocephalic and atraumatic.  Eyes: Conjunctivae and EOM are normal.  Cardiovascular:  Normal rate and regular rhythm.   Pulmonary/Chest: Effort normal and breath sounds normal. No stridor. No respiratory distress.  Abdominal: She exhibits no distension. There is tenderness.  Mild discomfort with palpation across the lower abdomen without peritoneal findings.  Musculoskeletal: She exhibits no edema.  Neurological: She is alert and oriented to person, place, and time. No cranial nerve deficit.  Skin: Skin is warm and dry.  Psychiatric: She has a normal mood and affect.    ED Course   Procedures (including critical care time)  Labs Reviewed  CLOSTRIDIUM DIFFICILE BY PCR  CBC WITH DIFFERENTIAL  COMPREHENSIVE METABOLIC PANEL  LIPASE, BLOOD  URINALYSIS, ROUTINE W REFLEX MICROSCOPIC   No results found. No diagnosis found. Following initial evaluation I reviewed the patient's chart, including her oncologic history.  3:48 PM Patient states that she feels substantially better.  No additional cramping. We discussed all results thus far, including pending C. difficile tests. Patient prefers discharge. MDM  Patient presents with one week of abdominal crampy, diarrhea.  She denies fevers, chills, upper abdominal pain, other notable complaints.  With her description of ongoing radiation therapy, recent antibiotic use, post radiation colitis and C. difficile colitis unlikely considerations.  Patient's labs however, are largely reassuring, and she is afebrile.  Patient had substantial improvement with Zofran. With no leukocytosis, fever, or peritoneal findings, CT imaging was not indicated. She was discharged in stable condition to follow up with her oncologic team, primary care for pending lab results and further evaluation and management of her diarrhea.  Gerhard Munch, MD 05/12/13 1550  Gerhard Munch, MD 05/12/13 [phone]

## 2013-05-12 NOTE — ED Notes (Signed)
Pt reports abd cramping and diarrhea x1 week. Has had a total of 12 radiation treatments, last treatment was yesterday. Cancer to right hip.

## 2013-05-12 NOTE — ED Notes (Signed)
Pt given specimen cup for cdiff, instructed to bring specimen back to lab.

## 2013-05-13 NOTE — Progress Notes (Signed)
  Radiation Oncology         [phone] ________________________________  Name: Jamie [name] MRN: [id number]  Date: 05/11/2013  DOB: [birth year]  End of Treatment Note  Diagnosis:   Bone metastases presumably from breast cancer   Indication for treatment:  Palliative       Radiation treatment dates:   04/26/2013-05/11/2013  Site/dose:   L spine and sacrum (30 Gy in 12 fractions at 2.5 Gy per fraction)  Beams/energy:   AP/PA with 6 and 15 MV photons  Narrative: The patient tolerated radiation treatment relatively well.   She had mild diarrhea which was controlled with immodium. Her pain improved through treatment.   Plan: The patient has completed radiation treatment. The patient will return to radiation oncology clinic for routine followup in one month. I advised them to call or return sooner if they have any questions or concerns related to their recovery or treatment.  ------------------------------------------------  Lurline Hare, MD

## 2013-05-14 ENCOUNTER — Other Ambulatory Visit: Payer: Self-pay | Admitting: Oncology

## 2013-05-14 ENCOUNTER — Encounter (HOSPITAL_COMMUNITY): Payer: Self-pay | Admitting: Emergency Medicine

## 2013-05-14 ENCOUNTER — Other Ambulatory Visit: Payer: Self-pay | Admitting: *Deleted

## 2013-05-14 ENCOUNTER — Inpatient Hospital Stay (HOSPITAL_COMMUNITY)
Admission: EM | Admit: 2013-05-14 | Discharge: 2013-05-18 | DRG: 394 | Disposition: A | Discharge status: Home / Self Care | Payer: Medicare Other | Attending: Internal Medicine | Admitting: Internal Medicine

## 2013-05-14 ENCOUNTER — Emergency Department (HOSPITAL_COMMUNITY): Payer: Medicare Other

## 2013-05-14 DIAGNOSIS — Z803 Family history of malignant neoplasm of breast: Secondary | ICD-10-CM

## 2013-05-14 DIAGNOSIS — K219 Gastro-esophageal reflux disease without esophagitis: Secondary | ICD-10-CM | POA: Diagnosis present

## 2013-05-14 DIAGNOSIS — Z79899 Other long term (current) drug therapy: Secondary | ICD-10-CM

## 2013-05-14 DIAGNOSIS — K644 Residual hemorrhoidal skin tags: Secondary | ICD-10-CM | POA: Diagnosis present

## 2013-05-14 DIAGNOSIS — F329 Major depressive disorder, single episode, unspecified: Secondary | ICD-10-CM | POA: Diagnosis present

## 2013-05-14 DIAGNOSIS — K589 Irritable bowel syndrome without diarrhea: Secondary | ICD-10-CM | POA: Diagnosis present

## 2013-05-14 DIAGNOSIS — K625 Hemorrhage of anus and rectum: Secondary | ICD-10-CM

## 2013-05-14 DIAGNOSIS — Z860100 Personal history of colon polyps, unspecified: Secondary | ICD-10-CM

## 2013-05-14 DIAGNOSIS — R933 Abnormal findings on diagnostic imaging of other parts of digestive tract: Secondary | ICD-10-CM

## 2013-05-14 DIAGNOSIS — I10 Essential (primary) hypertension: Secondary | ICD-10-CM

## 2013-05-14 DIAGNOSIS — C50919 Malignant neoplasm of unspecified site of unspecified female breast: Secondary | ICD-10-CM | POA: Diagnosis present

## 2013-05-14 DIAGNOSIS — F411 Generalized anxiety disorder: Secondary | ICD-10-CM | POA: Diagnosis present

## 2013-05-14 DIAGNOSIS — Q6239 Other obstructive defects of renal pelvis and ureter: Secondary | ICD-10-CM

## 2013-05-14 DIAGNOSIS — Z9010 Acquired absence of unspecified breast and nipple: Secondary | ICD-10-CM

## 2013-05-14 DIAGNOSIS — I059 Rheumatic mitral valve disease, unspecified: Secondary | ICD-10-CM | POA: Diagnosis present

## 2013-05-14 DIAGNOSIS — R109 Unspecified abdominal pain: Secondary | ICD-10-CM

## 2013-05-14 DIAGNOSIS — J45909 Unspecified asthma, uncomplicated: Secondary | ICD-10-CM | POA: Diagnosis present

## 2013-05-14 DIAGNOSIS — E785 Hyperlipidemia, unspecified: Secondary | ICD-10-CM | POA: Diagnosis present

## 2013-05-14 DIAGNOSIS — Z8719 Personal history of other diseases of the digestive system: Secondary | ICD-10-CM

## 2013-05-14 DIAGNOSIS — M1990 Unspecified osteoarthritis, unspecified site: Secondary | ICD-10-CM | POA: Diagnosis present

## 2013-05-14 DIAGNOSIS — E860 Dehydration: Secondary | ICD-10-CM

## 2013-05-14 DIAGNOSIS — R197 Diarrhea, unspecified: Secondary | ICD-10-CM

## 2013-05-14 DIAGNOSIS — K529 Noninfective gastroenteritis and colitis, unspecified: Secondary | ICD-10-CM

## 2013-05-14 DIAGNOSIS — F410 Panic disorder [episodic paroxysmal anxiety] without agoraphobia: Secondary | ICD-10-CM | POA: Diagnosis present

## 2013-05-14 DIAGNOSIS — T66XXXS Radiation sickness, unspecified, sequela: Secondary | ICD-10-CM

## 2013-05-14 DIAGNOSIS — K520 Gastroenteritis and colitis due to radiation: Principal | ICD-10-CM | POA: Diagnosis present

## 2013-05-14 DIAGNOSIS — K5900 Constipation, unspecified: Secondary | ICD-10-CM | POA: Diagnosis not present

## 2013-05-14 DIAGNOSIS — K6289 Other specified diseases of anus and rectum: Secondary | ICD-10-CM | POA: Diagnosis present

## 2013-05-14 DIAGNOSIS — E876 Hypokalemia: Secondary | ICD-10-CM | POA: Diagnosis present

## 2013-05-14 DIAGNOSIS — H8109 Meniere's disease, unspecified ear: Secondary | ICD-10-CM | POA: Diagnosis present

## 2013-05-14 DIAGNOSIS — Y842 Radiological procedure and radiotherapy as the cause of abnormal reaction of the patient, or of later complication, without mention of misadventure at the time of the procedure: Secondary | ICD-10-CM | POA: Diagnosis present

## 2013-05-14 DIAGNOSIS — IMO0001 Reserved for inherently not codable concepts without codable children: Secondary | ICD-10-CM | POA: Diagnosis present

## 2013-05-14 DIAGNOSIS — E119 Type 2 diabetes mellitus without complications: Secondary | ICD-10-CM

## 2013-05-14 DIAGNOSIS — K5289 Other specified noninfective gastroenteritis and colitis: Secondary | ICD-10-CM

## 2013-05-14 DIAGNOSIS — M797 Fibromyalgia: Secondary | ICD-10-CM | POA: Diagnosis present

## 2013-05-14 DIAGNOSIS — F3289 Other specified depressive episodes: Secondary | ICD-10-CM | POA: Diagnosis present

## 2013-05-14 DIAGNOSIS — C7951 Secondary malignant neoplasm of bone: Secondary | ICD-10-CM | POA: Diagnosis present

## 2013-05-14 DIAGNOSIS — D649 Anemia, unspecified: Secondary | ICD-10-CM | POA: Diagnosis present

## 2013-05-14 LAB — CBC WITH DIFFERENTIAL/PLATELET
Basophils Relative: 0 % (ref 0–1)
Eosinophils Relative: 26 % — ABNORMAL HIGH (ref 0–5)
Hemoglobin: 11.5 g/dL — ABNORMAL LOW (ref 12.0–15.0)
Lymphocytes Relative: 6 % — ABNORMAL LOW (ref 12–46)
Neutrophils Relative %: 61 % (ref 43–77)
Platelets: 154 10*3/uL (ref 150–400)
RBC: 3.55 MIL/uL — ABNORMAL LOW (ref 3.87–5.11)
WBC: 8.8 10*3/uL (ref 4.0–10.5)

## 2013-05-14 LAB — COMPREHENSIVE METABOLIC PANEL
BUN: 6 mg/dL (ref 6–23)
CO2: 28 mEq/L (ref 19–32)
Calcium: 9 mg/dL (ref 8.4–10.5)
Chloride: 104 mEq/L (ref 96–112)
Creatinine, Ser: 0.54 mg/dL (ref 0.50–1.10)
GFR calc Af Amer: >90 mL/min (ref >90)
GFR calc non Af Amer: >90 mL/min (ref >90)
Glucose, Bld: 164 mg/dL — ABNORMAL HIGH (ref 70–99)
Total Bilirubin: 0.2 mg/dL — ABNORMAL LOW (ref 0.3–1.2)

## 2013-05-14 LAB — URINALYSIS, ROUTINE W REFLEX MICROSCOPIC
Bilirubin Urine: NEGATIVE
Glucose, UA: NEGATIVE mg/dL
Hgb urine dipstick: NEGATIVE
Ketones, ur: NEGATIVE mg/dL
Protein, ur: NEGATIVE mg/dL

## 2013-05-14 LAB — URINE MICROSCOPIC-ADD ON

## 2013-05-14 MED ORDER — POTASSIUM CHLORIDE CRYS ER 20 MEQ PO TBCR
40.0000 meq | EXTENDED_RELEASE_TABLET | Freq: Once | ORAL | Status: AC
  Administered 2013-05-14: 40 meq via ORAL
  Filled 2013-05-14: qty 2

## 2013-05-14 MED ORDER — METRONIDAZOLE IN NACL 5-0.79 MG/ML-% IV SOLN
500.0000 mg | Freq: Once | INTRAVENOUS | Status: AC
  Administered 2013-05-15: 500 mg via INTRAVENOUS
  Filled 2013-05-14: qty 100

## 2013-05-14 MED ORDER — IOHEXOL 300 MG/ML  SOLN
100.0000 mL | Freq: Once | INTRAMUSCULAR | Status: AC | PRN
  Administered 2013-05-14: 100 mL via INTRAVENOUS

## 2013-05-14 MED ORDER — SODIUM CHLORIDE 0.9 % IV BOLUS (SEPSIS)
1000.0000 mL | Freq: Once | INTRAVENOUS | Status: AC
  Administered 2013-05-14: 1000 mL via INTRAVENOUS

## 2013-05-14 MED ORDER — CIPROFLOXACIN IN D5W 400 MG/200ML IV SOLN
400.0000 mg | Freq: Once | INTRAVENOUS | Status: AC
  Administered 2013-05-15: 400 mg via INTRAVENOUS
  Filled 2013-05-14: qty 200

## 2013-05-14 MED ORDER — ONDANSETRON HCL 4 MG/2ML IJ SOLN
4.0000 mg | Freq: Once | INTRAMUSCULAR | Status: AC
  Administered 2013-05-14: 4 mg via INTRAVENOUS
  Filled 2013-05-14: qty 2

## 2013-05-14 MED ORDER — IOHEXOL 300 MG/ML  SOLN
50.0000 mL | Freq: Once | INTRAMUSCULAR | Status: AC | PRN
  Administered 2013-05-14: 50 mL via ORAL

## 2013-05-14 MED ORDER — MORPHINE SULFATE 4 MG/ML IJ SOLN
4.0000 mg | Freq: Once | INTRAMUSCULAR | Status: AC
  Administered 2013-05-14: 4 mg via INTRAVENOUS
  Filled 2013-05-14: qty 1

## 2013-05-14 MED ORDER — CHOLESTYRAMINE 4 G PO PACK: 1.0000 | PACK | Freq: Two times a day (BID) | ORAL | Status: DC

## 2013-05-14 NOTE — ED Provider Notes (Signed)
CSN: [id number]     Arrival date & time 05/14/13  1939 History     First MD Initiated Contact with Patient 05/14/13 2004     Chief Complaint  Patient presents with  . Diarrhea  . Abdominal Pain   (Consider location/radiation/quality/duration/timing/severity/associated sxs/prior Treatment) HPI  Jamie [name] is a 77 y.o. female with PMH significant for metastatic Breast Cancer and NIDDM completing palliative radiation therapy to the right hip ~10 days ago complaining of abdominal pain worsening over the course of the last week associated with 15 episodes of diarrhea that is slightly blood-tinged (patient has hemorrhoids). Pt has been taking imodium and cholestyramine but feels that the diarrhea and pain are worsening. Associated symptoms of nausea and fatigue. Patient denies fever, chills, emesis, chest pain, shortness of breath, palpitations, lightheaded sensation. Patient has had recent antibiotic use however, C. difficile by PCR resulted negative yesterday. States her pain is crampy 8/10, diffuse but worse in the lower right quadrant and colicky in nature. Patient has been taking one half of a Percocet 1-2 times per day with little relief. Pt has been trying to stay hydrated drinking 2L of ginger ale and large bottle of Gatorade today.   Past Medical History  Diagnosis Date  . Anxiety   . Asthma   . Breast cancer     metastasized  . Depression   . Diabetes mellitus   . GERD (gastroesophageal reflux disease)   . Hyperlipidemia   . Internal hemorrhoids   . Hx of adenomatous colonic polyps   . IBS (irritable bowel syndrome)   . Mitral valve prolapse   . Ischemic colitis   . Fibromyalgia   . Allergy   . Cataract    Past Surgical History  Procedure Laterality Date  . Mastectomy      left  and then treated with chemo and radiation  . Total abdominal hysterectomy    . Back surgery      synovial cyst removed from spine  . Carpal tunnel release      bilateral  . Dilation and  curettage of uterus    . Appendectomy    . Cataract surgery Bilateral    Family History  Problem Relation Age of Onset  . Prostate cancer Brother     x2  . Irritable bowel syndrome Mother   . Colon cancer Neg Hx   . Stroke Father    History  Substance Use Topics  . Smoking status: Never Smoker   . Smokeless tobacco: Never Used  . Alcohol Use: No   OB History   Grav Para Term Preterm Abortions TAB SAB Ect Mult Living                 Review of Systems 10 systems reviewed and found to be negative, except as noted in the HPI   Allergies  Niacin and Promethazine hcl  Home Medications   Current Outpatient Rx  Name  Route  Sig  Dispense  Refill  . Alum Hydroxide-Mag Carbonate (GAVISCON PO)   Oral   Take 1 capsule by mouth daily.         Marland Kitchen azithromycin (ZITHROMAX) 1 G powder   Oral   Take 1 packet by mouth once. Take as directed, until completed         . beclomethasone (QVAR) 40 MCG/ACT inhaler   Inhalation   Inhale 2 puffs into the lungs at bedtime.         Marland Kitchen BYSTOLIC 5 MG tablet  Oral   Take 1 tablet by mouth Daily.         . Cholecalciferol (D3 ADULT) 1000 UNITS CHEW   Oral   Chew 1,000 Units by mouth daily.         . cholestyramine (QUESTRAN) 4 G packet   Oral   Take 1 packet by mouth 2 (two) times daily with a meal.   60 each   3   . cholestyramine light (PREVALITE) 4 G packet   Oral   Take 4 g by mouth 2 (two) times daily.         . clonazePAM (KLONOPIN) 0.5 MG tablet   Oral   Take 0.5 mg by mouth 2 (two) times daily.           . CRESTOR 5 MG tablet   Oral   Take 1 tablet by mouth Daily.         . fluconazole (DIFLUCAN) 150 MG tablet      as needed.          . fulvestrant (FASLODEX) 250 MG/5ML injection   Intramuscular   Inject 250 mg into the muscle every 30 (thirty) days. One injection each buttock over 1-2 minutes. Warm prior to use.         Marland Kitchen glimepiride (AMARYL) 1 MG tablet   Oral   Take 0.5 tablets by mouth  Daily.          . meclizine (ANTIVERT) 25 MG tablet   Oral   Take 1 tablet by mouth as needed. dizziness         . metFORMIN (GLUCOPHAGE-XR) 500 MG 24 hr tablet   Oral   Take 500 mg by mouth daily.         Marland Kitchen NASONEX 50 MCG/ACT nasal spray   Nasal   1 spray by Nasal route at bedtime.         Marland Kitchen NEXIUM 40 MG capsule   Oral   Take 1 tablet by mouth Daily.         . ondansetron (ZOFRAN ODT) 4 MG disintegrating tablet   Oral   Take 1 tablet (4 mg total) by mouth every 8 (eight) hours as needed for nausea.   10 tablet   0   . ONGLYZA 5 MG TABS tablet   Oral   Take 5 mg by mouth daily.          Marland Kitchen oxyCODONE-acetaminophen (PERCOCET/ROXICET) 5-325 MG per tablet   Oral   Take 1 tablet by mouth every 4 (four) hours as needed for pain.   50 tablet   0   . predniSONE (DELTASONE) 10 MG tablet   Oral   Take 10 mg by mouth daily. 10mg  bid x 10 days         . PROAIR HFA 108 (90 BASE) MCG/ACT inhaler               . metroNIDAZOLE (FLAGYL) 500 MG tablet   Oral   Take 1 tablet (500 mg total) by mouth 2 (two) times daily.   14 tablet   0    BP 141/73  Pulse 85  Temp(Src) 98.6 F (37 C) (Oral)  Resp 18  Ht 5\' 4"  (1.626 m)  Wt 154 lb (69.854 kg)  BMI 26.42 kg/m2  SpO2 94% Physical Exam  Nursing note and vitals reviewed. Constitutional: She is oriented to person, place, and time. She appears well-developed and well-nourished. No distress.  HENT:  Head: Normocephalic.  MMM  Eyes: Conjunctivae and EOM are normal. Pupils are equal, round, and reactive to light.  Neck: Normal range of motion.  Cardiovascular: Normal rate, regular rhythm and intact distal pulses.   Pulmonary/Chest: Effort normal and breath sounds normal. No stridor. No respiratory distress. She has no wheezes. She has no rales. She exhibits no tenderness.  Abdominal: Soft. She exhibits no distension and no mass. There is tenderness. There is no rebound and no guarding.  Mildly hyperactive bowel  sounds. +TTP of RLQ with no guarding or rebound.   Musculoskeletal: Normal range of motion. She exhibits no edema and no tenderness.  Neurological: She is alert and oriented to person, place, and time.  Psychiatric: She has a normal mood and affect.    ED Course   Procedures (including critical care time)  Labs Reviewed  URINALYSIS, ROUTINE W REFLEX MICROSCOPIC - Abnormal; Notable for the following:    Leukocytes, UA TRACE (*)    All other components within normal limits  COMPREHENSIVE METABOLIC PANEL - Abnormal; Notable for the following:    Potassium 3.2 (*)    Glucose, Bld 164 (*)    Albumin 3.2 (*)    Total Bilirubin 0.2 (*)    All other components within normal limits  CBC WITH DIFFERENTIAL - Abnormal; Notable for the following:    RBC 3.55 (*)    Hemoglobin 11.5 (*)    HCT 33.8 (*)    Lymphocytes Relative 6 (*)    Eosinophils Relative 26 (*)    Lymphs Abs 0.5 (*)    Eosinophils Absolute 2.3 (*)    All other components within normal limits  URINE MICROSCOPIC-ADD ON   Ct Abdomen Pelvis W Contrast  05/14/2013   *RADIOLOGY REPORT*  Clinical Data: Diarrhea, abdominal pain.  Abdominal cramping.  CT ABDOMEN AND PELVIS WITH CONTRAST  Technique:  Multidetector CT imaging of the abdomen and pelvis was performed following the standard protocol during bolus administration of intravenous contrast.  Contrast: OMNIPAQUE IOHEXOL 300 MG/ML  SOLN, 50mL OMNIPAQUE IOHEXOL 300 MG/ML  SOLN  Comparison: CT 08/24/2012.  MRI 04/01/2013.  Findings: Lung Bases: Lung bases appears similar to prior with nodular scarring in the right middle lobe and scattered areas of dependent atelectasis.  Left mastectomy is incidentally noted.  Liver:  Normal.  Spleen:  Normal.  Gallbladder:  Contracted.  No calcified stones or inflammatory changes.  Common bile duct:  Normal.  Pancreas:  Normal.  Adrenal glands:  Normal bilaterally.  Kidneys:  Normal enhancement and delayed excretion of contrast. Punctate  nonobstructing right renal collecting system calculi are present.  Left ureter can be followed to the urinary bladder appears normal.  Right ureter is also normal.  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  Stomach:  Normal.  Small bowel:  Normal.  No inflammatory changes or dilation.  The duodenum is within normal limits.  There is no mesenteric adenopathy.  Colon:   Normal appendix.  Nonspecific fluid levels are present within the colon, most commonly associated with enteric infection. This correlates with the clinical history of diarrhea.  Thickening of the distal sigmoid extending up to the rectum is present, compatible with colitis.  Diverticulosis without diverticulitis.  Pelvic Genitourinary:  Hysterectomy.  Urinary bladder is partially decompressed and appears within normal limits.  Bones:  Diffuse sclerotic osteoblastic metastases are present.  No pathologic fracture is identified.  Vasculature: Mild atherosclerosis is present.  Portal vein appears patent. Visceral vessels appear patent.  The IMA is patent.  Body Wall: Diffuse infiltration of the gluteal subcutaneous fat compatible with prior injections.  IMPRESSION:  1.  Findings compatible with colitis.  Sigmoid mural thickening with diffuse colonic air fluid levels. Be due to ischemic colitis, infection with inflammatory bowel disease considered unlikely in the absence of significant clinical history. 2.  Nonobstructing punctate right renal collecting system calculi with congenital right UPJ obstruction. 3.  Left mastectomy with sclerotic bone metastases.   Original Report Authenticated By: Andreas Newport, [name].    10:20 PM patient seen and examined at the bedside. She is resting comfortably. Abdominal exam remains nonsurgical.   Date: 05/15/2013  Rate: 86  Rhythm: normal sinus rhythm  QRS Axis: normal  Intervals: PR prolonged  ST/T Wave abnormalities: normal  Conduction Disutrbances:first-degree A-V block    Narrative Interpretation:   Old EKG Reviewed: changes noted a prolonged PR interval new from prior EKG in 01/12/2007.    1. Colitis   2. Breast cancer metastasized to bone, unspecified laterality   3. Hypokalemia     MDM  MDM Number of Diagnoses or Management Options  Filed Vitals:   05/14/13 1946  BP: 141/73  Pulse: 85  Temp: 98.6 F (37 C)  TempSrc: Oral  Resp: 18  Height: 5\' 4"  (1.626 m)  Weight: 154 lb (69.854 kg)  SpO2: 94%     IEASHA BOEREMA is a 77 y.o. female with metastatic breast cancer complaining of multiple episodes of watery diarrhea and diffuse abdominal pain. Abdominal exam is nonsurgical, patient appears clinically well-hydrated. Blood work is significant for a mild hypokalemia of 3.2. Patient will be given 40 mEq by mouth. Urinalysis shows normal specific gravity. No signs of infection. Pt pending CT abdomen pelvis. She is hesitant to take pain medications.   CT shows colitis, possible ischemic colitis. Lactate is normal at 1. Lactate ordered.   This is a shared visit with the attending physician who personally evaluated the patient and agrees with the care plan.   [facility] Dr. Evlyn Kanner consults and CT and lab results discussed. He will admit the patient.   Medications  potassium chloride SA (K-DUR,KLOR-CON) CR tablet 40 mEq (not administered)  sodium chloride 0.9 % bolus 1,000 mL (1,000 mLs Intravenous New Bag/Given 05/14/13 2108)  morphine 4 MG/ML injection 4 mg (4 mg Intravenous Given 05/14/13 2109)  ondansetron (ZOFRAN) injection 4 mg (4 mg Intravenous Given 05/14/13 2108)    Note: Portions of this report may have been transcribed using voice recognition software. Every effort was made to ensure accuracy; however, inadvertent computerized transcription errors may be present        Wynetta Emery, PA-C 05/15/13 [phone]

## 2013-05-14 NOTE — ED Notes (Signed)
Pt states she is having abd pain that started a week ago Saturday   Pt states she has had diarrrhea for the past week as well but today she has been going every 15 minutes or so   Pt states she was seen here this past Saturday for same  Pt states she has been undergoing radiation treatments that completed on Friday and then the pain started on that Saturday   Pt states she brought in a stool specimen on Sunday and they called and told her it came back ok  Pt states the pain is all over her abdomen and she feels like she is steadily getting worse

## 2013-05-15 ENCOUNTER — Ambulatory Visit: Payer: Medicare Other

## 2013-05-15 DIAGNOSIS — R197 Diarrhea, unspecified: Secondary | ICD-10-CM

## 2013-05-15 DIAGNOSIS — K625 Hemorrhage of anus and rectum: Secondary | ICD-10-CM

## 2013-05-15 DIAGNOSIS — E860 Dehydration: Secondary | ICD-10-CM

## 2013-05-15 DIAGNOSIS — R933 Abnormal findings on diagnostic imaging of other parts of digestive tract: Secondary | ICD-10-CM

## 2013-05-15 DIAGNOSIS — K5289 Other specified noninfective gastroenteritis and colitis: Secondary | ICD-10-CM

## 2013-05-15 LAB — GLUCOSE, CAPILLARY
Glucose-Capillary: 101 mg/dL — ABNORMAL HIGH (ref 70–99)
Glucose-Capillary: 100 mg/dL — ABNORMAL HIGH (ref 70–99)

## 2013-05-15 LAB — OCCULT BLOOD X 1 CARD TO LAB, STOOL: Fecal Occult Bld: POSITIVE — AB

## 2013-05-15 MED ORDER — PANTOPRAZOLE SODIUM 40 MG PO TBEC
40.0000 mg | DELAYED_RELEASE_TABLET | Freq: Every day | ORAL | Status: DC
  Administered 2013-05-15 – 2013-05-18 (×4): 40 mg via ORAL
  Filled 2013-05-15 (×5): qty 1

## 2013-05-15 MED ORDER — CLONAZEPAM 0.5 MG PO TABS
0.5000 mg | ORAL_TABLET | Freq: Two times a day (BID) | ORAL | Status: DC
  Administered 2013-05-15 – 2013-05-18 (×7): 0.5 mg via ORAL
  Filled 2013-05-15 (×7): qty 1

## 2013-05-15 MED ORDER — SODIUM CHLORIDE 0.9 % IV SOLN
INTRAVENOUS | Status: AC
  Administered 2013-05-15: 01:00:00 via INTRAVENOUS

## 2013-05-15 MED ORDER — LINAGLIPTIN 5 MG PO TABS
5.0000 mg | ORAL_TABLET | Freq: Every day | ORAL | Status: DC
  Administered 2013-05-15 – 2013-05-18 (×4): 5 mg via ORAL
  Filled 2013-05-15 (×4): qty 1

## 2013-05-15 MED ORDER — DEXTROSE-NACL 5-0.45 % IV SOLN
INTRAVENOUS | Status: DC
  Administered 2013-05-15: 50 mL/h via INTRAVENOUS
  Administered 2013-05-16: 22:00:00 via INTRAVENOUS

## 2013-05-15 MED ORDER — CHOLESTYRAMINE 4 G PO PACK
1.0000 | PACK | Freq: Two times a day (BID) | ORAL | Status: DC
  Administered 2013-05-15 – 2013-05-17 (×4): 1 via ORAL
  Filled 2013-05-15 (×6): qty 1

## 2013-05-15 MED ORDER — PREDNISONE 10 MG PO TABS
10.0000 mg | ORAL_TABLET | Freq: Every day | ORAL | Status: DC
  Administered 2013-05-16 – 2013-05-18 (×3): 10 mg via ORAL
  Filled 2013-05-15 (×4): qty 1

## 2013-05-15 MED ORDER — FLUTICASONE PROPIONATE 50 MCG/ACT NA SUSP
2.0000 | Freq: Every day | NASAL | Status: DC
  Administered 2013-05-16: 2 via NASAL
  Filled 2013-05-15: qty 16

## 2013-05-15 MED ORDER — OXYCODONE-ACETAMINOPHEN 5-325 MG PO TABS
0.5000 | ORAL_TABLET | ORAL | Status: DC | PRN
  Administered 2013-05-15 – 2013-05-16 (×5): 0.5 via ORAL
  Administered 2013-05-17: 1 via ORAL
  Filled 2013-05-15 (×6): qty 1

## 2013-05-15 MED ORDER — METRONIDAZOLE IN NACL 5-0.79 MG/ML-% IV SOLN
500.0000 mg | Freq: Three times a day (TID) | INTRAVENOUS | Status: AC
  Administered 2013-05-15 – 2013-05-17 (×7): 500 mg via INTRAVENOUS
  Filled 2013-05-15 (×7): qty 100

## 2013-05-15 MED ORDER — INSULIN ASPART 100 UNIT/ML ~~LOC~~ SOLN
0.0000 [IU] | Freq: Three times a day (TID) | SUBCUTANEOUS | Status: DC
  Administered 2013-05-15: 1 [IU] via SUBCUTANEOUS
  Administered 2013-05-16 (×2): 2 [IU] via SUBCUTANEOUS
  Administered 2013-05-16: 3 [IU] via SUBCUTANEOUS
  Administered 2013-05-17 (×2): 1 [IU] via SUBCUTANEOUS
  Administered 2013-05-17: 2 [IU] via SUBCUTANEOUS
  Administered 2013-05-18: 1 [IU] via SUBCUTANEOUS

## 2013-05-15 MED ORDER — LIDOCAINE-HYDROCORTISONE ACE 3-0.5 % RE CREA
1.0000 | TOPICAL_CREAM | Freq: Two times a day (BID) | RECTAL | Status: DC
  Filled 2013-05-15 (×18): qty 7

## 2013-05-15 MED ORDER — ONDANSETRON HCL 4 MG/2ML IJ SOLN: 4.0000 mg | Freq: Three times a day (TID) | INTRAMUSCULAR | Status: AC | PRN

## 2013-05-15 MED ORDER — LIDOCAINE-HYDROCORTISONE ACE 3-0.5 % RE CREA
1.0000 | TOPICAL_CREAM | Freq: Two times a day (BID) | RECTAL | Status: DC
  Administered 2013-05-15 – 2013-05-18 (×7): 1 via RECTAL
  Filled 2013-05-15: qty 7

## 2013-05-15 MED ORDER — NEBIVOLOL HCL 5 MG PO TABS
5.0000 mg | ORAL_TABLET | Freq: Every day | ORAL | Status: DC
  Administered 2013-05-15 – 2013-05-18 (×4): 5 mg via ORAL
  Filled 2013-05-15 (×4): qty 1

## 2013-05-15 MED ORDER — FLUTICASONE PROPIONATE HFA 44 MCG/ACT IN AERO
2.0000 | INHALATION_SPRAY | Freq: Two times a day (BID) | RESPIRATORY_TRACT | Status: DC
  Administered 2013-05-15 – 2013-05-18 (×7): 2 via RESPIRATORY_TRACT
  Filled 2013-05-15: qty 10.6

## 2013-05-15 MED ORDER — MECLIZINE HCL 25 MG PO TABS
25.0000 mg | ORAL_TABLET | Freq: Two times a day (BID) | ORAL | Status: DC | PRN
  Filled 2013-05-15: qty 1

## 2013-05-15 MED ORDER — HYDROMORPHONE HCL PF 1 MG/ML IJ SOLN: 1.0000 mg | INTRAMUSCULAR | Status: AC | PRN

## 2013-05-15 MED ORDER — CIPROFLOXACIN IN D5W 400 MG/200ML IV SOLN
400.0000 mg | Freq: Two times a day (BID) | INTRAVENOUS | Status: DC
  Administered 2013-05-15 – 2013-05-16 (×2): 400 mg via INTRAVENOUS
  Filled 2013-05-15 (×2): qty 200

## 2013-05-15 MED ORDER — DICYCLOMINE HCL 10 MG PO CAPS
10.0000 mg | ORAL_CAPSULE | Freq: Three times a day (TID) | ORAL | Status: DC
  Administered 2013-05-15 – 2013-05-16 (×4): 10 mg via ORAL
  Filled 2013-05-15 (×8): qty 1

## 2013-05-15 MED ORDER — ALBUTEROL SULFATE HFA 108 (90 BASE) MCG/ACT IN AERS
2.0000 | INHALATION_SPRAY | Freq: Four times a day (QID) | RESPIRATORY_TRACT | Status: DC | PRN
  Administered 2013-05-15: 2 via RESPIRATORY_TRACT
  Filled 2013-05-15: qty 6.7

## 2013-05-15 NOTE — H&P (Signed)
Jamie [name] is an 77 y.o. female.   PCP:   Gwen Pounds, MD   Chief Complaint:  Ab Pain, Bloating, Cramping, Diarrhea for 10 days.  HPI: 73 F currently getting radiation to hip and lower spine for metastatic breast cancer.  She has had a total of 12 radiation treatments, last treatment was 7/25. @ 7/14 she was treated with a z-pack and prednisone along wityh a breathing tx at her Asthma/Pulm Dr's office.  She started developing Diarrhea @ 2 weeks ago.  Started @ 1-3 bouts per day.  Initially controlled on Imodium. Stomach was rolling.  Sxs progressed and she presented to ED 7/26 c Ab Pain, Bloating, Cramping, and Diarrhea. Very little blood was noted. Imodium and Cholestyramine stopped working. Patient has been taking one half of a Percocet 1-2 times per day with little relief. No concurrent fever, confusion, disorientation, chest pain, dyspnea,syncope, vomiting, or anorexia. + fatigued. Cough and wheezes are better.  Patient felt substantially better and D/ced from the ED.  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 difficile colitis considered unlikely. Patient's labs were reassuring.  She had some improvement with Zofran. With no leukocytosis, fever, or peritoneal findings, CT imaging was felt not to be indicated at 7/26 ED Visit. Stool came back (-) for C dif, CMET and CBC were fine.  Her GI is Dr Lina Sar. I believe she was D/Ced on Flagyl empirically.  She came back to ED Yesterday and [facility] (despite trying to stay hydrated drinking 2L of ginger ale and large bottle of Gatorade), and continued Diarrhea.  CT Ab showed 1. Findings compatible with colitis. Sigmoid mural thickening with diffuse colonic air fluid levels. Be due to ischemic colitis, infection with inflammatory bowel disease considered unlikely in the absence of significant clinical history. 2. Nonobstructing punctate right renal collecting system calculi with congenital right UPJ obstruction. 3. Left  mastectomy with sclerotic bone metastases. Labs still unremarkable except for mild Hypokalemia.  She is now NPO x sips and chips, getting IVF, Getting IV Cipro/Flagyl.  Continue Sx control.  She c/o of continued Diarrhea - Several times a day, sometimes associated with Blood, + Bloating.  Food makes it worse.  Will Admit and get GI to see.    Past Medical History:  Past Medical History  Diagnosis Date  . Anxiety   . Asthma   . Breast cancer     metastasized  . Depression   . Diabetes mellitus   . GERD (gastroesophageal reflux disease)   . Hyperlipidemia   . Internal hemorrhoids   . Hx of adenomatous colonic polyps   . IBS (irritable bowel syndrome)   . Mitral valve prolapse   . Ischemic colitis   . Fibromyalgia   . Allergy   . Cataract     Past Surgical History  Procedure Laterality Date  . Mastectomy      left  and then treated with chemo and radiation  . Total abdominal hysterectomy    . Back surgery      synovial cyst removed from spine  . Carpal tunnel release      bilateral  . Dilation and curettage of uterus    . Appendectomy    . Cataract surgery Bilateral       Allergies:   Allergies  Allergen Reactions  . Niacin Other (See Comments)    Dizziness   . Promethazine Hcl Other (See Comments)    Dizziness "FELT CRAZY"  Medications: Prior to Admission medications   Medication Sig Start Date End Date Taking? Authorizing Provider  Alum Hydroxide-Mag Carbonate (GAVISCON PO) Take 1 capsule by mouth daily.   Yes Historical Provider, MD  azithromycin (ZITHROMAX) 1 G powder Take 1 packet by mouth once. Take as directed, until completed   Yes Historical Provider, MD  beclomethasone (QVAR) 40 MCG/ACT inhaler Inhale 2 puffs into the lungs at bedtime.   Yes Historical Provider, MD  BYSTOLIC 5 MG tablet Take 1 tablet by mouth Daily. 02/16/11  Yes Historical Provider, MD  Cholecalciferol (D3 ADULT) 1000 UNITS CHEW Chew 1,000 Units by mouth daily.   Yes Historical  Provider, MD  cholestyramine Lanetta Inch) 4 G packet Take 1 packet by mouth 2 (two) times daily with a meal. 05/14/13  Yes Lowella Dell, MD  cholestyramine light (PREVALITE) 4 G packet Take 4 g by mouth 2 (two) times daily.   Yes Historical Provider, MD  clonazePAM (KLONOPIN) 0.5 MG tablet Take 0.5 mg by mouth 2 (two) times daily.     Yes Historical Provider, MD  CRESTOR 5 MG tablet Take 1 tablet by mouth Daily. 02/05/11  Yes Historical Provider, MD  fluconazole (DIFLUCAN) 150 MG tablet as needed.  02/02/12  Yes Historical Provider, MD  fulvestrant (FASLODEX) 250 MG/5ML injection Inject 250 mg into the muscle every 30 (thirty) days. One injection each buttock over 1-2 minutes. Warm prior to use.   Yes Historical Provider, MD  glimepiride (AMARYL) 1 MG tablet Take 0.5 tablets by mouth Daily.  02/05/11  Yes Historical Provider, MD  meclizine (ANTIVERT) 25 MG tablet Take 1 tablet by mouth as needed. dizziness 12/13/10  Yes Historical Provider, MD  metFORMIN (GLUCOPHAGE-XR) 500 MG 24 hr tablet Take 500 mg by mouth daily.   Yes Historical Provider, MD  NASONEX 50 MCG/ACT nasal spray 1 spray by Nasal route at bedtime. 02/01/11  Yes Historical Provider, MD  NEXIUM 40 MG capsule Take 1 tablet by mouth Daily. 03/03/11  Yes Historical Provider, MD  ondansetron (ZOFRAN ODT) 4 MG disintegrating tablet Take 1 tablet (4 mg total) by mouth every 8 (eight) hours as needed for nausea. 05/12/13  Yes Gerhard Munch, MD  ONGLYZA 5 MG TABS tablet Take 5 mg by mouth daily.  02/24/12  Yes Historical Provider, MD  oxyCODONE-acetaminophen (PERCOCET/ROXICET) 5-325 MG per tablet Take 1 tablet by mouth every 4 (four) hours as needed for pain. 05/08/13  Yes Lurline Hare, MD  predniSONE (DELTASONE) 10 MG tablet Take 10 mg by mouth daily. 10mg  bid x 10 days 04/30/13  Yes   PROAIR HFA 108 (90 BASE) MCG/ACT inhaler  05/30/12  Yes Historical Provider, MD  metroNIDAZOLE (FLAGYL) 500 MG tablet Take 1 tablet (500 mg total) by mouth 2 (two)  times daily. 05/12/13   Gerhard Munch, MD     Medications Prior to Admission  Medication Sig Dispense Refill  . Alum Hydroxide-Mag Carbonate (GAVISCON PO) Take 1 capsule by mouth daily.      Marland Kitchen azithromycin (ZITHROMAX) 1 G powder Take 1 packet by mouth once. Take as directed, until completed      . beclomethasone (QVAR) 40 MCG/ACT inhaler Inhale 2 puffs into the lungs at bedtime.      Marland Kitchen BYSTOLIC 5 MG tablet Take 1 tablet by mouth Daily.      . Cholecalciferol (D3 ADULT) 1000 UNITS CHEW Chew 1,000 Units by mouth daily.      . cholestyramine (QUESTRAN) 4 G packet Take 1 packet by mouth 2 (two) times  daily with a meal.  60 each  3  . cholestyramine light (PREVALITE) 4 G packet Take 4 g by mouth 2 (two) times daily.      . clonazePAM (KLONOPIN) 0.5 MG tablet Take 0.5 mg by mouth 2 (two) times daily.        . CRESTOR 5 MG tablet Take 1 tablet by mouth Daily.      . fluconazole (DIFLUCAN) 150 MG tablet as needed.       . fulvestrant (FASLODEX) 250 MG/5ML injection Inject 250 mg into the muscle every 30 (thirty) days. One injection each buttock over 1-2 minutes. Warm prior to use.      Marland Kitchen glimepiride (AMARYL) 1 MG tablet Take 0.5 tablets by mouth Daily.       . meclizine (ANTIVERT) 25 MG tablet Take 1 tablet by mouth as needed. dizziness      . metFORMIN (GLUCOPHAGE-XR) 500 MG 24 hr tablet Take 500 mg by mouth daily.      Marland Kitchen NASONEX 50 MCG/ACT nasal spray 1 spray by Nasal route at bedtime.      Marland Kitchen NEXIUM 40 MG capsule Take 1 tablet by mouth Daily.      . ondansetron (ZOFRAN ODT) 4 MG disintegrating tablet Take 1 tablet (4 mg total) by mouth every 8 (eight) hours as needed for nausea.  10 tablet  0  . ONGLYZA 5 MG TABS tablet Take 5 mg by mouth daily.       Marland Kitchen oxyCODONE-acetaminophen (PERCOCET/ROXICET) 5-325 MG per tablet Take 1 tablet by mouth every 4 (four) hours as needed for pain.  50 tablet  0  . predniSONE (DELTASONE) 10 MG tablet Take 10 mg by mouth daily. 10mg  bid x 10 days      . PROAIR HFA  108 (90 BASE) MCG/ACT inhaler       . metroNIDAZOLE (FLAGYL) 500 MG tablet Take 1 tablet (500 mg total) by mouth 2 (two) times daily.  14 tablet  0     Social History:  reports that she has never smoked. She has never used smokeless tobacco. She reports that she does not drink alcohol or use illicit drugs.  Family History: Family History  Problem Relation Age of Onset  . Prostate cancer Brother     x2  . Irritable bowel syndrome Mother   . Colon cancer Neg Hx   . Stroke Father     Review of Systems:  Review of Systems - See HPI.  + Diarrhea.  Bone pain.   Physical Exam:  Blood pressure 112/47, pulse 71, temperature 98 F (36.7 C), temperature source Oral, resp. rate 18, height 5\' 4"  (1.626 m), weight 69.219 kg (152 lb 9.6 oz), SpO2 96.00%. Filed Vitals:   05/15/13 0035 05/15/13 0155 05/15/13 0200 05/15/13 0601  BP: 154/59 143/57  112/47  Pulse: 89 84  71  Temp:  98.3 F (36.8 C)  98 F (36.7 C)  TempSrc:  Oral  Oral  Resp: 17 18  18   Height:   5\' 4"  (1.626 m)   Weight:  69.219 kg (152 lb 9.6 oz)    SpO2: 93% 96%  96%   General appearance: Looks non Toxic and fine sitting in bed Head: Normocephalic, without obvious abnormality, atraumatic Eyes: conjunctivae/corneas clear. PERRL, EOM's intact.  Nose: Nares normal. Septum midline. Mucosa normal. No drainage or sinus tenderness. Throat: lips, mucosa, and tongue normal; teeth and gums normal Neck: no adenopathy, no carotid bruit, no JVD and thyroid not enlarged, symmetric, no tenderness/mass/nodules  Resp: CTA B Cardio: Reg GI: Bloating, Min-tender diffuse, Hyperactive BS.  No Reb/Guarding. Extremities: extremities normal, atraumatic, no cyanosis or edema Pulses: 2+ and symmetric Lymph nodes: No cervical lymphadenopathy Neurologic: Alert and oriented X 3, normal strength and tone. Normal symmetric reflexes.     Labs on Admission:   Recent Labs  05/12/13 1432 05/14/13 2113  NA 140 140  K 4.3 3.2*  CL 105 104   CO2 29 28  GLUCOSE 119* 164*  BUN 10 6  CREATININE 0.54 0.54  CALCIUM 8.9 9.0    Recent Labs  05/12/13 1432 05/14/13 2113  AST 13 14  ALT 11 10  ALKPHOS 64 57  BILITOT 0.2* 0.2*  PROT 6.2 6.2  ALBUMIN 3.2* 3.2*    Recent Labs  05/12/13 1432  LIPASE 21    Recent Labs  05/12/13 1432 05/14/13 2113  WBC 6.7 8.8  NEUTROABS 4.2 5.4  HGB 11.9* 11.5*  HCT 35.2* 33.8*  MCV 95.7 95.2  PLT 153 154   No results found for this basename: CKTOTAL, CKMB, CKMBINDEX, TROPONINI,  in the last 72 hours No results found for this basename: INR,  PROTIME     LAB RESULT POCT:  Results for orders placed during the hospital encounter of 05/14/13  URINALYSIS, ROUTINE W REFLEX MICROSCOPIC      Result Value Range   Color, Urine YELLOW  YELLOW   APPearance CLEAR  CLEAR   Specific Gravity, Urine 1.010  1.005 - 1.030   pH 6.0  5.0 - 8.0   Glucose, UA NEGATIVE  NEGATIVE mg/dL   Hgb urine dipstick NEGATIVE  NEGATIVE   Bilirubin Urine NEGATIVE  NEGATIVE   Ketones, ur NEGATIVE  NEGATIVE mg/dL   Protein, ur NEGATIVE  NEGATIVE mg/dL   Urobilinogen, UA 0.2  0.0 - 1.0 mg/dL   Nitrite NEGATIVE  NEGATIVE   Leukocytes, UA TRACE (*) NEGATIVE  COMPREHENSIVE METABOLIC PANEL      Result Value Range   Sodium 140  135 - 145 mEq/L   Potassium 3.2 (*) 3.5 - 5.1 mEq/L   Chloride 104  96 - 112 mEq/L   CO2 28  19 - 32 mEq/L   Glucose, Bld 164 (*) 70 - 99 mg/dL   BUN 6  6 - 23 mg/dL   Creatinine, Ser 1.61  0.50 - 1.10 mg/dL   Calcium 9.0  8.4 - 09.6 mg/dL   Total Protein 6.2  6.0 - 8.3 g/dL   Albumin 3.2 (*) 3.5 - 5.2 g/dL   AST 14  0 - 37 U/L   ALT 10  0 - 35 U/L   Alkaline Phosphatase 57  39 - 117 U/L   Total Bilirubin 0.2 (*) 0.3 - 1.2 mg/dL   GFR calc non Af Amer >90  >90 mL/min   GFR calc Af Amer >90  >90 mL/min  CBC WITH DIFFERENTIAL      Result Value Range   WBC 8.8  4.0 - 10.5 K/uL   RBC 3.55 (*) 3.87 - 5.11 MIL/uL   Hemoglobin 11.5 (*) 12.0 - 15.0 g/dL   HCT 04.5 (*) 40.9 - 81.1 %    MCV 95.2  78.0 - 100.0 fL   MCH 32.4  26.0 - 34.0 pg   MCHC 34.0  30.0 - 36.0 g/dL   RDW 91.4  78.2 - 95.6 %   Platelets 154  150 - 400 K/uL   Neutrophils Relative % 61  43 - 77 %   Lymphocytes Relative 6 (*) 12 -  46 %   Monocytes Relative 7  3 - 12 %   Eosinophils Relative 26 (*) 0 - 5 %   Basophils Relative 0  0 - 1 %   Neutro Abs 5.4  1.7 - 7.7 K/uL   Lymphs Abs 0.5 (*) 0.7 - 4.0 K/uL   Monocytes Absolute 0.6  0.1 - 1.0 K/uL   Eosinophils Absolute 2.3 (*) 0.0 - 0.7 K/uL   Basophils Absolute 0.0  0.0 - 0.1 K/uL   Smear Review MORPHOLOGY UNREMARKABLE    URINE MICROSCOPIC-ADD ON      Result Value Range   Squamous Epithelial / LPF RARE  RARE   WBC, UA 0-2  <3 WBC/hpf  GLUCOSE, CAPILLARY      Result Value Range   Glucose-Capillary 101 (*) 70 - 99 mg/dL  GLUCOSE, CAPILLARY      Result Value Range   Glucose-Capillary 103 (*) 70 - 99 mg/dL  CG4 I-STAT (LACTIC ACID)      Result Value Range   Lactic Acid, Venous 1.04  0.5 - 2.2 mmol/L      Radiological Exams on Admission: Ct Abdomen Pelvis W Contrast  05/14/2013   *RADIOLOGY REPORT*  Clinical Data: Diarrhea, abdominal pain.  Abdominal cramping.  CT ABDOMEN AND PELVIS WITH CONTRAST  Technique:  Multidetector CT imaging of the abdomen and pelvis was performed following the standard protocol during bolus administration of intravenous contrast.  Contrast: OMNIPAQUE IOHEXOL 300 MG/ML  SOLN, 50mL OMNIPAQUE IOHEXOL 300 MG/ML  SOLN  Comparison: CT 08/24/2012.  MRI 04/01/2013.  Findings: Lung Bases: Lung bases appears similar to prior with nodular scarring in the right middle lobe and scattered areas of dependent atelectasis.  Left mastectomy is incidentally noted.  Liver:  Normal.  Spleen:  Normal.  Gallbladder:  Contracted.  No calcified stones or inflammatory changes.  Common bile duct:  Normal.  Pancreas:  Normal.  Adrenal glands:  Normal bilaterally.  Kidneys:  Normal enhancement and delayed excretion of contrast. Punctate  nonobstructing right renal collecting system calculi are present.  Left ureter can be followed to the urinary bladder appears normal.  Right ureter is also normal.  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  Stomach:  Normal.  Small bowel:  Normal.  No inflammatory changes or dilation.  The duodenum is within normal limits.  There is no mesenteric adenopathy.  Colon:   Normal appendix.  Nonspecific fluid levels are present within the colon, most commonly associated with enteric infection. This correlates with the clinical history of diarrhea.  Thickening of the distal sigmoid extending up to the rectum is present, compatible with colitis.  Diverticulosis without diverticulitis.  Pelvic Genitourinary:  Hysterectomy.  Urinary bladder is partially decompressed and appears within normal limits.  Bones:  Diffuse sclerotic osteoblastic metastases are present.  No pathologic fracture is identified.  Vasculature: Mild atherosclerosis is present.  Portal vein appears patent. Visceral vessels appear patent.  The IMA is patent.  Body Wall: Diffuse infiltration of the gluteal subcutaneous fat compatible with prior injections.  IMPRESSION:  1.  Findings compatible with colitis.  Sigmoid mural thickening with diffuse colonic air fluid levels. Be due to ischemic colitis, infection with inflammatory bowel disease considered unlikely in the absence of significant clinical history. 2.  Nonobstructing punctate right renal collecting system calculi with congenital right UPJ obstruction. 3.  Left mastectomy with sclerotic bone metastases.   Original Report Authenticated By: Andreas Newport, [name].      Orders placed during the hospital  encounter of 05/14/13  . EKG 12-LEAD  . EKG 12-LEAD  . EKG 12-LEAD  . EKG 12-LEAD     Assessment/Plan Active Problems:   History of ischemic colitis   Diabetes mellitus type 2, noninsulin dependent   Hypertension   Fibromyalgia   Breast cancer metastasized to  bone   Other and unspecified noninfectious gastroenteritis and colitis(558.9)   Diarrhea     1. Colitis - Admit.  IVF for [facility]. NPO x Sips/Chips.  Cipro/Flagyl empirically.  Radiatiation Proctitis vrs Iscemic colitis vrs Infectios vrs other - Will get GI to Weigh in. Sx management. Ab exam is non-surgical at this time. Lactate 1.04 is fine. 2. Breast cancer metastasized to bone - S/P XRT 3. Hypokalemia - Replete. 4. Anxiety/Depression/panic - on meds. 5. DM - ISS. Hold Metfomin with Diarrhea. 6.Lipids - OK to hold Crestor 7. Fibromyalgia 8. DVT Proph. 9. Asthma - Q Var 10 GERD - PPI    Francyne Arreaga M 05/15/2013, 8:08 AM

## 2013-05-15 NOTE — Progress Notes (Signed)
Patients CBG this pm is 79, pt is NPO except ice chips and she is very concern that her blood sugar will drop at night time. Called answering service for Dr. Timothy Lasso. Able to speak with Dr. Wylene Simmer and gave orders.

## 2013-05-15 NOTE — ED Notes (Signed)
I-stat CG4 given to Dr Rhunette Croft.

## 2013-05-15 NOTE — Consult Note (Signed)
[facility] Consultation  Referring Provider:    [name], MD Primary Care Physician:  Gwen Pounds, MD Primary Gastroenterologist:  Lina Sar, MD Reason for Consultation:  Diarrhea, colitis by CTscan             HPI:   Jamie [name] is a 77 y.o. female with metastatic breast cancer to the bone  She is known to Dr. [name] for history of adenomatous colon polyps and GERD. She was last seen in February of this year for complaints of persistent bloating, nausea, and upper abdominal discomfort on twice daily PPI. She was switched to a different PPI , scheduled for upper endoscopy and gastric emptying study. As far as I can tell, EGD and gastric emptying were canceled due to the family illness.    Patient has been getting radiation to her hip and spine for metastatic disease, last treatment was 05/11/13. She was admitted to hospital early this am with a 2 week history of crampy diarrhea. Over the last two weeks she has noticed tiny spots of blood on tissue with wiping. No significant bleeding until this am when she has what sounds like explosive gas with blood. Her C-diff is negative. WBC normal. CTscan reveals sigmoid colitis. She complains of hemorrhoidal pain.  Past Medical History  Diagnosis Date  . Anxiety   . Asthma   . Breast cancer     metastasized  . Depression   . Diabetes mellitus   . GERD (gastroesophageal reflux disease)   . Hyperlipidemia   . Internal hemorrhoids   . Hx of adenomatous colonic polyps   . IBS (irritable bowel syndrome)   . Mitral valve prolapse   . Ischemic colitis   . Fibromyalgia   . Allergy   . Cataract     Past Surgical History  Procedure Laterality Date  . Mastectomy      left  and then treated with chemo and radiation  . Total abdominal hysterectomy    . Back surgery      synovial cyst removed from spine  . Carpal tunnel release      bilateral  . Dilation and curettage of uterus    . Appendectomy    . Cataract surgery  Bilateral     Family History  Problem Relation Age of Onset  . Prostate cancer Brother     x2  . Irritable bowel syndrome Mother   . Colon cancer Neg Hx   . Stroke Father      History  Substance Use Topics  . Smoking status: Never Smoker   . Smokeless tobacco: Never Used  . Alcohol Use: No    Prior to Admission medications   Medication Sig Start Date End Date Taking? Authorizing Provider  Alum Hydroxide-Mag Carbonate (GAVISCON PO) Take 1 capsule by mouth daily.   Yes Historical Provider, MD  azithromycin (ZITHROMAX) 1 G powder Take 1 packet by mouth once. Take as directed, until completed   Yes Historical Provider, MD  beclomethasone (QVAR) 40 MCG/ACT inhaler Inhale 2 puffs into the lungs at bedtime.   Yes Historical Provider, MD  BYSTOLIC 5 MG tablet Take 1 tablet by mouth Daily. 02/16/11  Yes Historical Provider, MD  Cholecalciferol (D3 ADULT) 1000 UNITS CHEW Chew 1,000 Units by mouth daily.   Yes Historical Provider, MD  cholestyramine Lanetta Inch) 4 G packet Take 1 packet by mouth 2 (two) times daily with a meal. 05/14/13  Yes Lowella Dell, MD  cholestyramine light (PREVALITE) 4 G packet Take 4  g by mouth 2 (two) times daily.   Yes Historical Provider, MD  clonazePAM (KLONOPIN) 0.5 MG tablet Take 0.5 mg by mouth 2 (two) times daily.     Yes Historical Provider, MD  CRESTOR 5 MG tablet Take 1 tablet by mouth Daily. 02/05/11  Yes Historical Provider, MD  fluconazole (DIFLUCAN) 150 MG tablet as needed.  02/02/12  Yes Historical Provider, MD  fulvestrant (FASLODEX) 250 MG/5ML injection Inject 250 mg into the muscle every 30 (thirty) days. One injection each buttock over 1-2 minutes. Warm prior to use.   Yes Historical Provider, MD  glimepiride (AMARYL) 1 MG tablet Take 0.5 tablets by mouth Daily.  02/05/11  Yes Historical Provider, MD  meclizine (ANTIVERT) 25 MG tablet Take 1 tablet by mouth as needed. dizziness 12/13/10  Yes Historical Provider, MD  metFORMIN (GLUCOPHAGE-XR) 500 MG 24  hr tablet Take 500 mg by mouth daily.   Yes Historical Provider, MD  NASONEX 50 MCG/ACT nasal spray 1 spray by Nasal route at bedtime. 02/01/11  Yes Historical Provider, MD  NEXIUM 40 MG capsule Take 1 tablet by mouth Daily. 03/03/11  Yes Historical Provider, MD  ondansetron (ZOFRAN ODT) 4 MG disintegrating tablet Take 1 tablet (4 mg total) by mouth every 8 (eight) hours as needed for nausea. 05/12/13  Yes Gerhard Munch, MD  ONGLYZA 5 MG TABS tablet Take 5 mg by mouth daily.  02/24/12  Yes Historical Provider, MD  oxyCODONE-acetaminophen (PERCOCET/ROXICET) 5-325 MG per tablet Take 1 tablet by mouth every 4 (four) hours as needed for pain. 05/08/13  Yes Lurline Hare, MD  predniSONE (DELTASONE) 10 MG tablet Take 10 mg by mouth daily. 10mg  bid x 10 days 04/30/13  Yes   PROAIR HFA 108 (90 BASE) MCG/ACT inhaler  05/30/12  Yes Historical Provider, MD  metroNIDAZOLE (FLAGYL) 500 MG tablet Take 1 tablet (500 mg total) by mouth 2 (two) times daily. 05/12/13   Gerhard Munch, MD    Current Facility-Administered Medications  Medication Dose Route Frequency Provider Last Rate Last Dose  . 0.9 %  sodium chloride infusion   Intravenous STAT Nicole Pisciotta, PA-C 100 mL/hr at 05/15/13 0052    . albuterol (PROVENTIL HFA;VENTOLIN HFA) 108 (90 BASE) MCG/ACT inhaler 2 puff  2 puff Inhalation Q6H PRN Gwen Pounds, MD      . cholestyramine Lanetta Inch) packet 1 packet  1 packet Oral BID WC Gwen Pounds, MD      . ciprofloxacin (CIPRO) IVPB 400 mg  400 mg Intravenous Q12H Gwen Pounds, MD      . clonazePAM Scarlette Calico) tablet 0.5 mg  0.5 mg Oral BID Gwen Pounds, MD   0.5 mg at 05/15/13 0949  . fluticasone (FLONASE) 50 MCG/ACT nasal spray 2 spray  2 spray Each Nare Daily Gwen Pounds, MD      . fluticasone (FLOVENT HFA) 44 MCG/ACT inhaler 2 puff  2 puff Inhalation BID Gwen Pounds, MD      . HYDROmorphone (DILAUDID) injection 1 mg  1 mg Intravenous Q4H PRN Nicole Pisciotta, PA-C      . insulin aspart (novoLOG) injection  0-9 Units  0-9 Units Subcutaneous TID WC Gwen Pounds, MD      . linagliptin (TRADJENTA) tablet 5 mg  5 mg Oral Daily Gwen Pounds, MD   5 mg at 05/15/13 0949  . meclizine (ANTIVERT) tablet 25 mg  25 mg Oral BID PRN Gwen Pounds, MD      . metroNIDAZOLE (FLAGYL) IVPB  500 mg  500 mg Intravenous Q8H Gwen Pounds, MD   500 mg at 05/15/13 0949  . nebivolol (BYSTOLIC) tablet 5 mg  5 mg Oral Daily Gwen Pounds, MD   5 mg at 05/15/13 0949  . ondansetron (ZOFRAN) injection 4 mg  4 mg Intravenous Q8H PRN Nicole Pisciotta, PA-C      . oxyCODONE-acetaminophen (PERCOCET/ROXICET) 5-325 MG per tablet 0.5-1 tablet  0.5-1 tablet Oral Q4H PRN Julian Hy, MD   0.5 tablet at 05/15/13 0846  . pantoprazole (PROTONIX) EC tablet 40 mg  40 mg Oral Daily Gwen Pounds, MD   40 mg at 05/15/13 0949  . predniSONE (DELTASONE) tablet 10 mg  10 mg Oral Daily Gwen Pounds, MD        Allergies as of 05/14/2013 - Review Complete 05/14/2013  Allergen Reaction Noted  . Niacin Other (See Comments) 08/20/2008  . Promethazine hcl Other (See Comments) 03/05/2008   Review of Systems:    All systems reviewed and negative except where noted in HPI.    Physical Exam:  Vital signs in last 24 hours: Temp:  [98 F (36.7 C)-98.6 F (37 C)] 98 F (36.7 C) (07/29 0601) Pulse Rate:  [71-89] 71 (07/29 0601) Resp:  [17-18] 18 (07/29 0601) BP: (112-154)/(47-73) 112/47 mmHg (07/29 0601) SpO2:  [93 %-96 %] 96 % (07/29 0601) Weight:  [152 lb 9.6 oz (69.219 kg)-154 lb (69.854 kg)] 152 lb 9.6 oz (69.219 kg) (07/29 0155) Last BM Date: 05/15/13 General:   Pleasant white female in NAD Head:  Normocephalic and atraumatic. Eyes:   No icterus.   Conjunctiva pink. Ears:  Normal auditory acuity. Neck:  Supple; no masses felt Lungs:  Respirations even and unlabored. Lungs clear to auscultation bilaterally.   No wheezes, crackles, or rhonchi.  Heart:  Regular rate and rhythm Abdomen:  Soft, nondistended, mild LLQ tenderness. Normal bowel  sounds. No appreciable masses or hepatomegaly.  Rectal:  Tender external hemorrhoid, on DRE there is minimal blood and what feels like internal hemorrhoids Rectal:  Not performed.  Msk:  Symmetrical without gross deformities.  Extremities:  Without edema. Neurologic:  Alert and  oriented x4;  grossly normal neurologically. Skin:  Intact without significant lesions or rashes. Cervical Nodes:  No significant cervical adenopathy. Psych:  Alert and cooperative. Normal affect.  LAB RESULTS:  Recent Labs  05/12/13 1432 05/14/13 2113  WBC 6.7 8.8  HGB 11.9* 11.5*  HCT 35.2* 33.8*  PLT 153 154   BMET  Recent Labs  05/12/13 1432 05/14/13 2113  NA 140 140  K 4.3 3.2*  CL 105 104  CO2 29 28  GLUCOSE 119* 164*  BUN 10 6  CREATININE 0.54 0.54  CALCIUM 8.9 9.0   LFT  Recent Labs  05/14/13 2113  PROT 6.2  ALBUMIN 3.2*  AST 14  ALT 10  ALKPHOS 57  BILITOT 0.2*   STUDIES: Ct Abdomen Pelvis W Contrast  05/14/2013   *RADIOLOGY REPORT*  Clinical Data: Diarrhea, abdominal pain.  Abdominal cramping.  CT ABDOMEN AND PELVIS WITH CONTRAST  Technique:  Multidetector CT imaging of the abdomen and pelvis was performed following the standard protocol during bolus administration of intravenous contrast.  Contrast: OMNIPAQUE IOHEXOL 300 MG/ML  SOLN, 50mL OMNIPAQUE IOHEXOL 300 MG/ML  SOLN  Comparison: CT 08/24/2012.  MRI 04/01/2013.  Findings: Lung Bases: Lung bases appears similar to prior with nodular scarring in the right middle lobe and scattered areas of dependent atelectasis.  Left mastectomy is  incidentally noted.  Liver:  Normal.  Spleen:  Normal.  Gallbladder:  Contracted.  No calcified stones or inflammatory changes.  Common bile duct:  Normal.  Pancreas:  Normal.  Adrenal glands:  Normal bilaterally.  Kidneys:  Normal enhancement and delayed excretion of contrast. Punctate nonobstructing right renal collecting system calculi are present.  Left ureter can be followed to the urinary  bladder appears normal.  Right ureter is also normal.  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  Stomach:  Normal.  Small bowel:  Normal.  No inflammatory changes or dilation.  The duodenum is within normal limits.  There is no mesenteric adenopathy.  Colon:   Normal appendix.  Nonspecific fluid levels are present within the colon, most commonly associated with enteric infection. This correlates with the clinical history of diarrhea.  Thickening of the distal sigmoid extending up to the rectum is present, compatible with colitis.  Diverticulosis without diverticulitis.  Pelvic Genitourinary:  Hysterectomy.  Urinary bladder is partially decompressed and appears within normal limits.  Bones:  Diffuse sclerotic osteoblastic metastases are present.  No pathologic fracture is identified.  Vasculature: Mild atherosclerosis is present.  Portal vein appears patent. Visceral vessels appear patent.  The IMA is patent.  Body Wall: Diffuse infiltration of the gluteal subcutaneous fat compatible with prior injections.  IMPRESSION:  1.  Findings compatible with colitis.  Sigmoid mural thickening with diffuse colonic air fluid levels. Be due to ischemic colitis, infection with inflammatory bowel disease considered unlikely in the absence of significant clinical history. 2.  Nonobstructing punctate right renal collecting system calculi with congenital right UPJ obstruction. 3.  Left mastectomy with sclerotic bone metastases.   Original Report Authenticated By: Andreas Newport, [name].   PREVIOUS ENDOSCOPIES:            Colonoscopy July 2013 her history of colon polyps and constipation. Findings included a tiny polyp and internal hemorrhoids   Impression / Plan:   28. 77 year old female admitted with a two week history of crampy diarrhea. CTscan c/w sigmoid colitis. Diarrhea associated with minimal rectal bleeding though today it sounds like bleeding was a little heavier. Infectious colitis  doubtful. Ischemic colitis not excluded but would favor radiation induced colitis with hemorrhoidal bleeding. Will treat hemorrhoids.Trial of Bentyl for abdominal cramps. Will consider ASA enemas.   2. Metastatic breast cancer, on Faslodex and recently completed spine / hip radiation.     LOS: 1 day   Willette Cluster  05/15/2013, 10:02 AM  GI ATTENDING  History, laboratories, x-rays, and prior endoscopy reports reviewed. The patient was personally seen and examined. Her husband was in the room. She reports 2 weeks of cramping abdominal discomfort with loose bowels. Some minimal rectal bleeding. Just finished radiation therapy for hip and spine metastases. CT scan shows evidence of focal sigmoid colitis (mild). Colonoscopy 1 year ago essentially unremarkable. Stool studies for C. difficile negative. I suspect that she may have radiation enteritis. Alternatively she could have mild ischemic colitis. Either of these entities should resolve with supportive care and time. Less likely, and anxiety, would be that she developed inflammatory bowel disease. If symptoms don't improve with appropriate conservative measures and reasonable timeframe, then reevaluation of colon with colonoscopy would be reasonable. Discussed with patient and husband. We'll follow. Thank you.  Wilhemina Bonito. Eda Keys., [name]. [facility] Division of Gastroenterology

## 2013-05-16 ENCOUNTER — Ambulatory Visit: Payer: Medicare Other

## 2013-05-16 ENCOUNTER — Inpatient Hospital Stay (HOSPITAL_COMMUNITY): Payer: Medicare Other

## 2013-05-16 ENCOUNTER — Other Ambulatory Visit (HOSPITAL_COMMUNITY): Payer: Self-pay | Admitting: Oncology

## 2013-05-16 DIAGNOSIS — R109 Unspecified abdominal pain: Secondary | ICD-10-CM

## 2013-05-16 LAB — GLUCOSE, CAPILLARY
Glucose-Capillary: 153 mg/dL — ABNORMAL HIGH (ref 70–99)
Glucose-Capillary: 168 mg/dL — ABNORMAL HIGH (ref 70–99)
Glucose-Capillary: 136 mg/dL — ABNORMAL HIGH (ref 70–99)

## 2013-05-16 LAB — BASIC METABOLIC PANEL
BUN: 3 mg/dL — ABNORMAL LOW (ref 6–23)
GFR calc non Af Amer: 88 mL/min — ABNORMAL LOW (ref >90)
Glucose, Bld: 120 mg/dL — ABNORMAL HIGH (ref 70–99)
Potassium: 3.2 mEq/L — ABNORMAL LOW (ref 3.5–5.1)

## 2013-05-16 LAB — CBC
HCT: 32.5 % — ABNORMAL LOW (ref 36.0–46.0)
Hemoglobin: 11.1 g/dL — ABNORMAL LOW (ref 12.0–15.0)
MCH: 32.6 pg (ref 26.0–34.0)
MCHC: 34.2 g/dL (ref 30.0–36.0)

## 2013-05-16 MED ORDER — LOPERAMIDE HCL 2 MG PO CAPS
2.0000 mg | ORAL_CAPSULE | Freq: Two times a day (BID) | ORAL | Status: DC
  Administered 2013-05-16 (×2): 2 mg via ORAL
  Filled 2013-05-16 (×4): qty 1

## 2013-05-16 MED ORDER — DICYCLOMINE HCL 10 MG PO CAPS
20.0000 mg | ORAL_CAPSULE | Freq: Three times a day (TID) | ORAL | Status: DC
  Administered 2013-05-16 – 2013-05-18 (×7): 20 mg via ORAL
  Filled 2013-05-16 (×9): qty 2

## 2013-05-16 MED ORDER — POTASSIUM CHLORIDE CRYS ER 20 MEQ PO TBCR
20.0000 meq | EXTENDED_RELEASE_TABLET | Freq: Two times a day (BID) | ORAL | Status: DC
  Administered 2013-05-16 – 2013-05-18 (×5): 20 meq via ORAL
  Filled 2013-05-16 (×6): qty 1

## 2013-05-16 NOTE — Consult Note (Signed)
ID: Jamie [name]   DOB: 1936/06/02  MR#: [id number]  WUJ#[id number]    PCP: Jamie Pounds, MD GYN: Jamie [name] SU: OTHER MD: Jamie [name], Jamie [name]   HISTORY OF PRESENT ILLNESS: The patient's cancer was uncovered by an unusually circuitous route. She presented with stomach upset.  Because of a history of colitis, Dr. Dorena [name] who happened to be on call for Dr. Matthias [name], obtained CT of the abdomen and pelvis on August 30, 2005.  As far as the abdomen and pelvis were concerned, there was no evidence of colitis but there were multiple scattered sclerotic lesions in the bone suggesting possible sclerotic metastatic disease (versus osteopoikilosis.)  Accordingly a bone scan was obtained.  This was done on September 01, 2005, and found tiny sclerotic lesions, and in particular a lesion of concern in the sternum.  Accordingly a CT scan of the chest was obtained.  This found the sternal problem to correspond with degenerative changes. However, it also showed a soft tissue nodule in the left breast.  The patient then had mammograms on September 27, 2005.  This found two areas of spiculation and architectural distortion in the upper outer quadrant of the left breast.  By ultrasound these were hyperechoic and irregular, highly suspicious for carcinoma.  Biopsy was obtained the same day and showed [id number], as well as [phone]) invasive lobular carcinoma, with both lesions biopsied being ER and PR positive, both herceptest positive, both negative by FISH.  Accordingly the patient was referred to Dr. Corliss [name] and breast MRI was obtained October 06, 2006.  No abnormal enhancement was noted in the right breast.  There was enhancing nodularity in the upper portion of the left breast where several discrete nodules were seen.   Accordingly Dr. Corliss [name] performed a left modified radical mastectomy on November 02, 2005.  The patient had a positive sentinel lymph node and Dr. Corliss [name] performed a left  axillary lymph node dissection.  However, only three additional lymph nodes were recovered, two of which also showed metastatic involvement (all had extra capsular extension.)  The patient's subsequent history is as detailed below   INTERVAL HISTORY: Jamie [name] was to have completed her radiation treatments to her sacrum and lumbar spine 05/11/2013, however she developed crampy abdominal pain with diarrhea which took her to the ED 7/26 and again 7/28. Workup included an abdominal CT which showed nonspecific fluid levels within the colon and thickening of the distal sigmoid extending up to the rectum compatible with colitis.  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 C diff was negative  REVIEW OF SYSTEMS: Jamie [name] is still having significant diarrhea. She thinks not eating is upsetting her stomach, but in fact she is not hungry. Aside from scant blood in the stools and the continuing (sometwhat improved) GI issues she is "fine" and denies Right hip pain at present. A detailed review of systems today was otehrwise unremarkable  PAST MEDICAL HISTORY: Past Medical History   Diagnosis  Date   .  Anxiety     .  Asthma     .  Breast cancer         metastasized   .  Depression     .  Diabetes mellitus     .  GERD (gastroesophageal reflux disease)     .  Hyperlipidemia     .  Internal hemorrhoids     .  Hx  of adenomatous colonic polyps     .  IBS (irritable bowel syndrome)     .  Mitral valve prolapse     .  Ischemic colitis     .  Fibromyalgia     .  Allergy     .  Cataract      diabetes, hypercholesterolemia, asthma, mitral valve prolapse requiring antibiotic prophylaxis before any invasive procedures, Meniere's disease, gastroesophageal reflux disease, osteoarthritis, degenerative disk disease, history of fibromyalgia, panic attacks, history of synovial cyst removal, history of total hysterectomy  with bilateral salpingo-oophorectomy, history of septoplasty x2, status post appendectomy, status post dilatation and curettage remotely, status post bilateral carpal tunnel release under Dr. Rosanne Ashing [place].   PAST SURGICAL HISTORY: Past Surgical History   Procedure  Laterality  Date   .  Mastectomy           left  and then treated with chemo and radiation   .  Total abdominal hysterectomy       .  Back surgery           synovial cyst removed from spine   .  Carpal tunnel release           bilateral   .  Dilation and curettage of uterus       .  Appendectomy       .  Cataract surgery  Bilateral      FAMILY HISTORY The patient's father died at the age of 85 from a stroke.  The patient's mother died at the age of 67, also from a stroke.  The patient has three brothers; one died with "liver disease."  One has prostate cancer.  The other brother has prostate and colon cancer.  The only breast cancer in the family was the patient's mother's mother and she does not know how old her grandmother was when she was diagnosed.  There is no history of ovarian cancer in the family.  GYNECOLOGIC HISTORY: She is GXP2.  She had her hysterectomy/ BSO while still menstruating and she took hormone replacement until December 2006, when she had her breast biopsy.   SOCIAL HISTORY: She used to work as a Lawyer.  Her husband Jamie [name] (who is the son of my former patient Jamie [name]) is semiretired, working for a [employer] risk analyst.  What he likes to do is hunt and fish.  Their son Jamie [name] had significant muscular dystrophy and died in Feb 27, 2007. Their daughter Jamie [name] lives in [place] and works in an office.  The patient has two granddaughters, both RNs, one working at [employer] and the other in [place].                  ADVANCED DIRECTIVES:   HEALTH MAINTENANCE: History   Substance Use Topics   .  Smoking status:  Never Smoker    .  Smokeless tobacco:  Never Used   .  Alcohol Use:  No                 Colonoscopy:             PAP:             Bone density:             Lipid panel:    Allergies   Allergen  Reactions   .  Niacin     .  Promethazine Hcl        Current Outpatient Prescriptions   Medication  Sig  Dispense  Refill   .  acetaminophen (TYLENOL) 500 MG tablet  Take 500 mg by mouth 2 (two) times daily.         .  Alum Hydroxide-Mag Carbonate (GAVISCON PO)  Take 1 capsule by mouth daily.         .  beclomethasone (QVAR) 40 MCG/ACT inhaler  Inhale 2 puffs into the lungs at bedtime.         .  betamethasone valerate ointment (VALISONE) 0.1 %           .  BYSTOLIC 5 MG tablet  Take 1 tablet by mouth Daily.         .  Cholecalciferol (D3 ADULT) 1000 UNITS CHEW  Chew 1,000 Units by mouth daily.         .  clonazePAM (KLONOPIN) 0.5 MG tablet  Take 0.5 mg by mouth 2 (two) times daily.           .  CRESTOR 5 MG tablet  Take 1 tablet by mouth Daily.         .  fulvestrant (FASLODEX) 250 MG/5ML injection  Inject 250 mg into the muscle every 30 (thirty) days. One injection each buttock over 1-2 minutes. Warm prior to use.         Marland Kitchen  glimepiride (AMARYL) 1 MG tablet  Take 0.5 tablets by mouth Daily.          Marland Kitchen  HYDROCHLOROTHIAZIDE PO  Take 75 mg by mouth as needed. swelling          .  metFORMIN (GLUCOPHAGE) 500 MG tablet  Take 1 tablet by mouth Daily.         Marland Kitchen  NEXIUM 40 MG capsule  Take 1 tablet by mouth Daily.         .  ondansetron (ZOFRAN) 4 MG tablet  Take 1 tab twice daily as needed for nausea.   40 tablet   2   .  ONGLYZA 5 MG TABS tablet  Take 5 mg by mouth daily.          .  polyethylene glycol powder (GLYCOLAX/MIRALAX) powder  Take 17 g by mouth daily.         Marland Kitchen  PROAIR HFA 108 (90 BASE) MCG/ACT inhaler           .  fluconazole (DIFLUCAN) 150 MG tablet  as needed.          .  meclizine (ANTIVERT) 25 MG tablet  Take 1 tablet by mouth as needed. dizziness         .  NASONEX 50 MCG/ACT nasal spray  1 spray by Nasal route at bedtime.         Marland Kitchen  nystatin (MYCOSTATIN) 100000  UNIT/ML suspension           .  sucralfate (CARAFATE) 1 G tablet  Take 1 tablet (1 g total) by mouth 4 (four) times daily.   120 tablet   1       Scheduled Meds: . cholestyramine  1 packet Oral BID WC  . ciprofloxacin  400 mg Intravenous Q12H  . clonazePAM  0.5 mg Oral BID  . dicyclomine  10 mg Oral TID AC & HS  . fluticasone  2 spray Each Nare Daily  . fluticasone  2 puff Inhalation BID  . insulin aspart  0-9 Units Subcutaneous TID WC  . lidocaine-hydrocortisone  1 Applicatorful Rectal BID  . linagliptin  5 mg Oral Daily  . metronidazole  500 mg Intravenous Q8H  . nebivolol  5 mg Oral Daily  . pantoprazole  40 mg Oral Daily  . predniSONE  10 mg Oral Daily   Continuous Infusions: . dextrose 5 % and 0.45% NaCl 50 mL/hr (05/15/13 1754)   PRN Meds:.albuterol, meclizine, oxyCODONE-acetaminophen   OBJECTIVE: Middle-aged white woman examined sitting up at bedside Filed Vitals:   05/16/13 0628  BP: 131/60  Pulse: 75  Temp: 98.4 F (36.9 C)  Resp: 18   Sclerae unicteric Oropharynx clear No cervical, supraclavicular, or axillary adenopathy Lungs clear to auscultation, good excursion bilaterally Heart regular rate and rhythm Abd soft, nontender, positive bowel sounds, no masses palpated MSK no focal spinal tendernessincluding lumbar spine, no peripheral edema Neuro: nonfocal, well oriented, positive affect Breasts: Deferred.    Lab Results   Basic Metabolic Panel:  Recent Labs Lab 05/12/13 1432 05/14/13 2113 05/16/13 0438  NA 140 140 140  K 4.3 3.2* 3.2*  CL 105 104 105  CO2 29 28 26   GLUCOSE 119* 164* 120*  BUN 10 6 3*  CREATININE 0.54 0.54 0.59  CALCIUM 8.9 9.0 8.6   GFR Estimated Creatinine Clearance: 58.9 ml/min (by C-G formula based on Cr of 0.59). Liver Function Tests:  Recent Labs Lab 05/12/13 1432 05/14/13 2113  AST 13 14  ALT 11 10  ALKPHOS 64 57  BILITOT 0.2* 0.2*  PROT 6.2 6.2  ALBUMIN 3.2* 3.2*    Recent Labs Lab 05/12/13 1432  LIPASE  21   No results found for this basename: AMMONIA,  in the last 168 hours Coagulation profile No results found for this basename: INR, PROTIME,  in the last 168 hours  CBC:  Recent Labs Lab 05/12/13 1432 05/14/13 2113 05/16/13 0438  WBC 6.7 8.8 5.4  NEUTROABS 4.2 5.4  --   HGB 11.9* 11.5* 11.1*  HCT 35.2* 33.8* 32.5*  MCV 95.7 95.2 95.6  PLT 153 154 129*   Cardiac Enzymes: No results found for this basename: CKTOTAL, CKMB, CKMBINDEX, TROPONINI,  in the last 168 hours BNP: No components found with this basename: POCBNP,  CBG:  Recent Labs Lab 05/15/13 0712 05/15/13 1112 05/15/13 1644 05/15/13 2146 05/16/13 0727  GLUCAP 103* 121* 79 100* 153*   D-Dimer No results found for this basename: DDIMER,  in the last 72 hours Hgb A1c No results found for this basename: HGBA1C,  in the last 72 hours Lipid Profile No results found for this basename: CHOL, HDL, LDLCALC, TRIG, CHOLHDL, LDLDIRECT,  in the last 72 hours Thyroid function studies No results found for this basename: TSH, T4TOTAL, FREET3, T3FREE, THYROIDAB,  in the last 72 hours Anemia work up No results found for this basename: VITAMINB12, FOLATE, FERRITIN, TIBC, IRON, RETICCTPCT,  in the last 72 hours Microbiology Recent Results (from the past 240 hour(s))  CLOSTRIDIUM DIFFICILE BY PCR     Status: None   Collection Time    05/13/13  5:00 AM      Result Value Range Status   C difficile by pcr NEGATIVE  NEGATIVE Final  CLOSTRIDIUM DIFFICILE BY PCR     Status: None   Collection Time    05/15/13  7:15 AM      Result Value Range Status   C difficile by pcr NEGATIVE  NEGATIVE Final    STUDIES: Dg Chest 2 View  04/30/2013   *RADIOLOGY REPORT*  Clinical Data: Cough and chest pain.  CHEST - 2 VIEW  Comparison: 10/16/2011.  Findings: The cardiac silhouette, mediastinal and hilar  contours are within normal limits and stable.  Chronic lung changes with upper lobe scarring and chronic bronchitic changes.  No acute  pulmonary findings.  No pleural effusion.  Stable sclerotic metastatic bone lesions. Stable surgical changes from a left mastectomy.  IMPRESSION:  1.  Chronic lung changes. 2.  No acute cardiopulmonary findings. 3.  Stable sclerotic osseous metastatic disease.   Original Report Authenticated By: Rudie Meyer, [name].   Ct Abdomen Pelvis W Contrast  05/14/2013   *RADIOLOGY REPORT*  Clinical Data: Diarrhea, abdominal pain.  Abdominal cramping.  CT ABDOMEN AND PELVIS WITH CONTRAST  Technique:  Multidetector CT imaging of the abdomen and pelvis was performed following the standard protocol during bolus administration of intravenous contrast.  Contrast: OMNIPAQUE IOHEXOL 300 MG/ML  SOLN, 50mL OMNIPAQUE IOHEXOL 300 MG/ML  SOLN  Comparison: CT 08/24/2012.  MRI 04/01/2013.  Findings: Lung Bases: Lung bases appears similar to prior with nodular scarring in the right middle lobe and scattered areas of dependent atelectasis.  Left mastectomy is incidentally noted.  Liver:  Normal.  Spleen:  Normal.  Gallbladder:  Contracted.  No calcified stones or inflammatory changes.  Common bile duct:  Normal.  Pancreas:  Normal.  Adrenal glands:  Normal bilaterally.  Kidneys:  Normal enhancement and delayed excretion of contrast. Punctate nonobstructing right renal collecting system calculi are present.  Left ureter can be followed to the urinary bladder appears normal.  Right ureter is also normal.  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  Stomach:  Normal.  Small bowel:  Normal.  No inflammatory changes or dilation.  The duodenum is within normal limits.  There is no mesenteric adenopathy.  Colon:   Normal appendix.  Nonspecific fluid levels are present within the colon, most commonly associated with enteric infection. This correlates with the clinical history of diarrhea.  Thickening of the distal sigmoid extending up to the rectum is present, compatible with colitis.  Diverticulosis without  diverticulitis.  Pelvic Genitourinary:  Hysterectomy.  Urinary bladder is partially decompressed and appears within normal limits.  Bones:  Diffuse sclerotic osteoblastic metastases are present.  No pathologic fracture is identified.  Vasculature: Mild atherosclerosis is present.  Portal vein appears patent. Visceral vessels appear patent.  The IMA is patent.  Body Wall: Diffuse infiltration of the gluteal subcutaneous fat compatible with prior injections.  IMPRESSION:  1.  Findings compatible with colitis.  Sigmoid mural thickening with diffuse colonic air fluid levels. Be due to ischemic colitis, infection with inflammatory bowel disease considered unlikely in the absence of significant clinical history. 2.  Nonobstructing punctate right renal collecting system calculi with congenital right UPJ obstruction. 3.  Left mastectomy with sclerotic bone metastases.   Original Report Authenticated By: Andreas Newport, [name].   Dg Knee Complete 4 Views Left  04/19/2013   *RADIOLOGY REPORT*  Clinical Data: Pain and swelling in the left knee for several weeks.  No known injury  LEFT KNEE - COMPLETE 4+ VIEW  Comparison: None.  Findings: Bone density is within normal limits for age.  There is mild medial joint space narrowing identified.  No significant osteophytosis is identified.  No acute fracture or dislocation is seen.  No abnormal periarticular calcification or signs of chondrocalcinosis are noted.  No joint effusion is seen.  Soft tissue planes appear maintained.  IMPRESSION: Mild medial joint space narrowing.  Otherwise negative   Original Report Authenticated By: Rhodia Albright, [name].   ASSESSMENT:76 y.o. Mardene Sayer woman    (1) status post  left modified radical mastectomy in January 2007 for a T1c N1, stage IIA  invasive lobular carcinoma, grade 1, strongly estrogen and progesterone receptor positive, HER-2 negative, with an MIB-1 of 7%.  (2)  Status post adjuvant radiation to the left chest wall given  concurrently with Capecitabine.  Declined chemotherapy.   (3) Status post anastrozole and exemestane, both discontinued due to aches and pains. Status post tamoxifen between October 2007 until August 2011.  Discontinued secondary to cramps   (4) multiple sclerotic bony lesions noted at initial diagnosis, biopsied x2 August 2009 without malignancy documented, on zoledronic acid starting 12/12/2007, currently given monthly, most recent dose 05/14/2013  (5) On fulvestrant since September 2011, with good tolerance  (6)  radiation to the lumbosacral spine and right hip to treat right hip pain thought to be associated with bony metastasis completed 05/10/2013 under the care of Dr. Michell Heinrich.  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 Patient is due for fulvestrant 05/17/2013 and I will arrange for her to receive it as inpatient if not yet discharged. Otherwise she will follow up in our office 06/14/2013. Please let me know if I can be of other help.     Lowella Dell, MD

## 2013-05-16 NOTE — Progress Notes (Signed)
Subjective: F/Up colitis. Feeling a little better. Less Bloated Still with cramps and some diarrhea. Annoyed at the bed alarms.  Objective: Vital signs in last 24 hours: Temp:  [98 F (36.7 C)-98.6 F (37 C)] 98.4 F (36.9 C) (07/30 0628) Pulse Rate:  [65-75] 75 (07/30 0628) Resp:  [18] 18 (07/30 0628) BP: (100-131)/(30-60) 131/60 mmHg (07/30 0628) SpO2:  [96 %-98 %] 97 % (07/30 0628) Weight change:  Last BM Date: 05/15/13  CBG (last 3)   Recent Labs  05/15/13 1644 05/15/13 2146 05/16/13 0727  GLUCAP 79 100* 153*    Intake/Output from previous day:  Intake/Output Summary (Last 24 hours) at 05/16/13 0844 Last data filed at 05/16/13 1610  Gross per 24 hour  Intake   3745 ml  Output      0 ml  Net   3745 ml   07/29 0701 - 07/30 0700 In: 3745 [P.O.:2400; I.V.:645; IV Piggyback:700] Out: -    Physical Exam General appearance: looks better  Resp: CTA B  Cardio: Reg  GI: Less Bloating, Min-tender diffuse, Hyperactive BS. No Reb/Guarding.  Extremities: extremities normal, atraumatic, no cyanosis or edema  Pulses: 2+ and symmetric  Lymph nodes: No cervical lymphadenopathy  Neurologic: Alert and oriented X 3, normal strength and tone. Normal symmetric reflexes.      Lab Results:  Recent Labs  05/14/13 2113 05/16/13 0438  NA 140 140  K 3.2* 3.2*  CL 104 105  CO2 28 26  GLUCOSE 164* 120*  BUN 6 3*  CREATININE 0.54 0.59  CALCIUM 9.0 8.6     Recent Labs  05/14/13 2113  AST 14  ALT 10  ALKPHOS 57  BILITOT 0.2*  PROT 6.2  ALBUMIN 3.2*     Recent Labs  05/14/13 2113 05/16/13 0438  WBC 8.8 5.4  NEUTROABS 5.4  --   HGB 11.5* 11.1*  HCT 33.8* 32.5*  MCV 95.2 95.6  PLT 154 129*    No results found for this basename: INR, PROTIME    No results found for this basename: CKTOTAL, CKMB, CKMBINDEX, TROPONINI,  in the last 72 hours  No results found for this basename: TSH, T4TOTAL, FREET3, T3FREE, THYROIDAB,  in the last 72 hours  No  results found for this basename: VITAMINB12, FOLATE, FERRITIN, TIBC, IRON, RETICCTPCT,  in the last 72 hours  Micro Results: Recent Results (from the past 240 hour(s))  CLOSTRIDIUM DIFFICILE BY PCR     Status: None   Collection Time    05/13/13  5:00 AM      Result Value Range Status   C difficile by pcr NEGATIVE  NEGATIVE Final  CLOSTRIDIUM DIFFICILE BY PCR     Status: None   Collection Time    05/15/13  7:15 AM      Result Value Range Status   C difficile by pcr NEGATIVE  NEGATIVE Final     Studies/Results: Ct Abdomen Pelvis W Contrast  05/14/2013   *RADIOLOGY REPORT*  Clinical Data: Diarrhea, abdominal pain.  Abdominal cramping.  CT ABDOMEN AND PELVIS WITH CONTRAST  Technique:  Multidetector CT imaging of the abdomen and pelvis was performed following the standard protocol during bolus administration of intravenous contrast.  Contrast: OMNIPAQUE IOHEXOL 300 MG/ML  SOLN, 50mL OMNIPAQUE IOHEXOL 300 MG/ML  SOLN  Comparison: CT 08/24/2012.  MRI 04/01/2013.  Findings: Lung Bases: Lung bases appears similar to prior with nodular scarring in the right middle lobe and scattered areas of dependent atelectasis.  Left mastectomy is incidentally noted.  Liver:  Normal.  Spleen:  Normal.  Gallbladder:  Contracted.  No calcified stones or inflammatory changes.  Common bile duct:  Normal.  Pancreas:  Normal.  Adrenal glands:  Normal bilaterally.  Kidneys:  Normal enhancement and delayed excretion of contrast. Punctate nonobstructing right renal collecting system calculi are present.  Left ureter can be followed to the urinary bladder appears normal.  Right ureter is also normal.  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  Stomach:  Normal.  Small bowel:  Normal.  No inflammatory changes or dilation.  The duodenum is within normal limits.  There is no mesenteric adenopathy.  Colon:   Normal appendix.  Nonspecific fluid levels are present within the colon, most commonly  associated with enteric infection. This correlates with the clinical history of diarrhea.  Thickening of the distal sigmoid extending up to the rectum is present, compatible with colitis.  Diverticulosis without diverticulitis.  Pelvic Genitourinary:  Hysterectomy.  Urinary bladder is partially decompressed and appears within normal limits.  Bones:  Diffuse sclerotic osteoblastic metastases are present.  No pathologic fracture is identified.  Vasculature: Mild atherosclerosis is present.  Portal vein appears patent. Visceral vessels appear patent.  The IMA is patent.  Body Wall: Diffuse infiltration of the gluteal subcutaneous fat compatible with prior injections.  IMPRESSION:  1.  Findings compatible with colitis.  Sigmoid mural thickening with diffuse colonic air fluid levels. Be due to ischemic colitis, infection with inflammatory bowel disease considered unlikely in the absence of significant clinical history. 2.  Nonobstructing punctate right renal collecting system calculi with congenital right UPJ obstruction. 3.  Left mastectomy with sclerotic bone metastases.   Original Report Authenticated By: Andreas Newport, [name].     Medications: Scheduled: . cholestyramine  1 packet Oral BID WC  . ciprofloxacin  400 mg Intravenous Q12H  . clonazePAM  0.5 mg Oral BID  . dicyclomine  10 mg Oral TID AC & HS  . fluticasone  2 spray Each Nare Daily  . fluticasone  2 puff Inhalation BID  . insulin aspart  0-9 Units Subcutaneous TID WC  . lidocaine-hydrocortisone  1 Applicatorful Rectal BID  . linagliptin  5 mg Oral Daily  . metronidazole  500 mg Intravenous Q8H  . nebivolol  5 mg Oral Daily  . pantoprazole  40 mg Oral Daily  . predniSONE  10 mg Oral Daily   Continuous: . dextrose 5 % and 0.45% NaCl 50 mL/hr (05/15/13 1754)     Assessment/Plan: Active Problems:   History of ischemic colitis   Diabetes mellitus type 2, noninsulin dependent   Hypertension   Fibromyalgia   Breast cancer metastasized  to bone   Other and unspecified noninfectious gastroenteritis and colitis(558.9)   Diarrhea   Dehydration  1. Colitis -  Continue IVF.   Continue Flagyl empirically and OK to D/c Cipro as infectious unlikely. Radiatiation Proctitis vrs Iscemic colitis.  Appreciate GI and Onc Evals.  Continue Sx management and supportive care. Colonoscopy 1 year ago essentially unremarkable. Stool studies for C. difficile negative. Increase diet to full liq. 2. Breast cancer metastasized to bone - S/P XRT.  Appreciate Dr Princella Pellegrini comments:  (1) status post left modified radical mastectomy in January 2007 for a T1c N1, stage IIA invasive lobular carcinoma, grade 1, strongly estrogen and progesterone receptor positive, HER-2 negative, with an MIB-1 of 7%.   (2) Status post adjuvant radiation to the left chest wall given concurrently with Capecitabine. Declined chemotherapy.   (3) Status  post anastrozole and exemestane, both discontinued due to aches and pains. Status post tamoxifen between October 2007 until August 2011. Discontinued secondary to cramps   (4) multiple sclerotic bony lesions noted at initial diagnosis, biopsied x2 August 2009 without malignancy documented, on zoledronic acid starting 12/12/2007, currently given monthly, most recent dose 05/14/2013   (5) On fulvestrant since September 2011, with good tolerance   (6) radiation to the lumbosacral spine and right hip to treat right hip pain thought to be associated with bony metastasis completed 05/10/2013 under the care of Dr. Michell Heinrich.   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 Otherwise she will follow up Onc office 06/14/2013.  3. Hypokalemia - Replete again.  4. Anxiety/Depression/panic - on meds.  5. Lipids - OK to hold Crestor  6 Fibromyalgia  7. DVT Proph.  8. Asthma - Q Var  9 GERD - PPI 10  DM2 - ISS. Hold Metfomin with Diarrhea. -  Recent Labs  05/15/13 1644 05/15/13 2146 05/16/13 0727  GLUCAP 79 100* 153*   ID -  Anti-infectives   Start     Dose/Rate Route Frequency Ordered Stop   05/15/13 1800  ciprofloxacin (CIPRO) IVPB 400 mg     400 mg 200 mL/hr over 60 Minutes Intravenous Every 12 hours 05/15/13 0833     05/15/13 1000  metroNIDAZOLE (FLAGYL) IVPB 500 mg     500 mg 100 mL/hr over 60 Minutes Intravenous Every 8 hours 05/15/13 0833     05/15/13 0000  ciprofloxacin (CIPRO) IVPB 400 mg     400 mg 200 mL/hr over 60 Minutes Intravenous  Once 05/14/13 2357 05/15/13 0343   05/15/13 0000  metroNIDAZOLE (FLAGYL) IVPB 500 mg     500 mg 100 mL/hr over 60 Minutes Intravenous  Once 05/14/13 2357 05/15/13 0152      LOS: 2 days   Rafay Dahan M 05/16/2013, 8:44 AM

## 2013-05-16 NOTE — ED Provider Notes (Signed)
Medical screening examination/treatment/procedure(s) were conducted as a shared visit with non-physician practitioner(s) and myself.  I personally evaluated the patient during the encounter  Derwood Kaplan, MD 05/16/13 1536

## 2013-05-16 NOTE — Progress Notes (Signed)
[facility] Progress Note   Subjective  diarrhea about the same as yesterday. Still passing small amount of blood with BM. Cramps not much better.    Objective   Vital signs in last 24 hours: Temp:  [98 F (36.7 C)-98.6 F (37 C)] 98.4 F (36.9 C) (07/30 0628) Pulse Rate:  [65-75] 75 (07/30 0628) Resp:  [18] 18 (07/30 0628) BP: (100-131)/(30-60) 131/60 mmHg (07/30 0628) SpO2:  [96 %-98 %] 97 % (07/30 0909) Last BM Date: 05/15/13 General:    Pleasant white female in bedside chair in NAD Heart:  Regular rate and rhythm Lungs: Respirations even and unlabored Abdomen:  Soft, nontender and nondistended. Normal bowel sounds. Extremities:  Without edema. Neurologic:  Alert and oriented,  grossly normal neurologically. Psych:  Cooperative. Normal mood and affect.   Lab Results:  Recent Labs  05/14/13 2113 05/16/13 0438  WBC 8.8 5.4  HGB 11.5* 11.1*  HCT 33.8* 32.5*  PLT 154 129*   BMET  Recent Labs  05/14/13 2113 05/16/13 0438  NA 140 140  K 3.2* 3.2*  CL 104 105  CO2 28 26  GLUCOSE 164* 120*  BUN 6 3*  CREATININE 0.54 0.59  CALCIUM 9.0 8.6    Studies/Results: Ct Abdomen Pelvis W Contrast  05/14/2013   *RADIOLOGY REPORT*  Clinical Data: Diarrhea, abdominal pain.  Abdominal cramping.  CT ABDOMEN AND PELVIS WITH CONTRAST  Technique:  Multidetector CT imaging of the abdomen and pelvis was performed following the standard protocol during bolus administration of intravenous contrast.  Contrast: OMNIPAQUE IOHEXOL 300 MG/ML  SOLN, 50mL OMNIPAQUE IOHEXOL 300 MG/ML  SOLN  Comparison: CT 08/24/2012.  MRI 04/01/2013.  Findings: Lung Bases: Lung bases appears similar to prior with nodular scarring in the right middle lobe and scattered areas of dependent atelectasis.  Left mastectomy is incidentally noted.  Liver:  Normal.  Spleen:  Normal.  Gallbladder:  Contracted.  No calcified stones or inflammatory changes.  Common bile duct:  Normal.  Pancreas:  Normal.   Adrenal glands:  Normal bilaterally.  Kidneys:  Normal enhancement and delayed excretion of contrast. Punctate nonobstructing right renal collecting system calculi are present.  Left ureter can be followed to the urinary bladder appears normal.  Right ureter is also normal.  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  Stomach:  Normal.  Small bowel:  Normal.  No inflammatory changes or dilation.  The duodenum is within normal limits.  There is no mesenteric adenopathy.  Colon:   Normal appendix.  Nonspecific fluid levels are present within the colon, most commonly associated with enteric infection. This correlates with the clinical history of diarrhea.  Thickening of the distal sigmoid extending up to the rectum is present, compatible with colitis.  Diverticulosis without diverticulitis.  Pelvic Genitourinary:  Hysterectomy.  Urinary bladder is partially decompressed and appears within normal limits.  Bones:  Diffuse sclerotic osteoblastic metastases are present.  No pathologic fracture is identified.  Vasculature: Mild atherosclerosis is present.  Portal vein appears patent. Visceral vessels appear patent.  The IMA is patent.  Body Wall: Diffuse infiltration of the gluteal subcutaneous fat compatible with prior injections.  IMPRESSION:  1.  Findings compatible with colitis.  Sigmoid mural thickening with diffuse colonic air fluid levels. Be due to ischemic colitis, infection with inflammatory bowel disease considered unlikely in the absence of significant clinical history. 2.  Nonobstructing punctate right renal collecting system calculi with congenital right UPJ obstruction. 3.  Left mastectomy with sclerotic bone  metastases.   Original Report Authenticated By: Andreas Newport, [name].     Assessment / Plan:    1. Two week history of crampy diarrhea, now passing small amount of blood with BMs.   CTscan c/w sigmoid colitis.  C-diff negative. Still suspect radiation induced injury to  bowel though ischemia or other etiologies not excluded at this point. Overall volume of stool / blood sounds low. Slight drop in hgb overnight from 11.5 to 11.1. Main issues are persistent cramps and what sounds like tenesmus. She is on BID Questran. Will increase bowel antispasmodic dose, add BID imodium. Continue hemorrhoidal rx. If no improvement soon, may need flex sig or colonoscopy.    2. Metastatic breast cancer   LOS: 2 days   Willette Cluster  05/16/2013, 10:00 AM   GI ATTENDING  Patient personally seen and examined. Agree with interval history, laboratories, and exam as outlined above. I saw the patient this afternoon. She states that she is feeling better than yesterday. Let us pain, less diarrhea, still with some tenesmus and minor bleeding. Continue with supportive care. We will continue to follow. Discussed with patient. Thank you  Wilhemina Bonito. Eda Keys., [name]. [facility] Division of Gastroenterology

## 2013-05-17 ENCOUNTER — Other Ambulatory Visit: Payer: Medicare Other | Admitting: Lab

## 2013-05-17 ENCOUNTER — Ambulatory Visit: Payer: Medicare Other

## 2013-05-17 ENCOUNTER — Encounter (HOSPITAL_COMMUNITY): Payer: Self-pay | Admitting: *Deleted

## 2013-05-17 ENCOUNTER — Ambulatory Visit: Payer: Medicare Other | Admitting: Physician Assistant

## 2013-05-17 LAB — CBC
MCV: 94.6 fL (ref 78.0–100.0)
Platelets: 141 10*3/uL — ABNORMAL LOW (ref 150–400)
RDW: 12.9 % (ref 11.5–15.5)
WBC: 4.3 10*3/uL (ref 4.0–10.5)

## 2013-05-17 LAB — BASIC METABOLIC PANEL
Calcium: 8.8 mg/dL (ref 8.4–10.5)
Creatinine, Ser: 0.52 mg/dL (ref 0.50–1.10)
GFR calc Af Amer: >90 mL/min (ref >90)

## 2013-05-17 LAB — GLUCOSE, CAPILLARY: Glucose-Capillary: 142 mg/dL — ABNORMAL HIGH (ref 70–99)

## 2013-05-17 MED ORDER — CHOLESTYRAMINE 4 G PO PACK
1.0000 | PACK | ORAL | Status: DC
  Administered 2013-05-17: 1 via ORAL
  Filled 2013-05-17: qty 1

## 2013-05-17 MED ORDER — DICYCLOMINE HCL 10 MG PO CAPS: ORAL_CAPSULE | ORAL | Status: DC

## 2013-05-17 MED ORDER — METFORMIN HCL ER 500 MG PO TB24: ORAL_TABLET | ORAL | Status: DC

## 2013-05-17 MED ORDER — HYDROCORTISONE 2.5 % RE CREA
TOPICAL_CREAM | Freq: Four times a day (QID) | RECTAL | Status: DC
  Administered 2013-05-17 (×3): via RECTAL
  Administered 2013-05-18 (×2): 1 via RECTAL
  Filled 2013-05-17: qty 28.35

## 2013-05-17 MED ORDER — METRONIDAZOLE 500 MG PO TABS
500.0000 mg | ORAL_TABLET | Freq: Three times a day (TID) | ORAL | Status: DC
  Administered 2013-05-17 – 2013-05-18 (×4): 500 mg via ORAL
  Filled 2013-05-17 (×6): qty 1

## 2013-05-17 MED ORDER — PANTOPRAZOLE SODIUM 40 MG PO TBEC
40.0000 mg | DELAYED_RELEASE_TABLET | Freq: Once | ORAL | Status: AC
  Administered 2013-05-17: 40 mg via ORAL

## 2013-05-17 MED ORDER — LIDOCAINE-HYDROCORTISONE ACE 3-0.5 % RE CREA: TOPICAL_CREAM | RECTAL | Status: DC

## 2013-05-17 MED ORDER — ENOXAPARIN SODIUM 30 MG/0.3ML ~~LOC~~ SOLN
30.0000 mg | SUBCUTANEOUS | Status: DC
  Administered 2013-05-17 – 2013-05-18 (×2): 30 mg via SUBCUTANEOUS
  Filled 2013-05-17 (×2): qty 0.3

## 2013-05-17 MED ORDER — FULVESTRANT 250 MG/5ML IM SOLN
500.0000 mg | Freq: Once | INTRAMUSCULAR | Status: AC
  Administered 2013-05-17: 500 mg via INTRAMUSCULAR
  Filled 2013-05-17: qty 10

## 2013-05-17 MED ORDER — LOPERAMIDE HCL 2 MG PO CAPS: ORAL_CAPSULE | ORAL | Status: DC

## 2013-05-17 MED ORDER — LIDOCAINE 5 % EX OINT
TOPICAL_OINTMENT | Freq: Four times a day (QID) | CUTANEOUS | Status: DC | PRN
  Administered 2013-05-18: 1 via TOPICAL
  Filled 2013-05-17: qty 35.44

## 2013-05-17 MED ORDER — LOPERAMIDE HCL 2 MG PO CAPS: 2.0000 mg | ORAL_CAPSULE | Freq: Every day | ORAL | Status: DC

## 2013-05-17 MED ORDER — METRONIDAZOLE 500 MG PO TABS: ORAL_TABLET | ORAL | Status: DC

## 2013-05-17 MED ORDER — LOPERAMIDE HCL 2 MG PO CAPS: 2.0000 mg | ORAL_CAPSULE | Freq: Two times a day (BID) | ORAL | Status: DC | PRN

## 2013-05-17 MED ORDER — GLIMEPIRIDE 1 MG PO TABS: ORAL_TABLET | ORAL | Status: DC

## 2013-05-17 MED ORDER — ALUM & MAG HYDROXIDE-SIMETH 200-200-20 MG/5ML PO SUSP: 15.0000 mL | ORAL | Status: DC | PRN

## 2013-05-17 NOTE — Progress Notes (Signed)
Called md on call d/t pt c/o refulx. Awaiting call back.

## 2013-05-17 NOTE — Progress Notes (Signed)
Subjective: F/Up colitis. Feeling better less crampy. Wants eggs for breakfast. Wants to have a real BM. Had a great night sleep. Diarrhea has stopped and now she is worried @ getting constipated. Less Bloated.  Objective: Vital signs in last 24 hours: Temp:  [97.7 F (36.5 C)-97.8 F (36.6 C)] 97.7 F (36.5 C) (07/31 0532) Pulse Rate:  [66-67] 67 (07/31 0532) Resp:  [18] 18 (07/31 0532) BP: (135-138)/(45-58) 138/58 mmHg (07/31 0532) SpO2:  [97 %-98 %] 98 % (07/31 0532) Weight:  [67.858 kg (149 lb 9.6 oz)] 67.858 kg (149 lb 9.6 oz) (07/31 0532) Weight change:  Last BM Date: 05/16/13  CBG (last 3)   Recent Labs  05/16/13 1137 05/16/13 1646 05/16/13 2106  GLUCAP 168* 208* 136*    Intake/Output from previous day:  Intake/Output Summary (Last 24 hours) at 05/17/13 0711 Last data filed at 05/17/13 0204  Gross per 24 hour  Intake   1350 ml  Output      0 ml  Net   1350 ml   07/30 0701 - 07/31 0700 In: 1350 [P.O.:240; I.V.:810; IV Piggyback:300] Out: -    Physical Exam General appearance: looks better  Resp: CTA B  Cardio: Reg  GI: Less Bloating, Non tender. BS improving. No Reb/Guarding.  Extremities: extremities normal, atraumatic, no cyanosis or edema  Pulses: 2+ and symmetric  Lymph nodes: No cervical lymphadenopathy  Neurologic: Alert and oriented X 3, normal strength and tone. Normal symmetric reflexes.      Lab Results:  Recent Labs  05/16/13 0438 05/17/13 0430  NA 140 139  K 3.2* 3.5  CL 105 107  CO2 26 23  GLUCOSE 120* 127*  BUN 3* <3*  CREATININE 0.59 0.52  CALCIUM 8.6 8.8     Recent Labs  05/14/13 2113  AST 14  ALT 10  ALKPHOS 57  BILITOT 0.2*  PROT 6.2  ALBUMIN 3.2*     Recent Labs  05/14/13 2113 05/16/13 0438 05/17/13 0430  WBC 8.8 5.4 4.3  NEUTROABS 5.4  --   --   HGB 11.5* 11.1* 11.4*  HCT 33.8* 32.5* 33.2*  MCV 95.2 95.6 94.6  PLT 154 129* 141*    No results found for this basename: INR,  PROTIME    No  results found for this basename: CKTOTAL, CKMB, CKMBINDEX, TROPONINI,  in the last 72 hours  No results found for this basename: TSH, T4TOTAL, FREET3, T3FREE, THYROIDAB,  in the last 72 hours  No results found for this basename: VITAMINB12, FOLATE, FERRITIN, TIBC, IRON, RETICCTPCT,  in the last 72 hours  Micro Results: Recent Results (from the past 240 hour(s))  CLOSTRIDIUM DIFFICILE BY PCR     Status: None   Collection Time    05/13/13  5:00 AM      Result Value Range Status   C difficile by pcr NEGATIVE  NEGATIVE Final  CLOSTRIDIUM DIFFICILE BY PCR     Status: None   Collection Time    05/15/13  7:15 AM      Result Value Range Status   C difficile by pcr NEGATIVE  NEGATIVE Final     Studies/Results: Dg Abd 1 View  05/16/2013   *RADIOLOGY REPORT*  Clinical Data: colitis and diarrhea for two weeks  ABDOMEN - 1 VIEW  Comparison: ct scan 05/14/13  Findings: Diffuse blastic skeletal metastasis again noted. Recently administered oral contrast seen throughout colon, which is not dilated.  No abnormal bowel dilitation.  IMPRESSION: Nonobstructive bowel gas pattern.  Original Report Authenticated By: Esperanza Heir, [name].     Medications: Scheduled: . cholestyramine  1 packet Oral BID WC  . clonazePAM  0.5 mg Oral BID  . dicyclomine  20 mg Oral TID AC  . enoxaparin (LOVENOX) injection  30 mg Subcutaneous Q24H  . fluticasone  2 spray Each Nare Daily  . fluticasone  2 puff Inhalation BID  . insulin aspart  0-9 Units Subcutaneous TID WC  . lidocaine-hydrocortisone  1 Applicatorful Rectal BID  . linagliptin  5 mg Oral Daily  . loperamide  2 mg Oral Daily  . metronidazole  500 mg Intravenous Q8H  . nebivolol  5 mg Oral Daily  . pantoprazole  40 mg Oral Daily  . potassium chloride  20 mEq Oral BID  . predniSONE  10 mg Oral Daily   Continuous:     Assessment/Plan: Active Problems:   History of ischemic colitis   Diabetes mellitus type 2, noninsulin dependent   Hypertension    Fibromyalgia   Breast cancer metastasized to bone   Other and unspecified noninfectious gastroenteritis and colitis(558.9)   Diarrhea   Dehydration  1. Colitis -  Continue IVF.   Continue Flagyl empirically and change back to PO.  OK off Cipro as infectious unlikely. Radiatiation Proctitis vrs Iscemic colitis.  Appreciate GI and Onc Evals.  Continue Sx management and supportive care. Colonoscopy 1 year ago essentially unremarkable. Stool studies for C. difficile negative. Increase diet to Regular.  Diarrhea has stopped and now she is worried @ getting constipated. Meds and fluids adjusted and if she continues at current pace she will be d/ced tomorrow am.  Hbg stable to min improved today = bleeding stopped?  2. Breast cancer metastasized to bone - S/P XRT.  Appreciate Dr Princella Pellegrini comments:  (1) status post left modified radical mastectomy in January 2007 for a T1c N1, stage IIA invasive lobular carcinoma, grade 1, strongly estrogen and progesterone receptor positive, HER-2 negative, with an MIB-1 of 7%.   (2) Status post adjuvant radiation to the left chest wall given concurrently with Capecitabine. Declined chemotherapy.   (3) Status post anastrozole and exemestane, both discontinued due to aches and pains. Status post tamoxifen between October 2007 until August 2011. Discontinued secondary to cramps   (4) multiple sclerotic bony lesions noted at initial diagnosis, biopsied x2 August 2009 without malignancy documented, on zoledronic acid starting 12/12/2007, currently given monthly, most recent dose 05/14/2013   (5) On fulvestrant since September 2011, with good tolerance   (6) radiation to the lumbosacral spine and right hip to treat right hip pain thought to be associated with bony metastasis completed 05/10/2013 under the care of Dr. Michell Heinrich.   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 Otherwise she will follow up Onc office 06/14/2013.   3. Hypokalemia - Repleted.  4. Anxiety/Depression/panic - on meds.  5. Lipids - Restart Crestor on D/C 6 Fibromyalgia  7. DVT Proph. OK to add Lovenox to SCDs  8. Asthma - Q Var  9 GERD - PPI 10 DM2 - ISS. Hold Metfomin with Diarrhea. Continue tradjenta.  CBGs ok.-   Recent Labs  05/16/13 1137 05/16/13 1646 05/16/13 2106  GLUCAP 168* 208* 136*   ID -  Anti-infectives   Start     Dose/Rate Route Frequency Ordered Stop   05/15/13 1800  ciprofloxacin (CIPRO) IVPB  400 mg  Status:  Discontinued     400 mg 200 mL/hr over 60 Minutes Intravenous Every 12 hours 05/15/13 0833 05/16/13 0856   05/15/13 1000  metroNIDAZOLE (FLAGYL) IVPB 500 mg     500 mg 100 mL/hr over 60 Minutes Intravenous Every 8 hours 05/15/13 0833     05/15/13 0000  ciprofloxacin (CIPRO) IVPB 400 mg     400 mg 200 mL/hr over 60 Minutes Intravenous  Once 05/14/13 2357 05/15/13 0343   05/15/13 0000  metroNIDAZOLE (FLAGYL) IVPB 500 mg     500 mg 100 mL/hr over 60 Minutes Intravenous  Once 05/14/13 2357 05/15/13 0152      LOS: 3 days   Garland Smouse M 05/17/2013, 7:11 AM

## 2013-05-17 NOTE — Progress Notes (Signed)
Patient ID: Jamie [name], female   DOB: [birth year], 77 y.o.   MRN: [id number] [facility] Progress Note  Subjective: Feeling much better- had solid food for breakfast, no significant pain or cramping today and no BM's... Now worried about getting stopped up... Still weak,due for chemo today  Objective:  Vital signs in last 24 hours: Temp:  [97.7 F (36.5 C)-97.8 F (36.6 C)] 97.7 F (36.5 C) (07/31 0532) Pulse Rate:  [66-67] 67 (07/31 0532) Resp:  [18] 18 (07/31 0532) BP: (135-138)/(45-58) 138/58 mmHg (07/31 0532) SpO2:  [97 %-98 %] 97 % (07/31 0803) Weight:  [149 lb 9.6 oz (67.858 kg)] 149 lb 9.6 oz (67.858 kg) (07/31 0532) Last BM Date: 05/16/13 General:   Alert,  Well-developed,WF    in NAD Heart:  Regular rate and rhythm; no murmurs Pulm;clear Abdomen:  Soft,basically  nontender and nondistended.  bowel sounds hyperactive , without guarding, and without rebound.   Extremities:  Without edema. Neurologic:  Alert and  oriented x4;  grossly normal neurologically. Psych:  Alert and cooperative. Normal mood and affect.  Intake/Output from previous day: 07/30 0701 - 07/31 0700 In: 1750 [P.O.:240; I.V.:1210; IV Piggyback:300] Out: -  Intake/Output this shift:    Lab Results:  Recent Labs  05/14/13 2113 05/16/13 0438 05/17/13 0430  WBC 8.8 5.4 4.3  HGB 11.5* 11.1* 11.4*  HCT 33.8* 32.5* 33.2*  PLT 154 129* 141*   BMET  Recent Labs  05/14/13 2113 05/16/13 0438 05/17/13 0430  NA 140 140 139  K 3.2* 3.2* 3.5  CL 104 105 107  CO2 28 26 23   GLUCOSE 164* 120* 127*  BUN 6 3* <3*  CREATININE 0.54 0.59 0.52  CALCIUM 9.0 8.6 8.8   LFT  Recent Labs  05/14/13 2113  PROT 6.2  ALBUMIN 3.2*  AST 14  ALT 10  ALKPHOS 57  BILITOT 0.2*     Assessment / Plan: #1 77 yo female with metastatic breast CA to bone -undergoing radiation with an acute colitis- suspect radiation  Induced.  She is improving with supportive management,cultures  negative Advanced to regular diet today She is on Questran,bentyl and immodium-will adjust doses Complete empiric 10 day course of flagyl Start probiotic  Pt says hemorrhoids no better- will start sitz baths and change  creams   Active Problems:   History of ischemic colitis   Diabetes mellitus type 2, noninsulin dependent   Hypertension   Fibromyalgia   Breast cancer metastasized to bone   Other and unspecified noninfectious gastroenteritis and colitis(558.9)   Diarrhea   Dehydration     LOS: 3 days   Amy Esterwood  05/17/2013, 9:30 AM   GI ATTENDING  Patient personally seen and examined. Agree with above as outlined. Feeling better. Advancing diet. Hopefully home soon.  Wilhemina Bonito. Eda Keys., [name]. [facility] Division of Gastroenterology

## 2013-05-18 DIAGNOSIS — K589 Irritable bowel syndrome without diarrhea: Secondary | ICD-10-CM

## 2013-05-18 LAB — BASIC METABOLIC PANEL
BUN: 4 mg/dL — ABNORMAL LOW (ref 6–23)
Calcium: 8.9 mg/dL (ref 8.4–10.5)
Creatinine, Ser: 0.59 mg/dL (ref 0.50–1.10)
GFR calc Af Amer: >90 mL/min (ref >90)
GFR calc non Af Amer: 88 mL/min — ABNORMAL LOW (ref >90)

## 2013-05-18 LAB — CBC
MCHC: 33.8 g/dL (ref 30.0–36.0)
Platelets: 153 10*3/uL (ref 150–400)
RDW: 12.8 % (ref 11.5–15.5)
WBC: 4.6 10*3/uL (ref 4.0–10.5)

## 2013-05-18 MED ORDER — CHOLESTYRAMINE 4 G PO PACK: PACK | ORAL | Status: DC

## 2013-05-18 MED ORDER — METFORMIN HCL ER 500 MG PO TB24
500.0000 mg | ORAL_TABLET | Freq: Every day | ORAL | Status: DC
  Filled 2013-05-18: qty 1

## 2013-05-18 MED ORDER — SACCHAROMYCES BOULARDII 250 MG PO CAPS: 250.0000 mg | ORAL_CAPSULE | Freq: Two times a day (BID) | ORAL | Status: DC

## 2013-05-18 MED ORDER — POLYETHYLENE GLYCOL 3350 17 G PO PACK: 17.0000 g | PACK | Freq: Every day | ORAL | Status: DC

## 2013-05-18 MED ORDER — LIDOCAINE 5 % EX OINT: TOPICAL_OINTMENT | Freq: Four times a day (QID) | CUTANEOUS | Status: DC | PRN

## 2013-05-18 MED ORDER — HYDROCORTISONE 2.5 % RE CREA: TOPICAL_CREAM | Freq: Four times a day (QID) | RECTAL | Status: DC

## 2013-05-18 MED ORDER — POLYETHYLENE GLYCOL 3350 17 G PO PACK
17.0000 g | PACK | Freq: Once | ORAL | Status: AC
  Administered 2013-05-18: 17 g via ORAL
  Filled 2013-05-18: qty 1

## 2013-05-18 MED ORDER — METRONIDAZOLE 500 MG PO TABS: ORAL_TABLET | ORAL | Status: DC

## 2013-05-18 MED ORDER — DOCUSATE SODIUM 100 MG PO CAPS
100.0000 mg | ORAL_CAPSULE | Freq: Once | ORAL | Status: AC
  Administered 2013-05-18: 100 mg via ORAL
  Filled 2013-05-18 (×2): qty 1

## 2013-05-18 MED ORDER — METFORMIN HCL ER 500 MG PO TB24
500.0000 mg | ORAL_TABLET | Freq: Every day | ORAL | Status: DC
  Filled 2013-05-18 (×2): qty 1

## 2013-05-18 MED ORDER — METFORMIN HCL ER 500 MG PO TB24: ORAL_TABLET | ORAL | Status: DC

## 2013-05-18 MED ORDER — LOPERAMIDE HCL 2 MG PO CAPS: ORAL_CAPSULE | ORAL | Status: DC

## 2013-05-18 MED ORDER — SACCHAROMYCES BOULARDII 250 MG PO CAPS
250.0000 mg | ORAL_CAPSULE | Freq: Two times a day (BID) | ORAL | Status: DC
  Administered 2013-05-18: 250 mg via ORAL
  Filled 2013-05-18 (×2): qty 1

## 2013-05-18 NOTE — Progress Notes (Signed)
Patient ID: TANISHI NAULT, female   DOB: [birth year], 77 y.o.   MRN: [id number] [facility] Progress Note  Subjective: Doing better- Diarrhea has resolved, still has "upset stomach". Hoping to go home later today Hemorrhoids sxs improving as well  Objective:  Vital signs in last 24 hours: Temp:  [97.9 F (36.6 C)-98.1 F (36.7 C)] 98.1 F (36.7 C) (08/01 0517) Pulse Rate:  [62-66] 64 (08/01 0517) Resp:  [16-18] 16 (08/01 0517) BP: (128-140)/(47-51) 137/51 mmHg (08/01 0517) SpO2:  [95 %-97 %] 96 % (08/01 0751) Weight:  [148 lb 14.4 oz (67.541 kg)] 148 lb 14.4 oz (67.541 kg) (08/01 0517) Last BM Date: 05/17/13 General:   Alert,  Well-developed, WF  in NAD Heart:  Regular rate and rhythm; no murmurs Pulm;clear Abdomen:  Soft, mildly tender mid abd, and nondistended. Normal bowel sounds, without guarding, and without rebound.   Rectal; very small external hemorrhoid,digital exam not done Extremities:  Without edema. Neurologic:  Alert and  oriented x4;  grossly normal neurologically. Psych:  Alert and cooperative. Normal mood and affect.  Intake/Output from previous day: 07/31 0701 - 08/01 0700 In: 600 [P.O.:600] Out: -  Intake/Output this shift: Total I/O In: 240 [P.O.:240] Out: -   Lab Results:  Recent Labs  05/16/13 0438 05/17/13 0430 05/18/13 0430  WBC 5.4 4.3 4.6  HGB 11.1* 11.4* 11.3*  HCT 32.5* 33.2* 33.4*  PLT 129* 141* 153   BMET  Recent Labs  05/16/13 0438 05/17/13 0430 05/18/13 0430  NA 140 139 139  K 3.2* 3.5 3.8  CL 105 107 105  CO2 26 23 27   GLUCOSE 120* 127* 126*  BUN 3* <3* 4*  CREATININE 0.59 0.52 0.59  CALCIUM 8.6 8.8 8.9   LFT No results found for this basename: PROT, ALBUMIN, AST, ALT, ALKPHOS, BILITOT, BILIDIR, IBILI,  in the last 72 hours PT/INR No results found for this basename: LABPROT, INR,  in the last 72 hours Hepatitis Panel No results found for this basename: HEPBSAG, HCVAB, HEPAIGM, HEPBIGM,  in the last 72  hours  Assessment / Plan: #1   77 yo female with metastatic breast cancer  To bone with an acute colitis felt likely radiation induced She is improving- plan for discharge later today Meds as outlined yesterday #2 Symptomatic hemorrhoids- improving-continue sitz baths at home over next 4-5 days, lidocaine and Anusol 3-4 x daily  Follow up in office Dr. [name] as needed  Active Problems:   History of ischemic colitis   Diabetes mellitus type 2, noninsulin dependent   Hypertension   Fibromyalgia   Breast cancer metastasized to bone   Other and unspecified noninfectious gastroenteritis and colitis(558.9)   Diarrhea   Dehydration     LOS: 4 days   Amy Esterwood  05/18/2013, 9:48 AM   GI ATTENDING  Patient person seen and examined. Husband in room. Agree with interval of dense and note as outlined above. Overall the patient has improved. Some vague abdominal discomfort after eating. Has a history of chronic constipation requiring suppositories. Has not moved her bowels. She wants to try Bentyl for cramps. Discussed prospects of going home and convalescing. Husband a bit apprehensive.  Wilhemina Bonito. Eda Keys., [name]. [facility] Division of Gastroenterology

## 2013-05-18 NOTE — Progress Notes (Signed)
Discharge to home , no complaints of any pain or discomfort upon discharge. Discharge instructions and follow up appointments done and was given to the patient. PIV removed no s/s of swelling or infiltration noted.

## 2013-05-18 NOTE — Care Management Note (Signed)
    Page 1 of 1   05/18/2013     11:54:02 AM   CARE MANAGEMENT NOTE 05/18/2013  Patient:  Jamie [name], Jamie [name]   Account Number:  [id number]  Date Initiated:  05/18/2013  Documentation initiated by:  [facility]  Subjective/Objective Assessment:   ADMITTED W/ABD PAIN.GASTROENTERITIS.     Action/Plan:   FROM HOME W/SPOUSE.HAS PCP,PHARMACY.   Anticipated DC Date:  05/18/2013   Anticipated DC Plan:  HOME/SELF CARE      DC Planning Services  CM consult      Choice offered to / List presented to:             Status of service:  Completed, signed off Medicare Important Message given?   (If response is "NO", the following Medicare IM given date fields will be blank) Date Medicare IM given:   Date Additional Medicare IM given:    Discharge Disposition:  HOME/SELF CARE  Per UR Regulation:  Reviewed for med. necessity/level of care/duration of stay  If discussed at Long Length of Stay Meetings, dates discussed:    Comments:  05/18/13 Orby Tangen RN,BSN NCM [phone] NO D/C NEEDS OR ORDERS.

## 2013-05-18 NOTE — Discharge Summary (Signed)
Physician Discharge Summary  DISCHARGE SUMMARY   Patient ID: Jamie [name] MR#: [id number] DOB/AGE: 75-18-1937 75 y.o.   Attending Physician:Jamie [name]  Patient's Jamie [name]  Consults:Treatment Team:  Jamie Fredrickson, [name]** Dr Jamie [name]  Admit date: 05/14/2013 Discharge date: 05/18/2013  Discharge Diagnoses:  Active Problems:   History of ischemic colitis   Diabetes mellitus type 2, noninsulin dependent   Hypertension   Fibromyalgia   Breast cancer metastasized to bone   Other and unspecified noninfectious gastroenteritis and colitis(558.9)   Diarrhea   Dehydration   Patient Active Problem List   Diagnosis Date Noted  . Other and unspecified noninfectious gastroenteritis and colitis(558.9) 05/15/2013  . Diarrhea 05/15/2013  . Dehydration 05/15/2013  . Breast cancer metastasized to bone 08/24/2012  . Symptomatic PVCs 07/21/2012  . Diabetes mellitus type 2, noninsulin dependent   . Hypertension   . Meniere syndrome   . Fibromyalgia   . Personal history of breast cancer   . DEPRESSION 08/20/2008  . Mitral valve prolapse   . Personal history of colon polyps   . Anxiety disorder with panic attacks   . Asthma   . GERD   . Irritable bowel syndrome   . History of ischemic colitis    Past Medical History  Diagnosis Date  . Anxiety   . Asthma   . Breast cancer     metastasized  . Depression   . Diabetes mellitus   . GERD (gastroesophageal reflux disease)   . Hyperlipidemia   . Internal hemorrhoids   . Hx of adenomatous colonic polyps   . IBS (irritable bowel syndrome)   . Mitral valve prolapse   . Ischemic colitis   . Fibromyalgia   . Allergy   . Cataract     Discharged Condition: Stable   Discharge Medications:   Medication List    STOP taking these medications       azithromycin 1 G powder  Commonly known as:  ZITHROMAX     cholestyramine light 4 G packet  Commonly known as:  PREVALITE     fluconazole 150 MG tablet  Commonly  known as:  DIFLUCAN     predniSONE 10 MG tablet  Commonly known as:  DELTASONE      TAKE these medications       BYSTOLIC 5 MG tablet  Generic drug:  nebivolol  Take 1 tablet by mouth Daily.     cholestyramine 4 G packet  Commonly known as:  QUESTRAN  Take 1 packet by mouth 1(one) to 2 (two) times daily with a meal to help with diarrhea.  Hold if constipated.     clonazePAM 0.5 MG tablet  Commonly known as:  KLONOPIN  Take 0.5 mg by mouth 2 (two) times daily.     CRESTOR 5 MG tablet  Generic drug:  rosuvastatin  Take 1 tablet by mouth Daily.     D3 ADULT 1000 UNITS Chew  Generic drug:  Cholecalciferol  Chew 1,000 Units by mouth daily.     dicyclomine 10 MG capsule  Commonly known as:  BENTYL  Take 2 capsules (20 mg total) by mouth 3 (three) times daily before meals as needed for Abdominal cramping     fulvestrant 250 MG/5ML injection  Commonly known as:  FASLODEX  Inject 250 mg into the muscle every 30 (thirty) days. One injection each buttock over 1-2 minutes. Warm prior to use.     GAVISCON PO  Take 1 capsule by mouth daily.  glimepiride 1 MG tablet  Commonly known as:  AMARYL  Take 0.5 tablets (0.5 mg total) by mouth if am blood sugar is > 125 and eating well.  Watch for lows     hydrocortisone 2.5 % rectal cream  Commonly known as:  ANUSOL-HC  Place rectally 4 (four) times daily.     lidocaine 5 % ointment  Commonly known as:  XYLOCAINE  Apply topically 4 (four) times daily as needed.     lidocaine-hydrocortisone 3-0.5 % Crea  Commonly known as:  ANAMANTEL HC  Place 1 Applicatorful rectally 2 (two) times daily as needed for hemorrhoids and bleeding.     loperamide 2 MG capsule  Commonly known as:  IMODIUM  Take 1 capsule (2 mg total) by mouth daily BID prn Diarrhea.     meclizine 25 MG tablet  Commonly known as:  ANTIVERT  Take 1 tablet by mouth as needed. dizziness     metFORMIN 500 MG 24 hr tablet  Commonly known as:  GLUCOPHAGE-XR  Take 1  tablet (500 mg total) by mouth daily - Watch for Diarrhea - If having diarrhea - OK to hold this medicine.     metroNIDAZOLE 500 MG tablet  Commonly known as:  FLAGYL  Take 1 tablet (500 mg total) by mouth every 8 (eight) hours to be done after last dose on August 3rd     NASONEX 50 MCG/ACT nasal spray  Generic drug:  mometasone  1 spray by Nasal route at bedtime.     NEXIUM 40 MG capsule  Generic drug:  esomeprazole  Take 1 tablet by mouth Daily.     ondansetron 4 MG disintegrating tablet  Commonly known as:  ZOFRAN ODT  Take 1 tablet (4 mg total) by mouth every 8 (eight) hours as needed for nausea.     ONGLYZA 5 MG Tabs tablet  Generic drug:  saxagliptin HCl  Take 5 mg by mouth daily.     oxyCODONE-acetaminophen 5-325 MG per tablet  Commonly known as:  PERCOCET/ROXICET  Take 1 tablet by mouth every 4 (four) hours as needed for pain.     PROAIR HFA 108 (90 BASE) MCG/ACT inhaler  Generic drug:  albuterol     QVAR 40 MCG/ACT inhaler  Generic drug:  beclomethasone  Inhale 2 puffs into the lungs at bedtime.     saccharomyces boulardii 250 MG capsule  Commonly known as:  FLORASTOR  Take 1 capsule (250 mg total) by mouth 2 (two) times daily.        Hospital Procedures: Dg Chest 2 View  04/30/2013   *RADIOLOGY REPORT*  Clinical Data: Cough and chest pain.  CHEST - 2 VIEW  Comparison: 10/16/2011.  Findings: The cardiac silhouette, mediastinal and hilar contours are within normal limits and stable.  Chronic lung changes with upper lobe scarring and chronic bronchitic changes.  No acute pulmonary findings.  No pleural effusion.  Stable sclerotic metastatic bone lesions. Stable surgical changes from a left mastectomy.  IMPRESSION:  1.  Chronic lung changes. 2.  No acute cardiopulmonary findings. 3.  Stable sclerotic osseous metastatic disease.   Original Report Authenticated By: Jamie [name], [name].D.   Dg Abd 1 View  05/16/2013   *RADIOLOGY REPORT*  Clinical Data: colitis and diarrhea  for two weeks  ABDOMEN - 1 VIEW  Comparison: ct scan 05/14/13  Findings: Diffuse blastic skeletal metastasis again noted. Recently administered oral contrast seen throughout colon, which is not dilated.  No abnormal bowel dilitation.  IMPRESSION: Nonobstructive bowel  gas pattern.   Original Report Authenticated By: Esperanza Heir, [name].D.   Ct Abdomen Pelvis W Contrast  05/14/2013   *RADIOLOGY REPORT*  Clinical Data: Diarrhea, abdominal pain.  Abdominal cramping.  CT ABDOMEN AND PELVIS WITH CONTRAST  Technique:  Multidetector CT imaging of the abdomen and pelvis was performed following the standard protocol during bolus administration of intravenous contrast.  Contrast: OMNIPAQUE IOHEXOL 300 MG/ML  SOLN, 50mL OMNIPAQUE IOHEXOL 300 MG/ML  SOLN  Comparison: CT 08/24/2012.  MRI 04/01/2013.  Findings: Lung Bases: Lung bases appears similar to prior with nodular scarring in the right middle lobe and scattered areas of dependent atelectasis.  Left mastectomy is incidentally noted.  Liver:  Normal.  Spleen:  Normal.  Gallbladder:  Contracted.  No calcified stones or inflammatory changes.  Common bile duct:  Normal.  Pancreas:  Normal.  Adrenal glands:  Normal bilaterally.  Kidneys:  Normal enhancement and delayed excretion of contrast. Punctate nonobstructing right renal collecting system calculi are present.  Left ureter can be followed to the urinary bladder appears normal.  Right ureter is also normal.  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  Stomach:  Normal.  Small bowel:  Normal.  No inflammatory changes or dilation.  The duodenum is within normal limits.  There is no mesenteric adenopathy.  Colon:   Normal appendix.  Nonspecific fluid levels are present within the colon, most commonly associated with enteric infection. This correlates with the clinical history of diarrhea.  Thickening of the distal sigmoid extending up to the rectum is present, compatible with colitis.   Diverticulosis without diverticulitis.  Pelvic Genitourinary:  Hysterectomy.  Urinary bladder is partially decompressed and appears within normal limits.  Bones:  Diffuse sclerotic osteoblastic metastases are present.  No pathologic fracture is identified.  Vasculature: Mild atherosclerosis is present.  Portal vein appears patent. Visceral vessels appear patent.  The IMA is patent.  Body Wall: Diffuse infiltration of the gluteal subcutaneous fat compatible with prior injections.  IMPRESSION:  1.  Findings compatible with colitis.  Sigmoid mural thickening with diffuse colonic air fluid levels. Be due to ischemic colitis, infection with inflammatory bowel disease considered unlikely in the absence of significant clinical history. 2.  Nonobstructing punctate right renal collecting system calculi with congenital right UPJ obstruction. 3.  Left mastectomy with sclerotic bone metastases.   Original Report Authenticated By: Andreas Newport, [name].D.   Dg Knee Complete 4 Views Left  04/19/2013   *RADIOLOGY REPORT*  Clinical Data: Pain and swelling in the left knee for several weeks.  No known injury  LEFT KNEE - COMPLETE 4+ VIEW  Comparison: None.  Findings: Bone density is within normal limits for age.  There is mild medial joint space narrowing identified.  No significant osteophytosis is identified.  No acute fracture or dislocation is seen.  No abnormal periarticular calcification or signs of chondrocalcinosis are noted.  No joint effusion is seen.  Soft tissue planes appear maintained.  IMPRESSION: Mild medial joint space narrowing.  Otherwise negative   Original Report Authenticated By: Rhodia Albright, [name].D.    History of Present Illness: 18 F With met Breast CA S/p radiation to hip and lower spine = a total of 12 radiation treatments, last treatment was 7/25. @ 7/14 she was treated with a z-pack and prednisone along wityh a breathing tx at her Asthma/Pulm Dr's office. She started developing Diarrhea @ 2 weeks prior  to admission. Started @ 1-3 bouts per day. Initially controlled on Imodium. Stomach  was rolling. Sxs progressed and she presented to ED 7/26 c Ab Pain, Bloating, Cramping, and Diarrhea. Very little blood was noted. Imodium and Cholestyramine stopped working. Patient has been taking one half of a Percocet 1-2 times per day with little relief. Denied fever, confusion, disorientation, chest pain, dyspnea,syncope, vomiting, or anorexia. + fatigued. Cough and wheezes were better. Patient felt substantially better and D/ced from the ED. 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 difficile colitis considered unlikely. Patient's labs were reassuring. She had some improvement with Zofran. With no leukocytosis, fever, or peritoneal findings, CT imaging was felt not to be indicated at 7/26 ED Visit. Stool came back (-) for C dif, CMET and CBC were fine. Her GI doctor is Dr Lina Sar. I believe she was D/Ced on Flagyl empirically. She came back to ED 7/29 and admitted for [facility] (despite trying to stay hydrated drinking 2L of ginger ale and large bottle of Gatorade), and continued Diarrhea. CT Ab showed 1. Findings compatible with colitis. Sigmoid mural thickening with diffuse colonic air fluid levels. Be due to ischemic colitis, infection with inflammatory bowel disease considered unlikely in the absence of significant clinical history. 2. Nonobstructing punctate right renal collecting system calculi with congenital right UPJ obstruction. 3. Left mastectomy with sclerotic bone metastases. Labs unremarkable except for mild Hypokalemia. She was made NPO x sips and chips, getting IVF, Getting IV Cipro/Flagyl. Continue Sx control. Diarrhea - Several times a day, sometimes associated with Blood, + Bloating. Food makes it worse.    Hospital Course:  1. Colitis - Admitted and given supportive care.  Initially NPO x sips/chips and advanced to full liq then reg diet (7/31).  She did well with this.  IVF was given and  corrected her DeH.  Her Diarrhea was treated with imodium and Cholestyramine.  The Colitis was treated with empiric Flagyl and initially cipro.  The Cipro was D/ced when felt not necessary (infectious unlikely). Radiatiation Proctitis is the most probable Dx.  GI saw her and felt no scope was necessary.  GI suspected radiation induced injury to bowel though ischemia or other etiologies not completely excluded. Overall volume of stool / blood improved daily and now she has not had a BM in > 24 hrs. She had a slight drop in hgb and then it stabilized.  Hbg 11.3 today.  She never had a fever or leukocytosis.  The persistent cramps and tenesmus are better.  We will continue the Bentyl for a little while.  We will finish 10 days of the Flagyl - started 7/25 - end Aug 3. We have had to wean the BID Questran and BID imodium/prn imodium as she improved. We continued hemorrhoidal rx. It was always felt that if there was no improvement she would need a flex sig or colonoscopy. This was not necessary during this hospital stay. GI and Onc Evals were appreciated. Continue Sx management and supportive care.  Colonoscopy 1 year ago essentially unremarkable.  Stool studies for C. difficile negative.  Continue probiotics.   Seen on am rounds 8/1 for F/Up colitis. No longer crampy. Tolerating Solids.  No Diarrhea.  No blood.  Bloating is better.  Now "constipated". Meds adjusted. (Glucophage restarted).  IVF stopped yesterday.  Anticipate D/c after lunch.  Hbg stable.  No new C/O   2. Breast cancer metastasized to bone - S/P XRT. Appreciate Dr Princella Pellegrini comments:  (1) status post left modified radical mastectomy in January 2007 for a T1c N1, stage IIA invasive  lobular carcinoma, grade 1, strongly estrogen and progesterone receptor positive, HER-2 negative, with an MIB-1 of 7%.  (2) Status post adjuvant radiation to the left chest wall given concurrently with Capecitabine. Declined chemotherapy.  (3) Status post  anastrozole and exemestane, both discontinued due to aches and pains. Status post tamoxifen between October 2007 until August 2011. Discontinued secondary to cramps  (4) multiple sclerotic bony lesions noted at initial diagnosis, biopsied x2 August 2009 without malignancy documented, on zoledronic acid starting 12/12/2007, currently given monthly, most recent dose 05/14/2013  (5) On fulvestrant since September 2011, with good tolerance  (6) radiation to the lumbosacral spine and right hip to treat right hip pain thought to be associated with bony metastasis completed 05/10/2013 under the care of Dr. Michell Heinrich.  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 She will follow up Onc office 06/14/2013.   3. Hypokalemia - Repleted.  4. Anxiety/Depression/panic - on meds.  5. Lipids - Restart Crestor on D/C  6 Fibromyalgia  7. DVT Proph. Lovenox to SCDs  8. Asthma - Q Var  9 GERD - PPI  10 DM2 - ISS. OK to restart Metfomin, continue Tradjenta. ISS - CBGs ok. 11. hemorrhoids - sitz baths and creams 12. Anemia - hbg stable at 11.3-11.4    Day of Discharge Exam BP 137/51  Pulse 64  Temp(Src) 98.1 F (36.7 C) (Oral)  Resp 16  Ht 5\' 4"  (1.626 [name])  Wt 67.541 kg (148 lb 14.4 oz)  BMI 25.55 kg/m2  SpO2 95%  Physical Exam: General appearance: better  Resp: CTA B  Cardio: Reg  GI: Better.  No Bloat. Non tender. BS improved. No Reb/Guarding.  Extremities: extremities normal, atraumatic, no cyanosis or edema  Pulses: 2+ and symmetric  Lymph nodes: No cervical lymphadenopathy  Neurologic: Alert and oriented X 3, normal strength and tone. Normal symmetric reflexes.    Discharge Labs:  Recent Labs  05/17/13 0430 05/18/13 0430  NA 139 139  K 3.5 3.8  CL 107 105  CO2 23 27  GLUCOSE 127* 126*  BUN <3* 4*  CREATININE 0.52 0.59  CALCIUM 8.8 8.9   No results found for this basename: AST, ALT, ALKPHOS, BILITOT, PROT,  ALBUMIN,  in the last 72 hours  Recent Labs  05/17/13 0430 05/18/13 0430  WBC 4.3 4.6  HGB 11.4* 11.3*  HCT 33.2* 33.4*  MCV 94.6 94.4  PLT 141* 153   No results found for this basename: CKTOTAL, CKMB, CKMBINDEX, TROPONINI,  in the last 72 hours No results found for this basename: TSH, T4TOTAL, FREET3, T3FREE, THYROIDAB,  in the last 72 hours No results found for this basename: VITAMINB12, FOLATE, FERRITIN, TIBC, IRON, RETICCTPCT,  in the last 72 hours No results found for this basename: INR,  PROTIME       Discharge instructions:  Future Appointments Provider Department Dept Phone   06/14/2013 8:00 AM Krista Blue [facility] [phone]   06/14/2013 8:15 AM [facility] Inj Nurse [place] [facility] [phone]   07/12/2013 10:15 AM Mauri Brooklyn [facility] [phone]   07/12/2013 10:45 AM Amy Allegra Grana, PA-C [place] [facility] [phone]   07/12/2013 11:30 AM [facility] Flush Nurse [place] [facility] [phone]     01-Home or Self Care Follow-up Information   Follow up with Gwen Pounds, [name] In 7 days.   Contact information:  [address] [facility] [place] [address] [phone]       Schedule an appointment as soon as possible for a visit with Lowella Dell, [name].   Contact information:   [address] [place] [address] [phone]        Disposition: home  Follow-up Appts: Follow-up with Dr. Timothy Lasso at [facility] in 1-2 weeks.  Call for appointment.  Condition on Discharge: Stable  Tests Needing Follow-up: CBC/BMET in 1-2 weeks.  Time spent in discharge (includes decision making & examination of pt): 35 min  Signed: Quinnton Bury [name] 05/18/2013, 7:43 AM

## 2013-05-21 ENCOUNTER — Other Ambulatory Visit: Payer: Self-pay | Admitting: Oncology

## 2013-05-23 ENCOUNTER — Telehealth: Payer: Self-pay | Admitting: *Deleted

## 2013-05-23 ENCOUNTER — Telehealth: Payer: Self-pay | Admitting: Oncology

## 2013-05-23 NOTE — Telephone Encounter (Signed)
Called and spoke with patient asking how she was doing since she was discharged from the hospital last Friday 05/18/13 ;she was feeling good yesterday but  Didn't sleep last night abdomen rolling all night, cramping, lower and mid abdomen,  took gas-ex yesterday not helping, on Flagyl, has only has one left, I'm so weak, I called  Dr.Russo office this morning and he is out of the office, I don't know what I'm going to do when I run out of the flagyl", asked if she wanted to come in and see Dr. Michell Heinrich tomorrow as a follow up,"Yes I would, and tell her she missed all this last Jeri Lager' thanked Md for having me call and check on her,transferred call to [place] and made follow up appt tomorrow 9:27 AM

## 2013-05-23 NOTE — Telephone Encounter (Signed)
, °

## 2013-05-24 ENCOUNTER — Ambulatory Visit
Admission: RE | Admit: 2013-05-24 | Discharge: 2013-05-24 | Disposition: A | Discharge status: Home / Self Care | Payer: Medicare Other | Source: Ambulatory Visit | Attending: Radiation Oncology | Admitting: Radiation Oncology

## 2013-05-24 ENCOUNTER — Telehealth: Payer: Self-pay | Admitting: *Deleted

## 2013-05-24 ENCOUNTER — Encounter: Payer: Self-pay | Admitting: Radiation Oncology

## 2013-05-24 VITALS — BP 147/76 | HR 73 | Temp 98.5°F | Resp 20 | Wt 151.1 lb

## 2013-05-24 DIAGNOSIS — C7952 Secondary malignant neoplasm of bone marrow: Secondary | ICD-10-CM | POA: Insufficient documentation

## 2013-05-24 DIAGNOSIS — K589 Irritable bowel syndrome without diarrhea: Secondary | ICD-10-CM | POA: Insufficient documentation

## 2013-05-24 DIAGNOSIS — C7951 Secondary malignant neoplasm of bone: Secondary | ICD-10-CM

## 2013-05-24 DIAGNOSIS — R197 Diarrhea, unspecified: Secondary | ICD-10-CM

## 2013-05-24 DIAGNOSIS — C50919 Malignant neoplasm of unspecified site of unspecified female breast: Secondary | ICD-10-CM | POA: Insufficient documentation

## 2013-05-24 MED ORDER — ONDANSETRON HCL 4 MG PO TABS
4.0000 mg | ORAL_TABLET | Freq: Once | ORAL | Status: AC
  Administered 2013-05-24: 4 mg via ORAL
  Filled 2013-05-24: qty 1

## 2013-05-24 NOTE — Telephone Encounter (Signed)
Left vm for Jamie [name] to leas call patient tomorrow at her home, check on her recent d/c hospital

## 2013-05-24 NOTE — Progress Notes (Signed)
Department of Radiation Oncology  Phone:  [phone] Fax:        [phone]   Name: Jamie [name] MRN: [id number]  DOB: 07/20/1936  Date: 05/24/2013  Follow Up Visit Note  Diagnosis: Resumed metastatic breast cancer to the lower lumbar spine and sacrum  Summary and Interval since last radiation: 30 gray completed 05/11/2013  Interval History: Jamie [name] presents today for routine followup.  She was hospitalized after her last radiation treatment for colitis. It she has been slowly improving. She was discharged from the hospital on the first and still having diarrhea with most things that she eats. She is also unfortunately had at flareup of her hemorrhoids secondary to the diarrhea. She states she is taking Imodium but she scared becoming constipated which happened when she was in the hospital. She had some cough this morning which "ran right through her". She is seeing her primary care physician tomorrow. Hurt she states her lower abdomen is feeling better but it's moved into her epigastric area. She is eating things like yogurt and crackers. She did have a prescription for Zofran but was unable to afford this. She requested something for nausea today in clinic but did not want any other prescriptions to take at home. She is also quite tearful on the stress this is causing her husband.  Allergies:  Allergies  Allergen Reactions  . Niacin Other (See Comments)    Dizziness   . Promethazine Hcl Other (See Comments)    Dizziness "FELT CRAZY"     Medications:  Current Outpatient Prescriptions  Medication Sig Dispense Refill  . Alum Hydroxide-Mag Carbonate (GAVISCON PO) Take 1 capsule by mouth daily.      . beclomethasone (QVAR) 40 MCG/ACT inhaler Inhale 2 puffs into the lungs at bedtime.      Marland Kitchen BYSTOLIC 5 MG tablet Take 1 tablet by mouth Daily.      . Cholecalciferol (D3 ADULT) 1000 UNITS CHEW Chew 1,000 Units by mouth daily.      . clonazePAM (KLONOPIN) 0.5 MG tablet Take 0.5  mg by mouth 2 (two) times daily.        . CRESTOR 5 MG tablet Take 1 tablet by mouth Daily.      Marland Kitchen dicyclomine (BENTYL) 10 MG capsule Take 2 capsules (20 mg total) by mouth 3 (three) times daily before meals as needed for Abdominal cramping  30 capsule  0  . fulvestrant (FASLODEX) 250 MG/5ML injection Inject 250 mg into the muscle every 30 (thirty) days. One injection each buttock over 1-2 minutes. Warm prior to use.      Marland Kitchen glimepiride (AMARYL) 1 MG tablet Take 0.5 tablets (0.5 mg total) by mouth if am blood sugar is > 125 and eating well.  Watch for lows      . hydrocortisone (ANUSOL-HC) 2.5 % rectal cream Place rectally 4 (four) times daily.  30 g  0  . lidocaine (XYLOCAINE) 5 % ointment Apply topically 4 (four) times daily as needed.  35.44 g  0  . loperamide (IMODIUM) 2 MG capsule Take 1 capsule (2 mg total) by mouth daily BID prn Diarrhea.  30 capsule  0  . meclizine (ANTIVERT) 25 MG tablet Take 1 tablet by mouth as needed. dizziness      . metFORMIN (GLUCOPHAGE-XR) 500 MG 24 hr tablet Take 1 tablet (500 mg total) by mouth daily - Watch for Diarrhea - If having diarrhea - OK to hold this medicine.      Marland Kitchen NASONEX 50 MCG/ACT  nasal spray 1 spray by Nasal route at bedtime.      Marland Kitchen NEXIUM 40 MG capsule Take 1 tablet by mouth Daily.      . ONGLYZA 5 MG TABS tablet Take 5 mg by mouth daily.       Marland Kitchen oxyCODONE-acetaminophen (PERCOCET/ROXICET) 5-325 MG per tablet Take 1 tablet by mouth every 4 (four) hours as needed for pain.  50 tablet  0  . cholestyramine (QUESTRAN) 4 G packet Take 1 packet by mouth 1(one) to 2 (two) times daily with a meal to help with diarrhea.  Hold if constipated.  60 each  3  . gabapentin (NEURONTIN) 100 MG capsule Take 100 mg by mouth.      . lidocaine-hydrocortisone (ANAMANTEL HC) 3-0.5 % CREA Place 1 Applicatorful rectally 2 (two) times daily as needed for hemorrhoids and bleeding.  28.35 g  0  . metroNIDAZOLE (FLAGYL) 500 MG tablet Take 1 tablet (500 mg total) by mouth every 8  (eight) hours to be done after last dose on August 3rd      . ondansetron (ZOFRAN ODT) 4 MG disintegrating tablet Take 1 tablet (4 mg total) by mouth every 8 (eight) hours as needed for nausea.  10 tablet  0  . PROAIR HFA 108 (90 BASE) MCG/ACT inhaler       . saccharomyces boulardii (FLORASTOR) 250 MG capsule Take 1 capsule (250 mg total) by mouth 2 (two) times daily.  20 capsule  0   No current facility-administered medications for this encounter.    Physical Exam:  Filed Vitals:   05/24/13 1346  BP: 147/76  Pulse: 73  Temp: 98.5 F (36.9 C)  Resp: 20   she is a pleasant female in mild distress laying on the exam room table. She has abdominal tenderness to deep palpation. She has no rebound. Her tenderness is worse in the lower outer quadrant on the right side and the epigastric region. She has normal bowel sounds. Her weight is stable.  IMPRESSION: Jamie [name] is a 77 y.o. female status post palliative radiation to her sacrum and lumbar spine with her post treatment course complicated by a hospitalization for colitis  PLAN:  She was able to tolerate ginger ale in clinic today. I gave her 4 mg of Zofran to help as well. We discussed continuing to use Imodium as well as following a bland diet. This may be a side effect of radiation on top of her underlying irritable bowel disease. She states she has had colitis in the past when she is taking and prednisone and of course she was on prednisone for several days to 2 a breast for infection earlier. I will plan on seeing her back in 2 weeks. I encouraged her to keep orally rehydrating is much as possible. I also filled out paperwork for some assistance for gas cards for her and her husband.    Lurline Hare, MD

## 2013-05-24 NOTE — Progress Notes (Addendum)
Follow up  Bone mets L-spine-sacrum, 04/26/13-05/11/13, recent d/c hospital 18/1/14, still having diarrhea any food drinks/broth stated, but able to keep a scrambled egg sandwich down yesterday, stopped flagyl yesteray, siz baths daily helps a lot, see her Primary MD tomorrow Dr.Russo, weak, fatigued, had broth this am and immediately had diarrhea,  Cramping in top of abdomen, slight nausea today stated,  Still has hemorrhoids, asked if still has her gallbladder yes,stated pateint and GI MD saw her when she was in the hospital , pain in abdomen a 6 on a 1-10 scale, able to keep her medicatins down 1:56 PM   1:55 PM gingerale soda offered and 4mg  Zofran per written and verbal orders  2:27 PM

## 2013-05-25 ENCOUNTER — Encounter: Payer: Self-pay | Admitting: *Deleted

## 2013-05-25 NOTE — Progress Notes (Signed)
Clinical Social Work received referral to "check up" with patient after recent hospitalization.  CSW attempted to reach patient, left voicemail to return call when convenient.  Kathrin Penner, MSW, LCSW Clinical Social Worker [facility] [phone]

## 2013-06-01 ENCOUNTER — Encounter: Payer: Self-pay | Admitting: Radiation Oncology

## 2013-06-04 ENCOUNTER — Other Ambulatory Visit (HOSPITAL_BASED_OUTPATIENT_CLINIC_OR_DEPARTMENT_OTHER): Payer: Medicare Other | Admitting: Lab

## 2013-06-04 ENCOUNTER — Other Ambulatory Visit: Payer: Self-pay

## 2013-06-04 ENCOUNTER — Ambulatory Visit (HOSPITAL_BASED_OUTPATIENT_CLINIC_OR_DEPARTMENT_OTHER): Payer: Medicare Other | Admitting: Oncology

## 2013-06-04 ENCOUNTER — Telehealth: Payer: Self-pay | Admitting: Oncology

## 2013-06-04 VITALS — BP 147/73 | HR 80 | Temp 98.3°F | Resp 20 | Ht 64.0 in | Wt 145.7 lb

## 2013-06-04 DIAGNOSIS — Z170 Estrogen receptor positive status [ER+]: Secondary | ICD-10-CM

## 2013-06-04 DIAGNOSIS — C50219 Malignant neoplasm of upper-inner quadrant of unspecified female breast: Secondary | ICD-10-CM

## 2013-06-04 DIAGNOSIS — C7951 Secondary malignant neoplasm of bone: Secondary | ICD-10-CM

## 2013-06-04 DIAGNOSIS — Z853 Personal history of malignant neoplasm of breast: Secondary | ICD-10-CM

## 2013-06-04 DIAGNOSIS — R197 Diarrhea, unspecified: Secondary | ICD-10-CM

## 2013-06-04 DIAGNOSIS — Z1231 Encounter for screening mammogram for malignant neoplasm of breast: Secondary | ICD-10-CM

## 2013-06-04 DIAGNOSIS — M25559 Pain in unspecified hip: Secondary | ICD-10-CM

## 2013-06-04 DIAGNOSIS — R5381 Other malaise: Secondary | ICD-10-CM

## 2013-06-04 LAB — CBC WITH DIFFERENTIAL/PLATELET
BASO%: 1.1 % (ref 0.0–2.0)
Basophils Absolute: 0 10*3/uL (ref 0.0–0.1)
EOS%: 10.5 % — ABNORMAL HIGH (ref 0.0–7.0)
HCT: 36.7 % (ref 34.8–46.6)
HGB: 12.5 g/dL (ref 11.6–15.9)
LYMPH%: 10.8 % — ABNORMAL LOW (ref 14.0–49.7)
MCH: 32.6 pg (ref 25.1–34.0)
MCHC: 34.1 g/dL (ref 31.5–36.0)
MONO#: 0.5 10*3/uL (ref 0.1–0.9)
NEUT%: 63.5 % (ref 38.4–76.8)
Platelets: 238 10*3/uL (ref 145–400)
lymph#: 0.4 10*3/uL — ABNORMAL LOW (ref 0.9–3.3)

## 2013-06-04 LAB — COMPREHENSIVE METABOLIC PANEL (CC13)
ALT: 16 U/L (ref 0–55)
BUN: 9.7 mg/dL (ref 7.0–26.0)
CO2: 28 mEq/L (ref 22–29)
Calcium: 9.3 mg/dL (ref 8.4–10.4)
Chloride: 103 mEq/L (ref 98–109)
Creatinine: 0.7 mg/dL (ref 0.6–1.1)
Total Bilirubin: 0.23 mg/dL (ref 0.20–1.20)

## 2013-06-04 NOTE — Progress Notes (Signed)
ID: [name]   DOB: 09/07/1936  MR#: [id number]  WUJ#:[id number]  PCP: Gwen Pounds, MD GYN: Richardean Chimera SU: OTHER MD: Jeananne Rama, Lina Sar  HISTORY OF PRESENT ILLNESS: The patient's cancer was uncovered by an unusually circuitous route. She presented with stomach upset.  Because of a history of colitis, Dr. Dorena Cookey who happened to be on call for Dr. Matthias Hughs, obtained CT of the abdomen and pelvis on August 30, 2005.  As far as the abdomen and pelvis were concerned, there was no evidence of colitis but there were multiple scattered sclerotic lesions in the bone suggesting possible sclerotic metastatic disease (versus osteopoikilosis.)  Accordingly a bone scan was obtained.  This was done on September 01, 2005, and found tiny sclerotic lesions, and in particular a lesion of concern in the sternum.  Accordingly a CT scan of the chest was obtained.  This found the sternal problem to correspond with degenerative changes. However, it also showed a soft tissue nodule in the left breast.  The patient then had mammograms on September 27, 2005.  This found two areas of spiculation and architectural distortion in the upper outer quadrant of the left breast.  By ultrasound these were hyperechoic and irregular, highly suspicious for carcinoma.  Biopsy was obtained the same day and showed ([id number], as well as [id number]) invasive lobular carcinoma, with both lesions biopsied being ER and PR positive, both herceptest positive, both negative by FISH.  Accordingly the patient was referred to Dr. Corliss Skains and breast MRI was obtained October 06, 2006.  No abnormal enhancement was noted in the right breast.  There was enhancing nodularity in the upper portion of the left breast where several discrete nodules were seen.  Accordingly Dr. Corliss Skains performed a left modified radical mastectomy on November 02, 2005.  The patient had a positive sentinel lymph node and Dr. Corliss Skains performed a left axillary lymph  node dissection.  However, only three additional lymph nodes were recovered, two of which also showed metastatic involvement (all had extra capsular extension.)  The patient's subsequent history is as detailed below  INTERVAL HISTORY: [name] returns today for  followup of her metastatic breast carcinoma. Since her last visit here she completed her radiation to the lower back and right hip area, complicated by colitis. She had significant diarrhea, MR Royal bleeding, abdominal cramps, requiring hospitalization.  REVIEW OF SYSTEMS: She tells me she has had no diarrhea now for 2 days. She is breakfast and lunch just fine, but are at suppertime she starts getting abdominal cramps. She doesn't need much at night. She does taken oxycodone or half an oxycodone at that point to "help things along". She has not been constipated from this. She has had a mild sinus problems, feels anxious and depressed, feel she needs to get out of the house more but is very difficult for her because she doesn't have her on call her and it is very difficult for her husband to drive or for that matter to walk. She is fatigued, likely secondary to the recent radiation. She does say the pain in the right hip and lower back is much better although not entirely resolved. A detailed review of systems today is otherwise stable   PAST MEDICAL HISTORY: Past Medical History  Diagnosis Date  . Anxiety   . Asthma   . Breast cancer     metastasized  . Depression   . Diabetes mellitus   . GERD (gastroesophageal reflux disease)   . Hyperlipidemia   .  Internal hemorrhoids   . Hx of adenomatous colonic polyps   . IBS (irritable bowel syndrome)   . Mitral valve prolapse   . Ischemic colitis   . Fibromyalgia   . Allergy   . Cataract   . S/P radiation therapy within four to twelve weeks 04/26/13-05/11/13    L-spine/sacrum 30Gy/78fx  Significant for diabetes, hypercholesterolemia, asthma, mitral valve prolapse requiring antibiotic  prophylaxis before any invasive procedures, Meniere's disease, gastroesophageal reflux disease, osteoarthritis, degenerative disk disease, history of fibromyalgia, panic attacks, history of synovial cyst removal, history of total hysterectomy with bilateral salpingo-oophorectomy, history of septoplasty x2, status post appendectomy, status post dilatation and curettage remotely, status post bilateral carpal tunnel release under Dr. Rosanne Ashing [place].  PAST SURGICAL HISTORY: Past Surgical History  Procedure Laterality Date  . Mastectomy      left  and then treated with chemo and radiation  . Total abdominal hysterectomy    . Back surgery      synovial cyst removed from spine  . Carpal tunnel release      bilateral  . Dilation and curettage of uterus    . Appendectomy    . Cataract surgery Bilateral     FAMILY HISTORY The patient's father died at the age of 68 from a stroke.  The patient's mother died at the age of 30, also from a stroke.  The patient has three brothers; one died with "liver disease."  One has prostate cancer.  The other brother has prostate and colon cancer.  The only breast cancer in the family was the patient's mother's mother and she does not know how old her grandmother was when she was diagnosed.  There is no history of ovarian cancer in the family.  GYNECOLOGIC HISTORY: She is GXP2.  She had her hysterectomy while still menstruating and she took hormone replacement until December 2006, when she had her breast biopsy.  SOCIAL HISTORY: She used to work as a Lawyer.  Her husband [name] (who is the son of my former patient Syrianna Schillaci) is semiretired, working for a Financial risk analyst.  What he likes to do is hunt and fish.  Their son Iantha Fallen had significant muscular dystrophy and died in 2007-02-26. Their daughter Marylu Lund lives in [place] and works in an office.  The patient has two granddaughters, both RNs, one working at [employer] and the other in [place].    ADVANCED  DIRECTIVES:  HEALTH MAINTENANCE: History  Substance Use Topics  . Smoking status: Never Smoker   . Smokeless tobacco: Never Used  . Alcohol Use: No     Colonoscopy:  PAP:  Bone density:  Lipid panel:  Allergies  Allergen Reactions  . Niacin Other (See Comments)    Dizziness   . Promethazine Hcl Other (See Comments)    Dizziness "FELT CRAZY"     Current Outpatient Prescriptions  Medication Sig Dispense Refill  . Alum Hydroxide-Mag Carbonate (GAVISCON PO) Take 1 capsule by mouth daily.      . beclomethasone (QVAR) 40 MCG/ACT inhaler Inhale 2 puffs into the lungs at bedtime.      Marland Kitchen BYSTOLIC 5 MG tablet Take 1 tablet by mouth Daily.      . Cholecalciferol (D3 ADULT) 1000 UNITS CHEW Chew 1,000 Units by mouth daily.      . cholestyramine (QUESTRAN) 4 G packet Take 1 packet by mouth 1(one) to 2 (two) times daily with a meal to help with diarrhea.  Hold if constipated.  60 each  3  . clonazePAM (KLONOPIN)  0.5 MG tablet Take 0.5 mg by mouth 2 (two) times daily.        . CRESTOR 5 MG tablet Take 1 tablet by mouth Daily.      Marland Kitchen dicyclomine (BENTYL) 10 MG capsule Take 2 capsules (20 mg total) by mouth 3 (three) times daily before meals as needed for Abdominal cramping  30 capsule  0  . fulvestrant (FASLODEX) 250 MG/5ML injection Inject 250 mg into the muscle every 30 (thirty) days. One injection each buttock over 1-2 minutes. Warm prior to use.      . gabapentin (NEURONTIN) 100 MG capsule Take 100 mg by mouth.      Marland Kitchen glimepiride (AMARYL) 1 MG tablet Take 0.5 tablets (0.5 mg total) by mouth if am blood sugar is > 125 and eating well.  Watch for lows      . hydrocortisone (ANUSOL-HC) 2.5 % rectal cream Place rectally 4 (four) times daily.  30 g  0  . lidocaine (XYLOCAINE) 5 % ointment Apply topically 4 (four) times daily as needed.  35.44 g  0  . lidocaine-hydrocortisone (ANAMANTEL HC) 3-0.5 % CREA Place 1 Applicatorful rectally 2 (two) times daily as needed for hemorrhoids and bleeding.   28.35 g  0  . loperamide (IMODIUM) 2 MG capsule Take 1 capsule (2 mg total) by mouth daily BID prn Diarrhea.  30 capsule  0  . meclizine (ANTIVERT) 25 MG tablet Take 1 tablet by mouth as needed. dizziness      . metFORMIN (GLUCOPHAGE-XR) 500 MG 24 hr tablet Take 1 tablet (500 mg total) by mouth daily - Watch for Diarrhea - If having diarrhea - OK to hold this medicine.      . metroNIDAZOLE (FLAGYL) 500 MG tablet Take 1 tablet (500 mg total) by mouth every 8 (eight) hours to be done after last dose on August 3rd      . NASONEX 50 MCG/ACT nasal spray 1 spray by Nasal route at bedtime.      Marland Kitchen NEXIUM 40 MG capsule Take 1 tablet by mouth Daily.      . ondansetron (ZOFRAN ODT) 4 MG disintegrating tablet Take 1 tablet (4 mg total) by mouth every 8 (eight) hours as needed for nausea.  10 tablet  0  . ONGLYZA 5 MG TABS tablet Take 5 mg by mouth daily.       Marland Kitchen oxyCODONE-acetaminophen (PERCOCET/ROXICET) 5-325 MG per tablet Take 1 tablet by mouth every 4 (four) hours as needed for pain.  50 tablet  0  . PROAIR HFA 108 (90 BASE) MCG/ACT inhaler       . saccharomyces boulardii (FLORASTOR) 250 MG capsule Take 1 capsule (250 mg total) by mouth 2 (two) times daily.  20 capsule  0   No current facility-administered medications for this visit.    OBJECTIVE: Middle-aged white woman who was tearful today Filed Vitals:   06/04/13 1231  BP: 147/73  Pulse: 80  Temp: 98.3 F (36.8 C)  Resp: 20     Body mass index is 25 kg/(m^2).    ECOG FS: 1 Filed Weights   06/04/13 1231  Weight: 145 lb 11.2 oz (66.089 kg)   Sclerae unicteric, pupils equal round and reactive to light Oropharynx clear, no thrush No cervical, supraclavicular, or axillary adenopathy Lungs clear to auscultation, no rales or rhonchi Heart regular rate and rhythm Abd soft, nontender, increased bowel sounds MSK no focal spinal tenderness, including the lower spine; no peripheral edema Neuro: nonfocal, well oriented, depressed affect  Breasts:  The right breast shows no suspicious findings. The left breast is status post mastectomy and radiation. There is no evidence of local recurrence. The left axilla is benign.  LAB RESULTS: Lab Results  Component Value Date   WBC 3.8* 06/04/2013   NEUTROABS 2.4 06/04/2013   HGB 12.5 06/04/2013   HCT 36.7 06/04/2013   MCV 95.6 06/04/2013   PLT 238 06/04/2013      Chemistry      Component Value Date/Time   NA 139 05/18/2013 0430   NA 140 04/19/2013 1300   K 3.8 05/18/2013 0430   K 3.8 04/19/2013 1300   CL 105 05/18/2013 0430   CL 103 03/22/2013 0754   CO2 27 05/18/2013 0430   CO2 29 04/19/2013 1300   BUN 4* 05/18/2013 0430   BUN 11.5 04/19/2013 1300   CREATININE 0.59 05/18/2013 0430   CREATININE 0.8 04/19/2013 1300      Component Value Date/Time   CALCIUM 8.9 05/18/2013 0430   CALCIUM 9.8 04/19/2013 1300   ALKPHOS 57 05/14/2013 2113   ALKPHOS 86 04/19/2013 1300   AST 14 05/14/2013 2113   AST 17 04/19/2013 1300   ALT 10 05/14/2013 2113   ALT 14 04/19/2013 1300   BILITOT 0.2* 05/14/2013 2113   BILITOT 0.27 04/19/2013 1300       Lab Results  Component Value Date   LABCA2 57* 08/09/2012    STUDIES: Dg Abd 1 View  05/16/2013   *RADIOLOGY REPORT*  Clinical Data: colitis and diarrhea for two weeks  ABDOMEN - 1 VIEW  Comparison: ct scan 05/14/13  Findings: Diffuse blastic skeletal metastasis again noted. Recently administered oral contrast seen throughout colon, which is not dilated.  No abnormal bowel dilitation.  IMPRESSION: Nonobstructive bowel gas pattern.   Original Report Authenticated By: Esperanza Heir, [name].   Ct Abdomen Pelvis W Contrast  05/14/2013   *RADIOLOGY REPORT*  Clinical Data: Diarrhea, abdominal pain.  Abdominal cramping.  CT ABDOMEN AND PELVIS WITH CONTRAST  Technique:  Multidetector CT imaging of the abdomen and pelvis was performed following the standard protocol during bolus administration of intravenous contrast.  Contrast: OMNIPAQUE IOHEXOL 300 MG/ML  SOLN, 50mL OMNIPAQUE IOHEXOL 300 MG/ML   SOLN  Comparison: CT 08/24/2012.  MRI 04/01/2013.  Findings: Lung Bases: Lung bases appears similar to prior with nodular scarring in the right middle lobe and scattered areas of dependent atelectasis.  Left mastectomy is incidentally noted.  Liver:  Normal.  Spleen:  Normal.  Gallbladder:  Contracted.  No calcified stones or inflammatory changes.  Common bile duct:  Normal.  Pancreas:  Normal.  Adrenal glands:  Normal bilaterally.  Kidneys:  Normal enhancement and delayed excretion of contrast. Punctate nonobstructing right renal collecting system calculi are present.  Left ureter can be followed to the urinary bladder appears normal.  Right ureter is also normal.  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  Stomach:  Normal.  Small bowel:  Normal.  No inflammatory changes or dilation.  The duodenum is within normal limits.  There is no mesenteric adenopathy.  Colon:   Normal appendix.  Nonspecific fluid levels are present within the colon, most commonly associated with enteric infection. This correlates with the clinical history of diarrhea.  Thickening of the distal sigmoid extending up to the rectum is present, compatible with colitis.  Diverticulosis without diverticulitis.  Pelvic Genitourinary:  Hysterectomy.  Urinary bladder is partially decompressed and appears within normal limits.  Bones:  Diffuse sclerotic osteoblastic  metastases are present.  No pathologic fracture is identified.  Vasculature: Mild atherosclerosis is present.  Portal vein appears patent. Visceral vessels appear patent.  The IMA is patent.  Body Wall: Diffuse infiltration of the gluteal subcutaneous fat compatible with prior injections.  IMPRESSION:  1.  Findings compatible with colitis.  Sigmoid mural thickening with diffuse colonic air fluid levels. Be due to ischemic colitis, infection with inflammatory bowel disease considered unlikely in the absence of significant clinical history. 2.  Nonobstructing  punctate right renal collecting system calculi with congenital right UPJ obstruction. 3.  Left mastectomy with sclerotic bone metastases.   Original Report Authenticated By: Andreas Newport, [name].     ASSESSMENT:76 y.o.  Mardene Sayer woman    (1) status post left modified radical mastectomy in January 2007 for a T1c N1, stage IIA  invasive lobular carcinoma, grade 1, strongly estrogen and progesterone receptor positive, HER-2 negative, with an MIB-1 of 7%.  (2)  Status post radiation given concurrently with Capecitabine.  Declined chemotherapy.    (3) Status post anastrozole and exemestane, both discontinued due to aches and pains.  Status post tamoxifen between October 2007 until August 2011.  Discontinued secondary to cramps   (4) multiple sclerotic bony lesions noted at initial diagnosis, biopsied x2 August 2009 without malignancy documented, on zoledronic acid annually starting 12/12/2007.  Most recent dose of zoledronic acid given on 01/03/2013.  (5) On fulvestrant between September 2011 and 05/17/2013 with likely progression of bony disease  (6)  radiation to the lumbosacral spine and right hip to treat right hip pain to 30 gray completed 05/11/2013, complicated by [possible] radiation induced colitis    PLAN:  [name] is recovering well from her radiation, and within a few weeks should no longer feel fatigued or have any GI problems. She is going to see me again at around that time and just before that visit we will obtain a bone scan which will serve as our new baseline.  She still has no measurable disease that I can find. We are going to switch her treatment to letrozole, which she has not had before, but we will not start that until her next visit here. We will also resume her monthly zoledronic acid at that time  She does not have much emotional or spiritual support. I am not sure how to facilitate that for her. I did ask her to make sure her husband sits in with [place] next visit so that  he can hear about what her situation and the overall plan is. That should help communication. Otherwise she knows to call for any problems that may develop before that visit.   MAGRINAT,GUSTAV C    06/04/2013

## 2013-06-05 ENCOUNTER — Other Ambulatory Visit: Payer: Self-pay | Admitting: Physician Assistant

## 2013-06-05 ENCOUNTER — Telehealth: Payer: Self-pay | Admitting: *Deleted

## 2013-06-05 NOTE — Telephone Encounter (Signed)
Called patient ok to cancel per MD she said to thank Dr. Tyler Aas that she doesn't need top come in for a follow up  Will cancer hher appt 10:16 AM

## 2013-06-06 ENCOUNTER — Telehealth: Payer: Self-pay | Admitting: Oncology

## 2013-06-06 NOTE — Telephone Encounter (Signed)
Per pof's sent 8/18 and 8/19 cx'd appts on 8/28 and 9/25. Added lb 9/23 and inf to 9/30 f/u appt. S/w pt re changes and she is aware of new appt d/t's. Schedule mailed.

## 2013-06-07 ENCOUNTER — Ambulatory Visit: Admission: RE | Admit: 2013-06-07 | Payer: Medicare Other | Source: Ambulatory Visit | Admitting: Radiation Oncology

## 2013-06-07 ENCOUNTER — Other Ambulatory Visit: Payer: Self-pay | Admitting: Emergency Medicine

## 2013-06-07 HISTORY — DX: Personal history of irradiation: Z92.3

## 2013-06-14 ENCOUNTER — Ambulatory Visit: Payer: Self-pay

## 2013-06-14 ENCOUNTER — Other Ambulatory Visit: Payer: Medicare Other | Admitting: Lab

## 2013-06-14 ENCOUNTER — Ambulatory Visit: Payer: Medicare Other | Admitting: Physician Assistant

## 2013-06-14 ENCOUNTER — Ambulatory Visit: Payer: Medicare Other

## 2013-06-14 ENCOUNTER — Other Ambulatory Visit: Payer: Self-pay | Admitting: Lab

## 2013-06-19 ENCOUNTER — Other Ambulatory Visit: Payer: Self-pay | Admitting: *Deleted

## 2013-06-19 MED ORDER — GLIMEPIRIDE 1 MG PO TABS: ORAL_TABLET | ORAL | Status: DC

## 2013-06-22 ENCOUNTER — Other Ambulatory Visit: Payer: Self-pay | Admitting: *Deleted

## 2013-06-22 MED ORDER — GLIMEPIRIDE 1 MG PO TABS: ORAL_TABLET | ORAL | Status: DC

## 2013-06-25 ENCOUNTER — Telehealth: Payer: Self-pay | Admitting: Endocrinology

## 2013-06-25 ENCOUNTER — Ambulatory Visit
Admission: RE | Admit: 2013-06-25 | Discharge: 2013-06-25 | Disposition: A | Discharge status: Home / Self Care | Payer: Medicare Other | Source: Ambulatory Visit

## 2013-06-25 ENCOUNTER — Telehealth: Payer: Self-pay | Admitting: *Deleted

## 2013-06-25 DIAGNOSIS — Z853 Personal history of malignant neoplasm of breast: Secondary | ICD-10-CM

## 2013-06-25 DIAGNOSIS — Z1231 Encounter for screening mammogram for malignant neoplasm of breast: Secondary | ICD-10-CM

## 2013-06-26 ENCOUNTER — Telehealth: Payer: Self-pay | Admitting: *Deleted

## 2013-06-26 NOTE — Telephone Encounter (Signed)
Left message on voicemail to return call.

## 2013-06-26 NOTE — Telephone Encounter (Signed)
Medication management

## 2013-06-27 ENCOUNTER — Telehealth: Payer: Self-pay | Admitting: Endocrinology

## 2013-06-27 NOTE — Telephone Encounter (Signed)
Returning call to [facility], sugar is staying high. Says she can not get anyone on the phone or to call her back. Please call today ASAP. Pt is frustrated, doesn't feel like we are returning her calls / Sherri S.

## 2013-06-28 ENCOUNTER — Telehealth: Payer: Self-pay | Admitting: Endocrinology

## 2013-06-28 NOTE — Telephone Encounter (Signed)
Returned patient's call. Patient stated that she was in the hospital recently. The physician at the hospital took patient off metformin. Patient gave to readings. 147 after she had eaten lunch on 06/27/13 and 198 this morning before breakfast. Patient can not come into office for appointment today or tomorrow. Requests a medication be sent into pharmacy.

## 2013-06-28 NOTE — Telephone Encounter (Signed)
Called patient back to give her advise. Patient stated that she is going to stop coming to this office and start going to her primary care doctor. Patient ended call before I could give her advise.

## 2013-06-28 NOTE — Telephone Encounter (Signed)
Blood sugar will come down with restarting metformin; take this with food, not to be concerned about blood sugar if it is high for a couple of days

## 2013-06-28 NOTE — Telephone Encounter (Signed)
Noted  

## 2013-06-28 NOTE — Telephone Encounter (Signed)
Need to know exactly what her blood sugars are and at what times of the day, will be glad to see her in the office as a add on today or tomorrow

## 2013-07-04 ENCOUNTER — Ambulatory Visit: Payer: Medicare Other | Admitting: Physician Assistant

## 2013-07-10 ENCOUNTER — Encounter (HOSPITAL_COMMUNITY)
Admission: RE | Admit: 2013-07-10 | Discharge: 2013-07-10 | Disposition: A | Discharge status: Home / Self Care | Payer: Medicare Other | Source: Ambulatory Visit | Attending: Oncology | Admitting: Oncology

## 2013-07-10 ENCOUNTER — Other Ambulatory Visit (HOSPITAL_BASED_OUTPATIENT_CLINIC_OR_DEPARTMENT_OTHER): Payer: Medicare Other | Admitting: Lab

## 2013-07-10 ENCOUNTER — Encounter (HOSPITAL_COMMUNITY): Payer: Self-pay

## 2013-07-10 DIAGNOSIS — Z853 Personal history of malignant neoplasm of breast: Secondary | ICD-10-CM

## 2013-07-10 DIAGNOSIS — C7951 Secondary malignant neoplasm of bone: Secondary | ICD-10-CM | POA: Insufficient documentation

## 2013-07-10 LAB — COMPREHENSIVE METABOLIC PANEL (CC13)
AST: 18 U/L (ref 5–34)
Alkaline Phosphatase: 80 U/L (ref 40–150)
CO2: 26 mEq/L (ref 22–29)
Chloride: 106 mEq/L (ref 98–109)
Creatinine: 0.7 mg/dL (ref 0.6–1.1)
Glucose: 117 mg/dl (ref 70–140)
Potassium: 4.1 mEq/L (ref 3.5–5.1)
Sodium: 141 mEq/L (ref 136–145)
Total Bilirubin: 0.29 mg/dL (ref 0.20–1.20)
Total Protein: 6.9 g/dL (ref 6.4–8.3)

## 2013-07-10 LAB — CBC WITH DIFFERENTIAL/PLATELET
BASO%: 0.6 % (ref 0.0–2.0)
Eosinophils Absolute: 0.1 10*3/uL (ref 0.0–0.5)
HCT: 36.5 % (ref 34.8–46.6)
HGB: 12.6 g/dL (ref 11.6–15.9)
LYMPH%: 24.7 % (ref 14.0–49.7)
MCHC: 34.6 g/dL (ref 31.5–36.0)
MCV: 95.8 fL (ref 79.5–101.0)
MONO#: 0.5 10*3/uL (ref 0.1–0.9)
MONO%: 10.1 % (ref 0.0–14.0)
NEUT%: 62.5 % (ref 38.4–76.8)
Platelets: 226 10*3/uL (ref 145–400)
RBC: 3.81 10*6/uL (ref 3.70–5.45)
RDW: 13.3 % (ref 11.2–14.5)
WBC: 4.6 10*3/uL (ref 3.9–10.3)

## 2013-07-10 MED ORDER — TECHNETIUM TC 99M MEDRONATE IV KIT
25.0000 | PACK | Freq: Once | INTRAVENOUS | Status: AC | PRN
  Administered 2013-07-10: 25 via INTRAVENOUS

## 2013-07-12 ENCOUNTER — Ambulatory Visit: Payer: Medicare Other | Admitting: Physician Assistant

## 2013-07-12 ENCOUNTER — Other Ambulatory Visit: Payer: Medicare Other | Admitting: Lab

## 2013-07-12 ENCOUNTER — Ambulatory Visit: Payer: Medicare Other

## 2013-07-17 ENCOUNTER — Ambulatory Visit (HOSPITAL_BASED_OUTPATIENT_CLINIC_OR_DEPARTMENT_OTHER): Payer: Medicare Other

## 2013-07-17 ENCOUNTER — Ambulatory Visit (HOSPITAL_BASED_OUTPATIENT_CLINIC_OR_DEPARTMENT_OTHER): Payer: Medicare Other | Admitting: Oncology

## 2013-07-17 VITALS — BP 151/69 | HR 73 | Temp 98.4°F | Resp 18 | Ht 64.0 in | Wt 149.1 lb

## 2013-07-17 DIAGNOSIS — C7951 Secondary malignant neoplasm of bone: Secondary | ICD-10-CM

## 2013-07-17 DIAGNOSIS — C50419 Malignant neoplasm of upper-outer quadrant of unspecified female breast: Secondary | ICD-10-CM

## 2013-07-17 DIAGNOSIS — C50919 Malignant neoplasm of unspecified site of unspecified female breast: Secondary | ICD-10-CM

## 2013-07-17 DIAGNOSIS — Z853 Personal history of malignant neoplasm of breast: Secondary | ICD-10-CM

## 2013-07-17 MED ORDER — SODIUM CHLORIDE 0.9 % IV SOLN
INTRAVENOUS | Status: DC
  Administered 2013-07-17: 15:00:00 via INTRAVENOUS

## 2013-07-17 MED ORDER — LETROZOLE 2.5 MG PO TABS: 2.5000 mg | ORAL_TABLET | Freq: Every day | ORAL | Status: DC

## 2013-07-17 MED ORDER — ZOLEDRONIC ACID 4 MG/100ML IV SOLN
4.0000 mg | Freq: Once | INTRAVENOUS | Status: AC
  Administered 2013-07-17: 4 mg via INTRAVENOUS
  Filled 2013-07-17: qty 100

## 2013-07-17 NOTE — Addendum Note (Signed)
Addended by: Billey Co on: 07/17/2013 05:50 PM   Modules accepted: Orders

## 2013-07-17 NOTE — Addendum Note (Signed)
Addended by: Lowella Dell on: 07/17/2013 02:21 PM   Modules accepted: Orders

## 2013-07-17 NOTE — Patient Instructions (Addendum)
Zoledronic Acid injection (Hypercalcemia, Oncology) What is this medicine? ZOLEDRONIC ACID (ZOE le dron ik AS id) lowers the amount of calcium loss from bone. It is used to treat too much calcium in your blood from cancer. It is also used to prevent complications of cancer that has spread to the bone. This medicine may be used for other purposes; ask your health care provider or pharmacist if you have questions. What should I tell my health care provider before I take this medicine? They need to know if you have any of these conditions: -aspirin-sensitive asthma -dental disease -kidney disease -an unusual or allergic reaction to zoledronic acid, other medicines, foods, dyes, or preservatives -pregnant or trying to get pregnant -breast-feeding How should I use this medicine? This medicine is for infusion into a vein. It is given by a health care professional in a hospital or clinic setting. Talk to your pediatrician regarding the use of this medicine in children. Special care may be needed. Overdosage: If you think you have taken too much of this medicine contact a poison control center or emergency room at once. NOTE: This medicine is only for you. Do not share this medicine with others. What if I miss a dose? It is important not to miss your dose. Call your doctor or health care professional if you are unable to keep an appointment. What may interact with this medicine? -certain antibiotics given by injection -NSAIDs, medicines for pain and inflammation, like ibuprofen or naproxen -some diuretics like bumetanide, furosemide -teriparatide -thalidomide This list may not describe all possible interactions. Give your health care provider a list of all the medicines, herbs, non-prescription drugs, or dietary supplements you use. Also tell them if you smoke, drink alcohol, or use illegal drugs. Some items may interact with your medicine. What should I watch for while using this medicine? Visit  your doctor or health care professional for regular checkups. It may be some time before you see the benefit from this medicine. Do not stop taking your medicine unless your doctor tells you to. Your doctor may order blood tests or other tests to see how you are doing. Women should inform their doctor if they wish to become pregnant or think they might be pregnant. There is a potential for serious side effects to an unborn child. Talk to your health care professional or pharmacist for more information. You should make sure that you get enough calcium and vitamin D while you are taking this medicine. Discuss the foods you eat and the vitamins you take with your health care professional. Some people who take this medicine have severe bone, joint, and/or muscle pain. This medicine may also increase your risk for a broken thigh bone. Tell your doctor right away if you have pain in your upper leg or groin. Tell your doctor if you have any pain that does not go away or that gets worse. What side effects may I notice from receiving this medicine? Side effects that you should report to your doctor or health care professional as soon as possible: -allergic reactions like skin rash, itching or hives, swelling of the face, lips, or tongue -anxiety, confusion, or depression -breathing problems -changes in vision -feeling faint or lightheaded, falls -jaw burning, cramping, pain -muscle cramps, stiffness, or weakness -trouble passing urine or change in the amount of urine Side effects that usually do not require medical attention (report to your doctor or health care professional if they continue or are bothersome): -bone, joint, or muscle pain -  fever -hair loss -irritation at site where injected -loss of appetite -nausea, vomiting -stomach upset -tired This list may not describe all possible side effects. Call your doctor for medical advice about side effects. You may report side effects to FDA at  1-800-FDA-1088. Where should I keep my medicine? This drug is given in a hospital or clinic and will not be stored at home. NOTE: This sheet is a summary. It may not cover all possible information. If you have questions about this medicine, talk to your doctor, pharmacist, or health care provider.  2013, Elsevier/Gold Standard. (04/02/2011 9:06:58 AM)  

## 2013-07-17 NOTE — Progress Notes (Signed)
ID: Jamie [name]   DOB: 02-27-1936  MR#: [id number]  FAO#:[id number]  PCP: Gwen Pounds, MD GYN: Richardean Chimera SU: OTHER MD: Jeananne Rama, Lina Sar  HISTORY OF PRESENT ILLNESS: The patient's cancer was uncovered by an unusually circuitous route. She presented with stomach upset.  Because of a history of colitis, Dr. Dorena Cookey who happened to be on call for Dr. Matthias Hughs, obtained CT of the abdomen and pelvis on August 30, 2005.  As far as the abdomen and pelvis were concerned, there was no evidence of colitis but there were multiple scattered sclerotic lesions in the bone suggesting possible sclerotic metastatic disease (versus osteopoikilosis.)  Accordingly a bone scan was obtained.  This was done on September 01, 2005, and found tiny sclerotic lesions, and in particular a lesion of concern in the sternum.  Accordingly a CT scan of the chest was obtained.  This found the sternal problem to correspond with degenerative changes. However, it also showed a soft tissue nodule in the left breast.  The patient then had mammograms on September 27, 2005.  This found two areas of spiculation and architectural distortion in the upper outer quadrant of the left breast.  By ultrasound these were hyperechoic and irregular, highly suspicious for carcinoma.  Biopsy was obtained the same day and showed ([id number], as well as [id number]) invasive lobular carcinoma, with both lesions biopsied being ER and PR positive, both herceptest positive, both negative by FISH.  Accordingly the patient was referred to Dr. Corliss Skains and breast MRI was obtained October 06, 2006.  No abnormal enhancement was noted in the right breast.  There was enhancing nodularity in the upper portion of the left breast where several discrete nodules were seen.  Accordingly Dr. Corliss Skains performed a left modified radical mastectomy on November 02, 2005.  The patient had a positive sentinel lymph node and Dr. Corliss Skains performed a left axillary lymph  node dissection.  However, only three additional lymph nodes were recovered, two of which also showed metastatic involvement (all had extra capsular extension.)  The patient's subsequent history is as detailed below  INTERVAL HISTORY: Jamie [name] returns today for followup of her metastatic breast carcinoma accompanied by her husband Jamie [name]. She has recovered sufficiently from her radiation treatments at this point that she is ready to start her new treatments with zolendronate and letrozole  REVIEW OF SYSTEMS: She started feeling much better last week, with increased energy, which led her work around the house a little bit more. The diarrhea she had from the radiation is pretty much resolved, although occasionally depending on what she eats, she can get cramps, and then she noticed to have 2 or 3 bowel movements that day. She still has pain in her right hip and she doesn't think the radiation really helped all that much just sitting there she doesn't hurt but when she stands or sometimes for no reason at all she can have a sharp jabbing pain in that area. She sleeps poorly usually gets up at least once a night with nocturia. She has some seasonal sinus problems. She has stress urinary incontinence, which is not new. She feels forgetful, anxious, and depressed. She tells me she just saw her dentist yesterday and he told her her dentition was "good".  PAST MEDICAL HISTORY: Past Medical History  Diagnosis Date  . Anxiety   . Asthma   . Breast cancer     metastasized  . Depression   . Diabetes mellitus   . GERD (gastroesophageal reflux  disease)   . Hyperlipidemia   . Internal hemorrhoids   . Hx of adenomatous colonic polyps   . IBS (irritable bowel syndrome)   . Mitral valve prolapse   . Ischemic colitis   . Fibromyalgia   . Allergy   . Cataract   . S/P radiation therapy within four to twelve weeks 04/26/13-05/11/13    L-spine/sacrum 30Gy/29fx  Significant for diabetes, hypercholesterolemia, asthma,  mitral valve prolapse requiring antibiotic prophylaxis before any invasive procedures, Meniere's disease, gastroesophageal reflux disease, osteoarthritis, degenerative disk disease, history of fibromyalgia, panic attacks, history of synovial cyst removal, history of total hysterectomy with bilateral salpingo-oophorectomy, history of septoplasty x2, status post appendectomy, status post dilatation and curettage remotely, status post bilateral carpal tunnel release under Dr. Rosanne Ashing [place].  PAST SURGICAL HISTORY: Past Surgical History  Procedure Laterality Date  . Mastectomy      left  and then treated with chemo and radiation  . Total abdominal hysterectomy    . Back surgery      synovial cyst removed from spine  . Carpal tunnel release      bilateral  . Dilation and curettage of uterus    . Appendectomy    . Cataract surgery Bilateral     FAMILY HISTORY The patient's father died at the age of 43 from a stroke.  The patient's mother died at the age of 57, also from a stroke.  The patient has three brothers; one died with "liver disease."  One has prostate cancer.  The other brother has prostate and colon cancer.  The only breast cancer in the family was the patient's mother's mother and she does not know how old her grandmother was when she was diagnosed.  There is no history of ovarian cancer in the family.  GYNECOLOGIC HISTORY: She is GXP2.  She had her hysterectomy while still menstruating and she took hormone replacement until December 2006, when she had her breast biopsy.  SOCIAL HISTORY: She used to work as a Lawyer.  Her husband Jamie [name] (who is the son of my former patient Terryn Rosenkranz) is semiretired, working for a Financial risk analyst.  What he likes to do is hunt and fish.  Their son Jamie [name] had significant muscular dystrophy and died in 03-22-2007. Their daughter Jamie [name] lives in [place] and works in an office.  The patient has two granddaughters, both RNs, one working at [employer] and the other  in [place].    ADVANCED DIRECTIVES:  HEALTH MAINTENANCE: History  Substance Use Topics  . Smoking status: Never Smoker   . Smokeless tobacco: Never Used  . Alcohol Use: No     Colonoscopy:  PAP:  Bone density:  Lipid panel:  Allergies  Allergen Reactions  . Niacin Other (See Comments)    Dizziness   . Promethazine Hcl Other (See Comments)    Dizziness "FELT CRAZY"     Current Outpatient Prescriptions  Medication Sig Dispense Refill  . Alum Hydroxide-Mag Carbonate (GAVISCON PO) Take 1 capsule by mouth daily.      . beclomethasone (QVAR) 40 MCG/ACT inhaler Inhale 2 puffs into the lungs at bedtime.      Marland Kitchen BYSTOLIC 5 MG tablet Take 1 tablet by mouth Daily.      . Cholecalciferol (D3 ADULT) 1000 UNITS CHEW Chew 1,000 Units by mouth daily.      . cholestyramine (QUESTRAN) 4 G packet Take 1 packet by mouth 1(one) to 2 (two) times daily with a meal to help with diarrhea.  Hold if constipated.  60 each  3  . clonazePAM (KLONOPIN) 0.5 MG tablet Take 0.5 mg by mouth 2 (two) times daily.        . CRESTOR 5 MG tablet Take 1 tablet by mouth Daily.      Marland Kitchen dicyclomine (BENTYL) 10 MG capsule Take 2 capsules (20 mg total) by mouth 3 (three) times daily before meals as needed for Abdominal cramping  30 capsule  0  . fulvestrant (FASLODEX) 250 MG/5ML injection Inject 250 mg into the muscle every 30 (thirty) days. One injection each buttock over 1-2 minutes. Warm prior to use.      . gabapentin (NEURONTIN) 100 MG capsule Take 100 mg by mouth.      Marland Kitchen glimepiride (AMARYL) 1 MG tablet Take 0.5 tablets (0.5 mg total) by mouth if am blood sugar is > 125 and eating well.  Watch for lows  90 tablet  1  . hydrocortisone (ANUSOL-HC) 2.5 % rectal cream Place rectally 4 (four) times daily.  30 g  0  . letrozole (FEMARA) 2.5 MG tablet Take 1 tablet (2.5 mg total) by mouth daily.  90 tablet  4  . lidocaine (XYLOCAINE) 5 % ointment Apply topically 4 (four) times daily as needed.  35.44 g  0  .  lidocaine-hydrocortisone (ANAMANTEL HC) 3-0.5 % CREA Place 1 Applicatorful rectally 2 (two) times daily as needed for hemorrhoids and bleeding.  28.35 g  0  . loperamide (IMODIUM) 2 MG capsule Take 1 capsule (2 mg total) by mouth daily BID prn Diarrhea.  30 capsule  0  . meclizine (ANTIVERT) 25 MG tablet Take 1 tablet by mouth as needed. dizziness      . metFORMIN (GLUCOPHAGE-XR) 500 MG 24 hr tablet Take 1 tablet (500 mg total) by mouth daily - Watch for Diarrhea - If having diarrhea - OK to hold this medicine.      . metroNIDAZOLE (FLAGYL) 500 MG tablet Take 1 tablet (500 mg total) by mouth every 8 (eight) hours to be done after last dose on August 3rd      . NASONEX 50 MCG/ACT nasal spray 1 spray by Nasal route at bedtime.      Marland Kitchen NEXIUM 40 MG capsule Take 1 tablet by mouth Daily.      . ondansetron (ZOFRAN ODT) 4 MG disintegrating tablet Take 1 tablet (4 mg total) by mouth every 8 (eight) hours as needed for nausea.  10 tablet  0  . ONGLYZA 5 MG TABS tablet Take 5 mg by mouth daily.       Marland Kitchen oxyCODONE-acetaminophen (PERCOCET/ROXICET) 5-325 MG per tablet Take 1 tablet by mouth every 4 (four) hours as needed for pain.  50 tablet  0  . PROAIR HFA 108 (90 BASE) MCG/ACT inhaler       . saccharomyces boulardii (FLORASTOR) 250 MG capsule Take 1 capsule (250 mg total) by mouth 2 (two) times daily.  20 capsule  0   No current facility-administered medications for this visit.    OBJECTIVE: Middle-aged white woman who was tearful during today's interview Filed Vitals:   07/17/13 1405  BP: 151/69  Pulse: 73  Temp: 98.4 F (36.9 C)  Resp: 18     Body mass index is 25.58 kg/(m^2).    ECOG FS: 1 Filed Weights   07/17/13 1405  Weight: 149 lb 1.6 oz (67.631 kg)   Sclerae unicteric, pupils equal round and reactive to light Oropharynx clear No cervical or supraclavicular adenopathy Lungs clear to auscultation, good excursion bilaterally Heart  regular rate and rhythm Abd soft, nontender, positive  bowel sounds, no masses palpated MSK no focal spinal tenderness, including the lower spine; no peripheral edema Neuro: nonfocal, well oriented, depressed affect Breasts: The right breast shows no suspicious findings. The left breast is status post mastectomy and radiation. There is some telangiectasia secondary to radiation. There is no evidence of local recurrence. The left axilla is benign.  LAB RESULTS: Lab Results  Component Value Date   WBC 4.6 07/10/2013   NEUTROABS 2.9 07/10/2013   HGB 12.6 07/10/2013   HCT 36.5 07/10/2013   MCV 95.8 07/10/2013   PLT 226 07/10/2013      Chemistry      Component Value Date/Time   NA 141 07/10/2013 0959   NA 139 05/18/2013 0430   K 4.1 07/10/2013 0959   K 3.8 05/18/2013 0430   CL 105 05/18/2013 0430   CL 103 03/22/2013 0754   CO2 26 07/10/2013 0959   CO2 27 05/18/2013 0430   BUN 10.9 07/10/2013 0959   BUN 4* 05/18/2013 0430   CREATININE 0.7 07/10/2013 0959   CREATININE 0.59 05/18/2013 0430      Component Value Date/Time   CALCIUM 9.3 07/10/2013 0959   CALCIUM 8.9 05/18/2013 0430   ALKPHOS 80 07/10/2013 0959   ALKPHOS 57 05/14/2013 2113   AST 18 07/10/2013 0959   AST 14 05/14/2013 2113   ALT 13 07/10/2013 0959   ALT 10 05/14/2013 2113   BILITOT 0.29 07/10/2013 0959   BILITOT 0.2* 05/14/2013 2113       Lab Results  Component Value Date   LABCA2 57* 08/09/2012    STUDIES: Nm Bone Scan Whole Body  07/10/2013   CLINICAL DATA:  History of breast cancer  EXAM: NUCLEAR MEDICINE WHOLE BODY BONE SCAN  TECHNIQUE: Whole body anterior and posterior images were obtained approximately 3 hours after intravenous injection of radiopharmaceutical.  COMPARISON:  04/01/2011  RADIOPHARMACEUTICALS:  25.0 mCi Technetium99 MDP  FINDINGS: Again noted are multi focal abnormal areas of increased uptake within the axial and proximal appendicular skeleton compatible with bone metastasis. As seen on the previous exam there is heterogeneous radiotracer uptake throughout the thoracic and  lumbar spine indicating multiple spinal lesions. At the approximate L2 level there is a medium size focus of intense radiotracer uptake which is new from the previous exam. A small focus of increased uptake within the proximal right humerus has increased from the previous exam. Within the superior aspect of the new left acetabulum there is an increasing focus of radiotracer uptake. Physiologic tracer uptake is noted within the kidneys in urinary bladder.  IMPRESSION: 1. Interval progression of multi focal bone metastases.   Electronically Signed   By: Signa Kell [name].   On: 07/10/2013 17:47   Mm Digital Screening Unilat R  06/25/2013   *RADIOLOGY REPORT*  Clinical Data: Screening.  DIGITAL SCREENING UNILATERAL RIGHT MAMMOGRAM WITH CAD  DIGITAL BREAST TOMOSYNTHESIS  Digital breast tomosynthesis images are acquired in two projections.  These images are reviewed in combination with the digital mammogram, confirming the findings below.  Comparison:  Previous exam(s).  FINDINGS:  ACR Breast Density Category c:  The breast tissue is heterogeneously dense, which may obscure small masses.  There are no findings suspicious for malignancy.  Images were processed with CAD.  IMPRESSION: No mammographic evidence of malignancy.  A result letter of this screening mammogram will be mailed directly to the patient.  RECOMMENDATION: Screening mammogram in one year. (Code:SM-B-01Y)  BI-RADS CATEGORY 2:  Benign finding(s).   Original Report Authenticated By: Sherian Rein, [name].    ASSESSMENT:76 y.o.  Mardene Sayer woman    (1) status post left modified radical mastectomy in January 2007 for a T1c N1, stage IIA  invasive lobular carcinoma, grade 1, strongly estrogen and progesterone receptor positive, HER-2 negative, with an MIB-1 of 7%.  (2)  Status post radiation given concurrently with Capecitabine.  Declined chemotherapy.    (3) Status post anastrozole and exemestane, both discontinued due to aches and pains.  Status post  tamoxifen between October 2007 until August 2011, discontinued secondary to cramps   (4) multiple sclerotic bony lesions noted at initial diagnosis, biopsied x2 August 2009 without malignancy documented, on zoledronic acid annually starting 12/12/2007. Increased to monthly starting 07/17/2013  (5) On fulvestrant between September 2011 and 05/17/2013 with likely progression of bony disease  (6)  radiation to the lumbosacral spine and right hip to treat right hip pain to 30 gray completed 05/11/2013, complicated by [possible] radiation induced colitis    PLAN:  Jamie [name] has a good understanding of the possible toxicities, side effects and complications of letrozole, since she took 2 other aromatase inhibitors in the past. She did not tolerate them or tamoxifen. She understands that we do have many other options, but they actually have more likely side effects the letrozole. For this reason I am hopeful she will be able to take this medication on a long-term basis with good tolerance. As far as that is alendronate is concerned, she has had in the past and has tolerated it well. She just had 13th looked at and she had her dentist both know about the possibility of osteonecrosis of the jaw, which is however unlikely in her situation given her relatively good dentition. There are no plans for any teeth extractions at this time  Accordingly we are starting letrozole daily and zolendronate monthly. We will do this for 6 months and then likely slow down the zolendronate to every 2 months. She is going to see me again the first week in January and a week before that she will have a repeat bone scan to assess response. It is positive that we are not aware of any lung or liver involvement. That does of course complicate the assessment of response.  I think Sandar and Jamie [name] both have a good understanding of the overall situation. They know to call for any problems that may develop before her next visit  here.  Priti Consoli C    07/17/2013

## 2013-07-18 ENCOUNTER — Telehealth: Payer: Self-pay | Admitting: *Deleted

## 2013-07-18 NOTE — Telephone Encounter (Signed)
Per staff message and POF I have scheduled appts.  JMW  

## 2013-07-18 NOTE — Telephone Encounter (Signed)
sw pt gv appt for 08/13/13 and all the other appts. made her aware to arrive@ 10am on 10/27 and tx will follow. made her aware that emailed [facility] for BD orders. i emailed MW to add tx. will mail appts for pt...td

## 2013-07-20 ENCOUNTER — Other Ambulatory Visit: Payer: Self-pay | Admitting: Oncology

## 2013-07-20 DIAGNOSIS — C50919 Malignant neoplasm of unspecified site of unspecified female breast: Secondary | ICD-10-CM

## 2013-08-06 ENCOUNTER — Telehealth: Payer: Self-pay | Admitting: Internal Medicine

## 2013-08-06 ENCOUNTER — Encounter: Payer: Self-pay | Admitting: *Deleted

## 2013-08-06 NOTE — Telephone Encounter (Signed)
Please call pt to start Probiotic 1 po qd and Bentyl 10 mg po bid, #30,

## 2013-08-06 NOTE — Telephone Encounter (Signed)
Spoke with patient and she states she had a GI bug 1 and 1/2 weeks ago. She had nausea and diarrhea with the bug. She is over that now but is having problems with stomach hurting every time she eats.  States the pain is in her stomach not above or below her belly button.States she is taking Nexium BID or daily depending on how her stomach is feeling. She report since the GI bug, her stools are soft, sticky yellow in color. Offered OV today with extender but she wants to wait until she can see Dr. [name] or Mike Gip, PA. Scheduled OV on 08/17/13 at 2:00 PM with Dr. [name].

## 2013-08-07 MED ORDER — DICYCLOMINE HCL 10 MG PO CAPS: ORAL_CAPSULE | ORAL | Status: DC

## 2013-08-07 NOTE — Telephone Encounter (Signed)
Patient given recommendations. Rx sent to pharmacy. 

## 2013-08-09 ENCOUNTER — Telehealth: Payer: Self-pay | Admitting: *Deleted

## 2013-08-09 NOTE — Telephone Encounter (Signed)
Patient wants to know someone for primary care in the local [place] area and would like suggestions from Dr Darnelle Catalan.

## 2013-08-13 ENCOUNTER — Encounter (INDEPENDENT_AMBULATORY_CARE_PROVIDER_SITE_OTHER): Payer: Self-pay

## 2013-08-13 ENCOUNTER — Telehealth: Payer: Self-pay | Admitting: *Deleted

## 2013-08-13 ENCOUNTER — Other Ambulatory Visit (HOSPITAL_BASED_OUTPATIENT_CLINIC_OR_DEPARTMENT_OTHER): Payer: Medicare Other | Admitting: Lab

## 2013-08-13 ENCOUNTER — Ambulatory Visit (HOSPITAL_BASED_OUTPATIENT_CLINIC_OR_DEPARTMENT_OTHER): Payer: Medicare Other

## 2013-08-13 ENCOUNTER — Other Ambulatory Visit: Payer: Medicare Other | Admitting: Lab

## 2013-08-13 VITALS — BP 144/50 | HR 63 | Temp 97.9°F

## 2013-08-13 DIAGNOSIS — Z853 Personal history of malignant neoplasm of breast: Secondary | ICD-10-CM

## 2013-08-13 DIAGNOSIS — C50919 Malignant neoplasm of unspecified site of unspecified female breast: Secondary | ICD-10-CM

## 2013-08-13 DIAGNOSIS — C7951 Secondary malignant neoplasm of bone: Secondary | ICD-10-CM

## 2013-08-13 DIAGNOSIS — J45909 Unspecified asthma, uncomplicated: Secondary | ICD-10-CM

## 2013-08-13 DIAGNOSIS — C50419 Malignant neoplasm of upper-outer quadrant of unspecified female breast: Secondary | ICD-10-CM

## 2013-08-13 DIAGNOSIS — I10 Essential (primary) hypertension: Secondary | ICD-10-CM

## 2013-08-13 DIAGNOSIS — H81319 Aural vertigo, unspecified ear: Secondary | ICD-10-CM

## 2013-08-13 LAB — CBC WITH DIFFERENTIAL/PLATELET
Basophils Absolute: 0 10*3/uL (ref 0.0–0.1)
EOS%: 1.8 % (ref 0.0–7.0)
Eosinophils Absolute: 0.1 10*3/uL (ref 0.0–0.5)
LYMPH%: 26.7 % (ref 14.0–49.7)
MCH: 32.1 pg (ref 25.1–34.0)
MCV: 94.2 fL (ref 79.5–101.0)
MONO%: 12.3 % (ref 0.0–14.0)
NEUT%: 58.7 % (ref 38.4–76.8)
Platelets: 218 10*3/uL (ref 145–400)
RBC: 3.8 10*6/uL (ref 3.70–5.45)
RDW: 12.7 % (ref 11.2–14.5)

## 2013-08-13 LAB — COMPREHENSIVE METABOLIC PANEL (CC13)
AST: 18 U/L (ref 5–34)
Alkaline Phosphatase: 83 U/L (ref 40–150)
Anion Gap: 10 mEq/L (ref 3–11)
CO2: 25 mEq/L (ref 22–29)
Creatinine: 0.7 mg/dL (ref 0.6–1.1)
Glucose: 110 mg/dl (ref 70–140)
Potassium: 3.9 mEq/L (ref 3.5–5.1)
Sodium: 143 mEq/L (ref 136–145)
Total Bilirubin: 0.23 mg/dL (ref 0.20–1.20)
Total Protein: 6.8 g/dL (ref 6.4–8.3)

## 2013-08-13 MED ORDER — ZOLEDRONIC ACID 4 MG/100ML IV SOLN
4.0000 mg | Freq: Once | INTRAVENOUS | Status: AC
  Administered 2013-08-13: 4 mg via INTRAVENOUS
  Filled 2013-08-13: qty 100

## 2013-08-13 NOTE — Telephone Encounter (Signed)
Patient walked over to MD practice side after receiving her Zometa treatment today to report that she is having increased pain in her right hip since she finished her RT to this area. She says it feels like it gets a little worse everyday. She manages her pain with Tylenol during the day and Hydrocodone at bedtime and states that is working for now. She was wondering if Dr Darnelle Catalan needs her to have anything done before she sees him back in January for restaging. Informed patient I would share this information with Dr Darnelle Catalan and call her back with any change in her plan of care. Patient verbalized understanding. Also in reference to call from 08/09/13, we have ordered referral to new primary care at [facility], first available MD.

## 2013-08-13 NOTE — Patient Instructions (Signed)
Zoledronic Acid injection (Hypercalcemia, Oncology) What is this medicine? ZOLEDRONIC ACID (ZOE le dron ik AS id) lowers the amount of calcium loss from bone. It is used to treat too much calcium in your blood from cancer. It is also used to prevent complications of cancer that has spread to the bone. This medicine may be used for other purposes; ask your health care provider or pharmacist if you have questions. What should I tell my health care provider before I take this medicine? They need to know if you have any of these conditions: -aspirin-sensitive asthma -dental disease -kidney disease -an unusual or allergic reaction to zoledronic acid, other medicines, foods, dyes, or preservatives -pregnant or trying to get pregnant -breast-feeding How should I use this medicine? This medicine is for infusion into a vein. It is given by a health care professional in a hospital or clinic setting. Talk to your pediatrician regarding the use of this medicine in children. Special care may be needed. Overdosage: If you think you have taken too much of this medicine contact a poison control center or emergency room at once. NOTE: This medicine is only for you. Do not share this medicine with others. What if I miss a dose? It is important not to miss your dose. Call your doctor or health care professional if you are unable to keep an appointment. What may interact with this medicine? -certain antibiotics given by injection -NSAIDs, medicines for pain and inflammation, like ibuprofen or naproxen -some diuretics like bumetanide, furosemide -teriparatide -thalidomide This list may not describe all possible interactions. Give your health care provider a list of all the medicines, herbs, non-prescription drugs, or dietary supplements you use. Also tell them if you smoke, drink alcohol, or use illegal drugs. Some items may interact with your medicine. What should I watch for while using this medicine? Visit  your doctor or health care professional for regular checkups. It may be some time before you see the benefit from this medicine. Do not stop taking your medicine unless your doctor tells you to. Your doctor may order blood tests or other tests to see how you are doing. Women should inform their doctor if they wish to become pregnant or think they might be pregnant. There is a potential for serious side effects to an unborn child. Talk to your health care professional or pharmacist for more information. You should make sure that you get enough calcium and vitamin D while you are taking this medicine. Discuss the foods you eat and the vitamins you take with your health care professional. Some people who take this medicine have severe bone, joint, and/or muscle pain. This medicine may also increase your risk for a broken thigh bone. Tell your doctor right away if you have pain in your upper leg or groin. Tell your doctor if you have any pain that does not go away or that gets worse. What side effects may I notice from receiving this medicine? Side effects that you should report to your doctor or health care professional as soon as possible: -allergic reactions like skin rash, itching or hives, swelling of the face, lips, or tongue -anxiety, confusion, or depression -breathing problems -changes in vision -feeling faint or lightheaded, falls -jaw burning, cramping, pain -muscle cramps, stiffness, or weakness -trouble passing urine or change in the amount of urine Side effects that usually do not require medical attention (report to your doctor or health care professional if they continue or are bothersome): -bone, joint, or muscle pain -  fever -hair loss -irritation at site where injected -loss of appetite -nausea, vomiting -stomach upset -tired This list may not describe all possible side effects. Call your doctor for medical advice about side effects. You may report side effects to FDA at  1-800-FDA-1088. Where should I keep my medicine? This drug is given in a hospital or clinic and will not be stored at home. NOTE: This sheet is a summary. It may not cover all possible information. If you have questions about this medicine, talk to your doctor, pharmacist, or health care provider.  2013, [employer]vier/[employer]. (04/02/2011 9:06:58 AM)  

## 2013-08-15 ENCOUNTER — Telehealth: Payer: Self-pay | Admitting: Oncology

## 2013-08-15 NOTE — Telephone Encounter (Signed)
Faxed pt medical records to [facility]

## 2013-08-17 ENCOUNTER — Ambulatory Visit: Payer: Medicare Other | Admitting: Internal Medicine

## 2013-08-18 ENCOUNTER — Other Ambulatory Visit: Payer: Self-pay | Admitting: Oncology

## 2013-08-18 DIAGNOSIS — C50919 Malignant neoplasm of unspecified site of unspecified female breast: Secondary | ICD-10-CM

## 2013-08-20 ENCOUNTER — Telehealth: Payer: Self-pay | Admitting: Oncology

## 2013-08-20 NOTE — Telephone Encounter (Signed)
Rad dept to notify the pt of the hip x-ray appt.

## 2013-08-21 ENCOUNTER — Telehealth: Payer: Self-pay | Admitting: *Deleted

## 2013-08-21 ENCOUNTER — Other Ambulatory Visit: Payer: Self-pay | Admitting: *Deleted

## 2013-08-21 DIAGNOSIS — C50919 Malignant neoplasm of unspecified site of unspecified female breast: Secondary | ICD-10-CM

## 2013-08-21 NOTE — Progress Notes (Signed)
This RN spoke with pt per stated concern last week.  Discussed with pt MD recommendation for plain films.   Per further phone discussion, Jamie [name] states pain located in R hip " feels like a sore inside the wants to pop open- and it itches terribly"  Zakhia states she has noted only 1 small red pimple at site- no other skin changes. Pt has no known history of shingles.  Per discussion pt will utilize percocet during the day if needed. She will monitor site for any changes and call if noted or if pain gets worse.

## 2013-08-21 NOTE — Telephone Encounter (Signed)
Per staff message and POF I have scheduled appts.  JMW  

## 2013-08-22 ENCOUNTER — Ambulatory Visit (HOSPITAL_COMMUNITY)
Admission: RE | Admit: 2013-08-22 | Discharge: 2013-08-22 | Disposition: A | Discharge status: Home / Self Care | Payer: Medicare Other | Source: Ambulatory Visit | Attending: Oncology | Admitting: Oncology

## 2013-08-22 DIAGNOSIS — C7951 Secondary malignant neoplasm of bone: Secondary | ICD-10-CM | POA: Insufficient documentation

## 2013-08-22 DIAGNOSIS — C50919 Malignant neoplasm of unspecified site of unspecified female breast: Secondary | ICD-10-CM | POA: Insufficient documentation

## 2013-08-23 ENCOUNTER — Other Ambulatory Visit: Payer: Self-pay | Admitting: Oncology

## 2013-08-28 ENCOUNTER — Telehealth: Payer: Self-pay | Admitting: *Deleted

## 2013-08-28 MED ORDER — OXYCODONE-ACETAMINOPHEN 5-325 MG PO TABS: 1.0000 | ORAL_TABLET | Freq: Three times a day (TID) | ORAL | Status: DC | PRN

## 2013-08-28 NOTE — Telephone Encounter (Signed)
This RN spoke with pt per hip pain and concern if rash appeared for concern of shingles.  Pt states no rash has appeared as well as " for 2 days the hip didn't hurt at all then it started back today " Per conversation Jamie [name] states " but then this morning I found out my neighbor passed and that really has me upset ".  Prescription refill obtained for percocet per pt's request instead oxycodone 5mg .

## 2013-09-10 ENCOUNTER — Other Ambulatory Visit (HOSPITAL_BASED_OUTPATIENT_CLINIC_OR_DEPARTMENT_OTHER): Payer: Medicare Other | Admitting: Lab

## 2013-09-10 ENCOUNTER — Ambulatory Visit (HOSPITAL_BASED_OUTPATIENT_CLINIC_OR_DEPARTMENT_OTHER): Payer: Medicare Other | Admitting: Physician Assistant

## 2013-09-10 ENCOUNTER — Telehealth: Payer: Self-pay | Admitting: *Deleted

## 2013-09-10 ENCOUNTER — Ambulatory Visit: Payer: Medicare Other

## 2013-09-10 ENCOUNTER — Other Ambulatory Visit: Payer: Medicare Other | Admitting: Lab

## 2013-09-10 ENCOUNTER — Other Ambulatory Visit: Payer: Self-pay | Admitting: *Deleted

## 2013-09-10 ENCOUNTER — Telehealth: Payer: Self-pay | Admitting: Oncology

## 2013-09-10 ENCOUNTER — Encounter: Payer: Self-pay | Admitting: Physician Assistant

## 2013-09-10 VITALS — BP 128/75 | HR 76 | Temp 97.4°F | Resp 19 | Ht 64.0 in | Wt 146.7 lb

## 2013-09-10 DIAGNOSIS — R29898 Other symptoms and signs involving the musculoskeletal system: Secondary | ICD-10-CM

## 2013-09-10 DIAGNOSIS — M25559 Pain in unspecified hip: Secondary | ICD-10-CM

## 2013-09-10 DIAGNOSIS — M545 Low back pain: Secondary | ICD-10-CM

## 2013-09-10 DIAGNOSIS — C50919 Malignant neoplasm of unspecified site of unspecified female breast: Secondary | ICD-10-CM

## 2013-09-10 DIAGNOSIS — M25551 Pain in right hip: Secondary | ICD-10-CM

## 2013-09-10 DIAGNOSIS — C50419 Malignant neoplasm of upper-outer quadrant of unspecified female breast: Secondary | ICD-10-CM

## 2013-09-10 DIAGNOSIS — Z853 Personal history of malignant neoplasm of breast: Secondary | ICD-10-CM

## 2013-09-10 DIAGNOSIS — C7951 Secondary malignant neoplasm of bone: Secondary | ICD-10-CM

## 2013-09-10 DIAGNOSIS — F411 Generalized anxiety disorder: Secondary | ICD-10-CM

## 2013-09-10 DIAGNOSIS — R6884 Jaw pain: Secondary | ICD-10-CM

## 2013-09-10 LAB — CBC WITH DIFFERENTIAL/PLATELET
EOS%: 1.3 % (ref 0.0–7.0)
HCT: 39.5 % (ref 34.8–46.6)
MCH: 32.4 pg (ref 25.1–34.0)
MCV: 94.7 fL (ref 79.5–101.0)
MONO%: 7.9 % (ref 0.0–14.0)
NEUT#: 3.4 10*3/uL (ref 1.5–6.5)
RBC: 4.17 10*6/uL (ref 3.70–5.45)
RDW: 13 % (ref 11.2–14.5)
WBC: 5.2 10*3/uL (ref 3.9–10.3)
lymph#: 1.3 10*3/uL (ref 0.9–3.3)

## 2013-09-10 MED ORDER — HYDROCODONE-ACETAMINOPHEN 5-325 MG PO TABS: 1.0000 | ORAL_TABLET | Freq: Four times a day (QID) | ORAL | Status: DC | PRN

## 2013-09-10 MED ORDER — CLONAZEPAM 0.5 MG PO TABS: 0.5000 mg | ORAL_TABLET | Freq: Three times a day (TID) | ORAL | Status: DC | PRN

## 2013-09-10 NOTE — Telephone Encounter (Signed)
sw pt [facility] appt for MR on 09/21/13@ 3:30pm. Per Olegario Messier pt is aware to make them aware that xray of the jaw is needed. Olegario Messier stated they will take care of it...td

## 2013-09-10 NOTE — Telephone Encounter (Signed)
, °

## 2013-09-10 NOTE — Progress Notes (Signed)
ID: Jamie [name]   DOB: 1936-01-18  MR#: [id number]  YTK#:[id number]  PCP: Gwen Pounds, MD GYN: Richardean Chimera SU: OTHER MD: Jeananne Rama, Lina Sar  CHIEF COMPLAINT:  Metastatic Breast Cancer   HISTORY OF PRESENT ILLNESS: The patient's cancer was uncovered by an unusually circuitous route. She presented with stomach upset.  Because of a history of colitis, Dr. Dorena Cookey who happened to be on call for Dr. Matthias Hughs, obtained CT of the abdomen and pelvis on August 30, 2005.  As far as the abdomen and pelvis were concerned, there was no evidence of colitis but there were multiple scattered sclerotic lesions in the bone suggesting possible sclerotic metastatic disease (versus osteopoikilosis.)  Accordingly a bone scan was obtained.  This was done on September 01, 2005, and found tiny sclerotic lesions, and in particular a lesion of concern in the sternum.  Accordingly a CT scan of the chest was obtained.  This found the sternal problem to correspond with degenerative changes. However, it also showed a soft tissue nodule in the left breast.  The patient then had mammograms on September 27, 2005.  This found two areas of spiculation and architectural distortion in the upper outer quadrant of the left breast.  By ultrasound these were hyperechoic and irregular, highly suspicious for carcinoma.  Biopsy was obtained the same day and showed [id number] [phone] [id number], as well as [phone]) invasive lobular carcinoma, with both lesions biopsied being ER and PR positive, both herceptest positive, both negative by FISH.  Accordingly the patient was referred to Dr. Corliss Skains and breast MRI was obtained October 06, 2006.  No abnormal enhancement was noted in the right breast.  There was enhancing nodularity in the upper portion of the left breast where several discrete nodules were seen.  Accordingly Dr. Corliss Skains performed a left modified radical mastectomy on November 02, 2005.  The patient had a positive sentinel lymph node  and Dr. Corliss Skains performed a left axillary lymph node dissection.  However, only three additional lymph nodes were recovered, two of which also showed metastatic involvement (all had extra capsular extension.)  The patient's subsequent history is as detailed below  INTERVAL HISTORY: Jamie [name] returns alone today for followup of her metastatic breast carcinoma.  She is now taking letrozole daily, and receives a zoledronic acid on a monthly basis, due for her next infusion today.  Jamie [name] tells me she has not been feeling very well. She continues to have pain, and in fact feels like the pain has worsened in her lower back and right hip. She tells me the pain was a 10 on a scale of 1-10 when she first woke up this morning, and currently is a 7. The pain radiates down the right leg. She's also having some increased weakness in the left leg and tells me she has fallen approximately 3 times in the last couple of months. She denies any injuries with the falls. She denies any numbness or tingling in the lower extremities.  Jamie [name]'s biggest complaint today, however, it is increased anxiety with apparent panic attacks. These tend to occur during the night or first thing in the morning. She has been taking Klonopin, 0.5 mg twice daily, for some time now, and in the past has found this helpful. She was recently started on oxycodone for pain, and feels like the anxiety attack started soon thereafter. She continues to have some depression, but denies any suicidal ideation.  Jamie [name] tells me she is also having increased jaw pain, more so on  the left than the right. This seems to be affecting the lower jaw as well as the temporomandibular joints. She denies any dental issues and has had no recent dental procedures or extractions. Her last dental checkup was approximately 2 months ago and was "okay".  REVIEW OF SYSTEMS: Jamie [name] has had no fevers, chills, increased hot flashes or night sweats. She denies any problems with  nausea or emesis and has had no change in bowel or bladder habits. She has some shortness of breath with exertion, but denies any cough, phlegm production, chest pain, or palpitations. She's had no abnormal headaches, dizziness, or change in vision. She's had no peripheral swelling.   A detailed review of systems is otherwise noncontributory.  PAST MEDICAL HISTORY: Past Medical History  Diagnosis Date  . Anxiety   . Asthma   . Breast cancer     metastasized  . Depression   . Diabetes mellitus   . GERD (gastroesophageal reflux disease)   . Hyperlipidemia   . Internal hemorrhoids   . Hx of adenomatous colonic polyps   . IBS (irritable bowel syndrome)   . Mitral valve prolapse   . Ischemic colitis   . Fibromyalgia   . Allergy   . Cataract   . S/P radiation therapy within four to twelve weeks 04/26/13-05/11/13    L-spine/sacrum 30Gy/71fx  Significant for diabetes, hypercholesterolemia, asthma, mitral valve prolapse requiring antibiotic prophylaxis before any invasive procedures, Meniere's disease, gastroesophageal reflux disease, osteoarthritis, degenerative disk disease, history of fibromyalgia, panic attacks, history of synovial cyst removal, history of total hysterectomy with bilateral salpingo-oophorectomy, history of septoplasty x2, status post appendectomy, status post dilatation and curettage remotely, status post bilateral carpal tunnel release under Dr. Rosanne Ashing [place].  PAST SURGICAL HISTORY: Past Surgical History  Procedure Laterality Date  . Mastectomy Left     then treated with chemo and radiation  . Total abdominal hysterectomy    . Back surgery      synovial cyst removed from spine  . Carpal tunnel release Bilateral   . Dilation and curettage of uterus    . Appendectomy    . Cataract surgery Bilateral     FAMILY HISTORY The patient's father died at the age of 11 from a stroke.  The patient's mother died at the age of 30, also from a stroke.  The patient has three  brothers; one died with "liver disease."  One has prostate cancer.  The other brother has prostate and colon cancer.  The only breast cancer in the family was the patient's mother's mother and she does not know how old her grandmother was when she was diagnosed.  There is no history of ovarian cancer in the family.  GYNECOLOGIC HISTORY: She is GXP2.  She had her hysterectomy while still menstruating and she took hormone replacement until December 2006, when she had her breast biopsy.  SOCIAL HISTORY: (Updated November 2014) She used to work as a Lawyer.  Her husband [name] (who is the son of my former patient Aniylah Avans) is semiretired, working for a Financial risk analyst.  What he likes to do is hunt and fish.  Their son Iantha Fallen had significant muscular dystrophy and died in 21-Mar-2007. Their daughter Marylu Lund lives in [place] and works in an office.  The patient has two granddaughters, both RNs, one working at [employer] and the other in [place].    ADVANCED DIRECTIVES:  HEALTH MAINTENANCE:  (Updated November 2014) History  Substance Use Topics  . Smoking status: Never Smoker   .  Smokeless tobacco: Never Used  . Alcohol Use: No     Colonoscopy:  July 2013, Dr. [name]  PAP: s/p hysterectomy  Bone density:  November 2014 at [facility], normal  Lipid panel: Dr. Timothy Lasso    Allergies  Allergen Reactions  . Niacin Other (See Comments)    Dizziness   . Promethazine Hcl Other (See Comments)    Dizziness "FELT CRAZY"     Current Outpatient Prescriptions  Medication Sig Dispense Refill  . beclomethasone (QVAR) 40 MCG/ACT inhaler Inhale 2 puffs into the lungs at bedtime.      Marland Kitchen BYSTOLIC 5 MG tablet Take 1 tablet by mouth Daily.      . clonazePAM (KLONOPIN) 0.5 MG tablet Take 1 tablet (0.5 mg total) by mouth 3 (three) times daily as needed for anxiety.  90 tablet  0  . CRESTOR 5 MG tablet Take 1 tablet by mouth Daily.      Marland Kitchen glimepiride (AMARYL) 1 MG tablet Take 0.5 tablets (0.5 mg  total) by mouth if am blood sugar is > 125 and eating well.  Watch for lows  90 tablet  1  . hydrocortisone (ANUSOL-HC) 2.5 % rectal cream Place rectally 4 (four) times daily.  30 g  0  . letrozole (FEMARA) 2.5 MG tablet Take 1 tablet (2.5 mg total) by mouth daily.  90 tablet  4  . lidocaine (XYLOCAINE) 5 % ointment Apply topically 4 (four) times daily as needed.  35.44 g  0  . lidocaine-hydrocortisone (ANAMANTEL HC) 3-0.5 % CREA Place 1 Applicatorful rectally 2 (two) times daily as needed for hemorrhoids and bleeding.  28.35 g  0  . NEXIUM 40 MG capsule Take 1 tablet by mouth Daily.      . ONGLYZA 5 MG TABS tablet Take 5 mg by mouth daily.       Marland Kitchen PROAIR HFA 108 (90 BASE) MCG/ACT inhaler       . saccharomyces boulardii (FLORASTOR) 250 MG capsule Take 1 capsule (250 mg total) by mouth 2 (two) times daily.  20 capsule  0  . Alum Hydroxide-Mag Carbonate (GAVISCON PO) Take 1 capsule by mouth daily.      Marland Kitchen gabapentin (NEURONTIN) 100 MG capsule Take 100 mg by mouth.      Marland Kitchen HYDROcodone-acetaminophen (NORCO/VICODIN) 5-325 MG per tablet Take 1 tablet by mouth every 6 (six) hours as needed for moderate pain.  30 tablet  0  . meclizine (ANTIVERT) 25 MG tablet Take 1 tablet by mouth as needed. dizziness       No current facility-administered medications for this visit.    OBJECTIVE: Middle-aged white woman who appears nervous but is in no acute distress Filed Vitals:   09/10/13 1014  BP: 128/75  Pulse: 76  Temp: 97.4 F (36.3 C)  Resp: 19     Body mass index is 25.17 kg/(m^2).    ECOG FS: 1 Filed Weights   09/10/13 1014  Weight: 146 lb 11.2 oz (66.543 kg)   Physical Exam: HEENT:  Sclerae anicteric.  Oropharynx clear. No ulcerations or evidence of candidiasis. Dentition is fair. NODES:  No cervical, supraclavicular, or axillary lymphadenopathy palpated.  BREAST EXAM: Right breast is unremarkable. Patient is status post left mastectomy and radiation, with some increased telangiectasia noted on  the left chest wall secondary to radiation, but no evidence of local recurrence. LUNGS:  Clear to auscultation bilaterally.  No wheezes or rhonchi. Good excursion bilaterally. HEART:  Regular rate and rhythm. No murmur  appreciated ABDOMEN:  Soft, nontender.  Positive bowel sounds.  MSK:  No focal spinal tenderness to palpation. Good range of motion in the upper extremities. Straight leg raise is somewhat limited in the right lower extremity secondary to lower back pain. EXTREMITIES:  No peripheral edema.   NEURO:  Nonfocal. Well oriented.   aAnxiousffect.    LAB RESULTS: Lab Results  Component Value Date   WBC 5.2 09/10/2013   NEUTROABS 3.4 09/10/2013   HGB 13.5 09/10/2013   HCT 39.5 09/10/2013   MCV 94.7 09/10/2013   PLT 214 09/10/2013      Chemistry      Component Value Date/Time   NA 143 08/13/2013 0944   NA 139 05/18/2013 0430   K 3.9 08/13/2013 0944   K 3.8 05/18/2013 0430   CL 105 05/18/2013 0430   CL 103 03/22/2013 0754   CO2 25 08/13/2013 0944   CO2 27 05/18/2013 0430   BUN 8.6 08/13/2013 0944   BUN 4* 05/18/2013 0430   CREATININE 0.7 08/13/2013 0944   CREATININE 0.59 05/18/2013 0430      Component Value Date/Time   CALCIUM 9.2 08/13/2013 0944   CALCIUM 8.9 05/18/2013 0430   ALKPHOS 83 08/13/2013 0944   ALKPHOS 57 05/14/2013 2113   AST 18 08/13/2013 0944   AST 14 05/14/2013 2113   ALT 14 08/13/2013 0944   ALT 10 05/14/2013 2113   BILITOT 0.23 08/13/2013 0944   BILITOT 0.2* 05/14/2013 2113       Lab Results  Component Value Date   LABCA2 57* 08/09/2012    STUDIES:  Most recent bone density at [facility] 08/21/2013 was normal.   Most recent right screening mammogram was 06/25/2013 and was unremarkable.    ASSESSMENT:76 y.o.  Mardene Sayer woman    (1) status post left modified radical mastectomy in January 2007 for a T1c N1, stage IIA  invasive lobular carcinoma, grade 1, strongly estrogen and progesterone receptor positive, HER-2 negative, with an  MIB-1 of 7%.  (2)  Status post radiation given concurrently with Capecitabine.  Declined chemotherapy.    (3) Status post anastrozole and exemestane, both discontinued due to aches and pains.  Status post tamoxifen between October 2007 until August 2011, discontinued secondary to cramps   (4) multiple sclerotic bony lesions noted at initial diagnosis, biopsied x2 August 2009 without malignancy documented, on zoledronic acid annually starting 12/12/2007. Increased to monthly starting 07/17/2013  (5) On fulvestrant between September 2011 and 05/17/2013 with likely progression of bony disease  (6)  radiation to the lumbosacral spine and right hip to treat right hip pain to 30 gray completed 05/11/2013, complicated by [possible] radiation induced colitis  (7)  on letrozole since September 2014.  (8) receiving zoledronic acid monthly basis beginning September 2014, the plan being to continue treating monthly x6 months, then go to a every 2 month schedule.     PLAN:   Over half of our 45 minute appointment today was spent discussing Jamie [name]'s concerns, reviewing treatment options, and coordinating care.   Jamie [name] is tolerating the letrozole well and will continue as before at 2.5 mg daily. I am a little concerned about the acute increase in jaw pain, and we are ordering a panoramic x-rays for further evaluation. Her zoledronic acid will be held today pending further evaluation.  I am also concerned about her increased back pain and lower extremity weakness. We will order an MRI of the lumbar sacral spine for further evaluation and assessment for possible cord  compression or compression fracture.    In the meanwhile, Jamie [name] and I both agree that she will discontinue the oxycodone/APAP, and I have prescribed hydrocodone/APAP, 5/325 mg, to use in its place. We'll also increase her Klonopin  from twice daily to 3 times daily. She was given prescriptions for both of these medications today. We  discussed starting a daily medication such as Celexa, but at this time she was "like to stick with what she knows" and does not want to start any additional medications.  If her anxiety attacks do not improve significantly, we'll refer her for additional evaluation with a psychiatrist or psychologist.  I will plan on seeing her back in approximately 2 weeks for followup of the above issues and review of the panoramic x-ray and spinal MRI. She's been scheduled for her next dose of zoledronic acid on December 22. Our plan is to continue her current regimen and repeat a bone scan in late December to assess response, and this has already been scheduled for December 29. She'll see Dr. Darnelle Catalan the following week to review those results on January 6.  All this was reviewed in detail with Jamie [name] today, and she was also given all of the above instructions in writing. She voices understanding and agreement with our plan, and will call with any changes or problems prior to her next followup visit.  Shantanu Strauch PA-C    09/10/2013

## 2013-09-21 ENCOUNTER — Inpatient Hospital Stay: Admission: RE | Admit: 2013-09-21 | Payer: Medicare Other | Source: Ambulatory Visit

## 2013-09-21 ENCOUNTER — Telehealth: Payer: Self-pay | Admitting: *Deleted

## 2013-09-21 NOTE — Telephone Encounter (Signed)
This RN attempted to contact pt per her message stating she does not want to proceed with scan today but wanted to speak with this RN.  Pt's number busy x 2.

## 2013-09-26 ENCOUNTER — Ambulatory Visit (HOSPITAL_BASED_OUTPATIENT_CLINIC_OR_DEPARTMENT_OTHER): Payer: Medicare Other | Admitting: Physician Assistant

## 2013-09-26 ENCOUNTER — Encounter (INDEPENDENT_AMBULATORY_CARE_PROVIDER_SITE_OTHER): Payer: Self-pay

## 2013-09-26 ENCOUNTER — Encounter: Payer: Self-pay | Admitting: Physician Assistant

## 2013-09-26 ENCOUNTER — Other Ambulatory Visit: Payer: Self-pay | Admitting: Physician Assistant

## 2013-09-26 ENCOUNTER — Other Ambulatory Visit (HOSPITAL_BASED_OUTPATIENT_CLINIC_OR_DEPARTMENT_OTHER): Payer: Medicare Other

## 2013-09-26 ENCOUNTER — Ambulatory Visit (HOSPITAL_COMMUNITY)
Admission: RE | Admit: 2013-09-26 | Discharge: 2013-09-26 | Disposition: A | Discharge status: Home / Self Care | Payer: Medicare Other | Source: Ambulatory Visit | Attending: Physician Assistant | Admitting: Physician Assistant

## 2013-09-26 ENCOUNTER — Telehealth: Payer: Self-pay | Admitting: *Deleted

## 2013-09-26 VITALS — BP 138/73 | HR 98 | Temp 97.7°F | Resp 18 | Ht 64.0 in | Wt 150.6 lb

## 2013-09-26 DIAGNOSIS — C50919 Malignant neoplasm of unspecified site of unspecified female breast: Secondary | ICD-10-CM | POA: Insufficient documentation

## 2013-09-26 DIAGNOSIS — R6884 Jaw pain: Secondary | ICD-10-CM

## 2013-09-26 DIAGNOSIS — C7951 Secondary malignant neoplasm of bone: Secondary | ICD-10-CM | POA: Insufficient documentation

## 2013-09-26 DIAGNOSIS — F411 Generalized anxiety disorder: Secondary | ICD-10-CM

## 2013-09-26 DIAGNOSIS — C50912 Malignant neoplasm of unspecified site of left female breast: Secondary | ICD-10-CM

## 2013-09-26 LAB — COMPREHENSIVE METABOLIC PANEL (CC13)
AST: 20 U/L (ref 5–34)
Albumin: 3.7 g/dL (ref 3.5–5.0)
Alkaline Phosphatase: 80 U/L (ref 40–150)
Anion Gap: 12 mEq/L — ABNORMAL HIGH (ref 3–11)
BUN: 10 mg/dL (ref 7.0–26.0)
CO2: 25 mEq/L (ref 22–29)
Creatinine: 0.7 mg/dL (ref 0.6–1.1)
Glucose: 107 mg/dl (ref 70–140)
Total Bilirubin: 0.22 mg/dL (ref 0.20–1.20)

## 2013-09-26 LAB — CBC WITH DIFFERENTIAL/PLATELET
BASO%: 0.9 % (ref 0.0–2.0)
EOS%: 2.8 % (ref 0.0–7.0)
Eosinophils Absolute: 0.1 10*3/uL (ref 0.0–0.5)
HCT: 37.5 % (ref 34.8–46.6)
LYMPH%: 27.3 % (ref 14.0–49.7)
MCHC: 33.7 g/dL (ref 31.5–36.0)
MCV: 96.1 fL (ref 79.5–101.0)
MONO#: 0.5 10*3/uL (ref 0.1–0.9)
MONO%: 10.5 % (ref 0.0–14.0)
NEUT#: 2.7 10*3/uL (ref 1.5–6.5)
NEUT%: 58.5 % (ref 38.4–76.8)
Platelets: 211 10*3/uL (ref 145–400)
RBC: 3.9 10*6/uL (ref 3.70–5.45)
WBC: 4.6 10*3/uL (ref 3.9–10.3)

## 2013-09-26 NOTE — Progress Notes (Signed)
ID: Jamie [name]   DOB: 1936-09-12  MR#: [id number]  ONG#:[id number]  PCP: Gwen Pounds, MD GYN: Jamie [name] SU: OTHER MD: Jamie [name], Jamie [name]  CHIEF COMPLAINT:  Metastatic Breast Cancer   HISTORY OF PRESENT ILLNESS: The patient's cancer was uncovered by an unusually circuitous route. She presented with stomach upset.  Because of a history of colitis, Dr. Dorena Cookey who happened to be on call for Dr. Matthias Hughs, obtained CT of the abdomen and pelvis on August 30, 2005.  As far as the abdomen and pelvis were concerned, there was no evidence of colitis but there were multiple scattered sclerotic lesions in the bone suggesting possible sclerotic metastatic disease (versus osteopoikilosis.)  Accordingly a bone scan was obtained.  This was done on September 01, 2005, and found tiny sclerotic lesions, and in particular a lesion of concern in the sternum.  Accordingly a CT scan of the chest was obtained.  This found the sternal problem to correspond with degenerative changes. However, it also showed a soft tissue nodule in the left breast.  The patient then had mammograms on September 27, 2005.  This found two areas of spiculation and architectural distortion in the upper outer quadrant of the left breast.  By ultrasound these were hyperechoic and irregular, highly suspicious for carcinoma.  Biopsy was obtained the same day and showed ([id number], as well as [phone]) invasive lobular carcinoma, with both lesions biopsied being ER and PR positive, both herceptest positive, both negative by FISH.  Accordingly the patient was referred to Dr. Corliss Skains and breast MRI was obtained October 06, 2006.  No abnormal enhancement was noted in the right breast.  There was enhancing nodularity in the upper portion of the left breast where several discrete nodules were seen.  Accordingly Dr. Corliss Skains performed a left modified radical mastectomy on November 02, 2005.  The patient had a positive sentinel lymph node  and Dr. Corliss Skains performed a left axillary lymph node dissection.  However, only three additional lymph nodes were recovered, two of which also showed metastatic involvement (all had extra capsular extension.)  The patient's subsequent history is as detailed below  INTERVAL HISTORY: Jamie [name] returns alone today for followup of her metastatic breast carcinoma.  She continues on letrozole daily, which she is tolerating well with no hot flashes, no vaginal dryness, and no increased joint pain.   She has been receiving zoledronic acid on a monthly basis. We did hold the infusion 2 weeks ago due to a recent onset of jaw pain. A panoramic x-ray had been requested, but for some reason was never scheduled.  Fortunately, Jamie [name] tells me that the jaw pain has improved, but not completely resolved since her appointment here 2 weeks ago. Her next infusion of zoledronic acid as scheduled 2 weeks from now, December 22.  We had also requested an MRI of the lumbar spine to evaluate increased back pain, radiating down the right leg with some lower extremity weakness. Jamie [name] tells me that within a few days after our visit, the pain improved significantly. Previously it was a 10 on a scale of 1-10 when she woke up in the mornings. Now it only bothers her after she sits for a prolonged period of time and she describes it as feeling "stiff" rather than being in pain. She still has some radiating pain in the right leg but this has also improved. She denies any current weakness. She's had no additional falls. She continues to deny any signs of neuropathy in  the lower extremities.    Jamie [name] has a history of IBS which worsens when she becomes anxious. Prior to the scheduled MRI, she began having a lot of diarrhea, and actually called and canceled the appointment. After much discussion today, she feels like she would like to hold off from ordering another spinal MRI since the pain has improved. She is already scheduled for a bone  scan later this month as well.  She is now taking less pain medication, currently only Tylenol if needed. She finds that both oxycodone and the recently prescribed hydrocodone increase her panic attacks, and she tolerates both very poorly.   REVIEW OF SYSTEMS: Jamie [name] has had no fevers, chills, or night sweats. She her energy level is fair. She is trying to keep herself well hydrated. She denies any problems with nausea or emesis. The diarrhea has improved, and she's had no loose stools for the past 2 days. She denies any change in urinary habits. She continues to have some shortness of breath with exertion which is stable. She has an occasional dry cough she treats to postnasal drip. She's had no phlegm production, orthopnea, chest pain, or palpitations. She denies any abnormal headaches, dizziness, or change in vision.  She's had no peripheral swelling.   A detailed review of systems is otherwise noncontributory.   PAST MEDICAL HISTORY: Past Medical History  Diagnosis Date  . Anxiety   . Asthma   . Breast cancer     metastasized  . Depression   . Diabetes mellitus   . GERD (gastroesophageal reflux disease)   . Hyperlipidemia   . Internal hemorrhoids   . Hx of adenomatous colonic polyps   . IBS (irritable bowel syndrome)   . Mitral valve prolapse   . Ischemic colitis   . Fibromyalgia   . Allergy   . Cataract   . S/P radiation therapy within four to twelve weeks 04/26/13-05/11/13    L-spine/sacrum 30Gy/84fx  Significant for diabetes, hypercholesterolemia, asthma, mitral valve prolapse requiring antibiotic prophylaxis before any invasive procedures, Meniere's disease, gastroesophageal reflux disease, osteoarthritis, degenerative disk disease, history of fibromyalgia, panic attacks, history of synovial cyst removal, history of total hysterectomy with bilateral salpingo-oophorectomy, history of septoplasty x2, status post appendectomy, status post dilatation and curettage remotely, status  post bilateral carpal tunnel release under Dr. Rosanne Ashing [place].  PAST SURGICAL HISTORY: Past Surgical History  Procedure Laterality Date  . Mastectomy Left     then treated with chemo and radiation  . Total abdominal hysterectomy    . Back surgery      synovial cyst removed from spine  . Carpal tunnel release Bilateral   . Dilation and curettage of uterus    . Appendectomy    . Cataract surgery Bilateral     FAMILY HISTORY The patient's father died at the age of 50 from a stroke.  The patient's mother died at the age of 75, also from a stroke.  The patient has three brothers; one died with "liver disease."  One has prostate cancer.  The other brother has prostate and colon cancer.  The only breast cancer in the family was the patient's mother's mother and she does not know how old her grandmother was when she was diagnosed.  There is no history of ovarian cancer in the family.  GYNECOLOGIC HISTORY: She is GXP2.  She had her hysterectomy while still menstruating and she took hormone replacement until December 2006, when she had her breast biopsy.  SOCIAL HISTORY: (Updated November 2014) She  used to work as a Lawyer.  Her husband Jamie [name] (who is the son of my former patient Jamie [name]) is semiretired, working for a Financial risk analyst.  What he likes to do is hunt and fish.  Their son Jamie [name] had significant muscular dystrophy and died in Mar 04, 2007. Their daughter Jamie [name] lives in [place] and works in an office.  The patient has two granddaughters, both Jamie [name], one working at [employer] and the other in [place].    ADVANCED DIRECTIVES:  HEALTH MAINTENANCE:  (Updated November 2014) History  Substance Use Topics  . Smoking status: Never Smoker   . Smokeless tobacco: Never Used  . Alcohol Use: No     Colonoscopy:  July 2013, Dr. [name]  PAP: s/p hysterectomy  Bone density:  November 2014 at [facility], normal  Lipid panel: Dr. Timothy Lasso    Allergies  Allergen Reactions  .  Niacin Other (See Comments)    Dizziness   . Promethazine Hcl Other (See Comments)    Dizziness "FELT CRAZY"     Current Outpatient Prescriptions  Medication Sig Dispense Refill  . beclomethasone (QVAR) 40 MCG/ACT inhaler Inhale 2 puffs into the lungs at bedtime.      Marland Kitchen BYSTOLIC 5 MG tablet Take 1 tablet by mouth Daily.      . clonazePAM (KLONOPIN) 0.5 MG tablet Take 1 tablet (0.5 mg total) by mouth 3 (three) times daily as needed for anxiety.  90 tablet  0  . CRESTOR 5 MG tablet Take 1 tablet by mouth Daily.      Marland Kitchen glimepiride (AMARYL) 1 MG tablet Take 0.5 tablets (0.5 mg total) by mouth if am blood sugar is > 125 and eating well.  Watch for lows  90 tablet  1  . hydrocortisone (ANUSOL-HC) 2.5 % rectal cream Place rectally 4 (four) times daily.  30 g  0  . letrozole (FEMARA) 2.5 MG tablet Take 1 tablet (2.5 mg total) by mouth daily.  90 tablet  4  . lidocaine (XYLOCAINE) 5 % ointment Apply topically 4 (four) times daily as needed.  35.44 g  0  . lidocaine-hydrocortisone (ANAMANTEL HC) 3-0.5 % CREA Place 1 Applicatorful rectally 2 (two) times daily as needed for hemorrhoids and bleeding.  28.35 g  0  . NEXIUM 40 MG capsule Take 1 tablet by mouth Daily.      . ONGLYZA 5 MG TABS tablet Take 5 mg by mouth daily.       Marland Kitchen PROAIR HFA 108 (90 BASE) MCG/ACT inhaler       . Alum Hydroxide-Mag Carbonate (GAVISCON PO) Take 1 capsule by mouth daily.      Marland Kitchen gabapentin (NEURONTIN) 100 MG capsule Take 100 mg by mouth.      Marland Kitchen HYDROcodone-acetaminophen (NORCO/VICODIN) 5-325 MG per tablet Take 1 tablet by mouth every 6 (six) hours as needed for moderate pain.  30 tablet  0  . meclizine (ANTIVERT) 25 MG tablet Take 1 tablet by mouth as needed. dizziness      . metoCLOPramide (REGLAN) 5 MG tablet       . saccharomyces boulardii (FLORASTOR) 250 MG capsule Take 1 capsule (250 mg total) by mouth 2 (two) times daily.  20 capsule  0   No current facility-administered medications for this visit.    OBJECTIVE:  Middle-aged white woman who appears anxious but is in no acute distress Filed Vitals:   09/26/13 0829  BP: 138/73  Pulse: 98  Temp: 97.7 F (36.5 C)  Resp: 18  Body mass index is 25.84 kg/(m^2).    ECOG FS: 1 Filed Weights   09/26/13 0829  Weight: 150 lb 9.6 oz (68.312 kg)   Physical Exam: HEENT:  Sclerae anicteric.  Oropharynx clear and moist.  Dentition is fair. NODES:  No cervical, supraclavicular, or axillary lymphadenopathy palpated.  BREAST EXAM: Deferred. LUNGS:  Clear to auscultation bilaterally.  No wheezes or rhonchi.  HEART:  Regular rate and rhythm. No murmur appreciated ABDOMEN:  Soft,  nondistended, nontender.   No organomegaly or masses palpated. Positive bowel sounds.  MSK:  No focal spinal tenderness to palpation. Good range of motion in the upper extremities.  Able to straight leg raise bilaterally with no significant pain. EXTREMITIES:  No peripheral edema.   NEURO:  Nonfocal. Well oriented.  Anxious affect.    LAB RESULTS: Lab Results  Component Value Date   WBC 4.6 09/26/2013   NEUTROABS 2.7 09/26/2013   HGB 12.6 09/26/2013   HCT 37.5 09/26/2013   MCV 96.1 09/26/2013   PLT 211 09/26/2013      Chemistry      Component Value Date/Time   NA 144 09/26/2013 0806   NA 139 05/18/2013 0430   K 4.1 09/26/2013 0806   K 3.8 05/18/2013 0430   CL 105 05/18/2013 0430   CL 103 03/22/2013 0754   CO2 25 09/26/2013 0806   CO2 27 05/18/2013 0430   BUN 10.0 09/26/2013 0806   BUN 4* 05/18/2013 0430   CREATININE 0.7 09/26/2013 0806   CREATININE 0.59 05/18/2013 0430      Component Value Date/Time   CALCIUM 9.5 09/26/2013 0806   CALCIUM 8.9 05/18/2013 0430   ALKPHOS 80 09/26/2013 0806   ALKPHOS 57 05/14/2013 2113   AST 20 09/26/2013 0806   AST 14 05/14/2013 2113   ALT 19 09/26/2013 0806   ALT 10 05/14/2013 2113   BILITOT 0.22 09/26/2013 0806   BILITOT 0.2* 05/14/2013 2113       STUDIES:  Most recent bone density at [facility] 08/21/2013 was normal.    Most recent right screening mammogram was 06/25/2013 and was unremarkable.  Dg Mandible 4 Views  09/26/2013   CLINICAL DATA:  The patient has known metastatic breast malignancy to bone. The patient is now complaining of mandibular pain left greater than right.  EXAM: MANDIBLE - 4+ VIEW  COMPARISON:  Nuclear bone scan dated July 10, 2013  FINDINGS: The mandibular body and rami appear adequately mineralized. There is no definite lytic or blastic lesion or periosteal reaction. As best as can be determined the mandibular condyles are intact.  IMPRESSION: There is no plain film evidence of metastatic disease to the mandible.   Electronically Signed   By: David  [place]   On: 09/26/2013 10:55      ASSESSMENT:76 y.o.  Mardene Sayer woman    (1) status post left modified radical mastectomy in January 2007 for a T1c N1, stage IIA  invasive lobular carcinoma, grade 1, strongly estrogen and progesterone receptor positive, HER-2 negative, with an MIB-1 of 7%.  (2)  Status post radiation given concurrently with Capecitabine.  Declined chemotherapy.    (3) Status post anastrozole and exemestane, both discontinued due to aches and pains.  Status post tamoxifen between October 2007 until August 2011, discontinued secondary to cramps   (4) multiple sclerotic bony lesions noted at initial diagnosis, biopsied x2 August 2009 without malignancy documented, on zoledronic acid annually starting 12/12/2007. Increased to monthly starting 07/17/2013  (5) On fulvestrant between September 2011  and 05/17/2013 with likely progression of bony disease  (6)  radiation to the lumbosacral spine and right hip to treat right hip pain to 30 gray completed 05/11/2013, complicated by [possible] radiation induced colitis  (7)  on letrozole since September 2014.  (8) receiving zoledronic acid monthly basis beginning September 2014, the plan being to continue treating monthly x6 months, then go to a every 2 month schedule.      PLAN:  Lorian appears to be feeling much better, especially with regards to her pain. As noted above, should like to hold off on the lumbar MRI unless the pain worsens once again.  We were able to schedule her panoramic x-ray today, and fortunately this showed no bony lesions and no evidence of osteonecrosis. Accordingly, she will keep her appointment here in 2 weeks for zoledronic acid on December 22. The plan is to continue with zoledronic acid monthly x6 months (through February 2015), then go to an every 2 month schedule from that point forward.  Mishti is scheduled for a restaging bone scan on December 29, and will see Dr. Darnelle Catalan the following week on January 6 to review those results and discuss her treatment plan. For now, we are making no changes to her current regimen.  She will also continue on letrozole daily.  All this was reviewed in detail with Tynslee today, and she voices understanding and agreement with our plan.  She  will call with any changes or problems prior to her next followup visit.  Tish Begin PA-C     09/26/2013

## 2013-09-26 NOTE — Telephone Encounter (Signed)
Per PA-C request. Called patient. No answer. Left a message to let her know her jawbone looks good and for her to keep her appt to receive Zometa on Dec. 22, 2014. I will continue to call her until I can speak with her as well. Message forwarded to Zollie Scale, PA-C

## 2013-10-08 ENCOUNTER — Other Ambulatory Visit: Payer: Medicare Other | Admitting: Lab

## 2013-10-08 ENCOUNTER — Other Ambulatory Visit (HOSPITAL_BASED_OUTPATIENT_CLINIC_OR_DEPARTMENT_OTHER): Payer: Medicare Other

## 2013-10-08 ENCOUNTER — Ambulatory Visit (HOSPITAL_BASED_OUTPATIENT_CLINIC_OR_DEPARTMENT_OTHER): Payer: Medicare Other

## 2013-10-08 VITALS — BP 141/45 | HR 61 | Temp 97.6°F | Resp 18

## 2013-10-08 DIAGNOSIS — Z853 Personal history of malignant neoplasm of breast: Secondary | ICD-10-CM

## 2013-10-08 DIAGNOSIS — C7951 Secondary malignant neoplasm of bone: Secondary | ICD-10-CM

## 2013-10-08 DIAGNOSIS — C50419 Malignant neoplasm of upper-outer quadrant of unspecified female breast: Secondary | ICD-10-CM

## 2013-10-08 LAB — CBC WITH DIFFERENTIAL/PLATELET
BASO%: 0.5 % (ref 0.0–2.0)
Eosinophils Absolute: 0.1 10*3/uL (ref 0.0–0.5)
MCHC: 34.4 g/dL (ref 31.5–36.0)
MCV: 96.1 fL (ref 79.5–101.0)
MONO#: 0.4 10*3/uL (ref 0.1–0.9)
MONO%: 10 % (ref 0.0–14.0)
NEUT#: 2.6 10*3/uL (ref 1.5–6.5)
NEUT%: 59.9 % (ref 38.4–76.8)
RBC: 3.73 10*6/uL (ref 3.70–5.45)
RDW: 13.3 % (ref 11.2–14.5)
WBC: 4.4 10*3/uL (ref 3.9–10.3)

## 2013-10-08 LAB — COMPREHENSIVE METABOLIC PANEL (CC13)
ALT: 15 U/L (ref 0–55)
AST: 19 U/L (ref 5–34)
Albumin: 3.6 g/dL (ref 3.5–5.0)
Alkaline Phosphatase: 73 U/L (ref 40–150)
Calcium: 9.3 mg/dL (ref 8.4–10.4)
Creatinine: 0.7 mg/dL (ref 0.6–1.1)
Glucose: 95 mg/dl (ref 70–140)
Potassium: 3.8 mEq/L (ref 3.5–5.1)
Sodium: 141 mEq/L (ref 136–145)
Total Protein: 6.8 g/dL (ref 6.4–8.3)

## 2013-10-08 MED ORDER — ZOLEDRONIC ACID 4 MG/100ML IV SOLN
4.0000 mg | Freq: Once | INTRAVENOUS | Status: AC
  Administered 2013-10-08: 4 mg via INTRAVENOUS
  Filled 2013-10-08: qty 100

## 2013-10-08 NOTE — Patient Instructions (Signed)

## 2013-10-15 ENCOUNTER — Encounter (HOSPITAL_COMMUNITY)
Admission: RE | Admit: 2013-10-15 | Discharge: 2013-10-15 | Disposition: A | Discharge status: Home / Self Care | Payer: Medicare Other | Source: Ambulatory Visit | Attending: Oncology | Admitting: Oncology

## 2013-10-15 DIAGNOSIS — M25559 Pain in unspecified hip: Secondary | ICD-10-CM | POA: Insufficient documentation

## 2013-10-15 DIAGNOSIS — C50919 Malignant neoplasm of unspecified site of unspecified female breast: Secondary | ICD-10-CM | POA: Insufficient documentation

## 2013-10-15 DIAGNOSIS — C7951 Secondary malignant neoplasm of bone: Secondary | ICD-10-CM | POA: Insufficient documentation

## 2013-10-15 MED ORDER — TECHNETIUM TC 99M MEDRONATE IV KIT
25.0000 | PACK | Freq: Once | INTRAVENOUS | Status: AC | PRN
  Administered 2013-10-15: 25 via INTRAVENOUS

## 2013-10-23 ENCOUNTER — Ambulatory Visit (HOSPITAL_BASED_OUTPATIENT_CLINIC_OR_DEPARTMENT_OTHER): Payer: Medicare HMO | Admitting: Oncology

## 2013-10-23 ENCOUNTER — Ambulatory Visit: Payer: Medicare Other | Admitting: Oncology

## 2013-10-23 VITALS — BP 130/76 | HR 76 | Temp 97.6°F | Resp 18 | Ht 64.0 in | Wt 148.6 lb

## 2013-10-23 DIAGNOSIS — C50919 Malignant neoplasm of unspecified site of unspecified female breast: Secondary | ICD-10-CM

## 2013-10-23 DIAGNOSIS — C7952 Secondary malignant neoplasm of bone marrow: Secondary | ICD-10-CM

## 2013-10-23 DIAGNOSIS — C7951 Secondary malignant neoplasm of bone: Secondary | ICD-10-CM

## 2013-10-23 DIAGNOSIS — C50419 Malignant neoplasm of upper-outer quadrant of unspecified female breast: Secondary | ICD-10-CM

## 2013-10-23 DIAGNOSIS — G2581 Restless legs syndrome: Secondary | ICD-10-CM

## 2013-10-23 DIAGNOSIS — K5900 Constipation, unspecified: Secondary | ICD-10-CM

## 2013-10-23 NOTE — Progress Notes (Signed)
ID: Jamie [name]   DOB: 18-Mar-1936  MR#: [id number]  XBD#:[id number]  PCP: Jamie Reel, MD GYN: Jamie [name] SU: OTHER MD: Jamie [name], Jamie [name]  CHIEF COMPLAINT:  Metastatic Breast Cancer   HISTORY OF PRESENT ILLNESS: The patient's cancer was uncovered by an unusually circuitous route. She presented with stomach upset.  Because of a history of colitis, Dr. Teena [name] who happened to be on call for Dr. Cristina [name], obtained CT of the abdomen and pelvis on August 30, 2005.  As far as the abdomen and pelvis were concerned, there was no evidence of colitis but there were multiple scattered sclerotic lesions in the bone suggesting possible sclerotic metastatic disease (versus osteopoikilosis.)  Accordingly a bone scan was obtained.  This was done on September 01, 2005, and found tiny sclerotic lesions, and in particular a lesion of concern in the sternum.  Accordingly a CT scan of the chest was obtained.  This found the sternal problem to correspond with degenerative changes. However, it also showed a soft tissue nodule in the left breast.  The patient then had mammograms on September 27, 2005.  This found two areas of spiculation and architectural distortion in the upper outer quadrant of the left breast.  By ultrasound these were hyperechoic and irregular, highly suspicious for carcinoma.  Biopsy was obtained the same day and showed ([id number], as well as [phone]) invasive lobular carcinoma, with both lesions biopsied being ER and PR positive, both herceptest positive, both negative by FISH.  Accordingly the patient was referred to Dr. Georgette [name] and breast MRI was obtained October 06, 2006.  No abnormal enhancement was noted in the right breast.  There was enhancing nodularity in the upper portion of the left breast where several discrete nodules were seen.  Accordingly Dr. Georgette [name] performed a left modified radical mastectomy on November 02, 2005.  The patient had a positive sentinel lymph node  and Dr. Georgette [name] performed a left axillary lymph node dissection.  However, only three additional lymph nodes were recovered, two of which also showed metastatic involvement (all had extra capsular extension.)  The patient's subsequent history is as detailed below  INTERVAL HISTORY: Jamie [name] returns alone today for followup of her metastatic breast carcinoma accompanied by her friend Jamie [name]. Jamie [name] is tolerating the letrozole with no side effects that she is aware of. Currently she is getting it "at no cost". She also tolerates the monthly is alendronate without any side effects that she is aware  REVIEW OF SYSTEMS: Jamie [name] is feeling "good". She continues to have some back and joint pain here in there, but this is minimal and let us in assistance than before. She remains anxious and depressed, but not suicidal. She has developed restless legs, which sometimes keep her from sleeping at night. She does have problems with blurred vision at times, and last week she had significant diarrhea. This week she is more on the constipated side. Aside from these problems, a detailed review of systems today was noncontributory  PAST MEDICAL HISTORY: Past Medical History  Diagnosis Date  . Anxiety   . Asthma   . Breast cancer     metastasized  . Depression   . Diabetes mellitus   . GERD (gastroesophageal reflux disease)   . Hyperlipidemia   . Internal hemorrhoids   . Hx of adenomatous colonic polyps   . IBS (irritable bowel syndrome)   . Mitral valve prolapse   . Ischemic colitis   . Fibromyalgia   . Allergy   .  Cataract   . S/P radiation therapy within four to twelve weeks 04/26/13-05/11/13    L-spine/sacrum 30Gy/93fx  Significant for diabetes, hypercholesterolemia, asthma, mitral valve prolapse requiring antibiotic prophylaxis before any invasive procedures, Meniere's disease, gastroesophageal reflux disease, osteoarthritis, degenerative disk disease, history of fibromyalgia, panic attacks, history of  synovial cyst removal, history of total hysterectomy with bilateral salpingo-oophorectomy, history of septoplasty x2, status post appendectomy, status post dilatation and curettage remotely, status post bilateral carpal tunnel release under Dr. Clair Gulling [place].  PAST SURGICAL HISTORY: Past Surgical History  Procedure Laterality Date  . Mastectomy Left     then treated with chemo and radiation  . Total abdominal hysterectomy    . Back surgery      synovial cyst removed from spine  . Carpal tunnel release Bilateral   . Dilation and curettage of uterus    . Appendectomy    . Cataract surgery Bilateral     FAMILY HISTORY The patient's father died at the age of 109 from a stroke.  The patient's mother died at the age of 69, also from a stroke.  The patient has three brothers; one died with "liver disease."  One has prostate cancer.  The other brother has prostate and colon cancer.  The only breast cancer in the family was the patient's mother's mother and she does not know how old her grandmother was when she was diagnosed.  There is no history of ovarian cancer in the family.  GYNECOLOGIC HISTORY: She is GXP2.  She had her hysterectomy while still menstruating and she took hormone replacement until December 2006, when she had her breast biopsy.  SOCIAL HISTORY: (Updated November 2014) She used to work as a Quarry manager.  Her husband Jamie [name] (who is the son of my former patient Jamie [name]) is semiretired, working for a [employer].  What he likes to do is hunt and fish.  Their son Jamie [name] had significant muscular dystrophy and died in 01-04-07. Their daughter Jamie [name] lives in [place] and works in an office.  The patient has two granddaughters, both Jamie [name], one working at [employer] and the other in [place].    ADVANCED DIRECTIVES:  HEALTH MAINTENANCE:  (Updated November 2014) History  Substance Use Topics  . Smoking status: Never Smoker   . Smokeless tobacco: Never Used  . Alcohol Use: No      Colonoscopy:  July 2013, Dr. Olevia Perches  PAP: s/p hysterectomy  Bone density:  November 2014 [facility], normal  Lipid panel: Dr. Virgina Jock    Allergies  Allergen Reactions  . Hydrocodone Anxiety  . Oxycodone Anxiety  . Niacin Other (See Comments)    Dizziness   . Promethazine Hcl Other (See Comments)    Dizziness "FELT CRAZY"     Current Outpatient Prescriptions  Medication Sig Dispense Refill  . Alum Hydroxide-Mag Carbonate (GAVISCON PO) Take 1 capsule by mouth daily.      . beclomethasone (QVAR) 40 MCG/ACT inhaler Inhale 2 puffs into the lungs at bedtime.      Marland Kitchen BYSTOLIC 5 MG tablet Take 1 tablet by mouth Daily.      . clonazePAM (KLONOPIN) 0.5 MG tablet Take 1 tablet (0.5 mg total) by mouth 3 (three) times daily as needed for anxiety.  90 tablet  0  . CRESTOR 5 MG tablet Take 1 tablet by mouth Daily.      Marland Kitchen gabapentin (NEURONTIN) 100 MG capsule Take 100 mg by mouth.      Marland Kitchen glimepiride (AMARYL) 1  MG tablet Take 0.5 tablets (0.5 mg total) by mouth if am blood sugar is > 125 and eating well.  Watch for lows  90 tablet  1  . hydrocortisone (ANUSOL-HC) 2.5 % rectal cream Place rectally 4 (four) times daily.  30 g  0  . letrozole (FEMARA) 2.5 MG tablet Take 1 tablet (2.5 mg total) by mouth daily.  90 tablet  4  . lidocaine (XYLOCAINE) 5 % ointment Apply topically 4 (four) times daily as needed.  35.44 g  0  . lidocaine-hydrocortisone (ANAMANTEL HC) 3-0.5 % CREA Place 1 Applicatorful rectally 2 (two) times daily as needed for hemorrhoids and bleeding.  28.35 g  0  . meclizine (ANTIVERT) 25 MG tablet Take 1 tablet by mouth as needed. dizziness      . metoCLOPramide (REGLAN) 5 MG tablet       . NEXIUM 40 MG capsule Take 1 tablet by mouth Daily.      . ONGLYZA 5 MG TABS tablet Take 5 mg by mouth daily.       Marland Kitchen PROAIR HFA 108 (90 BASE) MCG/ACT inhaler       . saccharomyces boulardii (FLORASTOR) 250 MG capsule Take 1 capsule (250 mg total) by mouth 2 (two) times  daily.  20 capsule  0   No current facility-administered medications for this visit.    OBJECTIVE: Middle-aged white woman who appears stated age 86 Vitals:   10/23/13 1020  BP: 130/76  Pulse: 76  Temp: 97.6 F (36.4 C)  Resp: 18     Body mass index is 25.49 kg/(m^2).    ECOG FS: 1 Filed Weights   10/23/13 1020  Weight: 148 lb 9.6 oz (67.405 kg)   Sclerae unicteric, pupils equal and reactive Oropharynx clear and moist-- full upper plate, poor dentition lower teeth No cervical or supraclavicular adenopathy Lungs no rales or rhonchi Heart regular rate and rhythm, 3-3/2 systolic murmur appreciated Abd soft, nontender, positive bowel sounds MSK no focal spinal tenderness, no upper extremity lymphedema Neuro: nonfocal, well oriented, positive affect Breasts: The right breast is unremarkable. Left breast is status post modified radical mastectomy. There is no evidence of local recurrence. The left axilla is benign     LAB RESULTS: Lab Results  Component Value Date   WBC 4.4 10/08/2013   NEUTROABS 2.6 10/08/2013   HGB 12.3 10/08/2013   HCT 35.8 10/08/2013   MCV 96.1 10/08/2013   PLT 214 10/08/2013      Chemistry      Component Value Date/Time   NA 141 10/08/2013 0941   NA 139 05/18/2013 0430   K 3.8 10/08/2013 0941   K 3.8 05/18/2013 0430   CL 105 05/18/2013 0430   CL 103 03/22/2013 0754   CO2 29 10/08/2013 0941   CO2 27 05/18/2013 0430   BUN 12.0 10/08/2013 0941   BUN 4* 05/18/2013 0430   CREATININE 0.7 10/08/2013 0941   CREATININE 0.59 05/18/2013 0430      Component Value Date/Time   CALCIUM 9.3 10/08/2013 0941   CALCIUM 8.9 05/18/2013 0430   ALKPHOS 73 10/08/2013 0941   ALKPHOS 57 05/14/2013 2113   AST 19 10/08/2013 0941   AST 14 05/14/2013 2113   ALT 15 10/08/2013 0941   ALT 10 05/14/2013 2113   BILITOT 0.27 10/08/2013 0941   BILITOT 0.2* 05/14/2013 2113       STUDIES:  Most recent bone density at [facility] 08/21/2013 was normal.   Most  recent right screening mammogram  was 06/25/2013 and was unremarkable.  Dg Mandible 4 Views  09/26/2013   CLINICAL DATA:  The patient has known metastatic breast malignancy to bone. The patient is now complaining of mandibular pain left greater than right.  EXAM: MANDIBLE - 4+ VIEW  COMPARISON:  Nuclear bone scan dated July 10, 2013  FINDINGS: The mandibular body and rami appear adequately mineralized. There is no definite lytic or blastic lesion or periosteal reaction. As best as can be determined the mandibular condyles are intact.  IMPRESSION: There is no plain film evidence of metastatic disease to the mandible.   Electronically Signed   By: David  Martinique   On: 09/26/2013 10:55      ASSESSMENT:77 y.o.  Jamie [name] woman    (1) status post left modified radical mastectomy in January 2007 for a T1c N1, stage IIA  invasive lobular carcinoma, grade 1, strongly estrogen and progesterone receptor positive, HER-2 negative, with an MIB-1 of 7%.  (2)  Status post radiation given concurrently with Capecitabine.  Declined chemotherapy.    (3) Status post anastrozole and exemestane, both discontinued due to aches and pains.  Status post tamoxifen between October 2007 until August 2011, discontinued secondary to cramps   (4) multiple sclerotic bony lesions noted at initial diagnosis, biopsied x2 August 2009 without malignancy documented, on zoledronic acid annually starting 12/12/2007. Increased to monthly starting 07/17/2013  (5) On fulvestrant between September 2011 and 05/17/2013 with likely progression of bony disease  (6)  radiation to the lumbosacral spine and right hip to treat right hip pain to 30 gray completed 15/86/8257, complicated by [possible] radiation induced colitis  (7)  on letrozole since September 2014.  (8) receiving zoledronic acid monthly basis beginning September 2014, the plan being to continue treating monthly x6 months, then go to a every 2 month schedule.     PLAN:   Jamie [name] is doing remarkably well on letrozole, with no side effects that she is aware of. It appears to be keeping her cancer well under control. We're going to continue that as well as the monthly zolendronate through March. If everything continues well clinically at that time (we will only do physical exam and lab work) we will change the zolendronate 2 every 2 months for the next 6 months.  I suggested she could consider taking an iron tablet daily for her restless legs. She has gabapentin to take at bedtime up to 300 mg as needed. Otherwise she knows to call for any problems that may develop before her next visit here.  Chauncey Cruel, MD     10/23/2013

## 2013-10-24 ENCOUNTER — Telehealth: Payer: Self-pay | Admitting: *Deleted

## 2013-10-24 NOTE — Telephone Encounter (Signed)
sw pt gv appts for 12/31/13 w/ labs@ 10:45am, ov@ 11:15am, and tx to follow. i emailed MW to add the tx's...td

## 2013-10-24 NOTE — Telephone Encounter (Signed)
Per staff message and POF I have scheduled appts.  Jamie [name]  

## 2013-10-26 NOTE — Addendum Note (Signed)
Addended by: Laureen Abrahams on: 10/26/2013 06:20 PM   Modules accepted: Orders

## 2013-11-05 ENCOUNTER — Other Ambulatory Visit: Payer: Self-pay | Admitting: Physician Assistant

## 2013-11-05 ENCOUNTER — Other Ambulatory Visit: Payer: Medicare Other | Admitting: Lab

## 2013-11-05 ENCOUNTER — Ambulatory Visit: Payer: Commercial Managed Care - HMO

## 2013-11-05 ENCOUNTER — Other Ambulatory Visit (HOSPITAL_BASED_OUTPATIENT_CLINIC_OR_DEPARTMENT_OTHER): Payer: Commercial Managed Care - HMO

## 2013-11-05 DIAGNOSIS — Z853 Personal history of malignant neoplasm of breast: Secondary | ICD-10-CM

## 2013-11-05 DIAGNOSIS — C7951 Secondary malignant neoplasm of bone: Secondary | ICD-10-CM

## 2013-11-05 DIAGNOSIS — C7952 Secondary malignant neoplasm of bone marrow: Secondary | ICD-10-CM

## 2013-11-05 DIAGNOSIS — C50419 Malignant neoplasm of upper-outer quadrant of unspecified female breast: Secondary | ICD-10-CM

## 2013-11-05 LAB — CBC WITH DIFFERENTIAL/PLATELET
BASO%: 0.6 % (ref 0.0–2.0)
BASOS ABS: 0 10*3/uL (ref 0.0–0.1)
EOS ABS: 0.1 10*3/uL (ref 0.0–0.5)
EOS%: 1.9 % (ref 0.0–7.0)
HEMATOCRIT: 38.9 % (ref 34.8–46.6)
HEMOGLOBIN: 13.2 g/dL (ref 11.6–15.9)
LYMPH#: 1.7 10*3/uL (ref 0.9–3.3)
LYMPH%: 26.9 % (ref 14.0–49.7)
MCH: 32.1 pg (ref 25.1–34.0)
MCHC: 33.9 g/dL (ref 31.5–36.0)
MCV: 94.6 fL (ref 79.5–101.0)
MONO#: 0.6 10*3/uL (ref 0.1–0.9)
MONO%: 10 % (ref 0.0–14.0)
NEUT%: 60.6 % (ref 38.4–76.8)
NEUTROS ABS: 3.8 10*3/uL (ref 1.5–6.5)
Platelets: 222 10*3/uL (ref 145–400)
RBC: 4.11 10*6/uL (ref 3.70–5.45)
RDW: 13.7 % (ref 11.2–14.5)
WBC: 6.3 10*3/uL (ref 3.9–10.3)

## 2013-11-05 LAB — COMPREHENSIVE METABOLIC PANEL (CC13)
ALT: 19 U/L (ref 0–55)
AST: 22 U/L (ref 5–34)
Albumin: 4.1 g/dL (ref 3.5–5.0)
Alkaline Phosphatase: 89 U/L (ref 40–150)
Anion Gap: 12 mEq/L — ABNORMAL HIGH (ref 3–11)
BUN: 10.2 mg/dL (ref 7.0–26.0)
CALCIUM: 9.9 mg/dL (ref 8.4–10.4)
CHLORIDE: 105 meq/L (ref 98–109)
CO2: 25 mEq/L (ref 22–29)
Creatinine: 0.7 mg/dL (ref 0.6–1.1)
GLUCOSE: 108 mg/dL (ref 70–140)
Potassium: 3.9 mEq/L (ref 3.5–5.1)
Sodium: 142 mEq/L (ref 136–145)
Total Bilirubin: 0.24 mg/dL (ref 0.20–1.20)
Total Protein: 7.4 g/dL (ref 6.4–8.3)

## 2013-11-05 NOTE — Progress Notes (Signed)
Zometa held today per Micah Flesher. Patient complaining of increased jaw pain (left side greater than the right) and increased frequency. Patient also states her neck has been hurting for 3 weeks. Per Amy, patient will be scheduled for MRI and someone will call her to schedule it in the next few days. CMET drawn per Micah Flesher even though patient did not have zometa treatment. Patient agreeable to this. Cindi Carbon, RN

## 2013-11-06 ENCOUNTER — Other Ambulatory Visit: Payer: Self-pay | Admitting: *Deleted

## 2013-11-06 DIAGNOSIS — C7951 Secondary malignant neoplasm of bone: Principal | ICD-10-CM

## 2013-11-06 DIAGNOSIS — C50919 Malignant neoplasm of unspecified site of unspecified female breast: Secondary | ICD-10-CM

## 2013-11-07 ENCOUNTER — Telehealth: Payer: Self-pay | Admitting: *Deleted

## 2013-11-07 NOTE — Telephone Encounter (Signed)
Returned pt's call about scheduling her MRI. She would like it done at [facility] because she is claustrophobic and prefer an open MRI. Also pt said this would be more convenient for her. I told her to call [phone].Pt agreed. No further concerns.

## 2013-11-13 ENCOUNTER — Other Ambulatory Visit: Payer: Self-pay | Admitting: Physician Assistant

## 2013-11-15 ENCOUNTER — Other Ambulatory Visit (HOSPITAL_COMMUNITY): Payer: Medicare HMO

## 2013-11-16 ENCOUNTER — Ambulatory Visit
Admission: RE | Admit: 2013-11-16 | Discharge: 2013-11-16 | Disposition: A | Discharge status: Home / Self Care | Payer: Commercial Managed Care - HMO | Source: Ambulatory Visit | Attending: Oncology | Admitting: Oncology

## 2013-11-16 DIAGNOSIS — C7951 Secondary malignant neoplasm of bone: Principal | ICD-10-CM

## 2013-11-16 DIAGNOSIS — C50919 Malignant neoplasm of unspecified site of unspecified female breast: Secondary | ICD-10-CM

## 2013-11-16 MED ORDER — GADOBENATE DIMEGLUMINE 529 MG/ML IV SOLN
13.0000 mL | Freq: Once | INTRAVENOUS | Status: AC | PRN
  Administered 2013-11-16: 13 mL via INTRAVENOUS

## 2013-11-26 ENCOUNTER — Telehealth: Payer: Self-pay | Admitting: *Deleted

## 2013-11-26 NOTE — Telephone Encounter (Signed)
Pt called this RN stating ongoing pain " in my neck that is  not relieved for over a week ". " hurts all the time- night or day and I am having to take medication "  Per discussion Jamie [name] states she is using up to " 3 tylenol a day " ( not at one time but total of  3 ) " because I heard you shouldn't take more then 2 a day ".  She states the prescription for hydrocodone " causes me to have panic attacks "  Per inquiry of above- she develops heart palpatations when she takes the hydrocodone and feels very jittery and nervous.  Tylenol does lessen the pain and does not have other side effects.  This RN discussed with pt recent images of neck with known bone mets, with noted disc protrusions -   Per discussion this RN discussed appropriate use of tylenol with advice to to 2 tabs every 6 hours as needed presently.  MD would be made aware of above for further recommendation.

## 2013-11-27 ENCOUNTER — Other Ambulatory Visit: Payer: Self-pay | Admitting: Oncology

## 2013-11-27 DIAGNOSIS — G893 Neoplasm related pain (acute) (chronic): Secondary | ICD-10-CM

## 2013-11-28 ENCOUNTER — Telehealth: Payer: Self-pay | Admitting: *Deleted

## 2013-11-28 ENCOUNTER — Encounter: Payer: Self-pay | Admitting: Radiation Oncology

## 2013-11-28 ENCOUNTER — Ambulatory Visit
Admission: RE | Admit: 2013-11-28 | Discharge: 2013-11-28 | Disposition: A | Discharge status: Home / Self Care | Payer: Medicare HMO | Source: Ambulatory Visit | Attending: Radiation Oncology | Admitting: Radiation Oncology

## 2013-11-28 ENCOUNTER — Ambulatory Visit: Admission: RE | Admit: 2013-11-28 | Payer: Medicare HMO | Source: Ambulatory Visit | Admitting: Radiation Oncology

## 2013-11-28 VITALS — BP 128/65 | HR 67 | Temp 97.7°F | Resp 20 | Wt 152.2 lb

## 2013-11-28 DIAGNOSIS — M5020 Other cervical disc displacement, unspecified cervical region: Secondary | ICD-10-CM | POA: Insufficient documentation

## 2013-11-28 DIAGNOSIS — C7952 Secondary malignant neoplasm of bone marrow: Secondary | ICD-10-CM

## 2013-11-28 DIAGNOSIS — F411 Generalized anxiety disorder: Secondary | ICD-10-CM | POA: Insufficient documentation

## 2013-11-28 DIAGNOSIS — Z923 Personal history of irradiation: Secondary | ICD-10-CM | POA: Insufficient documentation

## 2013-11-28 DIAGNOSIS — M5030 Other cervical disc degeneration, unspecified cervical region: Secondary | ICD-10-CM | POA: Insufficient documentation

## 2013-11-28 DIAGNOSIS — C7951 Secondary malignant neoplasm of bone: Secondary | ICD-10-CM | POA: Insufficient documentation

## 2013-11-28 DIAGNOSIS — Z79899 Other long term (current) drug therapy: Secondary | ICD-10-CM | POA: Insufficient documentation

## 2013-11-28 DIAGNOSIS — C50919 Malignant neoplasm of unspecified site of unspecified female breast: Secondary | ICD-10-CM | POA: Insufficient documentation

## 2013-11-28 DIAGNOSIS — M25519 Pain in unspecified shoulder: Secondary | ICD-10-CM | POA: Insufficient documentation

## 2013-11-28 DIAGNOSIS — M542 Cervicalgia: Secondary | ICD-10-CM | POA: Insufficient documentation

## 2013-11-28 DIAGNOSIS — R51 Headache: Secondary | ICD-10-CM | POA: Insufficient documentation

## 2013-11-28 DIAGNOSIS — R6884 Jaw pain: Secondary | ICD-10-CM | POA: Insufficient documentation
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
## 2013-11-28 NOTE — Progress Notes (Addendum)
Histology and Location of Primary Cancer: Metastatic Breast To bone   Sites of Visceral and Bony Metastatic Disease: Blastic Metastases to C7,T1,and T2 Vetebral Bodies  Location(s) of Symptomatic Metastases: Lower back.  C/o increasing pain in her back and weakness of the lower extremities  Past/Anticipated chemotherapy by medical oncology, if any:  Capecitabine concurrently with Radiation Therapy.  Status post anastrozole and exemestane, both discontinued due to aches and pains. Status post tamoxifen between October 2007 until August 2011. Discontinued secondary to cramps.  Zoledronic acid starting 12/12/2007, currently given monthly, most recent dose 05/14/2013.  Letrozole since September 2014    Pain on a scale of 0-10 is: 8 If Spine Met(s), symptoms, if any, include: C/o increasing pain in her neck and head hurts more with movement of her head , hard nodule on left side of neck/throat area, pain starts left parietal side radiating down side of neck takes tylenol e.s. q6h prn with relief, very emotional toay ,tearful  Bowel/Bladder retention or incontinence (please describe):IBS, takes imodium prn for diarrhea, sometimes constipation  Numbness or weakness in extremities (please describe): C/o increasing pain in her back and weakness of the lower extremities  Ambulatory status? steady gait, ambulatory  SAFETY ISSUES: no  Prior radiation?yes Left breast , 12/07/05-01/20/06 left supraclavicular fossa& full axilla as well as left chest wall and upper internal mammary lymph nodes, 5040 cGy in 20-180 cGy fractions, Dr. Elba Barman.  Radiation to the lumbosacral spine and right hip to treat right hip pain thought to be associated with bony metastasis completed 05/10/2013 under the care of Dr. Pablo Ledger  Pacemaker/ICD? No  Possible current pregnancy? no  Is the patient on methotrexate? No   Current Complaints : other details:GXP2, HRT after hysterectomy till 09/2005 with breast bx  Anxiety,  depression,,panic attacks hx, Meniere's disease, asthma, hx adenomatous polyps,Diabetes Mellitus, Gerd, mitral valve prolapse, IBS, Ischemic colitis, Fibromyalgia Total abdominal hysterectomy B/L salpingo-opphorectomy, , D&C,Back surgery,snyovial cyst removed from spine, DDD,hx septoplasty x2,appy, b/l carpal tunnel release  Father died of a stroke age 51, mother died age 69 also stroke, 3 brothers, 1 died liver disease, 1 brother with prostate cancer, 3rd brother prostate and colon cancer, maternal grandmother breast cancer, no history of ovarian cancer in the family,

## 2013-11-29 ENCOUNTER — Ambulatory Visit: Payer: Medicare HMO | Admitting: Radiation Oncology

## 2013-11-29 ENCOUNTER — Ambulatory Visit: Payer: Medicare HMO

## 2013-11-30 ENCOUNTER — Encounter: Payer: Self-pay | Admitting: *Deleted

## 2013-11-30 NOTE — Progress Notes (Signed)
[facility] Work  Clinical Social Work was referred by Pension scheme manager for assessment of psychosocial needs as Pt is interested in additional counseling.  Clinical Social Worker contacted patient at home to offer support and assess for needs.  Pt is very interested in counseling and additional supports due to many stressors at home per her report. CSW and Pt plan to meet 12/13/13 prior to radonc appointment for further needs assessment. Pt is open to counseling and is considering further follow up with our counseling interns. CSW needs to first assess pt to determine she is appropriate for the interns. Pt plans to attend the Living with Cancer on that day as well.     Clinical Social Work interventions: Supportive Psychiatric nurse and referral.   Loren Racer, LCSW Clinical Social Worker [facility] for [place] Wednesday, Thursday and Friday Phone: [phone] Fax: [phone]

## 2013-12-03 ENCOUNTER — Other Ambulatory Visit (HOSPITAL_BASED_OUTPATIENT_CLINIC_OR_DEPARTMENT_OTHER): Payer: Commercial Managed Care - HMO

## 2013-12-03 ENCOUNTER — Ambulatory Visit (HOSPITAL_BASED_OUTPATIENT_CLINIC_OR_DEPARTMENT_OTHER): Payer: Commercial Managed Care - HMO

## 2013-12-03 ENCOUNTER — Other Ambulatory Visit: Payer: Medicare Other | Admitting: Lab

## 2013-12-03 VITALS — BP 153/64 | HR 69 | Temp 97.1°F | Resp 18

## 2013-12-03 DIAGNOSIS — C50419 Malignant neoplasm of upper-outer quadrant of unspecified female breast: Secondary | ICD-10-CM

## 2013-12-03 DIAGNOSIS — C7951 Secondary malignant neoplasm of bone: Secondary | ICD-10-CM

## 2013-12-03 DIAGNOSIS — C7952 Secondary malignant neoplasm of bone marrow: Secondary | ICD-10-CM

## 2013-12-03 DIAGNOSIS — Z853 Personal history of malignant neoplasm of breast: Secondary | ICD-10-CM

## 2013-12-03 LAB — COMPREHENSIVE METABOLIC PANEL (CC13)
ALBUMIN: 4.3 g/dL (ref 3.5–5.0)
ALT: 16 U/L (ref 0–55)
AST: 24 U/L (ref 5–34)
Alkaline Phosphatase: 85 U/L (ref 40–150)
Anion Gap: 11 mEq/L (ref 3–11)
BUN: 11.6 mg/dL (ref 7.0–26.0)
CO2: 27 mEq/L (ref 22–29)
Calcium: 10.1 mg/dL (ref 8.4–10.4)
Chloride: 107 mEq/L (ref 98–109)
Creatinine: 0.8 mg/dL (ref 0.6–1.1)
GLUCOSE: 85 mg/dL (ref 70–140)
POTASSIUM: 4.1 meq/L (ref 3.5–5.1)
Sodium: 145 mEq/L (ref 136–145)
TOTAL PROTEIN: 7.4 g/dL (ref 6.4–8.3)
Total Bilirubin: 0.25 mg/dL (ref 0.20–1.20)

## 2013-12-03 MED ORDER — SODIUM CHLORIDE 0.9 % IV SOLN
INTRAVENOUS | Status: DC
  Administered 2013-12-03: 11:00:00 via INTRAVENOUS

## 2013-12-03 MED ORDER — ZOLEDRONIC ACID 4 MG/100ML IV SOLN
4.0000 mg | Freq: Once | INTRAVENOUS | Status: AC
  Administered 2013-12-03: 4 mg via INTRAVENOUS
  Filled 2013-12-03: qty 100

## 2013-12-03 NOTE — Patient Instructions (Signed)

## 2013-12-13 ENCOUNTER — Ambulatory Visit: Admission: RE | Admit: 2013-12-13 | Payer: Medicare HMO | Source: Ambulatory Visit | Admitting: Radiation Oncology

## 2013-12-13 ENCOUNTER — Ambulatory Visit: Payer: Medicare HMO

## 2013-12-14 ENCOUNTER — Ambulatory Visit: Payer: Medicare HMO | Admitting: Radiation Oncology

## 2013-12-14 ENCOUNTER — Ambulatory Visit: Payer: Medicare HMO

## 2013-12-14 ENCOUNTER — Encounter: Payer: Self-pay | Admitting: *Deleted

## 2013-12-14 NOTE — Progress Notes (Signed)
[place] Work  Clinical Social Work had hoped to meet with Pt this week for closer assessment of concerns, but Pt unable to make her appointments. CSW phoned Pt to check in and left message that CSW will see her next week at her follow up to further assess concerns.    Loren Racer, LCSW Clinical Social Worker [facility] [place] Wednesday, Thursday and Friday Phone: [phone] Fax: [phone]

## 2013-12-18 ENCOUNTER — Other Ambulatory Visit: Payer: Self-pay | Admitting: Allergy and Immunology

## 2013-12-18 ENCOUNTER — Ambulatory Visit
Admission: RE | Admit: 2013-12-18 | Discharge: 2013-12-18 | Disposition: A | Discharge status: Home / Self Care | Payer: Commercial Managed Care - HMO | Source: Ambulatory Visit | Attending: Allergy and Immunology | Admitting: Allergy and Immunology

## 2013-12-18 DIAGNOSIS — R059 Cough, unspecified: Secondary | ICD-10-CM

## 2013-12-18 DIAGNOSIS — R05 Cough: Secondary | ICD-10-CM

## 2013-12-19 ENCOUNTER — Ambulatory Visit
Admission: RE | Admit: 2013-12-19 | Discharge: 2013-12-19 | Disposition: A | Discharge status: Home / Self Care | Payer: Medicare HMO | Source: Ambulatory Visit | Attending: Radiation Oncology | Admitting: Radiation Oncology

## 2013-12-19 VITALS — BP 141/50 | HR 76 | Temp 98.0°F | Wt 153.3 lb

## 2013-12-19 DIAGNOSIS — C50919 Malignant neoplasm of unspecified site of unspecified female breast: Secondary | ICD-10-CM | POA: Insufficient documentation

## 2013-12-19 DIAGNOSIS — C7951 Secondary malignant neoplasm of bone: Secondary | ICD-10-CM

## 2013-12-19 DIAGNOSIS — Z79899 Other long term (current) drug therapy: Secondary | ICD-10-CM | POA: Insufficient documentation

## 2013-12-19 NOTE — Progress Notes (Signed)
Please see the Nurse Progress Note in the MD Initial Consult Encounter for this patient. 

## 2013-12-19 NOTE — Progress Notes (Signed)
Patient is being seen in follow up to consultation to assess pain, discuss whether she has made decision on whether proceed with radiation.Patient had a chest x-ray on 12/18/2013 reveals no active lung disease and stable blastic bone metastasis.We will also have patient see social worker for one on one counseling.Neck, head and shoulder pain completely resolved.Has some arthritic pain of left knees.Patient doesn't want radiation at this time.

## 2013-12-20 ENCOUNTER — Encounter: Payer: Self-pay | Admitting: *Deleted

## 2013-12-20 NOTE — Progress Notes (Signed)
[facility] Clinical Social Work  Late entry due to [employer] issues. Clinical Social Work was referred by radiation oncologist due to issues with patient's support system and her current coping.  Clinical Social Worker met with patient in the exam room at length to assess psychosocial, emotional, mental health, and spiritual needs of the patient.  Patient's knowledge about cancer and its treatment including level of understanding, reactions, goals for care, and expectations:  Pt appears well aware as she has been a pt here since 2007. Pt is very involved and has a good understanding of her current situation. She reports to feel more physical symptoms when she is stressed at home. Currently, there have been many stressors at home.  Characteristics of the patient's support system: Pt has shared many stressors with her support system. Pt is married, but reports her relationship is very strained currently. She has a daughter that lives out of town and is not able to assist her due to her work. Pt reports her husband's children are supportive and nearby in [place]. They often help with meals, transportation and other concerns. Pt used to have a special needs son, who was her whole life and very special to her. He has since passed away. She reports her husband, Clair Gulling, provides little support or interaction. They both have many health issues that cause tremendous stress per her report. Her husband has severe back problems and much pain. She shared that Clair Gulling does not support her financially and they live together, but do not share their resources. This also causes much stress.   Patient and family psychosocial functioning including strengths, limitations, and coping skills: From CSW, assessment, pt appears to have several good friends that provide strong emotional support and assistance. This is a huge source of strength for "Ann". Her limited support from her husband is a huge source  of stress and a barrier to her coping abilities. CSW attempted to have pt name some coping mechanisms and she denied having any that worked for her. This appears to be an area that we could work to strengthen over time. Pt agrees she needs to come up with some ways to reduce her stress level.   Identifications of barriers to care: Pt appears to have transportation and good understanding of her care. Finances are a huge concern for her and her husband will not agree to provide his financial information to assist in applying for assistance/resources. Ongoing stress with her marriage is pt's largest concern and her most pressing issue. Pt is very open to further counseling and resources which will hopefully reduce these stressors and barriers over time.   Availability of community resources: Pt is very open to assistance, but must get proper documentation in order to apply for resources. She is warming up to the idea of additional supports and resources as well.   Clinical Social Worker follow up needed: yes  If yes, follow up plan: Pt agreeable to additional counseling sessions and CSW will meet with Pt on 01/10/14. CSW also making referral to chaplain to see Pt on 3/16 and Pt agreeable to this referral. Pt plans to try to apply for additional asst through financial advocate as well.  Loren Racer, LCSW Clinical Social Worker [facility] [place] Wednesday, Thursday and Friday Phone: [phone] Fax: [phone]

## 2013-12-21 ENCOUNTER — Telehealth: Payer: Self-pay | Admitting: Oncology

## 2013-12-21 NOTE — Telephone Encounter (Signed)
returned pt call....pt needed to r/s appt due to having appt same day...done....pt aware of new d.t

## 2013-12-21 NOTE — Progress Notes (Signed)
Department of Radiation Oncology  Phone:  [phone] Fax:        [phone]   Name: Jamie [name] MRN: [id number]  DOB: [birth year]  Date: 12/19/2013  Follow Up Visit Note  Diagnosis: Metastatic breast cancer  Summary and Interval since last radiation: 30 gray completed 05/11/2013 (radiation to the left breast and left supraclavicular fossa completed 2007)  Interval History: Jamie [name] presents today for routine followup.  Pain is completely resolved. She attributes this to decrease stress in her life. She has found it helpful to talk to social work. She is not interested in pursuing palliative radiation at this time.  Allergies:  Allergies  Allergen Reactions  . Hydrocodone Anxiety  . Oxycodone Anxiety  . Niacin Other (See Comments)    Dizziness   . Promethazine Hcl Other (See Comments)    Dizziness "FELT CRAZY"     Medications:  Current Outpatient Prescriptions  Medication Sig Dispense Refill  . acetaminophen (TYLENOL) 500 MG tablet Take 1,000 mg by mouth every 6 (six) hours as needed.      . beclomethasone (QVAR) 40 MCG/ACT inhaler Inhale 2 puffs into the lungs at bedtime.      Marland Kitchen BYSTOLIC 5 MG tablet Take 1 tablet by mouth Daily.      . CRESTOR 5 MG tablet Take 1 tablet by mouth Daily.      Marland Kitchen glimepiride (AMARYL) 1 MG tablet Take 0.5 tablets (0.5 mg total) by mouth if am blood sugar is > 125 and eating well.  Watch for lows  90 tablet  1  . hydrocortisone (ANUSOL-HC) 2.5 % rectal cream Place rectally daily as needed.      Marland Kitchen letrozole (FEMARA) 2.5 MG tablet Take 1 tablet (2.5 mg total) by mouth daily.  90 tablet  4  . lidocaine (XYLOCAINE) 5 % ointment Apply topically 4 (four) times daily as needed.  35.44 g  0  . lidocaine-hydrocortisone (ANAMANTEL HC) 3-0.5 % CREA Place 1 Applicatorful rectally 2 (two) times daily as needed for hemorrhoids and bleeding.  28.35 g  0  . meclizine (ANTIVERT) 25 MG tablet Take 1 tablet by mouth as needed. dizziness      . NEXIUM 40 MG  capsule Take 1 tablet by mouth Daily.      . ONGLYZA 5 MG TABS tablet Take 5 mg by mouth daily.       Marland Kitchen PROAIR HFA 108 (90 BASE) MCG/ACT inhaler Inhale 1-2 puffs into the lungs every 6 (six) hours as needed.       . clonazePAM (KLONOPIN) 0.5 MG tablet TAKE 1 TABLET BY MOUTH 3 TIMES A DAY AS NEEDED ANXIETY  90 tablet  0  . metoCLOPramide (REGLAN) 5 MG tablet        No current facility-administered medications for this encounter.    Physical Exam:  Filed Vitals:   12/19/13 1505  BP: 141/50  Pulse: 76  Temp: 98 F (36.7 C)  Weight: 153 lb 4.8 oz (69.536 kg)  SpO2: 97%   alert and oriented x3.   IMPRESSION: Jamie [name] is a 78 y.o. female status post palliative radiation to the lumbosacral area as well as a history of left supraclavicular and breast radiation completed in 2007 with resolved neck pain  PLAN: Her pain has resolved. I think it helped her to talk to social work. She is also getting involved in some of our survivorship classes. I will plan on seeing her back on an as-needed basis. She has followup with medical oncology in the  next few weeks. She knows contact me with any questions or concerns.    Jamie Silversmith, MD

## 2013-12-24 ENCOUNTER — Other Ambulatory Visit: Payer: Self-pay | Admitting: Physician Assistant

## 2013-12-24 DIAGNOSIS — C50919 Malignant neoplasm of unspecified site of unspecified female breast: Secondary | ICD-10-CM

## 2013-12-26 ENCOUNTER — Ambulatory Visit: Payer: Medicare HMO | Admitting: Endocrinology

## 2013-12-27 ENCOUNTER — Encounter: Payer: Self-pay | Admitting: Specialist

## 2013-12-27 NOTE — Progress Notes (Signed)
Spoke with patient by phone. Patient expressed desire for spiritual support since she has had a difficult time getting to church. Chaplain helped her explore her spiritual and emotional resources and needs and what sources of hope she has in her life. Offered support when she comes to the Center. Listening; affirmation; support.  Epifania Gore, Bonney [place], [facility], PhD

## 2013-12-31 ENCOUNTER — Ambulatory Visit: Payer: Medicare HMO

## 2013-12-31 ENCOUNTER — Ambulatory Visit: Payer: Medicare HMO | Admitting: Physician Assistant

## 2013-12-31 ENCOUNTER — Other Ambulatory Visit: Payer: Commercial Managed Care - HMO

## 2013-12-31 ENCOUNTER — Other Ambulatory Visit: Payer: Medicare Other

## 2013-12-31 ENCOUNTER — Other Ambulatory Visit: Payer: Medicare Other | Admitting: Lab

## 2014-01-01 ENCOUNTER — Emergency Department (HOSPITAL_COMMUNITY): Payer: Medicare HMO

## 2014-01-01 ENCOUNTER — Encounter (HOSPITAL_COMMUNITY): Payer: Self-pay | Admitting: Emergency Medicine

## 2014-01-01 ENCOUNTER — Emergency Department (HOSPITAL_COMMUNITY)
Admission: EM | Admit: 2014-01-01 | Discharge: 2014-01-01 | Disposition: A | Discharge status: Home / Self Care | Payer: Medicare HMO | Attending: Emergency Medicine | Admitting: Emergency Medicine

## 2014-01-01 DIAGNOSIS — R197 Diarrhea, unspecified: Secondary | ICD-10-CM | POA: Insufficient documentation

## 2014-01-01 DIAGNOSIS — Z8669 Personal history of other diseases of the nervous system and sense organs: Secondary | ICD-10-CM | POA: Insufficient documentation

## 2014-01-01 DIAGNOSIS — Z860100 Personal history of colon polyps, unspecified: Secondary | ICD-10-CM | POA: Insufficient documentation

## 2014-01-01 DIAGNOSIS — Z923 Personal history of irradiation: Secondary | ICD-10-CM | POA: Insufficient documentation

## 2014-01-01 DIAGNOSIS — Z853 Personal history of malignant neoplasm of breast: Secondary | ICD-10-CM | POA: Insufficient documentation

## 2014-01-01 DIAGNOSIS — IMO0002 Reserved for concepts with insufficient information to code with codable children: Secondary | ICD-10-CM | POA: Insufficient documentation

## 2014-01-01 DIAGNOSIS — Z9089 Acquired absence of other organs: Secondary | ICD-10-CM | POA: Insufficient documentation

## 2014-01-01 DIAGNOSIS — K219 Gastro-esophageal reflux disease without esophagitis: Secondary | ICD-10-CM | POA: Insufficient documentation

## 2014-01-01 DIAGNOSIS — F3289 Other specified depressive episodes: Secondary | ICD-10-CM | POA: Insufficient documentation

## 2014-01-01 DIAGNOSIS — F411 Generalized anxiety disorder: Secondary | ICD-10-CM | POA: Insufficient documentation

## 2014-01-01 DIAGNOSIS — Z79899 Other long term (current) drug therapy: Secondary | ICD-10-CM | POA: Insufficient documentation

## 2014-01-01 DIAGNOSIS — F329 Major depressive disorder, single episode, unspecified: Secondary | ICD-10-CM | POA: Insufficient documentation

## 2014-01-01 DIAGNOSIS — R1110 Vomiting, unspecified: Secondary | ICD-10-CM | POA: Insufficient documentation

## 2014-01-01 DIAGNOSIS — R42 Dizziness and giddiness: Secondary | ICD-10-CM | POA: Insufficient documentation

## 2014-01-01 DIAGNOSIS — Z9889 Other specified postprocedural states: Secondary | ICD-10-CM | POA: Insufficient documentation

## 2014-01-01 DIAGNOSIS — J45909 Unspecified asthma, uncomplicated: Secondary | ICD-10-CM | POA: Insufficient documentation

## 2014-01-01 DIAGNOSIS — Z90710 Acquired absence of both cervix and uterus: Secondary | ICD-10-CM | POA: Insufficient documentation

## 2014-01-01 DIAGNOSIS — E119 Type 2 diabetes mellitus without complications: Secondary | ICD-10-CM | POA: Insufficient documentation

## 2014-01-01 LAB — RAPID URINE DRUG SCREEN, HOSP PERFORMED
Amphetamines: NOT DETECTED
BARBITURATES: NOT DETECTED
Benzodiazepines: NOT DETECTED
COCAINE: NOT DETECTED
Opiates: NOT DETECTED
Tetrahydrocannabinol: NOT DETECTED

## 2014-01-01 LAB — COMPREHENSIVE METABOLIC PANEL
ALK PHOS: 74 U/L (ref 39–117)
ALT: 19 U/L (ref 0–35)
AST: 38 U/L — ABNORMAL HIGH (ref 0–37)
Albumin: 3.9 g/dL (ref 3.5–5.2)
BUN: 12 mg/dL (ref 6–23)
CO2: 26 meq/L (ref 19–32)
CREATININE: 0.46 mg/dL — AB (ref 0.50–1.10)
Calcium: 9.3 mg/dL (ref 8.4–10.5)
Chloride: 100 mEq/L (ref 96–112)
GFR calc Af Amer: >90 mL/min (ref >90)
Glucose, Bld: 180 mg/dL — ABNORMAL HIGH (ref 70–99)
POTASSIUM: 5.3 meq/L (ref 3.7–5.3)
Sodium: 140 mEq/L (ref 137–147)
Total Bilirubin: 0.3 mg/dL (ref 0.3–1.2)
Total Protein: 7.3 g/dL (ref 6.0–8.3)

## 2014-01-01 LAB — I-STAT CHEM 8, ED
BUN: 15 mg/dL (ref 6–23)
CALCIUM ION: 1.12 mmol/L — AB (ref 1.13–1.30)
CREATININE: 0.6 mg/dL (ref 0.50–1.10)
Chloride: 102 mEq/L (ref 96–112)
GLUCOSE: 173 mg/dL — AB (ref 70–99)
HCT: 39 % (ref 36.0–46.0)
Hemoglobin: 13.3 g/dL (ref 12.0–15.0)
Potassium: 5.2 mEq/L (ref 3.7–5.3)
Sodium: 140 mEq/L (ref 137–147)
TCO2: 28 mmol/L (ref 0–100)

## 2014-01-01 LAB — PROTIME-INR
INR: 0.91 (ref 0.00–1.49)
Prothrombin Time: 12.1 seconds (ref 11.6–15.2)

## 2014-01-01 LAB — CBC
HCT: 36 % (ref 36.0–46.0)
HEMOGLOBIN: 12.7 g/dL (ref 12.0–15.0)
MCH: 33.3 pg (ref 26.0–34.0)
MCHC: 35.3 g/dL (ref 30.0–36.0)
MCV: 94.5 fL (ref 78.0–100.0)
Platelets: 227 10*3/uL (ref 150–400)
RBC: 3.81 MIL/uL — ABNORMAL LOW (ref 3.87–5.11)
RDW: 13.4 % (ref 11.5–15.5)
WBC: 11.1 10*3/uL — AB (ref 4.0–10.5)

## 2014-01-01 LAB — URINALYSIS, ROUTINE W REFLEX MICROSCOPIC
BILIRUBIN URINE: NEGATIVE
Glucose, UA: 100 mg/dL — AB
Hgb urine dipstick: NEGATIVE
Ketones, ur: NEGATIVE mg/dL
LEUKOCYTES UA: NEGATIVE
Nitrite: NEGATIVE
PH: 7.5 (ref 5.0–8.0)
Protein, ur: NEGATIVE mg/dL
SPECIFIC GRAVITY, URINE: 1.019 (ref 1.005–1.030)
UROBILINOGEN UA: 0.2 mg/dL (ref 0.0–1.0)

## 2014-01-01 LAB — DIFFERENTIAL
Basophils Absolute: 0 10*3/uL (ref 0.0–0.1)
Basophils Relative: 0 % (ref 0–1)
Eosinophils Absolute: 0 10*3/uL (ref 0.0–0.7)
Eosinophils Relative: 0 % (ref 0–5)
Lymphocytes Relative: 12 % (ref 12–46)
Lymphs Abs: 1.3 10*3/uL (ref 0.7–4.0)
MONO ABS: 0.3 10*3/uL (ref 0.1–1.0)
MONOS PCT: 3 % (ref 3–12)
NEUTROS ABS: 9.4 10*3/uL — AB (ref 1.7–7.7)
NEUTROS PCT: 85 % — AB (ref 43–77)

## 2014-01-01 LAB — I-STAT TROPONIN, ED: Troponin i, poc: 0 ng/mL (ref 0.00–0.08)

## 2014-01-01 LAB — ETHANOL: Alcohol, Ethyl (B): <11 mg/dL (ref 0–11)

## 2014-01-01 LAB — APTT: APTT: 25 s (ref 24–37)

## 2014-01-01 MED ORDER — ONDANSETRON HCL 4 MG/2ML IJ SOLN
4.0000 mg | Freq: Once | INTRAMUSCULAR | Status: AC
  Administered 2014-01-01: 4 mg via INTRAVENOUS
  Filled 2014-01-01: qty 2

## 2014-01-01 MED ORDER — MECLIZINE HCL 25 MG PO TABS
25.0000 mg | ORAL_TABLET | Freq: Once | ORAL | Status: AC
  Administered 2014-01-01: 25 mg via ORAL
  Filled 2014-01-01: qty 1

## 2014-01-01 MED ORDER — LORAZEPAM 2 MG/ML IJ SOLN
1.0000 mg | Freq: Once | INTRAMUSCULAR | Status: AC
  Administered 2014-01-01: 1 mg via INTRAVENOUS
  Filled 2014-01-01: qty 1

## 2014-01-01 MED ORDER — MECLIZINE HCL 12.5 MG PO TABS: 25.0000 mg | ORAL_TABLET | Freq: Three times a day (TID) | ORAL | Status: DC | PRN

## 2014-01-01 MED ORDER — GADOBENATE DIMEGLUMINE 529 MG/ML IV SOLN
14.0000 mL | Freq: Once | INTRAVENOUS | Status: AC | PRN
  Administered 2014-01-01: 14 mL via INTRAVENOUS

## 2014-01-01 MED ORDER — SODIUM CHLORIDE 0.9 % IV BOLUS (SEPSIS)
1000.0000 mL | Freq: Once | INTRAVENOUS | Status: AC
  Administered 2014-01-01: 1000 mL via INTRAVENOUS

## 2014-01-01 MED ORDER — GADOBENATE DIMEGLUMINE 529 MG/ML IV SOLN: 14.0000 mL | Freq: Once | INTRAVENOUS | Status: DC | PRN

## 2014-01-01 NOTE — ED Notes (Signed)
Patient returned to room from CT. 

## 2014-01-01 NOTE — ED Notes (Signed)
Patient transported to CT 

## 2014-01-01 NOTE — Discharge Instructions (Signed)
Return to the ED with any concerns including vomiting and not able to keep down liquids, weakness of arms or legs, changes in vision or speech, chest pain, difficulty breathing, decreased level of alertness/lethargy, or any other alarming symptoms

## 2014-01-01 NOTE — ED Provider Notes (Signed)
CSN: [id number]     Arrival date & time 01/01/14  1534 History   First MD Initiated Contact with Patient 01/01/14 1544     Chief Complaint  Patient presents with  . Emesis  . Diarrhea     (Consider location/radiation/quality/duration/timing/severity/associated sxs/prior Treatment) HPI Pt presenting with c/o sensation of vertigo, associated with vomiting and diarrhea which began earlier this morning.  Pt states she was going to [employer] and when she arrived there she felt "drunk" like she couldn't keep her balance.  She states she has a hx of Meniere's disease and this feels similar to her bouts with that.  Upon arrival home she began to have vomiting and diarrhea- approx 4 episodes of each.  Nonbloody and nonbilious.  No abdominal pain.    Past Medical History  Diagnosis Date  . Anxiety   . Asthma   . Breast cancer     metastasized  . Depression   . Diabetes mellitus   . GERD (gastroesophageal reflux disease)   . Hyperlipidemia   . Internal hemorrhoids   . Hx of adenomatous colonic polyps   . IBS (irritable bowel syndrome)   . Mitral valve prolapse   . Ischemic colitis   . Fibromyalgia   . Allergy   . Cataract   . S/P radiation therapy within four to twelve weeks 04/26/13-05/11/13    L-spine/sacrum 30Gy/12fx   Past Surgical History  Procedure Laterality Date  . Mastectomy Left     then treated with chemo and radiation  . Total abdominal hysterectomy    . Back surgery      synovial cyst removed from spine  . Carpal tunnel release Bilateral   . Dilation and curettage of uterus    . Appendectomy    . Cataract surgery Bilateral    Family History  Problem Relation Age of Onset  . Prostate cancer Brother     x2  . Irritable bowel syndrome Mother   . Colon cancer Neg Hx   . Stroke Father    History  Substance Use Topics  . Smoking status: Never Smoker   . Smokeless tobacco: Never Used  . Alcohol Use: No   OB History   Grav Para Term Preterm Abortions TAB SAB Ect  Mult Living                 Review of Systems ROS reviewed and all otherwise negative except for mentioned in HPI    Allergies  Hydrocodone; Oxycodone; Niacin; and Promethazine hcl  Home Medications   Current Outpatient Rx  Name  Route  Sig  Dispense  Refill  . acetaminophen (TYLENOL) 500 MG tablet   Oral   Take 500-1,000 mg by mouth every 6 (six) hours as needed for mild pain or moderate pain.          . beclomethasone (QVAR) 40 MCG/ACT inhaler   Inhalation   Inhale 2 puffs into the lungs 2 (two) times daily.          Marland Kitchen BYSTOLIC 5 MG tablet   Oral   Take 1 tablet by mouth Daily.         . cefUROXime (CEFTIN) 250 MG tablet   Oral   Take 250 mg by mouth 2 (two) times daily. For 10 days, dispensed on 12/25/13         . Cholecalciferol (VITAMIN D) 1000 UNITS capsule   Oral   Take 1,000-2,000 Units by mouth 2 (two) times daily. 2 in am, 1 at night         .  clonazePAM (KLONOPIN) 0.5 MG tablet   Oral   Take 0.5 mg by mouth 2 (two) times daily as needed for anxiety.         . CRESTOR 5 MG tablet   Oral   Take 1 tablet by mouth Daily.         Marland Kitchen glimepiride (AMARYL) 1 MG tablet   Oral   Take 1 mg by mouth at bedtime.         . hydrocortisone (ANUSOL-HC) 2.5 % rectal cream   Rectal   Place rectally daily as needed.         Marland Kitchen letrozole (FEMARA) 2.5 MG tablet   Oral   Take 1 tablet (2.5 mg total) by mouth daily.   90 tablet   4   . lidocaine-hydrocortisone (ANAMANTEL HC) 3-0.5 % CREA      Place 1 Applicatorful rectally 2 (two) times daily as needed for hemorrhoids and bleeding.   28.35 g   0   . meclizine (ANTIVERT) 25 MG tablet   Oral   Take 25 mg by mouth as needed for dizziness.          . mupirocin ointment (BACTROBAN) 2 %   Nasal   Place 1 application into the nose daily.          Marland Kitchen NEXIUM 40 MG capsule   Oral   Take 1 tablet by mouth Daily.         . ONGLYZA 5 MG TABS tablet   Oral   Take 5 mg by mouth daily.           Vladimir Faster Glycol-Propyl Glycol (SYSTANE OP)   Ophthalmic   Apply 1-2 drops to eye as needed (dry eyes).         Marland Kitchen PROAIR HFA 108 (90 BASE) MCG/ACT inhaler   Inhalation   Inhale 1-2 puffs into the lungs every 6 (six) hours as needed.          . meclizine (ANTIVERT) 12.5 MG tablet   Oral   Take 2 tablets (25 mg total) by mouth 3 (three) times daily as needed for dizziness.   30 tablet   0    BP 151/62  Pulse 75  Temp(Src) 97.7 F (36.5 C) (Axillary)  Resp 18  SpO2 96% Vitals reviewed Physical Exam Physical Examination: General appearance - alert, well appearing, and in no distress Mental status - alert, oriented to person, place, and time Eyes - pupils equal and reactive, extraocular eye movements intact Mouth - mucous membranes moist, pharynx normal without lesions Chest - clear to auscultation, no wheezes, rales or rhonchi, symmetric air entry Heart - normal rate, regular rhythm, normal S1, S2, no murmurs, rubs, clicks or gallops Abdomen - soft, nontender, nondistended, no masses or organomegaly Neurological - alert, oriented x 3, cranial nerves 2-12 tested and intact, strength 5/5 in extremities  X 4, sensation intact, negative rhomberg, vertigo is reproduced with turning head side to side Extremities - peripheral pulses normal, no pedal edema, no clubbing or cyanosis Skin - normal coloration and turgor, no rashes  ED Course  Procedures (including critical care time)  7:48 PM pt states she feels  Labs Review Labs Reviewed  CBC - Abnormal; Notable for the following:    WBC 11.1 (*)    RBC 3.81 (*)    All other components within normal limits  DIFFERENTIAL - Abnormal; Notable for the following:    Neutrophils Relative % 85 (*)    Neutro Abs 9.4 (*)  All other components within normal limits  COMPREHENSIVE METABOLIC PANEL - Abnormal; Notable for the following:    Glucose, Bld 180 (*)    Creatinine, Ser 0.46 (*)    AST 38 (*)    All other components within  normal limits  URINALYSIS, ROUTINE W REFLEX MICROSCOPIC - Abnormal; Notable for the following:    Glucose, UA 100 (*)    All other components within normal limits  I-STAT CHEM 8, ED - Abnormal; Notable for the following:    Glucose, Bld 173 (*)    Calcium, Ion 1.12 (*)    All other components within normal limits  ETHANOL  PROTIME-INR  APTT  URINE RAPID DRUG SCREEN (HOSP PERFORMED)  I-STAT TROPOININ, ED  I-STAT TROPOININ, ED   Imaging Review Ct Head Wo Contrast  01/01/2014   CLINICAL DATA:  Sudden onset of vertigo ; history of breast cancer metastatic to bone  EXAM: CT HEAD WITHOUT CONTRAST  TECHNIQUE: Contiguous axial images were obtained from the base of the skull through the vertex without intravenous contrast.  COMPARISON:  Cervical spine MRI 11/16/2013 ; prior CT scan of the head 06/01/2011  FINDINGS: Negative for acute intracranial hemorrhage, acute infarction, mass, mass effect, hydrocephalus or midline shift. Gray-white differentiation is preserved throughout. Prominent sulcus noted in the left frontal lobe. This is unchanged compared to prior imaging. Mild cerebral and cerebellar volume loss. Stable appearance of sclerotic metastasis in the left occipital condyle. There is a new 5 mm lucency in the right occipital condyle concerning for a new focus of metastatic disease. This finding was not clearly seen on the prior CT scan of the head from 04/01/2011. No definite abnormality involving either petrous temporal bone to suggest an etiology for the patient's new onset vertigo. The mastoid air cells are normally aerated. Bilateral internal auditory canals and visualized portions of the cochlea are intact. Atherosclerotic calcifications are noted throughout both cavernous internal carotid arteries.  IMPRESSION: 1. No acute intracranial abnormality. 2. Query a new 5 mm lucency in the right occipital condyle concerning for progression of osseous metastatic disease. 3. Stable sclerotic and  therefore likely treated metastasis in the left occipital condyle.   Electronically Signed   By: Jacqulynn Cadet [name].   On: 01/01/2014 16:30   Mr Jeri Cos RC Contrast  01/01/2014   CLINICAL DATA:  Vomiting and diarrhea. Vertigo. Metastatic breast cancer  EXAM: MRI HEAD WITHOUT AND WITH CONTRAST  TECHNIQUE: Multiplanar, multiecho pulse sequences of the brain and surrounding structures were obtained without and with intravenous contrast.  CONTRAST:  73mL MULTIHANCE GADOBENATE DIMEGLUMINE 529 MG/ML IV SOLN  COMPARISON:  CT head 01/01/2014  FINDINGS: Ventricle size is normal. Negative for acute infarct. Pituitary normal in size. Craniocervical junction is normal. Several small hyperintensities are present consistent with mild chronic microvascular ischemia, less than expected for age.  Negative for hemorrhage or mass lesion. No shift of the midline structures.  Postcontrast imaging reveals normal enhancement. No enhancing mass. Leptomeningeal enhancement is normal.  IMPRESSION: Normal for age MRI of  the brain with contrast.   Electronically Signed   By: Franchot Gallo [name].   On: 01/01/2014 19:29     EKG Interpretation   Date/Time:  Tuesday January 01 2014 16:12:13 EDT Ventricular Rate:  96 PR Interval:  207 QRS Duration: 76 QT Interval:  399 QTC Calculation: 504 R Axis:   33 Text Interpretation:  Sinus rhythm Borderline prolonged PR interval  Prolonged QT interval Since previous tracing QT is longer Confirmed by  Canary Brim  MD, [place] [phone]) on 01/01/2014 8:48:34 PM      MDM   Final diagnoses:  Vertigo  Vomiting and diarrhea    Pt presenting with acute onset of vomiting and diarrhea with vertigo.  No focal weakness.  Hx of Meniere's disease.  Head CT and MRI brain reassuring.  Symptoms are also positional- doubt posterior stroke given these findings.  Pt feels much improved after meds and is tolerating po trial in the ED.  Discharged with rx for meclizine.  Discharged with strict return  precautions.  Pt agreeable with plan.    Threasa Beards, MD 01/01/14 2049

## 2014-01-01 NOTE — ED Notes (Signed)
Per EMS: Pt is from home. Pt reports N/V/D that began this morning at 0800. Pt also reports dizziness. Pt is A/O x4 and in NAD.

## 2014-01-01 NOTE — ED Notes (Signed)
Patient transported to MRI 

## 2014-01-01 NOTE — ED Notes (Signed)
Patient transported from MRI to room

## 2014-01-02 ENCOUNTER — Ambulatory Visit: Payer: Medicare HMO | Admitting: Endocrinology

## 2014-01-03 ENCOUNTER — Other Ambulatory Visit: Payer: Self-pay | Admitting: *Deleted

## 2014-01-03 DIAGNOSIS — C50919 Malignant neoplasm of unspecified site of unspecified female breast: Secondary | ICD-10-CM

## 2014-01-03 DIAGNOSIS — C7951 Secondary malignant neoplasm of bone: Principal | ICD-10-CM

## 2014-01-04 ENCOUNTER — Ambulatory Visit (HOSPITAL_BASED_OUTPATIENT_CLINIC_OR_DEPARTMENT_OTHER): Payer: Commercial Managed Care - HMO | Admitting: Physician Assistant

## 2014-01-04 ENCOUNTER — Telehealth: Payer: Self-pay | Admitting: Physician Assistant

## 2014-01-04 ENCOUNTER — Other Ambulatory Visit (HOSPITAL_BASED_OUTPATIENT_CLINIC_OR_DEPARTMENT_OTHER): Payer: Commercial Managed Care - HMO

## 2014-01-04 ENCOUNTER — Encounter: Payer: Self-pay | Admitting: Physician Assistant

## 2014-01-04 ENCOUNTER — Ambulatory Visit: Payer: Commercial Managed Care - HMO

## 2014-01-04 ENCOUNTER — Telehealth: Payer: Self-pay | Admitting: *Deleted

## 2014-01-04 VITALS — BP 127/53 | HR 80 | Temp 97.9°F | Resp 18 | Ht 64.0 in | Wt 152.1 lb

## 2014-01-04 DIAGNOSIS — F341 Dysthymic disorder: Secondary | ICD-10-CM

## 2014-01-04 DIAGNOSIS — M545 Low back pain, unspecified: Secondary | ICD-10-CM

## 2014-01-04 DIAGNOSIS — C7951 Secondary malignant neoplasm of bone: Principal | ICD-10-CM

## 2014-01-04 DIAGNOSIS — C50919 Malignant neoplasm of unspecified site of unspecified female breast: Secondary | ICD-10-CM

## 2014-01-04 DIAGNOSIS — E119 Type 2 diabetes mellitus without complications: Secondary | ICD-10-CM

## 2014-01-04 DIAGNOSIS — F3289 Other specified depressive episodes: Secondary | ICD-10-CM

## 2014-01-04 DIAGNOSIS — F329 Major depressive disorder, single episode, unspecified: Secondary | ICD-10-CM

## 2014-01-04 DIAGNOSIS — C7952 Secondary malignant neoplasm of bone marrow: Secondary | ICD-10-CM

## 2014-01-04 DIAGNOSIS — F411 Generalized anxiety disorder: Secondary | ICD-10-CM

## 2014-01-04 DIAGNOSIS — M25559 Pain in unspecified hip: Secondary | ICD-10-CM

## 2014-01-04 DIAGNOSIS — M25551 Pain in right hip: Secondary | ICD-10-CM

## 2014-01-04 DIAGNOSIS — C50419 Malignant neoplasm of upper-outer quadrant of unspecified female breast: Secondary | ICD-10-CM

## 2014-01-04 LAB — COMPREHENSIVE METABOLIC PANEL (CC13)
ALBUMIN: 3.9 g/dL (ref 3.5–5.0)
ALT: 18 U/L (ref 0–55)
ANION GAP: 13 meq/L — AB (ref 3–11)
AST: 22 U/L (ref 5–34)
Alkaline Phosphatase: 78 U/L (ref 40–150)
BUN: 12 mg/dL (ref 7.0–26.0)
CALCIUM: 9.9 mg/dL (ref 8.4–10.4)
CHLORIDE: 105 meq/L (ref 98–109)
CO2: 24 meq/L (ref 22–29)
Creatinine: 0.7 mg/dL (ref 0.6–1.1)
Glucose: 160 mg/dl — ABNORMAL HIGH (ref 70–140)
Potassium: 3.8 mEq/L (ref 3.5–5.1)
Sodium: 142 mEq/L (ref 136–145)
Total Bilirubin: 0.26 mg/dL (ref 0.20–1.20)
Total Protein: 7 g/dL (ref 6.4–8.3)

## 2014-01-04 LAB — CBC WITH DIFFERENTIAL/PLATELET
BASO%: 0.6 % (ref 0.0–2.0)
BASOS ABS: 0 10*3/uL (ref 0.0–0.1)
EOS%: 1.6 % (ref 0.0–7.0)
Eosinophils Absolute: 0.1 10*3/uL (ref 0.0–0.5)
HCT: 38.4 % (ref 34.8–46.6)
HEMOGLOBIN: 13.1 g/dL (ref 11.6–15.9)
LYMPH#: 1.3 10*3/uL (ref 0.9–3.3)
LYMPH%: 28.3 % (ref 14.0–49.7)
MCH: 33.1 pg (ref 25.1–34.0)
MCHC: 34.2 g/dL (ref 31.5–36.0)
MCV: 96.9 fL (ref 79.5–101.0)
MONO#: 0.4 10*3/uL (ref 0.1–0.9)
MONO%: 8.5 % (ref 0.0–14.0)
NEUT%: 61 % (ref 38.4–76.8)
NEUTROS ABS: 2.9 10*3/uL (ref 1.5–6.5)
Platelets: 225 10*3/uL (ref 145–400)
RBC: 3.96 10*6/uL (ref 3.70–5.45)
RDW: 13.8 % (ref 11.2–14.5)
WBC: 4.7 10*3/uL (ref 3.9–10.3)

## 2014-01-04 MED ORDER — ZOLEDRONIC ACID 4 MG/100ML IV SOLN
4.0000 mg | Freq: Once | INTRAVENOUS | Status: DC
  Filled 2014-01-04: qty 100

## 2014-01-04 MED ORDER — SODIUM CHLORIDE 0.9 % IV SOLN
Freq: Once | INTRAVENOUS | Status: AC
  Administered 2014-01-04: 11:00:00 via INTRAVENOUS

## 2014-01-04 NOTE — Progress Notes (Signed)
ID: [name]   DOB: May 24, 1936  MR#: [id number]  CSN#:[id number]  PCP: [name], MD GYN: Everlene Farrier, MD SU: OTHER MD: Renato Shin, MD;  Delfin Edis, MD  CHIEF COMPLAINT:  Metastatic Breast Cancer   HISTORY OF PRESENT ILLNESS: The patient's cancer was uncovered by an unusually circuitous route. She presented with stomach upset.  Because of a history of colitis, Dr. Teena Irani who happened to be on call for Dr. Cristina Gong, obtained CT of the abdomen and pelvis on August 30, 2005.  As far as the abdomen and pelvis were concerned, there was no evidence of colitis but there were multiple scattered sclerotic lesions in the bone suggesting possible sclerotic metastatic disease (versus osteopoikilosis.)  Accordingly a bone scan was obtained.  This was done on September 01, 2005, and found tiny sclerotic lesions, and in particular a lesion of concern in the sternum.  Accordingly a CT scan of the chest was obtained.  This found the sternal problem to correspond with degenerative changes. However, it also showed a soft tissue nodule in the left breast.  The patient then had mammograms on September 27, 2005.  This found two areas of spiculation and architectural distortion in the upper outer quadrant of the left breast.  By ultrasound these were hyperechoic and irregular, highly suspicious for carcinoma.  Biopsy was obtained the same day and showed ([id number], as well as [phone]) invasive lobular carcinoma, with both lesions biopsied being ER and PR positive, both herceptest positive, both negative by FISH.  Accordingly the patient was referred to Dr. Georgette Dover and breast MRI was obtained October 06, 2006.  No abnormal enhancement was noted in the right breast.  There was enhancing nodularity in the upper portion of the left breast where several discrete nodules were seen.  Accordingly Dr. Georgette Dover performed a left modified radical mastectomy on November 02, 2005.  The patient had a positive sentinel  lymph node and Dr. Georgette Dover performed a left axillary lymph node dissection.  However, only three additional lymph nodes were recovered, two of which also showed metastatic involvement (all had extra capsular extension.)  The patient's subsequent history is as detailed below  INTERVAL HISTORY: Jamie [name] returns alone today for followup of her metastatic breast carcinoma. She continues on the letrozole and zoledronic acid, zoledronic acid given monthly, and due again today. She's tolerating both agents well. Specifically, she has only occasional hot flashes and some mild vaginal dryness. She has back pain associated with her metastatic disease, but denies any increased arthralgias associated with the letrozole. She's had no jaw pain and denies any recent dental procedures or extractions.  Interval history is notable for Jamie [name] having been taken to the hospital by ambulance earlier this week. She tells me she "felt terrible and week" and could "hardly walk". She called 911, and when the ambulance arrived she says her blood sugar was close to 500. She was taken to the emergency room and her blood sugars improved. They are still a little elevated today here in our office at 160 (nonfasting).   She also has a history of Mnire's disease which was causing increased nausea.   I will mention that Jamie [name] had an appointment with Dr. Loanne Drilling scheduled  for earlier this week, but that happened to be when she was so sick, and she had to cancel that appointment. She plans to reschedule as soon as possible so he might followup on her poorly controlled diabetes.  REVIEW OF SYSTEMS: Jamie [name] is feeling "okay" today.  Her biggest complaints continue to be her anxiety and depression which she tells me she has "had all her life". We did spend quite a bit of time discussing this today. She has also been in contact with our Child psychotherapist and our chaplain over the past month.  Physically, Jamie [name] has had no illnesses and denies  any fevers. She's had no rashes or skin changes and denies any abnormal bruising or bleeding. She's eating and drinking fairly well and has no nausea, emesis, or recent change in bowel or bladder habits. She's had no cough, increased shortness of breath, chest pain, or palpitations. She denies any abnormal headaches or dizziness. She currently denies any pain other than some mild hip pain. She tells me she can "tell when it is time for her infusion" because the pain begins to increase. She's had no peripheral swelling.  A detailed review of systems is otherwise stable and noncontributory.   PAST MEDICAL HISTORY: Past Medical History  Diagnosis Date  . Anxiety   . Asthma   . Breast cancer     metastasized  . Depression   . Diabetes mellitus   . GERD (gastroesophageal reflux disease)   . Hyperlipidemia   . Internal hemorrhoids   . Hx of adenomatous colonic polyps   . IBS (irritable bowel syndrome)   . Mitral valve prolapse   . Ischemic colitis   . Fibromyalgia   . Allergy   . Cataract   . S/P radiation therapy within four to twelve weeks 04/26/13-05/11/13    L-spine/sacrum 30Gy/6fx  Significant for diabetes, hypercholesterolemia, asthma, mitral valve prolapse requiring antibiotic prophylaxis before any invasive procedures, Meniere's disease, gastroesophageal reflux disease, osteoarthritis, degenerative disk disease, history of fibromyalgia, panic attacks, history of synovial cyst removal, history of total hysterectomy with bilateral salpingo-oophorectomy, history of septoplasty x2, status post appendectomy, status post dilatation and curettage remotely, status post bilateral carpal tunnel release under Dr. Rosanne Ashing [place].  PAST SURGICAL HISTORY: Past Surgical History  Procedure Laterality Date  . Mastectomy Left     then treated with chemo and radiation  . Total abdominal hysterectomy    . Back surgery      synovial cyst removed from spine  . Carpal tunnel release Bilateral   .  Dilation and curettage of uterus    . Appendectomy    . Cataract surgery Bilateral     FAMILY HISTORY The patient's father died at the age of 38 from a stroke.  The patient's mother died at the age of 50, also from a stroke.  The patient has three brothers; one died with "liver disease."  One has prostate cancer.  The other brother has prostate and colon cancer.  The only breast cancer in the family was the patient's mother's mother and she does not know how old her grandmother was when she was diagnosed.  There is no history of ovarian cancer in the family.  GYNECOLOGIC HISTORY: She is GXP2.  She had her hysterectomy while still menstruating and she took hormone replacement until December 2006, when she had her breast biopsy.  SOCIAL HISTORY: (Updated 03-08-2014) She used to work as a Lawyer.  Her husband [name] (who is the son of my former patient Cecil Bixby) is semiretired, working for a Financial risk analyst.  What he likes to do is hunt and fish.  Their son Iantha Fallen had significant muscular dystrophy and died in 03/09/07. Their daughter Marylu Lund lives in [place] and works in an office.  The patient has two granddaughters, both RNs,  one working at [employer] and the other in [place].    ADVANCED DIRECTIVES:  HEALTH MAINTENANCE:  (Updated 01/04/2014) History  Substance Use Topics  . Smoking status: Never Smoker   . Smokeless tobacco: Never Used  . Alcohol Use: No     Colonoscopy:  July 2013, Dr. Olevia Perches  PAP: s/p hysterectomy  Bone density:  November 2014 at [facility], normal  Lipid panel: Dr. Virgina Jock    Allergies  Allergen Reactions  . Hydrocodone Anxiety    Pt. Said she is fine with this medication  . Oxycodone Anxiety  . Niacin Other (See Comments)    Dizziness   . Promethazine Hcl Other (See Comments)    Dizziness "FELT CRAZY"     Current Outpatient Prescriptions  Medication Sig Dispense Refill  . beclomethasone (QVAR) 40 MCG/ACT inhaler Inhale 2 puffs into the  lungs 2 (two) times daily.       Marland Kitchen BYSTOLIC 5 MG tablet Take 1 tablet by mouth Daily.      . cefUROXime (CEFTIN) 250 MG tablet Take 250 mg by mouth 2 (two) times daily. For 10 days, dispensed on 12/25/13      . Cholecalciferol (VITAMIN D) 1000 UNITS capsule Take 1,000-2,000 Units by mouth 2 (two) times daily. 2 in am, 1 at night      . clonazePAM (KLONOPIN) 0.5 MG tablet Take 0.5 mg by mouth 2 (two) times daily as needed for anxiety.      . CRESTOR 5 MG tablet Take 1 tablet by mouth Daily.      Marland Kitchen glimepiride (AMARYL) 1 MG tablet Take 1 mg by mouth at bedtime.      . hydrocortisone (ANUSOL-HC) 2.5 % rectal cream Place rectally daily as needed.      Marland Kitchen letrozole (FEMARA) 2.5 MG tablet Take 1 tablet (2.5 mg total) by mouth daily.  90 tablet  4  . lidocaine-hydrocortisone (ANAMANTEL HC) 3-0.5 % CREA Place 1 Applicatorful rectally 2 (two) times daily as needed for hemorrhoids and bleeding.  28.35 g  0  . meclizine (ANTIVERT) 12.5 MG tablet Take 2 tablets (25 mg total) by mouth 3 (three) times daily as needed for dizziness.  30 tablet  0  . meclizine (ANTIVERT) 25 MG tablet Take 25 mg by mouth as needed for dizziness.       . mupirocin ointment (BACTROBAN) 2 % Place 1 application into the nose daily.       Marland Kitchen NEXIUM 40 MG capsule Take 1 tablet by mouth Daily.      . ONGLYZA 5 MG TABS tablet Take 5 mg by mouth daily.       Vladimir Faster Glycol-Propyl Glycol (SYSTANE OP) Apply 1-2 drops to eye as needed (dry eyes).      Marland Kitchen PROAIR HFA 108 (90 BASE) MCG/ACT inhaler Inhale 1-2 puffs into the lungs every 6 (six) hours as needed.       Marland Kitchen acetaminophen (TYLENOL) 500 MG tablet Take 500-1,000 mg by mouth every 6 (six) hours as needed for mild pain or moderate pain.        No current facility-administered medications for this visit.    OBJECTIVE: Middle-aged white [name] who appears anxious but is in no acute distress. Filed Vitals:   01/04/14 0859  BP: 127/53  Pulse: 80  Temp: 97.9 F (36.6 C)  Resp: 18      Body mass index is 26.1 kg/(m^2).    ECOG FS: 1 Filed Weights   01/04/14 [id number]  Weight: 152 lb 1.6 oz (68.992 kg)   Physical Exam: HEENT:  Sclerae anicteric.  Oropharynx clear and moist. No ulcerations or oropharyngeal candidiasis. Neck supple, trachea midline. No thyromegaly.  NODES:  No cervical or supraclavicular lymphadenopathy palpated.  BREAST EXAM: Right breast is unremarkable. Patient is status post left mastectomy. Telangiectasias noted status post radiation therapy, but no suspicious skin changes and no nodularities. No evidence of local recurrence. Axillae are benign bilaterally, no palpable lymphadenopathy. LUNGS:  Clear to auscultation bilaterally with good excursion..  No wheezes or rhonchi HEART:  Regular rate and rhythm. Soft systolic murmur appreciated. ABDOMEN:  Soft, nontender.  No hepatomegaly. Positive bowel sounds.  MSK:  No focal spinal tenderness to palpation. Good range of motion bilaterally in the upper extremities. EXTREMITIES:  No peripheral edema.  No lymphedema in the left upper extremity. SKIN:  Benign with no visible rashes or skin changes. No excessive ecchymoses. No petechiae. No pallor. NEURO:  Nonfocal. Well oriented.  Anxious affect.      LAB RESULTS: Lab Results  Component Value Date   WBC 4.7 01/04/2014   NEUTROABS 2.9 01/04/2014   HGB 13.1 01/04/2014   HCT 38.4 01/04/2014   MCV 96.9 01/04/2014   PLT 225 01/04/2014      Chemistry      Component Value Date/Time   NA 142 01/04/2014 0844   NA 140 01/01/2014 1617   K 3.8 01/04/2014 0844   K 5.2 01/01/2014 1617   CL 102 01/01/2014 1617   CL 103 03/22/2013 0754   CO2 24 01/04/2014 0844   CO2 26 01/01/2014 1610   BUN 12.0 01/04/2014 0844   BUN 15 01/01/2014 1617   CREATININE 0.7 01/04/2014 0844   CREATININE 0.60 01/01/2014 1617      Component Value Date/Time   CALCIUM 9.9 01/04/2014 0844   CALCIUM 9.3 01/01/2014 1610   ALKPHOS 78 01/04/2014 0844   ALKPHOS 74 01/01/2014 1610   AST 22 01/04/2014 0844   AST  38* 01/01/2014 1610   ALT 18 01/04/2014 0844   ALT 19 01/01/2014 1610   BILITOT 0.26 01/04/2014 0844   BILITOT 0.3 01/01/2014 1610       STUDIES:  Most recent bone density at [facility] 08/21/2013 was normal.   Most recent right screening mammogram was 06/25/2013 and was unremarkable.  Dg Mandible 4 Views 09/26/2013   CLINICAL DATA:  The patient has known metastatic breast malignancy to bone. The patient is now complaining of mandibular pain left greater than right.  EXAM: MANDIBLE - 4+ VIEW  COMPARISON:  Nuclear bone scan dated July 10, 2013  FINDINGS: The mandibular body and rami appear adequately mineralized. There is no definite lytic or blastic lesion or periosteal reaction. As best as can be determined the mandibular condyles are intact.  IMPRESSION: There is no plain film evidence of metastatic disease to the mandible.   Electronically Signed   By: David  Martinique   On: 09/26/2013 10:55      ASSESSMENT:77 y.o.  Jamie [name]    (1) status post left modified radical mastectomy in January 2007 for a T1c N1, stage IIA  invasive lobular carcinoma, grade 1, strongly estrogen and progesterone receptor positive, HER-2 negative, with an MIB-1 of 7%.  (2)  Status post radiation given concurrently with Capecitabine.  Declined chemotherapy.    (3) Status post anastrozole and exemestane, both discontinued due to aches and pains.  Status post tamoxifen between October 2007 until August 2011, discontinued secondary to cramps   (  4) multiple sclerotic bony lesions noted at initial diagnosis, biopsied x2 August 2009 without malignancy documented, on zoledronic acid annually starting 12/12/2007. Increased to monthly starting 07/17/2013  (5) On fulvestrant between September 2011 and 05/17/2013 with likely progression of bony disease  (6)  radiation to the lumbosacral spine and right hip to treat right hip pain to 30 gray completed 51/07/2110, complicated by [possible]  radiation induced colitis  (7)  on letrozole since September 2014.  (8) receiving zoledronic acid monthly basis beginning September 2014, the plan being to continue treating monthly x6 months, then go to a every 2 month schedule.     PLAN:  Jamie [name] is tolerating both the letrozole and zoledronic acid well, and I am making no current changes to her regimen. She'll continue on the letrozole daily, and will receive zoledronic acid monthly for now, with her next dose due today.  As I mentioned above, Jamie [name] tells me that she begins to have increased hip pain within 4 weeks after her zoledronic acid infusion, "just when it is time for another one". We will go ahead and treat her monthly for the next 2 months, April 17 and May 14. She will also see Dr. Jana [name] when she returns in May and they'll discuss whether or not to continue monthly treatments or go to every 2 months as previously discussed.   Also as noted above, Jamie [name] is in the process of rescheduling her appointment with Dr. Loanne Drilling, and I encouraged her to do so as soon as possible to get her diabetes under better control. She voices understanding and agreement with our plan is stated above. She'll call with any changes or problems.    Charis Juliana, PA-C     01/04/2014

## 2014-01-04 NOTE — Telephone Encounter (Signed)
Per staff message and POF I have scheduled appts.  JMW  

## 2014-01-04 NOTE — Telephone Encounter (Signed)
, °

## 2014-01-07 ENCOUNTER — Other Ambulatory Visit: Payer: Self-pay | Admitting: *Deleted

## 2014-01-07 ENCOUNTER — Telehealth: Payer: Self-pay | Admitting: Nutrition

## 2014-01-07 ENCOUNTER — Ambulatory Visit: Payer: Medicare HMO | Admitting: Physical Therapy

## 2014-01-07 DIAGNOSIS — E119 Type 2 diabetes mellitus without complications: Secondary | ICD-10-CM

## 2014-01-07 NOTE — Telephone Encounter (Signed)
Patient called requesting appointment for weight loss.  Educated patient that she could schedule an appointment for nutrition education on healthy, plant-based diet.  However, RD at [facility] is not able to provide continuous weight loss followups.  Offered patient opportunity for one-on-one appointment for basic nutrition education as well as an invitation to cancer nutrition class.  Patient declined.  Offered to mail patient healthy diet information.  Patient accepted.

## 2014-01-09 ENCOUNTER — Ambulatory Visit (INDEPENDENT_AMBULATORY_CARE_PROVIDER_SITE_OTHER): Payer: Commercial Managed Care - HMO | Admitting: Endocrinology

## 2014-01-09 ENCOUNTER — Encounter: Payer: Self-pay | Admitting: Endocrinology

## 2014-01-09 ENCOUNTER — Ambulatory Visit: Payer: Medicare HMO | Attending: Internal Medicine

## 2014-01-09 VITALS — BP 122/70 | HR 65 | Temp 97.9°F | Ht 64.0 in | Wt 152.0 lb

## 2014-01-09 DIAGNOSIS — R279 Unspecified lack of coordination: Secondary | ICD-10-CM | POA: Insufficient documentation

## 2014-01-09 DIAGNOSIS — E119 Type 2 diabetes mellitus without complications: Secondary | ICD-10-CM

## 2014-01-09 MED ORDER — PIOGLITAZONE HCL 45 MG PO TABS: 45.0000 mg | ORAL_TABLET | Freq: Every day | ORAL | Status: DC

## 2014-01-09 NOTE — Progress Notes (Signed)
Subjective:    Patient ID: Jamie [name], female    DOB: 1936/03/23, 78 y.o.   MRN: [id number]  HPI pt states DM was dx'ed in 1999-02-08; she has mild neuropathy of the lower extremities; she is unaware of any associated chronic complications.  she has never been on insulin.  pt says his diet and exercise are not good.  She was recently seen in ER with dizziness; glucose was 591 before she left home.  Since then, cbg's have returned to the low-100's.  She did not tolerate metformin. She has mild hypoglycemia approx once a week.  She tried taking just 1/2 of 1 mg amaryl daily, but she abandoned this due to cbg's being persistently in the high-100's.  She doesn't feel as though she cannot take the TID nateglinide.   Past Medical History  Diagnosis Date  . Anxiety   . Asthma   . Breast cancer     metastasized  . Depression   . Diabetes mellitus   . GERD (gastroesophageal reflux disease)   . Hyperlipidemia   . Internal hemorrhoids   . Hx of adenomatous colonic polyps   . IBS (irritable bowel syndrome)   . Mitral valve prolapse   . Ischemic colitis   . Fibromyalgia   . Allergy   . Cataract   . S/P radiation therapy within four to twelve weeks 04/26/13-05/11/13    L-spine/sacrum 30Gy/68fx    Past Surgical History  Procedure Laterality Date  . Mastectomy Left     then treated with chemo and radiation  . Total abdominal hysterectomy    . Back surgery      synovial cyst removed from spine  . Carpal tunnel release Bilateral   . Dilation and curettage of uterus    . Appendectomy    . Cataract surgery Bilateral     History   Social History  . Marital Status: Married    Spouse Name: N/A    Number of Children: N/A  . Years of Education: N/A   Occupational History  . Not on file.   Social History Main Topics  . Smoking status: Never Smoker   . Smokeless tobacco: Never Used  . Alcohol Use: No  . Drug Use: No  . Sexual Activity: Not on file   Other Topics Concern  . Not on  file   Social History Narrative   She used to work as a Quarry manager.  Her husband Laverna Peace is semiretired, working for a Forensic scientist.  What he likes to do is hunt and fish.  Their son Chrissie Noa had significant muscular dystrophy and died in 02/08/2007. Their daughter Marcie Bal lives in [place] and works in an office.  The patient has two granddaughters, both RNs, one working at [employer] and the other in [place].               Current Outpatient Prescriptions on File Prior to Visit  Medication Sig Dispense Refill  . acetaminophen (TYLENOL) 500 MG tablet Take 500-1,000 mg by mouth every 6 (six) hours as needed for mild pain or moderate pain.       . beclomethasone (QVAR) 40 MCG/ACT inhaler Inhale 2 puffs into the lungs 2 (two) times daily.       Marland Kitchen BYSTOLIC 5 MG tablet Take 1 tablet by mouth Daily.      . cefUROXime (CEFTIN) 250 MG tablet Take 250 mg by mouth 2 (two) times daily. For 10 days, dispensed on 12/25/13      . Cholecalciferol (  VITAMIN D) 1000 UNITS capsule Take 1,000-2,000 Units by mouth 2 (two) times daily. 2 in am, 1 at night      . clonazePAM (KLONOPIN) 0.5 MG tablet Take 0.5 mg by mouth 2 (two) times daily as needed for anxiety.      . CRESTOR 5 MG tablet Take 1 tablet by mouth Daily.      Marland Kitchen glimepiride (AMARYL) 1 MG tablet Take 1 mg by mouth at bedtime.      . hydrocortisone (ANUSOL-HC) 2.5 % rectal cream Place rectally daily as needed.      Marland Kitchen letrozole (FEMARA) 2.5 MG tablet Take 1 tablet (2.5 mg total) by mouth daily.  90 tablet  4  . lidocaine-hydrocortisone (ANAMANTEL HC) 3-0.5 % CREA Place 1 Applicatorful rectally 2 (two) times daily as needed for hemorrhoids and bleeding.  28.35 g  0  . meclizine (ANTIVERT) 12.5 MG tablet Take 2 tablets (25 mg total) by mouth 3 (three) times daily as needed for dizziness.  30 tablet  0  . meclizine (ANTIVERT) 25 MG tablet Take 25 mg by mouth as needed for dizziness.       . mupirocin ointment (BACTROBAN) 2 % Place 1 application into the nose daily.         Marland Kitchen NEXIUM 40 MG capsule Take 1 tablet by mouth Daily.      . ONGLYZA 5 MG TABS tablet Take 5 mg by mouth daily.       Vladimir Faster Glycol-Propyl Glycol (SYSTANE OP) Apply 1-2 drops to eye as needed (dry eyes).      Marland Kitchen PROAIR HFA 108 (90 BASE) MCG/ACT inhaler Inhale 1-2 puffs into the lungs every 6 (six) hours as needed.        No current facility-administered medications on file prior to visit.    Allergies  Allergen Reactions  . Hydrocodone Anxiety    Pt. Said she is fine with this medication  . Oxycodone Anxiety  . Niacin Other (See Comments)    Dizziness   . Promethazine Hcl Other (See Comments)    Dizziness "FELT CRAZY"     Family History  Problem Relation Age of Onset  . Prostate cancer Brother     x2  . Irritable bowel syndrome Mother   . Colon cancer Neg Hx   . Stroke Father   DM: mother  BP 122/70  Pulse 65  Temp(Src) 97.9 F (36.6 C) (Oral)  Ht 5\' 4"  (1.626 m)  Wt 152 lb (68.947 kg)  BMI 26.08 kg/m2  SpO2 97%  Review of Systems denies blurry vision, chest pain, n/v, and excessive diaphoresis.  She has chronic weight gain, leg cramps, depression, memory loss, doe, rhinorrhea, easy bruising, cold intolerance, urinary frequency, and headache.    Objective:   Physical Exam VS: see vs page GEN: no distress HEAD: head: no deformity eyes: no periorbital swelling, no proptosis external nose and ears are normal mouth: no lesion seen NECK: supple, thyroid is not enlarged CHEST WALL: no deformity LUNGS:  Clear to auscultation CV: reg rate and rhythm, no murmur ABD: abdomen is soft, nontender.  no hepatosplenomegaly.  not distended.  no hernia MUSCULOSKELETAL: muscle bulk and strength are grossly normal.  no obvious joint swelling.  gait is normal and steady PULSES:  no carotid bruit NEURO: cn 2-12 grossly intact.   readily moves all 4's.  SKIN:  Normal texture and temperature.  No rash or suspicious lesion is visible.   NODES:  None palpable at the neck PSYCH:  alert, well-oriented.  Does not appear anxious nor depressed.   (pt says her a1c last week was 6.3%).    Assessment & Plan:  DM: overcontrolled, for this sulfonylurea-containing regimen. Depression: this complicates the rx of DM, as she says she cannot take a tid med. Weight gain: this also complicates the rx of DM.

## 2014-01-09 NOTE — Patient Instructions (Addendum)
good diet and exercise habits significanly improve the control of your diabetes.  please let me know if you wish to be referred to a dietician.  high blood sugar is very risky to your health.  you should see an eye doctor every year.  You are at higher than average risk for pneumonia and hepatitis-B.  You should be vaccinated against both.   controlling your blood pressure and cholesterol drastically reduces the damage diabetes does to your body.  this also applies to quitting smoking.  please discuss these with your doctor.  check your blood sugar once a day.  vary the time of day when you check, between before the 3 meals, and at bedtime.  also check if you have symptoms of your blood sugar being too high or too low.  please keep a record of the readings and bring it to your next appointment here.  You can write it on any piece of paper.  please call us sooner if your blood sugar goes below 70, or if you have a lot of readings over 200.   For now, let's approach the diabetes as though the high blood sugar last week did not happen.  However, if it happens again, we'll address it then. i have sent a prescription to your pharmacy, to add "pioglitizone."  This takes time to work.  Therefore, for now, reduce the glimepiride to 1/2 pill daily.  Over the next few weeks, you will be able to stop it.   Please come back for a follow-up appointment in 3 months Please call if your legs swell, as this is a side-effect.

## 2014-01-11 ENCOUNTER — Ambulatory Visit: Payer: Medicare HMO

## 2014-01-14 ENCOUNTER — Ambulatory Visit: Payer: Medicare HMO

## 2014-01-18 ENCOUNTER — Encounter: Payer: Self-pay | Admitting: *Deleted

## 2014-01-18 NOTE — Progress Notes (Signed)
Jamie [name]  Clinical Social [name] has been following for supportive counseling and emotional support and phoned to check in. Pt currently struggling with lots of aches and pains and this has been really hard on her lately. She reports that her husband is offering to help more and this is very pleasing to her. He has been helping more at home with chores and activities around the house. Overall, she seems to be in a better place emotionally but still having many physical concerns. She is concerned about her various medical providers and their ability to handle all her issues. CSW will see her prior to her next appointment on 02/01/14.  Clinical Social [name] interventions; Problem solving Emotional support  Jamie Racer, LCSW Clinical Social Worker [facility] Wednesday, Thursday and Friday Phone: [phone] Fax: [phone]

## 2014-01-21 ENCOUNTER — Encounter: Payer: Self-pay | Admitting: Specialist

## 2014-01-21 NOTE — Progress Notes (Signed)
Telephone call. Offered support. Patient talked about having cancer, pain in knee, and a "fluttering bug" in her ear. I helped her identify and recognize the relationships she has to support her, affirmed her faith, and listened to her grief over the loss of her son from Muscular Dystrophy. Offered continued caring presence and support.  Jamie [name], Bonney Roussel, [facility], PhD

## 2014-01-22 ENCOUNTER — Encounter: Payer: Medicare HMO | Admitting: Physical Therapy

## 2014-01-25 ENCOUNTER — Encounter: Payer: Medicare HMO | Admitting: Physical Therapy

## 2014-01-29 ENCOUNTER — Telehealth: Payer: Self-pay | Admitting: *Deleted

## 2014-01-29 NOTE — Telephone Encounter (Signed)
ON Saturday PT. HAD DIARRHEA X4. SHE TOOK IMODIUM WHICH STOPPED THE DIARRHEA. PT. BECAME NAUSEATED ON Saturday WHICH HAS CONTINUED THROUGH TODAY. SHE HAS NOT VOMITED AND IS ABLE TO EAT A SMALL AMOUNT OF FOOD. PT. STATES HER "STOMACH LOOKS SWOLLEN" AND SHE HAS SOME MILD ABDOMINAL PAIN. PT. HAD A SMALL BOWEL MOVEMENT YESTERDAY WITH THE AID OF A SUPPOSITORY. ON Monday PT. BEGAN TAKING REGLAN 5MG  TWO TABS THREE TIMES A DAY WHICH HELPS A LITTLE. PT. HAS CONTACTED HER PRIMARY CARE PHYSICIAN, DR.RUSSO. A PA CAN SEE HER AT 11:00AM TODAY. ENCOURAGED PT. TO SEE DR.RUSSO'S PA TODAY SO SHE CAN BE EVALUATED. PT. WILL CALL DR.RUSSO'S OFFICE TO REQUEST THE 11:00AM APPOINTMENT WITH THE PA. SHE WILL CALL THIS OFFICE LATER THIS WEEK WITH AN UPDATE.

## 2014-01-30 ENCOUNTER — Telehealth: Payer: Self-pay | Admitting: *Deleted

## 2014-01-30 NOTE — Telephone Encounter (Signed)
   Provider input needed: Nausea, diarrhea   Reason for call: 'I feel sick and do not think I can keep my appointments on Friday"  Constitutional: positive for malaise and "I don't want to sit up in bed.  My stomach feels like 'Yuk'.  Saw PA at PCP yesterday and I feel worse today" Gastrointestinal: positive for diarrhea and nausea   ALLERGIES:  is allergic to hydrocodone; oxycodone; metformin and related; niacin; and promethazine hcl.  Patient last received chemotherapy/ treatment on Faslodex 05-18-2013,   Patient was last seen in the office on 01-04-2014  Next appt is 02-01-2014 for lab, PA, zometa  Is patient having fevers greater than 100.5?  no   Is patient having uncontrolled pain, or new pain? no, "my stomach just feels yucky"   Is patient having new back pain that changes with position (worsens or eases when laying down?)  no   Is patient able to eat and drink? no, "when I eat it makes me have to go to the bathroom and more nausea.  I am trying to drink"    Is patient able to pass stool without difficulty?   yes, four episodes of diarrhea 01-26-2014, one diarrhea stool today.  Has taken imodium     Is patient having uncontrolled nausea?  yes, Reports haven taken "anti-emetic prescribed by Dr. Constance Holster but can't tell any difference.  Usually carafate helps but what I have expired December 2014.  The PA suggested I take my neclizine"    patient calls 01/30/2014 with complaint of  Constitutional: positive for malaise Gastrointestinal: positive for diarrhea, nausea and "I don't know if they're missing anything.  Is my cancer acting up and I don't want to come in there if I'm sick.  I can't eat, don't want to sit up in bed, was told to take meclizine which I take for menieres, continue immodium and align because I was on an antibiotic two weeks ago for a sinus infection.  What do you recommend?"   Summary Based on the above information advised patient to  Continue meds as instructed by  primary care and call this office as she was seen there on yesterday.  Try Gatorade to drink and this nurse will notify our staff and call if any further instructions.   Annalaura Sauseda [place]Spruiell  01/30/2014, 1:40 PM   Background Info  SHELBYLYNN WALCZYK   DOB: 10/02/1936   MR#: [id number]   CSN#   [id number] 01/30/2014

## 2014-01-30 NOTE — Telephone Encounter (Signed)
Agree she should check with her PCP regarding visit yesterday. This is not related to her Faslodex, nor do I think it is related at all to her cancer. It is probably a separate GI bug. Encourage her to take 2 immodium after loose stools, push fluids and avoid solids until she stops having stomach cramping. She may be better in 24 hours.

## 2014-01-30 NOTE — Telephone Encounter (Signed)
Called patient with instructions from [place] [facility].  Patient asked about CARAFATE from this office.  Instructed to call [facility] in [place] for refill on carafate.

## 2014-01-31 ENCOUNTER — Ambulatory Visit (INDEPENDENT_AMBULATORY_CARE_PROVIDER_SITE_OTHER): Payer: Commercial Managed Care - HMO | Admitting: Gastroenterology

## 2014-01-31 ENCOUNTER — Telehealth: Payer: Self-pay | Admitting: Internal Medicine

## 2014-01-31 ENCOUNTER — Encounter: Payer: Self-pay | Admitting: *Deleted

## 2014-01-31 ENCOUNTER — Other Ambulatory Visit: Payer: Self-pay | Admitting: Physician Assistant

## 2014-01-31 VITALS — BP 124/54 | HR 76 | Ht 63.39 in | Wt 147.2 lb

## 2014-01-31 DIAGNOSIS — R110 Nausea: Secondary | ICD-10-CM

## 2014-01-31 DIAGNOSIS — C7951 Secondary malignant neoplasm of bone: Principal | ICD-10-CM

## 2014-01-31 DIAGNOSIS — K589 Irritable bowel syndrome without diarrhea: Secondary | ICD-10-CM

## 2014-01-31 DIAGNOSIS — R109 Unspecified abdominal pain: Secondary | ICD-10-CM

## 2014-01-31 DIAGNOSIS — C50919 Malignant neoplasm of unspecified site of unspecified female breast: Secondary | ICD-10-CM

## 2014-01-31 MED ORDER — METOCLOPRAMIDE HCL 5 MG PO TABS: 5.0000 mg | ORAL_TABLET | Freq: Three times a day (TID) | ORAL | Status: DC | PRN

## 2014-01-31 NOTE — Telephone Encounter (Signed)
Spoke with patient and she had problems with diarrhea, nausea and gas for one week. She saw her PCP's PA and was told to take Align.(she took antibiotics 1 month ago) She has tried this but is not any better. The diarrhea is better but still having nausea and cannot eat. Also, report lots of gas and bloating. States "I am sick at my stomach."  She did try Carafate yesterday without relief. Scheduled with Alonza Bogus, PA today at 11:00 AM.

## 2014-01-31 NOTE — Progress Notes (Signed)
01/31/2014 Jamie [name] [id number] 04/07/1936   History of Present Illness: Jamie [name] is a pleasant 78 year old white female known to Dr. Delfin Edis. She does have history of adenomatous colon polyps and chronic GERD, adult onset diabetes mellitus and fibromyalgia. She has also been dealing with metastatic breast cancer with widespread skeletal metastases and is followed by Dr. Jana Hakim. She was last seen here in February of 2014 at that time with complaints of abdominal fullness, indigestion, bloating, early satiety, and nausea. Her last upper endoscopy was in 2008 and this was normal.  She was scheduled for a repeat EGD and a GES last year when she was seen, but cancelled her studies because she does not have the money to pay for all of her medical bills and procedures.  She has not been seen in our office since that time.  She has chronic ongoing complaints of abdominal discomfort and gas.  She has a history of IBS and has struggled with GI complaints all of her life.  She knows that a lot of her symptoms are likely related to or worsened by her nerves and stress.  Has a lot of stress with health and financial issues.  She comes in today though more for complaints of nausea that has been present since Saturday.  She says that she had four episodes of diarrhea on Saturday and then developed the nausea as well.  The diarrhea seems to have resolved and she just had one loose BM today, but the nausea has continued and she is afraid to eat because of it.  She has not vomited, however.  She has been tolerating liquids.  She has a few reglan pills at home and she took one, which seemed to help but just took a few hours to be effective.  It did also make her feel like she could actually eat something but she was afraid to.  She cannot afford Zofran, and Phenergan is listed under her allergies because it "makes me crazy".  She has dicyclomine at home that she used to take that to help calm her  stomach/intestinal issues, but she said she stopped it because she was taking it a few times a day and it was causing constipation.  She does take Nexium 40 mg daily.  She also has carafate at home that she has taken in the past but just took a couple of doses over the past day or so.    She also complains of pain over her right later ribs and most recent bone scan showed increased uptake in that area, likely c/w metastatic disease.  I have asked her to discuss this pain with her oncologist.   Current Medications, Allergies, Past Medical History, Past Surgical History, Family History and Social History were reviewed in [employer] record.   Physical Exam: BP 124/54  Pulse 76  Ht 5' 3.39" (1.61 m)  Wt 147 lb 4 oz (66.792 kg)  BMI 25.77 kg/m2 General: Well developed white female in no acute distress Head: Normocephalic and atraumatic Eyes:  Sclerae anicteric, conjunctiva pink  Ears: Normal auditory acuity Lungs: Clear throughout to auscultation Heart: Regular rate and rhythm Abdomen: Soft, non-distended.  Normal bowel sounds.  Mild lower abdominal TTP without R/R/G. Musculoskeletal: Symmetrical with no gross deformities  Extremities: No edema  Neurological: Alert oriented x 4, grossly non-focal Psychological:  Alert and cooperative. Normal mood and affect  Assessment and Recommendations: -New complaints of nausea:  Present for 5 days and initially began with some  diarrhea as well.  ? Viral gastroenteritis of some sort.  Will have her take the reglan 5 mg every 8 hours for the next several days.  She will slowly increase her PO intake to bland foods.  We will have her begin taking her carafate regularly for now as well. -Ongoing, chronic complaints of abdominal cramping and gas:  Will try restarting her dicyclomine once or twice daily even for a short time to see if this helps to ease her symptoms.  *She will call back for any persistent or worsening symptoms.

## 2014-01-31 NOTE — Progress Notes (Signed)
Reviewed. She may try OTC Pepto bismol 2 tabs tid prn ongoing diarrhea or "gas"

## 2014-01-31 NOTE — Patient Instructions (Signed)
Please call back if you are not feeling better.  We have sent the following medications to your pharmacy for you to pick up at your convenience: Reglan 5 mg, please take one tablet by mouth three times daily Continue your Dicyclomine  Start your Carafate four times daily

## 2014-02-01 ENCOUNTER — Other Ambulatory Visit (HOSPITAL_BASED_OUTPATIENT_CLINIC_OR_DEPARTMENT_OTHER): Payer: Commercial Managed Care - HMO

## 2014-02-01 ENCOUNTER — Ambulatory Visit (HOSPITAL_BASED_OUTPATIENT_CLINIC_OR_DEPARTMENT_OTHER): Payer: Commercial Managed Care - HMO | Admitting: Physician Assistant

## 2014-02-01 ENCOUNTER — Encounter: Payer: Self-pay | Admitting: Physician Assistant

## 2014-02-01 ENCOUNTER — Telehealth: Payer: Self-pay | Admitting: Oncology

## 2014-02-01 ENCOUNTER — Ambulatory Visit (HOSPITAL_COMMUNITY)
Admission: RE | Admit: 2014-02-01 | Discharge: 2014-02-01 | Disposition: A | Discharge status: Home / Self Care | Payer: Medicare HMO | Source: Ambulatory Visit | Attending: Physician Assistant | Admitting: Physician Assistant

## 2014-02-01 ENCOUNTER — Telehealth: Payer: Self-pay | Admitting: *Deleted

## 2014-02-01 ENCOUNTER — Ambulatory Visit (HOSPITAL_BASED_OUTPATIENT_CLINIC_OR_DEPARTMENT_OTHER): Payer: Commercial Managed Care - HMO

## 2014-02-01 VITALS — BP 156/72 | HR 73 | Temp 97.8°F | Resp 18 | Ht 63.0 in | Wt 148.8 lb

## 2014-02-01 DIAGNOSIS — R109 Unspecified abdominal pain: Secondary | ICD-10-CM

## 2014-02-01 DIAGNOSIS — C7951 Secondary malignant neoplasm of bone: Secondary | ICD-10-CM

## 2014-02-01 DIAGNOSIS — N949 Unspecified condition associated with female genital organs and menstrual cycle: Secondary | ICD-10-CM

## 2014-02-01 DIAGNOSIS — F329 Major depressive disorder, single episode, unspecified: Secondary | ICD-10-CM

## 2014-02-01 DIAGNOSIS — R0781 Pleurodynia: Secondary | ICD-10-CM

## 2014-02-01 DIAGNOSIS — F411 Generalized anxiety disorder: Secondary | ICD-10-CM

## 2014-02-01 DIAGNOSIS — R0789 Other chest pain: Secondary | ICD-10-CM

## 2014-02-01 DIAGNOSIS — C50419 Malignant neoplasm of upper-outer quadrant of unspecified female breast: Secondary | ICD-10-CM

## 2014-02-01 DIAGNOSIS — R5381 Other malaise: Secondary | ICD-10-CM

## 2014-02-01 DIAGNOSIS — C50919 Malignant neoplasm of unspecified site of unspecified female breast: Secondary | ICD-10-CM | POA: Insufficient documentation

## 2014-02-01 DIAGNOSIS — R05 Cough: Secondary | ICD-10-CM

## 2014-02-01 DIAGNOSIS — R102 Pelvic and perineal pain unspecified side: Secondary | ICD-10-CM

## 2014-02-01 DIAGNOSIS — M545 Low back pain, unspecified: Secondary | ICD-10-CM

## 2014-02-01 DIAGNOSIS — R1013 Epigastric pain: Secondary | ICD-10-CM

## 2014-02-01 DIAGNOSIS — M549 Dorsalgia, unspecified: Secondary | ICD-10-CM

## 2014-02-01 DIAGNOSIS — Z9010 Acquired absence of unspecified breast and nipple: Secondary | ICD-10-CM | POA: Insufficient documentation

## 2014-02-01 DIAGNOSIS — C7952 Secondary malignant neoplasm of bone marrow: Secondary | ICD-10-CM

## 2014-02-01 DIAGNOSIS — F3289 Other specified depressive episodes: Secondary | ICD-10-CM

## 2014-02-01 DIAGNOSIS — R059 Cough, unspecified: Secondary | ICD-10-CM

## 2014-02-01 DIAGNOSIS — M25559 Pain in unspecified hip: Secondary | ICD-10-CM

## 2014-02-01 DIAGNOSIS — M25551 Pain in right hip: Secondary | ICD-10-CM

## 2014-02-01 DIAGNOSIS — R5383 Other fatigue: Secondary | ICD-10-CM

## 2014-02-01 DIAGNOSIS — R079 Chest pain, unspecified: Secondary | ICD-10-CM | POA: Insufficient documentation

## 2014-02-01 DIAGNOSIS — Z170 Estrogen receptor positive status [ER+]: Secondary | ICD-10-CM

## 2014-02-01 LAB — CBC WITH DIFFERENTIAL/PLATELET
BASO%: 0.4 % (ref 0.0–2.0)
Basophils Absolute: 0 10*3/uL (ref 0.0–0.1)
EOS ABS: 0.1 10*3/uL (ref 0.0–0.5)
EOS%: 1.3 % (ref 0.0–7.0)
HCT: 36.3 % (ref 34.8–46.6)
HGB: 12.3 g/dL (ref 11.6–15.9)
LYMPH%: 26.9 % (ref 14.0–49.7)
MCH: 32.3 pg (ref 25.1–34.0)
MCHC: 33.9 g/dL (ref 31.5–36.0)
MCV: 95.3 fL (ref 79.5–101.0)
MONO#: 0.5 10*3/uL (ref 0.1–0.9)
MONO%: 9.2 % (ref 0.0–14.0)
NEUT%: 62.2 % (ref 38.4–76.8)
NEUTROS ABS: 3.5 10*3/uL (ref 1.5–6.5)
Platelets: 219 10*3/uL (ref 145–400)
RBC: 3.81 10*6/uL (ref 3.70–5.45)
RDW: 13.1 % (ref 11.2–14.5)
WBC: 5.6 10*3/uL (ref 3.9–10.3)
lymph#: 1.5 10*3/uL (ref 0.9–3.3)

## 2014-02-01 LAB — COMPREHENSIVE METABOLIC PANEL (CC13)
ALBUMIN: 4.1 g/dL (ref 3.5–5.0)
ALT: 14 U/L (ref 0–55)
AST: 21 U/L (ref 5–34)
Alkaline Phosphatase: 77 U/L (ref 40–150)
Anion Gap: 10 mEq/L (ref 3–11)
BILIRUBIN TOTAL: 0.29 mg/dL (ref 0.20–1.20)
BUN: 12 mg/dL (ref 7.0–26.0)
CO2: 28 mEq/L (ref 22–29)
Calcium: 9.8 mg/dL (ref 8.4–10.4)
Chloride: 103 mEq/L (ref 98–109)
Creatinine: 0.7 mg/dL (ref 0.6–1.1)
GLUCOSE: 129 mg/dL (ref 70–140)
POTASSIUM: 3.7 meq/L (ref 3.5–5.1)
SODIUM: 141 meq/L (ref 136–145)
TOTAL PROTEIN: 7.2 g/dL (ref 6.4–8.3)

## 2014-02-01 MED ORDER — ZOLEDRONIC ACID 4 MG/100ML IV SOLN
4.0000 mg | Freq: Once | INTRAVENOUS | Status: AC
  Administered 2014-02-01: 4 mg via INTRAVENOUS
  Filled 2014-02-01: qty 100

## 2014-02-01 MED ORDER — SODIUM CHLORIDE 0.9 % IV SOLN
Freq: Once | INTRAVENOUS | Status: AC
  Administered 2014-02-01: 12:00:00 via INTRAVENOUS

## 2014-02-01 NOTE — Telephone Encounter (Signed)
Called pt to inform her of Xray results. Today pt presented with right rib pain. Communicated to pt that results showed no active disease nor rib fractures. Pt was pleased with results but still puzzled by this pain. She thinks she may have strained a muscle hanging clothes outside. Message to be forwarded to [employer], PA-C.

## 2014-02-01 NOTE — Patient Instructions (Signed)

## 2014-02-01 NOTE — Telephone Encounter (Signed)
perpof to sch appts. pt to have CXR today-C/A/P-gave pt solution to drink/adv [facility] will call w/appt

## 2014-02-01 NOTE — Progress Notes (Signed)
ID: Jamie [name]   DOB: 09-17-1936  MR#: [id number]  KCL#:[id number]  PCP: Jamie Reel, MD GYN: Jamie Farrier, MD SU: OTHER MD: Jamie Shin, MD;  Jamie Edis, MD  CHIEF COMPLAINT:  Metastatic Breast Cancer   HISTORY OF PRESENT ILLNESS: The patient's cancer was uncovered by an unusually circuitous route. She presented with stomach upset.  Because of a history of colitis, Dr. Teena [name] who happened to be on call for Dr. Cristina [name], obtained CT of the abdomen and pelvis on August 30, 2005.  As far as the abdomen and pelvis were concerned, there was no evidence of colitis but there were multiple scattered sclerotic lesions in the bone suggesting possible sclerotic metastatic disease (versus osteopoikilosis.)  Accordingly a bone scan was obtained.  This was done on September 01, 2005, and found tiny sclerotic lesions, and in particular a lesion of concern in the sternum.  Accordingly a CT scan of the chest was obtained.  This found the sternal problem to correspond with degenerative changes. However, it also showed a soft tissue nodule in the left breast.  The patient then had mammograms on September 27, 2005.  This found two areas of spiculation and architectural distortion in the upper outer quadrant of the left breast.  By ultrasound these were hyperechoic and irregular, highly suspicious for carcinoma.  Biopsy was obtained the same day and showed ([id number], as well as [phone]) invasive lobular carcinoma, with both lesions biopsied being ER and PR positive, both herceptest positive, both negative by FISH.  Accordingly the patient was referred to Dr. Georgette [name] and breast MRI was obtained October 06, 2006.  No abnormal enhancement was noted in the right breast.  There was enhancing nodularity in the upper portion of the left breast where several discrete nodules were seen.  Accordingly Dr. Georgette [name] performed a left modified radical mastectomy on November 02, 2005.  The patient had a positive sentinel  lymph node and Dr. Georgette [name] performed a left axillary lymph node dissection.  However, only three additional lymph nodes were recovered, two of which also showed metastatic involvement (all had extra capsular extension.)    The patient's subsequent history is as detailed below  INTERVAL HISTORY: Jamie [name] returns alone today for followup of her metastatic breast carcinoma. She continues on the letrozole and zoledronic acid, zoledronic acid given monthly, and due again today. She continues to tolerate both agents well.  She's had no additional jaw pain, and denies any recent dental procedures or extractions. She has occasional hot flashes, but nothing she would consider problematic. She continues to have diffuse bone pain and joint pain, including back pain and hip pain. She has had increased pain in the left knee, and in fact is scheduled for her a steroid injection in the knee in the next couple of weeks. She's recently noted a significant increase in rib pain on the right side, and is concerned that she may have fractured a rib. She does have an increased dry cough.  She recently had an apparent GI virus with nausea, abdominal pain and diarrhea, but fortunately this has resolved over the last couple of days.  She tells me she feels "more like herself" today, and was finally able to eat solid foods over the past 24 hours.  Jamie [name] does tell me that she has had increased pelvic pain, fullness, and pressure. This encompasses the lower portion of the abdomen, and has been present now for several weeks. She feels bloated but is having regular bowel movements (with  the exception of the recent diarrhea associated with an apparent virus as noted above).  She does not feel like this pain is bony pain associated with her known bony metastasis, and feels like it is deeper. She denies any change in urinary habits whatsoever, no dysuria, no hematuria, and no incontinence.  REVIEW OF SYSTEMS: Jamie [name] has had no fevers or  chills, even with the recent virus. She does occasionally have some difficulty sleeping and she has moderate fatigue. She continues to have problems with anxiety and depression but denies suicidal ideation. She is especially tearful today, as tomorrow would have been her son's birthday (he died in 12/12/06).  She's had no rashes or skin changes and denies any abnormal bleeding. She does have some blurred vision and tearing of the eyes which she attributes to seasonal allergies, but denies any significant rhinorrhea or sinus congestion. Her cough is nonproductive. She's had no increased shortness of breath and denies any chest pain or palpitations. She does have occasional irregular heartbeat. She denies any abnormal headaches or dizziness. She denies any peripheral swelling.  A detailed review of systems is otherwise stable and noncontributory.     PAST MEDICAL HISTORY: Past Medical History  Diagnosis Date  . Anxiety   . Asthma   . Breast cancer     metastasized  . Depression   . Diabetes mellitus   . GERD (gastroesophageal reflux disease)   . Hyperlipidemia   . Internal hemorrhoids   . IBS (irritable bowel syndrome)   . Mitral valve prolapse   . Ischemic colitis   . Fibromyalgia   . Allergy   . Cataract   . S/P radiation therapy within four to twelve weeks 04/26/13-05/11/13    L-spine/sacrum 30Gy/15f  . Colon polyp 202-25-13   TUBULAR ADENOMA  Significant for diabetes, hypercholesterolemia, asthma, mitral valve prolapse requiring antibiotic prophylaxis before any invasive procedures, Meniere's disease, gastroesophageal reflux disease, osteoarthritis, degenerative disk disease, history of fibromyalgia, panic attacks, history of synovial cyst removal, history of total hysterectomy with bilateral salpingo-oophorectomy, history of septoplasty x2, status post appendectomy, status post dilatation and curettage remotely, status post bilateral carpal tunnel release under Jamie [name].  PAST  SURGICAL HISTORY: Past Surgical History  Procedure Laterality Date  . Mastectomy Left     then treated with chemo and radiation  . Total abdominal hysterectomy    . Back surgery      synovial cyst removed from spine  . Carpal tunnel release Bilateral   . Dilation and curettage of uterus    . Appendectomy    . Cataract extraction Bilateral   . Colonoscopy w/ biopsies  202/25/13 . Esophagogastroduodenoscopy  2Feb 25, 2008   normal    FAMILY HISTORY The patient's father died at the age of 742from a stroke.  The patient's mother died at the age of 145 also from a stroke.  The patient has three brothers; one died with "liver disease."  One has prostate cancer.  The other brother has prostate and colon cancer.  The only breast cancer in the family was the patient's mother's mother and she does not know how old her grandmother was when she was diagnosed.  There is no history of ovarian cancer in the family.  GYNECOLOGIC HISTORY: She is GXP2.  She had her hysterectomy while still menstruating and she took hormone replacement until December 2006, when she had her breast biopsy.  SOCIAL HISTORY: (Updated 02/01/2014) She used to work as a [employer] manager  [name](  who is the son of my former patient Maridel Pixler) is semiretired, working for a [employer].  What he likes to do is hunt and fish.  Their son Chrissie Noa had significant muscular dystrophy and died in Dec 30, 2006. Their daughter Marcie Bal lives in [place] and works in an office.  The patient has two granddaughters, both RNs, one working at [employer] and the other in [place].    ADVANCED DIRECTIVES:  HEALTH MAINTENANCE:  (Updated 02/01/2014) History  Substance Use Topics  . Smoking status: Never Smoker   . Smokeless tobacco: Never Used  . Alcohol Use: No     Colonoscopy:  July 2013, Dr. Olevia Perches  PAP: s/p hysterectomy  Bone density:  November 2014 at [facility], normal  Lipid panel: Dr. Virgina Jock    Allergies  Allergen Reactions   . Hydrocodone Anxiety    Pt. Said she is fine with this medication  . Oxycodone Anxiety  . Metformin And Related     GI sxs  . Morphine And Related   . Niacin Other (See Comments)    Dizziness   . Promethazine Hcl Other (See Comments)    Dizziness "FELT CRAZY"     Current Outpatient Prescriptions  Medication Sig Dispense Refill  . beclomethasone (QVAR) 40 MCG/ACT inhaler Inhale 2 puffs into the lungs 2 (two) times daily.       Marland Kitchen BYSTOLIC 5 MG tablet Take 1 tablet by mouth Daily.      . Cholecalciferol (VITAMIN D) 1000 UNITS capsule Take 1,000-2,000 Units by mouth 2 (two) times daily. 2 in am, 1 at night      . clonazePAM (KLONOPIN) 0.5 MG tablet Take 0.5 mg by mouth 3 (three) times daily as needed for anxiety.       . CRESTOR 5 MG tablet Take 1 tablet by mouth Daily.      Marland Kitchen dicyclomine (BENTYL) 10 MG capsule Take 10 mg by mouth every morning.      Marland Kitchen glimepiride (AMARYL) 1 MG tablet Take 1 mg by mouth daily.       . hydrochlorothiazide (HYDRODIURIL) 25 MG tablet Take 1/4 tablet by mouth three time per week      . letrozole (FEMARA) 2.5 MG tablet Take 1 tablet (2.5 mg total) by mouth daily.  90 tablet  4  . lidocaine-hydrocortisone (ANAMANTEL HC) 3-0.5 % CREA Place 1 Applicatorful rectally 2 (two) times daily as needed for hemorrhoids and bleeding.  28.35 g  0  . metoCLOPramide (REGLAN) 5 MG tablet Take 1 tablet (5 mg total) by mouth every 8 (eight) hours as needed for nausea.  30 tablet  1  . mupirocin ointment (BACTROBAN) 2 % Place 1 application into the nose daily.       Marland Kitchen NEXIUM 40 MG capsule Take 1 tablet by mouth Daily.      . ONGLYZA 5 MG TABS tablet Take 5 mg by mouth daily.       Vladimir Faster Glycol-Propyl Glycol (SYSTANE OP) Apply 1-2 drops to eye as needed (dry eyes).      Marland Kitchen PROAIR HFA 108 (90 BASE) MCG/ACT inhaler Inhale 1-2 puffs into the lungs every 6 (six) hours as needed.       . sucralfate (CARAFATE) 1 G tablet Take 1 g by mouth 4 (four) times daily.      Marland Kitchen  acetaminophen (TYLENOL) 500 MG tablet Take 500-1,000 mg by mouth every 6 (six) hours as needed for mild pain or moderate pain.       Marland Kitchen  GLYCERIN ADULT 2 G suppository       . hydrocortisone (ANUSOL-HC) 2.5 % rectal cream Place rectally daily as needed.      . meclizine (ANTIVERT) 25 MG tablet Take 25 mg by mouth as needed for dizziness.        No current facility-administered medications for this visit.    OBJECTIVE: Middle-aged white woman who appears anxious but is in no acute distress. Filed Vitals:   02/01/14 1027  BP: 156/72  Pulse: 73  Temp: 97.8 F (36.6 C)  Resp: 18     Body mass index is 26.37 kg/(m^2).    ECOG FS: 1 Filed Weights   02/01/14 1027  Weight: 148 lb 12.8 oz (67.495 kg)   Physical Exam: HEENT:  Sclerae anicteric.  Oropharynx clear, pink, and moist. No mucositis or candidiasis. Neck supple, trachea midline. No thyromegaly.  NODES:  No cervical or supraclavicular lymphadenopathy palpated.  BREAST EXAM: Deferred.  Axillae are benign bilaterally, no palpable lymphadenopathy. LUNGS:  Clear to auscultation bilaterally with good excursion. No crackles, wheezes, or rhonchi HEART:  Regular rate and rhythm. Soft systolic murmur appreciated, stable ABDOMEN:  Abdomen is slightly distended. Diffusely tender to palpation, but more so in the lower right and left quadrants and pelvic region.  No organomegaly or masses palpated. Positive bowel sounds.  MSK:  No focal spinal tenderness to gentle palpation. Good range of motion bilaterally in the upper extremities. EXTREMITIES:  No peripheral edema.  No lymphedema in the left upper extremity. SKIN:  Benign with no visible rashes or skin changes. No excessive ecchymoses. No petechiae. No pallor. NEURO:  Nonfocal. Well oriented.  Anxious affect.      LAB RESULTS: Lab Results  Component Value Date   WBC 5.6 02/01/2014   NEUTROABS 3.5 02/01/2014   HGB 12.3 02/01/2014   HCT 36.3 02/01/2014   MCV 95.3 02/01/2014   PLT 219 02/01/2014       Chemistry      Component Value Date/Time   NA 141 02/01/2014 1015   NA 140 01/01/2014 1617   K 3.7 02/01/2014 1015   K 5.2 01/01/2014 1617   CL 102 01/01/2014 1617   CL 103 03/22/2013 0754   CO2 28 02/01/2014 1015   CO2 26 01/01/2014 1610   BUN 12.0 02/01/2014 1015   BUN 15 01/01/2014 1617   CREATININE 0.7 02/01/2014 1015   CREATININE 0.60 01/01/2014 1617      Component Value Date/Time   CALCIUM 9.8 02/01/2014 1015   CALCIUM 9.3 01/01/2014 1610   ALKPHOS 77 02/01/2014 1015   ALKPHOS 74 01/01/2014 1610   AST 21 02/01/2014 1015   AST 38* 01/01/2014 1610   ALT 14 02/01/2014 1015   ALT 19 01/01/2014 1610   BILITOT 0.29 02/01/2014 1015   BILITOT 0.3 01/01/2014 1610       STUDIES:  Patient had an MRI of the brain in March during a brief hospitalization, which was normal for her age, with no evidence of metastasis, no enhancing masses, and no leptomeningeal enhancement.  Most recent bone density at [facility] 08/21/2013 was normal.   Most recent right screening mammogram was 06/25/2013 and was unremarkable.  Dg Mandible 4 Views 09/26/2013   CLINICAL DATA:  The patient has known metastatic breast malignancy to bone. The patient is now complaining of mandibular pain left greater than right.  EXAM: MANDIBLE - 4+ VIEW  COMPARISON:  Nuclear bone scan dated July 10, 2013  FINDINGS: The mandibular body and rami appear  adequately mineralized. There is no definite lytic or blastic lesion or periosteal reaction. As best as can be determined the mandibular condyles are intact.  IMPRESSION: There is no plain film evidence of metastatic disease to the mandible.   Electronically Signed   By: David  Martinique   On: 09/26/2013 10:55      ASSESSMENT:77 y.o.  Jamie [name] woman    (1) status post left modified radical mastectomy in January 2007 for a T1c N1, stage IIA  invasive lobular carcinoma, grade 1, strongly estrogen and progesterone receptor positive, HER-2 negative, with an MIB-1 of  7%.  (2)  Status post radiation given concurrently with Capecitabine.  Declined chemotherapy.    (3) Status post anastrozole and exemestane, both discontinued due to aches and pains.  Status post tamoxifen between October 2007 until August 2011, discontinued secondary to cramps   (4) multiple sclerotic bony lesions noted at initial diagnosis, biopsied x2 August 2009 without malignancy documented, on zoledronic acid annually starting 12/12/2007. Increased to monthly starting 07/17/2013  (5) On fulvestrant between September 2011 and 05/17/2013 with likely progression of bony disease  (6)  radiation to the lumbosacral spine and right hip to treat right hip pain to 30 gray completed 02/58/5277, complicated by [possible] radiation induced colitis  (7)  on letrozole since September 2014.  (8) receiving zoledronic acid monthly basis beginning September 2014, the plan being to continue treating monthly x6 months, then go to a every 2 month schedule.     PLAN:  Demaris is tolerating both the letrozole and zoledronic acid well, and will continue with both. I have refilled her letrozole today. She'll receive her next infusion of zoledronic acid today, and will be due for her next infusion on May 14. She will be seeing Dr. Jana Hakim that day to followup on her metastatic disease. Prior to that visit, we will recheck her bone scan (most recent in December 2014), and will also obtain restaging CTs of the chest, abdomen, and pelvis (most recent in July 2014), especially in light of her increased abdominal/pelvic pain and continuing cough.   In the meanwhile, I am also going to grab a quick chest x-ray today to assess the increased pain in the right ribs and evaluate for any rib fractures. She does have known bony disease in that area, but is concerned over the significant increase in pain over the past 2 weeks.  All of the above was reviewed in detail with Wells Guiles today. She voices her understanding and  agreement with our plan, and she knows to call with any changes or problems prior to her appointment here in May.   Theotis Burrow, PA-C     02/01/2014

## 2014-02-04 ENCOUNTER — Encounter: Payer: Self-pay | Admitting: Oncology

## 2014-02-04 NOTE — Progress Notes (Signed)
Discussed w/ pt her options for financial assistance.  Informed pt she didn't qualify for a discount thru the hospital because she is insured.  I informed her that she could get set up on a payment plan or apply for the [employer] card.  We also discussed the [employer] and [employer].  Pt will bring in her and her husband's income to see if they qualify for the grants.

## 2014-02-08 ENCOUNTER — Encounter: Payer: Self-pay | Admitting: Oncology

## 2014-02-08 NOTE — Progress Notes (Signed)
Pt is approved for $1000 [employer] and $400 [employer] grants.  °

## 2014-02-15 ENCOUNTER — Encounter: Payer: Self-pay | Admitting: *Deleted

## 2014-02-15 NOTE — Progress Notes (Signed)
[place] Work  Clinical Social Work phoned pt and left her a message several weeks ago, as pt missed appt with CSW. Pt returned call and wants to reschedule with CSW. Pt would prefer to meet with CSW during her infusion and reports an increase in stressors recently. CSW allowed pt to discuss and process these issues. CSW will follow up at her next infusion.   Clinical Social Work interventions: Supportive listening  Loren Racer, [place] Social Worker [facility] [place] for [place] Wednesday, Thursday and Friday Phone: [phone] Fax: [phone]

## 2014-02-25 ENCOUNTER — Ambulatory Visit (HOSPITAL_COMMUNITY)
Admission: RE | Admit: 2014-02-25 | Discharge: 2014-02-25 | Disposition: A | Discharge status: Home / Self Care | Payer: Medicare HMO | Source: Ambulatory Visit | Attending: Physician Assistant | Admitting: Physician Assistant

## 2014-02-25 ENCOUNTER — Encounter (HOSPITAL_COMMUNITY)
Admission: RE | Admit: 2014-02-25 | Discharge: 2014-02-25 | Disposition: A | Discharge status: Home / Self Care | Payer: Medicare HMO | Source: Ambulatory Visit | Attending: Physician Assistant | Admitting: Physician Assistant

## 2014-02-25 DIAGNOSIS — C7951 Secondary malignant neoplasm of bone: Secondary | ICD-10-CM | POA: Insufficient documentation

## 2014-02-25 DIAGNOSIS — C50919 Malignant neoplasm of unspecified site of unspecified female breast: Secondary | ICD-10-CM

## 2014-02-25 DIAGNOSIS — C7952 Secondary malignant neoplasm of bone marrow: Secondary | ICD-10-CM

## 2014-02-25 DIAGNOSIS — R102 Pelvic and perineal pain: Secondary | ICD-10-CM

## 2014-02-25 DIAGNOSIS — R0781 Pleurodynia: Secondary | ICD-10-CM

## 2014-02-25 DIAGNOSIS — Z9010 Acquired absence of unspecified breast and nipple: Secondary | ICD-10-CM | POA: Insufficient documentation

## 2014-02-25 DIAGNOSIS — Z923 Personal history of irradiation: Secondary | ICD-10-CM | POA: Insufficient documentation

## 2014-02-25 MED ORDER — TECHNETIUM TC 99M MEDRONATE IV KIT
26.2000 | PACK | Freq: Once | INTRAVENOUS | Status: AC | PRN
Start: 2014-02-25 — End: 2014-02-25
  Administered 2014-02-25: 26.2 via INTRAVENOUS

## 2014-02-25 MED ORDER — IOHEXOL 300 MG/ML  SOLN
100.0000 mL | Freq: Once | INTRAMUSCULAR | Status: AC | PRN
  Administered 2014-02-25: 100 mL via INTRAVENOUS

## 2014-02-28 ENCOUNTER — Encounter: Payer: Self-pay | Admitting: *Deleted

## 2014-02-28 ENCOUNTER — Other Ambulatory Visit (HOSPITAL_BASED_OUTPATIENT_CLINIC_OR_DEPARTMENT_OTHER): Payer: Commercial Managed Care - HMO

## 2014-02-28 ENCOUNTER — Ambulatory Visit (HOSPITAL_BASED_OUTPATIENT_CLINIC_OR_DEPARTMENT_OTHER): Payer: Commercial Managed Care - HMO

## 2014-02-28 ENCOUNTER — Ambulatory Visit (HOSPITAL_BASED_OUTPATIENT_CLINIC_OR_DEPARTMENT_OTHER): Payer: Commercial Managed Care - HMO | Admitting: Oncology

## 2014-02-28 ENCOUNTER — Other Ambulatory Visit: Payer: Self-pay | Admitting: *Deleted

## 2014-02-28 VITALS — BP 128/68 | HR 78 | Temp 98.3°F | Resp 18 | Ht 63.0 in | Wt 147.9 lb

## 2014-02-28 DIAGNOSIS — C50919 Malignant neoplasm of unspecified site of unspecified female breast: Secondary | ICD-10-CM

## 2014-02-28 DIAGNOSIS — J45909 Unspecified asthma, uncomplicated: Secondary | ICD-10-CM

## 2014-02-28 DIAGNOSIS — E119 Type 2 diabetes mellitus without complications: Secondary | ICD-10-CM

## 2014-02-28 DIAGNOSIS — C7951 Secondary malignant neoplasm of bone: Secondary | ICD-10-CM

## 2014-02-28 DIAGNOSIS — C7952 Secondary malignant neoplasm of bone marrow: Secondary | ICD-10-CM

## 2014-02-28 DIAGNOSIS — F411 Generalized anxiety disorder: Secondary | ICD-10-CM

## 2014-02-28 DIAGNOSIS — Z8719 Personal history of other diseases of the digestive system: Secondary | ICD-10-CM

## 2014-02-28 DIAGNOSIS — M797 Fibromyalgia: Secondary | ICD-10-CM

## 2014-02-28 DIAGNOSIS — C50419 Malignant neoplasm of upper-outer quadrant of unspecified female breast: Secondary | ICD-10-CM

## 2014-02-28 DIAGNOSIS — F3289 Other specified depressive episodes: Secondary | ICD-10-CM

## 2014-02-28 DIAGNOSIS — F329 Major depressive disorder, single episode, unspecified: Secondary | ICD-10-CM

## 2014-02-28 LAB — COMPREHENSIVE METABOLIC PANEL (CC13)
ALT: 18 U/L (ref 0–55)
ANION GAP: 16 meq/L — AB (ref 3–11)
AST: 21 U/L (ref 5–34)
Albumin: 3.8 g/dL (ref 3.5–5.0)
Alkaline Phosphatase: 84 U/L (ref 40–150)
BUN: 10.8 mg/dL (ref 7.0–26.0)
CALCIUM: 9.9 mg/dL (ref 8.4–10.4)
CHLORIDE: 105 meq/L (ref 98–109)
CO2: 21 meq/L — AB (ref 22–29)
Creatinine: 0.8 mg/dL (ref 0.6–1.1)
Glucose: 141 mg/dl — ABNORMAL HIGH (ref 70–140)
POTASSIUM: 3.7 meq/L (ref 3.5–5.1)
SODIUM: 141 meq/L (ref 136–145)
TOTAL PROTEIN: 7 g/dL (ref 6.4–8.3)
Total Bilirubin: 0.26 mg/dL (ref 0.20–1.20)

## 2014-02-28 LAB — CBC WITH DIFFERENTIAL/PLATELET
BASO%: 0.8 % (ref 0.0–2.0)
Basophils Absolute: 0 10*3/uL (ref 0.0–0.1)
EOS%: 1.7 % (ref 0.0–7.0)
Eosinophils Absolute: 0.1 10*3/uL (ref 0.0–0.5)
HEMATOCRIT: 36.5 % (ref 34.8–46.6)
HGB: 12.6 g/dL (ref 11.6–15.9)
LYMPH%: 29.9 % (ref 14.0–49.7)
MCH: 33 pg (ref 25.1–34.0)
MCHC: 34.5 g/dL (ref 31.5–36.0)
MCV: 95.7 fL (ref 79.5–101.0)
MONO#: 0.3 10*3/uL (ref 0.1–0.9)
MONO%: 7.3 % (ref 0.0–14.0)
NEUT#: 2.7 10*3/uL (ref 1.5–6.5)
NEUT%: 60.3 % (ref 38.4–76.8)
Platelets: 238 10*3/uL (ref 145–400)
RBC: 3.81 10*6/uL (ref 3.70–5.45)
RDW: 13.1 % (ref 11.2–14.5)
WBC: 4.4 10*3/uL (ref 3.9–10.3)
lymph#: 1.3 10*3/uL (ref 0.9–3.3)

## 2014-02-28 MED ORDER — ZOLEDRONIC ACID 4 MG/100ML IV SOLN
4.0000 mg | Freq: Once | INTRAVENOUS | Status: AC
  Administered 2014-02-28: 4 mg via INTRAVENOUS
  Filled 2014-02-28: qty 100

## 2014-02-28 MED ORDER — SODIUM CHLORIDE 0.9 % IV SOLN
Freq: Once | INTRAVENOUS | Status: AC
  Administered 2014-02-28: 16:00:00 via INTRAVENOUS

## 2014-02-28 NOTE — Patient Instructions (Signed)
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
## 2014-02-28 NOTE — Progress Notes (Signed)
ID: Jamie [name]   DOB: 12-02-1935  MR#: [id number]  QAS#:[id number]  PCP: [name], MD GYN: Everlene Farrier, MD SU: OTHER MD: Renato Shin, MD;  Delfin Edis, MD  CHIEF COMPLAINT:  Metastatic Breast Cancer   BREAST CANCER HISTORY: The patient's cancer was uncovered by an unusually circuitous route. She presented with stomach upset.  Because of a history of colitis, Dr. Teena Irani who happened to be on call for Dr. Cristina Gong, obtained CT of the abdomen and pelvis on August 30, 2005.  As far as the abdomen and pelvis were concerned, there was no evidence of colitis but there were multiple scattered sclerotic lesions in the bone suggesting possible sclerotic metastatic disease (versus osteopoikilosis.)  Accordingly a bone scan was obtained.  This was done on September 01, 2005, and found tiny sclerotic lesions, and in particular a lesion of concern in the sternum.  Accordingly a CT scan of the chest was obtained.  This found the sternal problem to correspond with degenerative changes. However, it also showed a soft tissue nodule in the left breast.  The patient then had mammograms on September 27, 2005.  This found two areas of spiculation and architectural distortion in the upper outer quadrant of the left breast.  By ultrasound these were hyperechoic and irregular, highly suspicious for carcinoma.  Biopsy was obtained the same day and showed ([id number], as well as [id number]) invasive lobular carcinoma, with both lesions biopsied being ER and PR positive, both herceptest positive, both negative by FISH.  Accordingly the patient was referred to Dr. Georgette Dover and breast MRI was obtained October 06, 2006.  No abnormal enhancement was noted in the right breast.  There was enhancing nodularity in the upper portion of the left breast where several discrete nodules were seen.  Accordingly Dr. Georgette Dover performed a left modified radical mastectomy on November 02, 2005.  The patient had a positive sentinel lymph  node and Dr. Georgette Dover performed a left axillary lymph node dissection.  However, only three additional lymph nodes were recovered, two of which also showed metastatic involvement (all had extra capsular extension.)    The patient's subsequent history is as detailed below  INTERVAL HISTORY: Jamie [name] returns today for followup of her metastatic breast carcinoma. She continues on letrozole and zoledronic acid, with that zoledronic acid given monthly. She just had restaging studies including a bone scan and CTs of the chest abdomen and pelvis. There are no new bony lesions and there is no soft tissue spread. There is a question whether there has been some disease progression in some of the bony lesions in the chest, but other lesions appear entirely stable   REVIEW OF SYSTEMS: Jamie [name] has "good days and bad days". On terrible days she feels very stiff, can't walk, and has pain in her left knee and she and. Her sugar goes up. On good days she feels less stiff and has no pain. She cannot tell why things would vary so much. She describes herself as tired all the time. She has seasonal sinus problems, with a little bit of a dry cough, some abdominal pain, feels anxious and depressed, and there continues to be significant stress at home. She has been discussing this with our Education officer, museum. Overall a detailed review of systems today was stable  PAST MEDICAL HISTORY: Past Medical History  Diagnosis Date  . Anxiety   . Asthma   . Breast cancer     metastasized  . Depression   . Diabetes mellitus   .  GERD (gastroesophageal reflux disease)   . Hyperlipidemia   . Internal hemorrhoids   . IBS (irritable bowel syndrome)   . Mitral valve prolapse   . Ischemic colitis   . Fibromyalgia   . Allergy   . Cataract   . S/P radiation therapy within four to twelve weeks 04/26/13-05/11/13    L-spine/sacrum 30Gy/96fx  . Colon polyp 12-30-11    TUBULAR ADENOMA  Significant for diabetes, hypercholesterolemia, asthma, mitral valve  prolapse requiring antibiotic prophylaxis before any invasive procedures, Meniere's disease, gastroesophageal reflux disease, osteoarthritis, degenerative disk disease, history of fibromyalgia, panic attacks, history of synovial cyst removal, history of total hysterectomy with bilateral salpingo-oophorectomy, history of septoplasty x2, status post appendectomy, status post dilatation and curettage remotely, status post bilateral carpal tunnel release under Dr. Clair Gulling [place].  PAST SURGICAL HISTORY: Past Surgical History  Procedure Laterality Date  . Mastectomy Left     then treated with chemo and radiation  . Total abdominal hysterectomy    . Back surgery      synovial cyst removed from spine  . Carpal tunnel release Bilateral   . Dilation and curettage of uterus    . Appendectomy    . Cataract extraction Bilateral   . Colonoscopy w/ biopsies  12/30/2011  . Esophagogastroduodenoscopy  Dec 30, 2006    normal    FAMILY HISTORY The patient's father died at the age of 29 from a stroke.  The patient's mother died at the age of 77, also from a stroke.  The patient has three brothers; one died with "liver disease."  One has prostate cancer.  The other brother has prostate and colon cancer.  The only breast cancer in the family was the patient's mother's mother and she does not know how old her grandmother was when she was diagnosed.  There is no history of ovarian cancer in the family.  GYNECOLOGIC HISTORY: She is GXP2.  She had her hysterectomy while still menstruating and she took hormone replacement until December 2006, when she had her breast biopsy.  SOCIAL HISTORY: (Updated 02/01/2014) She used to work as a Quarry manager.  Her husband Laverna Peace (who is the son of my former patient Annaliz Aven) is semiretired, working for a Fore[employer].  What he likes to do is hunt and fish.  Their son Chrissie Noa had significant muscular dystrophy and died in 12/30/2006. Their daughter Marcie Bal lives in [place] and works in an office.   The patient has two granddaughters, both RNs, one working at [employer] and the other in [place].    ADVANCED DIRECTIVES:  HEALTH MAINTENANCE:  (Updated 02/01/2014) History  Substance Use Topics  . Smoking status: Never Smoker   . Smokeless tobacco: Never Used  . Alcohol Use: No     Colonoscopy:  July 2013, Dr. Olevia Perches  PAP: s/p hysterectomy  Bone density:  November 2014 at [facility], normal  Lipid panel: Dr. Virgina Jock    Allergies  Allergen Reactions  . Hydrocodone Anxiety    Pt. Said she is fine with this medication  . Oxycodone Anxiety  . Metformin And Related     GI sxs  . Morphine And Related   . Niacin Other (See Comments)    Dizziness   . Promethazine Hcl Other (See Comments)    Dizziness "FELT CRAZY"     Current Outpatient Prescriptions  Medication Sig Dispense Refill  . acetaminophen (TYLENOL) 500 MG tablet Take 500-1,000 mg by mouth every 6 (six) hours as needed for mild pain or moderate  pain.       . beclomethasone (QVAR) 40 MCG/ACT inhaler Inhale 2 puffs into the lungs 2 (two) times daily.       Marland Kitchen BYSTOLIC 5 MG tablet Take 1 tablet by mouth Daily.      . Cholecalciferol (VITAMIN D) 1000 UNITS capsule Take 1,000-2,000 Units by mouth 2 (two) times daily. 2 in am, 1 at night      . clonazePAM (KLONOPIN) 0.5 MG tablet Take 0.5 mg by mouth 3 (three) times daily as needed for anxiety.       . CRESTOR 5 MG tablet Take 1 tablet by mouth Daily.      Marland Kitchen dicyclomine (BENTYL) 10 MG capsule Take 10 mg by mouth every morning.      Marland Kitchen glimepiride (AMARYL) 1 MG tablet Take 1 mg by mouth daily.       Marland Kitchen GLYCERIN ADULT 2 G suppository       . hydrochlorothiazide (HYDRODIURIL) 25 MG tablet Take 1/4 tablet by mouth three time per week      . hydrocortisone (ANUSOL-HC) 2.5 % rectal cream Place rectally daily as needed.      Marland Kitchen letrozole (FEMARA) 2.5 MG tablet Take 1 tablet (2.5 mg total) by mouth daily.  90 tablet  4  . lidocaine-hydrocortisone (ANAMANTEL HC) 3-0.5 %  CREA Place 1 Applicatorful rectally 2 (two) times daily as needed for hemorrhoids and bleeding.  28.35 g  0  . meclizine (ANTIVERT) 25 MG tablet Take 25 mg by mouth as needed for dizziness.       . metoCLOPramide (REGLAN) 5 MG tablet Take 1 tablet (5 mg total) by mouth every 8 (eight) hours as needed for nausea.  30 tablet  1  . mupirocin ointment (BACTROBAN) 2 % Place 1 application into the nose daily.       Marland Kitchen NEXIUM 40 MG capsule Take 1 tablet by mouth Daily.      . ONGLYZA 5 MG TABS tablet Take 5 mg by mouth daily.       Vladimir Faster Glycol-Propyl Glycol (SYSTANE OP) Apply 1-2 drops to eye as needed (dry eyes).      Marland Kitchen PROAIR HFA 108 (90 BASE) MCG/ACT inhaler Inhale 1-2 puffs into the lungs every 6 (six) hours as needed.       . sucralfate (CARAFATE) 1 G tablet Take 1 g by mouth 4 (four) times daily.       No current facility-administered medications for this visit.    OBJECTIVE: Middle-aged white woman in no acute distress. Filed Vitals:   02/28/14 1417  BP: 128/68  Pulse: 78  Temp: 98.3 F (36.8 C)  Resp: 18     Body mass index is 26.21 kg/(m^2).    ECOG FS: 1 Filed Weights   02/28/14 1417  Weight: 147 lb 14.4 oz (67.087 kg)   Sclerae unicteric, pupils equal and reactive Oropharynx clear and moist, No cervical or supraclavicular adenopathy Lungs no rales or rhonchi Heart regular rate and rhythm Abd soft, nontender, positive bowel sounds MSK no focal spinal tenderness, no upper extremity lymphedema Neuro: nonfocal, well oriented, tearful affect Breasts: The right breast is unremarkable. The left breast is status post mastectomy. There is no evidence of local recurrence. The left axilla is benign.   LAB RESULTS: Lab Results  Component Value Date   WBC 4.4 02/28/2014   NEUTROABS 2.7 02/28/2014   HGB 12.6 02/28/2014   HCT 36.5 02/28/2014   MCV 95.7 02/28/2014   PLT 238 02/28/2014  Chemistry      Component Value Date/Time   NA 141 02/28/2014 1355   NA 140 01/01/2014 1617    K 3.7 02/28/2014 1355   K 5.2 01/01/2014 1617   CL 102 01/01/2014 1617   CL 103 03/22/2013 0754   CO2 21* 02/28/2014 1355   CO2 26 01/01/2014 1610   BUN 10.8 02/28/2014 1355   BUN 15 01/01/2014 1617   CREATININE 0.8 02/28/2014 1355   CREATININE 0.60 01/01/2014 1617      Component Value Date/Time   CALCIUM 9.9 02/28/2014 1355   CALCIUM 9.3 01/01/2014 1610   ALKPHOS 84 02/28/2014 1355   ALKPHOS 74 01/01/2014 1610   AST 21 02/28/2014 1355   AST 38* 01/01/2014 1610   ALT 18 02/28/2014 1355   ALT 19 01/01/2014 1610   BILITOT 0.26 02/28/2014 1355   BILITOT 0.3 01/01/2014 1610       STUDIES: Dg Chest 2 View  02/01/2014   CLINICAL DATA:  Metastatic breast cancer , rib cage pain  EXAM: CHEST  2 VIEW  COMPARISON:  12/18/2013  FINDINGS: Cardiomediastinal silhouette is stable. No acute infiltrate or pleural effusion. No pulmonary edema. Again noted status post left mastectomy and left axillary lymph node dissection. No gross fractures are identified. No destructive bony lesions. No pneumothorax.  IMPRESSION: No active disease. No gross fractures are identified. No pneumothorax. Move   Electronically Signed   By: Lahoma Crocker [name].   On: 02/01/2014 14:48   Ct Chest W Contrast  02/25/2014   CLINICAL DATA:  Followup metastatic breast carcinoma. Right-sided rib pain. Pelvic pain. Currently undergoing treatment with Zometa. Previous radiation therapy.  EXAM: CT CHEST, ABDOMEN, AND PELVIS WITH CONTRAST  TECHNIQUE: Multidetector CT imaging of the chest, abdomen and pelvis was performed following the standard protocol during bolus administration of intravenous contrast.  CONTRAST:  155mL OMNIPAQUE IOHEXOL 300 MG/ML  SOLN  COMPARISON:  Abdomen pelvis CT on 05/14/2013 and chest CT on 04/01/2011  FINDINGS: CT CHEST FINDINGS  Postop changes from left mastectomy and axillary lymph node dissection remain stable. No evidence of chest wall mass or axillary lymphadenopathy. No evidence of mediastinal or hilar lymphadenopathy. No  evidence of pleural or pericardial effusion.  Post radiation changes in the anterior left lung remains stable. No evidence of acute infiltrate or central endobronchial obstruction. 5 mm irregular nodular density in the posterior right lower lobe on image 32 remain stable. No new or enlarging pulmonary nodules or masses are identified. Sclerotic bone metastases seen throughout the thoracic spine show mild interval progression since prior exam with several sclerotic metastases showing definite increase in size since most recent chest CT on 04/01/2011. Majority of sclerotic bone metastases show no significant change.  CT ABDOMEN AND PELVIS FINDINGS  The liver, adrenal glands, spleen, pancreas, gallbladder, and kidneys remain normal in appearance except for a tiny nonobstructive right renal calculus. No evidence of hydronephrosis. No soft tissue masses or lymphadenopathy identified within the abdomen or pelvis.  Prior hysterectomy noted. Adnexal regions are unremarkable in appearance. Normal appendix is visualized. No evidence of inflammatory process or abnormal fluid collections. No evidence of bowel obstruction. Diffuse sclerotic bone metastases throughout the lumbar spine and pelvis show no significant change compared to most recent abdomen pelvis CT on 05/14/2013.  IMPRESSION: Mild progression of diffuse sclerotic bone metastases in thorax compared to most recent prior chest CT in 2012. Diffuse sclerotic bone metastases in abdomen and pelvis show no significant change compared to most recent prior abdomen pelvis CT  in 2014.  No evidence of soft tissue metastatic disease within the chest, abdomen, or pelvis.   Electronically Signed   By: Earle Gell [name].   On: 02/25/2014 15:42   Nm Bone Scan Whole Body  02/25/2014   CLINICAL DATA:  Metastatic breast cancer with known bone involvement. Assess for stability/response to treatment.  EXAM: NUCLEAR MEDICINE WHOLE BODY BONE SCAN  TECHNIQUE: Whole body anterior and  posterior images were obtained approximately 3 hours after intravenous injection of radiopharmaceutical.  RADIOPHARMACEUTICALS:  26.2 mCi Technetium-99 MDP  COMPARISON:  10/15/2013 bone scan. 02/25/2014 CT of the abdomen pelvis.  FINDINGS: When compared to the prior bone scan, progressive uptake consistent with progresses osseous metastatic disease involving the right aspect of C1 and right ninth rib.  Taking into account slight differences in technique, multiple areas of radiotracer uptake consistent with metastatic disease otherwise appear relatively similar to that prior exam including radiotracer uptake involving; mid to lower cervical spine, thoracic spine, lumbar spine, right fourth rib, sternal manubrial junction and pelvis.  Areas of radiotracer uptake involving the feet, knees and shoulder suggestive of degenerative changes.  IMPRESSION: When compared to the prior bone scan, progressive uptake consistent with progresses osseous metastatic disease involving the right aspect of C1 and right ninth rib.  Remainder of findings relatively similar to prior exam as detailed above.   Electronically Signed   By: Chauncey Cruel [name].   On: 02/25/2014 16:49   Ct Abdomen Pelvis W Contrast  02/25/2014   CLINICAL DATA:  Followup metastatic breast carcinoma. Right-sided rib pain. Pelvic pain. Currently undergoing treatment with Zometa. Previous radiation therapy.  EXAM: CT CHEST, ABDOMEN, AND PELVIS WITH CONTRAST  TECHNIQUE: Multidetector CT imaging of the chest, abdomen and pelvis was performed following the standard protocol during bolus administration of intravenous contrast.  CONTRAST:  145mL OMNIPAQUE IOHEXOL 300 MG/ML  SOLN  COMPARISON:  Abdomen pelvis CT on 05/14/2013 and chest CT on 04/01/2011  FINDINGS: CT CHEST FINDINGS  Postop changes from left mastectomy and axillary lymph node dissection remain stable. No evidence of chest wall mass or axillary lymphadenopathy. No evidence of mediastinal or hilar  lymphadenopathy. No evidence of pleural or pericardial effusion.  Post radiation changes in the anterior left lung remains stable. No evidence of acute infiltrate or central endobronchial obstruction. 5 mm irregular nodular density in the posterior right lower lobe on image 32 remain stable. No new or enlarging pulmonary nodules or masses are identified. Sclerotic bone metastases seen throughout the thoracic spine show mild interval progression since prior exam with several sclerotic metastases showing definite increase in size since most recent chest CT on 04/01/2011. Majority of sclerotic bone metastases show no significant change.  CT ABDOMEN AND PELVIS FINDINGS  The liver, adrenal glands, spleen, pancreas, gallbladder, and kidneys remain normal in appearance except for a tiny nonobstructive right renal calculus. No evidence of hydronephrosis. No soft tissue masses or lymphadenopathy identified within the abdomen or pelvis.  Prior hysterectomy noted. Adnexal regions are unremarkable in appearance. Normal appendix is visualized. No evidence of inflammatory process or abnormal fluid collections. No evidence of bowel obstruction. Diffuse sclerotic bone metastases throughout the lumbar spine and pelvis show no significant change compared to most recent abdomen pelvis CT on 05/14/2013.  IMPRESSION: Mild progression of diffuse sclerotic bone metastases in thorax compared to most recent prior chest CT in 2012. Diffuse sclerotic bone metastases in abdomen and pelvis show no significant change compared to most recent prior abdomen pelvis CT in 2014.  No  evidence of soft tissue metastatic disease within the chest, abdomen, or pelvis.   Electronically Signed   By: Earle Gell [name].   On: 02/25/2014 15:42    ASSESSMENT:77 y.o.  Jamie [name] woman    (1) status post left modified radical mastectomy in January 2007 for a T1c N1, stage IIA  invasive lobular carcinoma, grade 1, strongly estrogen and progesterone receptor  positive, HER-2 negative, with an MIB-1 of 7%.  (2)  Status post radiation given concurrently with Capecitabine.  Declined chemotherapy.    (3) Status post anastrozole and exemestane, both discontinued due to aches and pains.  Status post tamoxifen between October 2007 until August 2011, discontinued secondary to cramps   (4) multiple sclerotic bony lesions noted at initial diagnosis, biopsied x2 August 2009 without malignancy documented, on zoledronic acid annually starting 12/12/2007. Increased to monthly starting 07/17/2013  (5) On fulvestrant between September 2011 and 05/17/2013 with likely progression of bony disease  (6)  radiation to the lumbosacral spine and right hip to treat right hip pain to 30 gray completed 59/45/8592, complicated by [possible] radiation induced colitis  (7)  on letrozole since September 2014.  (8) receiving zoledronic acid monthly basis beginning September 2014, after May 2015 switched to denosumab   PLAN:  Jamie [name] is tolerating the treatment well. As always with bone only disease it is difficult to know whether what we are seeing on the scans is progression or sclerosis secondary to healing. It is favorable that we do not see any evidence of soft tissue disease. We do not see any new bone lesions, which would certainly clench the matter.  We discussed proceeding to a bone biopsy. She has had 2 of these in the past, neither diagnostic. At this point she does not feel she could proceed to anything that would cause pain. She is "just not up to it". But we are going to do as far as her treatment is concerned is switching from zolendronate to denosumab. This may be more effective but in any case it will be easier for her to receive. We are also going to start following the CA 27-29, which was noninformative previously, but perhaps can give [place] another parameter to follow at this point.   Jamie [name]'s biggest problem continues to be this situation at home. She met with our social  worker today to discuss that at length. Otherwise she will see me again in August. She knows to call for any problems that may develop before that visit.      Chauncey Cruel, MD     02/28/2014

## 2014-03-03 ENCOUNTER — Telehealth: Payer: Self-pay | Admitting: Oncology

## 2014-03-03 NOTE — Telephone Encounter (Signed)
s.w. pt and advised on June appt....pt ok and aware will pick up sched at nxt visit

## 2014-03-08 ENCOUNTER — Encounter: Payer: Self-pay | Admitting: *Deleted

## 2014-03-08 NOTE — Progress Notes (Signed)
Jamie [name]  Clinical Social [name] phoned pt to follow up from last week, to reassess needs. Pt has not reached out to some of the resources CSW provided. Pt stated she still had them, but feels things are smoother at home currently. Pt feels well physically and emotionally currently. Pt reports her daughter in law has improved greatly and feels this has resulted in her husband coping better. She continues to grieve for the distance and lack of communication from her children. She has been busy lately around her house and trying different activities each day.  Pt is looking forward to her shot in June. She feels her treatment is going well. She agrees to meet with CSW at her next appt on June 11.   Pt appreciated call and will call CSW as needed.   Clinical Social [name] interventions: Supportive listening and assessment.   Loren Racer, LCSW Clinical Social Worker [facility] [place] Wednesday, Thursday and Friday Phone: [phone] Fax: [phone]

## 2014-03-21 ENCOUNTER — Telehealth: Payer: Self-pay | Admitting: *Deleted

## 2014-03-21 NOTE — Telephone Encounter (Signed)
This RN spoke with pt per her call inquiring " about new shot he wants to give me at my next visit "  This RN reviewed chart and noted plan to start Xgeva.  Discussed medication with pt including need for calcium replacement.  " oh I already take it - I take 3000units a day... Is that too much ?"  This RN reviewed recent lab and noted normal calcium level. Requested pt to bring all vitamin and supplements with her at next visit for review and appropriate recommendations.  No further questions at this time.

## 2014-03-28 ENCOUNTER — Ambulatory Visit: Payer: Commercial Managed Care - HMO

## 2014-03-28 ENCOUNTER — Other Ambulatory Visit (HOSPITAL_BASED_OUTPATIENT_CLINIC_OR_DEPARTMENT_OTHER): Payer: Commercial Managed Care - HMO

## 2014-03-28 VITALS — BP 147/62 | HR 74 | Temp 98.2°F

## 2014-03-28 DIAGNOSIS — R0781 Pleurodynia: Secondary | ICD-10-CM

## 2014-03-28 DIAGNOSIS — C7952 Secondary malignant neoplasm of bone marrow: Secondary | ICD-10-CM

## 2014-03-28 DIAGNOSIS — M545 Low back pain, unspecified: Secondary | ICD-10-CM

## 2014-03-28 DIAGNOSIS — C7951 Secondary malignant neoplasm of bone: Secondary | ICD-10-CM

## 2014-03-28 DIAGNOSIS — C50919 Malignant neoplasm of unspecified site of unspecified female breast: Secondary | ICD-10-CM

## 2014-03-28 DIAGNOSIS — C50419 Malignant neoplasm of upper-outer quadrant of unspecified female breast: Secondary | ICD-10-CM

## 2014-03-28 DIAGNOSIS — M25551 Pain in right hip: Secondary | ICD-10-CM

## 2014-03-28 LAB — COMPREHENSIVE METABOLIC PANEL (CC13)
ALBUMIN: 3.9 g/dL (ref 3.5–5.0)
ALT: 14 U/L (ref 0–55)
AST: 21 U/L (ref 5–34)
Alkaline Phosphatase: 90 U/L (ref 40–150)
Anion Gap: 9 mEq/L (ref 3–11)
BILIRUBIN TOTAL: 0.22 mg/dL (ref 0.20–1.20)
BUN: 15.3 mg/dL (ref 7.0–26.0)
CO2: 29 mEq/L (ref 22–29)
Calcium: 9.5 mg/dL (ref 8.4–10.4)
Chloride: 103 mEq/L (ref 98–109)
Creatinine: 0.9 mg/dL (ref 0.6–1.1)
Glucose: 140 mg/dl (ref 70–140)
POTASSIUM: 4 meq/L (ref 3.5–5.1)
SODIUM: 141 meq/L (ref 136–145)
Total Protein: 7.1 g/dL (ref 6.4–8.3)

## 2014-03-28 LAB — CBC WITH DIFFERENTIAL/PLATELET
BASO%: 0.6 % (ref 0.0–2.0)
Basophils Absolute: 0 10*3/uL (ref 0.0–0.1)
EOS%: 1.5 % (ref 0.0–7.0)
Eosinophils Absolute: 0.1 10*3/uL (ref 0.0–0.5)
HCT: 36.6 % (ref 34.8–46.6)
HGB: 12.4 g/dL (ref 11.6–15.9)
LYMPH#: 1.8 10*3/uL (ref 0.9–3.3)
LYMPH%: 30.4 % (ref 14.0–49.7)
MCH: 32.8 pg (ref 25.1–34.0)
MCHC: 34 g/dL (ref 31.5–36.0)
MCV: 96.6 fL (ref 79.5–101.0)
MONO#: 0.5 10*3/uL (ref 0.1–0.9)
MONO%: 9 % (ref 0.0–14.0)
NEUT#: 3.4 10*3/uL (ref 1.5–6.5)
NEUT%: 58.5 % (ref 38.4–76.8)
Platelets: 242 10*3/uL (ref 145–400)
RBC: 3.79 10*6/uL (ref 3.70–5.45)
RDW: 13.2 % (ref 11.2–14.5)
WBC: 5.9 10*3/uL (ref 3.9–10.3)

## 2014-03-28 MED ORDER — DENOSUMAB 120 MG/1.7ML ~~LOC~~ SOLN
120.0000 mg | Freq: Once | SUBCUTANEOUS | Status: DC
  Filled 2014-03-28: qty 1.7

## 2014-03-28 NOTE — Progress Notes (Signed)
Jamie [name] here for get started on X-geva.  Was explaining about how we would check her Calcium level and that she needed to keep her dental appointments and let the dentist know about being started on X-geva.  Explained to her that it can cause problems with the jaw bone.  She stated that she was having some pain in her left jaw, that she had been to the ENT who said that she needed to check with her dentist.  She has an appointment with her dentist the end of the month.  Suggested that she try to see him before then.   Spoke with Dr Jana Hakim who said that we would Hold the X-geva until she gets the jaw pain checked out. Jamie [name] understands this and will call us when she knows what the dentist says about the jaw pain.

## 2014-03-29 LAB — CANCER ANTIGEN 27.29: CA 27.29: 94 U/mL — ABNORMAL HIGH (ref 0–39)

## 2014-04-01 ENCOUNTER — Encounter: Payer: Self-pay | Admitting: *Deleted

## 2014-04-01 ENCOUNTER — Ambulatory Visit (HOSPITAL_BASED_OUTPATIENT_CLINIC_OR_DEPARTMENT_OTHER): Payer: Commercial Managed Care - HMO

## 2014-04-01 VITALS — BP 141/62 | HR 70 | Temp 98.3°F

## 2014-04-01 DIAGNOSIS — C7951 Secondary malignant neoplasm of bone: Secondary | ICD-10-CM

## 2014-04-01 DIAGNOSIS — C50919 Malignant neoplasm of unspecified site of unspecified female breast: Secondary | ICD-10-CM

## 2014-04-01 DIAGNOSIS — M25551 Pain in right hip: Secondary | ICD-10-CM

## 2014-04-01 DIAGNOSIS — M545 Low back pain, unspecified: Secondary | ICD-10-CM

## 2014-04-01 DIAGNOSIS — C50419 Malignant neoplasm of upper-outer quadrant of unspecified female breast: Secondary | ICD-10-CM

## 2014-04-01 DIAGNOSIS — C7952 Secondary malignant neoplasm of bone marrow: Secondary | ICD-10-CM

## 2014-04-01 DIAGNOSIS — R0781 Pleurodynia: Secondary | ICD-10-CM

## 2014-04-01 MED ORDER — DENOSUMAB 120 MG/1.7ML ~~LOC~~ SOLN
120.0000 mg | Freq: Once | SUBCUTANEOUS | Status: AC
  Administered 2014-04-01: 120 mg via SUBCUTANEOUS
  Filled 2014-04-01: qty 1.7

## 2014-04-01 NOTE — Progress Notes (Signed)
Consent form signed for X-geva injections.    She brought a note from her dentist stating that her teeth and gums appear to be healthy. X-geva injection given as ordered.

## 2014-04-01 NOTE — Progress Notes (Unsigned)
Jamie [name] Work  Clinical Social Work was referred by Thayer Headings, LPN, for transportation needs.  Clinical Social Worker spoke with patient by phone to offer support and assess for needs.  The patient shared she is currently having car issues and needs transportation to cancer center appointments.  Due to patient's location, CSW provided information on [facility] and will encourage patient to contact her insurer which may possible have a transportation program.  Patient verbalized understanding and plans to contact [facility] for assistance.   Polo Riley, MSW, LCSW, OSW-C Clinical Social Worker [facility] [phone]

## 2014-04-05 ENCOUNTER — Encounter: Payer: Self-pay | Admitting: *Deleted
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
## 2014-04-12 ENCOUNTER — Encounter: Payer: Self-pay | Admitting: *Deleted

## 2014-04-12 ENCOUNTER — Telehealth: Payer: Self-pay | Admitting: *Deleted

## 2014-04-12 DIAGNOSIS — M25552 Pain in left hip: Secondary | ICD-10-CM

## 2014-04-12 DIAGNOSIS — M79605 Pain in left leg: Secondary | ICD-10-CM

## 2014-04-12 DIAGNOSIS — C50919 Malignant neoplasm of unspecified site of unspecified female breast: Secondary | ICD-10-CM

## 2014-04-12 DIAGNOSIS — C7951 Secondary malignant neoplasm of bone: Principal | ICD-10-CM

## 2014-04-12 NOTE — Telephone Encounter (Signed)
This RN spoke with pt per her call stating " pain in my left leg is getting worse".  Jamie [name] states pains " starts at my hip but feels the worse below my knee " " pain is keeping me awake at night "  Per further inquiry Jamie [name] denies any swelling or redness of leg.  She is not having changes in ambulation overall.  Jamie [name] states pain is better with use of pain med " but I end up needing to take it about 3 times a day "  This RN discussed with Jamie [name] known cancer in her bones and management of chronic pain is best controlled when she takes her pain medication on a routine basis.  RN informed pt rationale of more difficult to get good control of pain the longer and worse pain is when you take medication.  Per discussion plan will be for pt to use pain medication to keep pain at a minimum. Jamie [name] verbalized understanding of need to use appropriate interventions to lessen constipation due to pain medication.  Xray will be ordered of Left hip and leg for evaluation of pain and possible fracture.  Jamie [name] understands per discussion with this RN that if leg swells or develops redness or pain is not controlled she should proceed to the ER.

## 2014-04-12 NOTE — Progress Notes (Signed)
[place] Work  Clinical Social Work was referred by patient for assessment of psychosocial needs.  Patient contacted CSW about transportation and financial concerns. CSW had contacted [facility] on behalf of pt. They report they may be able to assist, but are waiting on budget approval to proceed. CSW informed pt about the status of that request. Pt continues to have financial struggles due to medical bills, but most bills are in her husband's name and thus makes it difficult to receive assistance currently.  Pt is interested in [employer] and CSW will follow up on referral. CSW encouraged pt to try to come to support groups, [place] would be a great one for her. Transportation is such an issue that she can't get out, but to appointments. Pt need supportive listening and emotional support today. She has concerns about progression of her disease. CSW will continue to follow and support pt.      Clinical Social Work interventions: Catering manager and referral Supportive listening and emotional support  Loren Racer, LCSW Clinical Social Worker [facility] for [place] Wednesday, Thursday and Friday Phone: [phone] Fax: [phone]

## 2014-04-13 ENCOUNTER — Ambulatory Visit (HOSPITAL_COMMUNITY)
Admission: RE | Admit: 2014-04-13 | Discharge: 2014-04-13 | Disposition: A | Discharge status: Home / Self Care | Payer: Medicare HMO | Source: Ambulatory Visit | Attending: Physician Assistant | Admitting: Physician Assistant

## 2014-04-13 DIAGNOSIS — C7951 Secondary malignant neoplasm of bone: Secondary | ICD-10-CM

## 2014-04-13 DIAGNOSIS — C50919 Malignant neoplasm of unspecified site of unspecified female breast: Secondary | ICD-10-CM | POA: Insufficient documentation

## 2014-04-13 DIAGNOSIS — M25552 Pain in left hip: Secondary | ICD-10-CM

## 2014-04-13 DIAGNOSIS — M79605 Pain in left leg: Secondary | ICD-10-CM

## 2014-04-13 DIAGNOSIS — M79609 Pain in unspecified limb: Secondary | ICD-10-CM | POA: Insufficient documentation

## 2014-04-15 ENCOUNTER — Telehealth: Payer: Self-pay | Admitting: *Deleted

## 2014-04-15 NOTE — Telephone Encounter (Signed)
This RN spoke with Jamie [name] per results of xrays of legs due to noted pain.  Informed pt area of disease in right leg which corresponds with described discomfort.  Per discussion this RN inquired about pain control with Jamie [name] stating she is currently using tylenol " at least 2 tabs 3 times a day - sometimes 4 times a day "  She has percocet in home " but when I take it- it makes me feel like I am going to have a panic attack and the clonazepam doesn't work "  Jamie [name] states she is able to ambulate and pain is variable- " some days I wake up and the pain is not bad- like today " " and then other days I get up - I can barely walk "  This RN discussed possible use of tramadol with Jamie [name] stating " Dr Jana Hakim gave me a prescription for that but I didn't ever take it because I have Menire's and I was afraid it could make me too dizzy "  Jamie [name] states she would like to " avoid radiation because I had such a terrible time the last time I got it "  Plan at present is for results to be reviewed by a provider and obtain appropriate recommendation for follow up.  Jamie [name] understands to call if needed.

## 2014-04-16 ENCOUNTER — Ambulatory Visit: Payer: Medicare HMO | Admitting: Endocrinology

## 2014-04-24 ENCOUNTER — Other Ambulatory Visit: Payer: Self-pay | Admitting: *Deleted

## 2014-04-24 MED ORDER — TRAMADOL HCL 50 MG PO TABS: 50.0000 mg | ORAL_TABLET | Freq: Four times a day (QID) | ORAL | Status: DC | PRN

## 2014-04-25 ENCOUNTER — Other Ambulatory Visit: Payer: Commercial Managed Care - HMO

## 2014-04-25 ENCOUNTER — Ambulatory Visit: Payer: Commercial Managed Care - HMO

## 2014-04-29 ENCOUNTER — Ambulatory Visit (HOSPITAL_BASED_OUTPATIENT_CLINIC_OR_DEPARTMENT_OTHER): Payer: Commercial Managed Care - HMO

## 2014-04-29 ENCOUNTER — Other Ambulatory Visit (HOSPITAL_BASED_OUTPATIENT_CLINIC_OR_DEPARTMENT_OTHER): Payer: Commercial Managed Care - HMO

## 2014-04-29 VITALS — BP 150/59 | HR 62 | Temp 97.7°F

## 2014-04-29 DIAGNOSIS — C50419 Malignant neoplasm of upper-outer quadrant of unspecified female breast: Secondary | ICD-10-CM

## 2014-04-29 DIAGNOSIS — C7951 Secondary malignant neoplasm of bone: Secondary | ICD-10-CM

## 2014-04-29 DIAGNOSIS — C50919 Malignant neoplasm of unspecified site of unspecified female breast: Secondary | ICD-10-CM

## 2014-04-29 DIAGNOSIS — M545 Low back pain, unspecified: Secondary | ICD-10-CM

## 2014-04-29 DIAGNOSIS — C7952 Secondary malignant neoplasm of bone marrow: Secondary | ICD-10-CM

## 2014-04-29 DIAGNOSIS — R0781 Pleurodynia: Secondary | ICD-10-CM

## 2014-04-29 DIAGNOSIS — M25551 Pain in right hip: Secondary | ICD-10-CM

## 2014-04-29 LAB — COMPREHENSIVE METABOLIC PANEL (CC13)
ALK PHOS: 78 U/L (ref 40–150)
ALT: 15 U/L (ref 0–55)
ANION GAP: 9 meq/L (ref 3–11)
AST: 23 U/L (ref 5–34)
Albumin: 4.1 g/dL (ref 3.5–5.0)
BILIRUBIN TOTAL: 0.28 mg/dL (ref 0.20–1.20)
BUN: 10.3 mg/dL (ref 7.0–26.0)
CO2: 29 meq/L (ref 22–29)
CREATININE: 0.8 mg/dL (ref 0.6–1.1)
Calcium: 10.2 mg/dL (ref 8.4–10.4)
Chloride: 102 mEq/L (ref 98–109)
GLUCOSE: 125 mg/dL (ref 70–140)
Potassium: 4 mEq/L (ref 3.5–5.1)
SODIUM: 140 meq/L (ref 136–145)
TOTAL PROTEIN: 7.6 g/dL (ref 6.4–8.3)

## 2014-04-29 LAB — CBC WITH DIFFERENTIAL/PLATELET
BASO%: 0.5 % (ref 0.0–2.0)
BASOS ABS: 0 10*3/uL (ref 0.0–0.1)
EOS ABS: 0.1 10*3/uL (ref 0.0–0.5)
EOS%: 1.2 % (ref 0.0–7.0)
HEMATOCRIT: 39.3 % (ref 34.8–46.6)
HGB: 13.4 g/dL (ref 11.6–15.9)
LYMPH%: 28.3 % (ref 14.0–49.7)
MCH: 32.8 pg (ref 25.1–34.0)
MCHC: 34 g/dL (ref 31.5–36.0)
MCV: 96.4 fL (ref 79.5–101.0)
MONO#: 0.4 10*3/uL (ref 0.1–0.9)
MONO%: 7.9 % (ref 0.0–14.0)
NEUT%: 62.1 % (ref 38.4–76.8)
NEUTROS ABS: 3.5 10*3/uL (ref 1.5–6.5)
PLATELETS: 232 10*3/uL (ref 145–400)
RBC: 4.08 10*6/uL (ref 3.70–5.45)
RDW: 13.6 % (ref 11.2–14.5)
WBC: 5.6 10*3/uL (ref 3.9–10.3)
lymph#: 1.6 10*3/uL (ref 0.9–3.3)

## 2014-04-29 MED ORDER — DENOSUMAB 120 MG/1.7ML ~~LOC~~ SOLN
120.0000 mg | Freq: Once | SUBCUTANEOUS | Status: AC
  Administered 2014-04-29: 120 mg via SUBCUTANEOUS
  Filled 2014-04-29: qty 1.7

## 2014-04-29 NOTE — Patient Instructions (Signed)
Denosumab injection What is this medicine? DENOSUMAB (den oh sue mab) slows bone breakdown. Prolia is used to treat osteoporosis in women after menopause and in men. Xgeva is used to prevent bone fractures and other bone problems caused by cancer bone metastases. Xgeva is also used to treat giant cell tumor of the bone. This medicine may be used for other purposes; ask your health care provider or pharmacist if you have questions. COMMON BRAND NAME(S): Prolia, XGEVA What should I tell my health care provider before I take this medicine? They need to know if you have any of these conditions: -dental disease -eczema -infection or history of infections -kidney disease or on dialysis -low blood calcium or vitamin D -malabsorption syndrome -scheduled to have surgery or tooth extraction -taking medicine that contains denosumab -thyroid or parathyroid disease -an unusual reaction to denosumab, other medicines, foods, dyes, or preservatives -pregnant or trying to get pregnant -breast-feeding How should I use this medicine? This medicine is for injection under the skin. It is given by a health care professional in a hospital or clinic setting. If you are getting Prolia, a special MedGuide will be given to you by the pharmacist with each prescription and refill. Be sure to read this information carefully each time. For Prolia, talk to your pediatrician regarding the use of this medicine in children. Special care may be needed. For Xgeva, talk to your pediatrician regarding the use of this medicine in children. While this drug may be prescribed for children as young as 13 years for selected conditions, precautions do apply. Overdosage: If you think you've taken too much of this medicine contact a poison control center or emergency room at once. Overdosage: If you think you have taken too much of this medicine contact a poison control center or emergency room at once. NOTE: This medicine is only for  you. Do not share this medicine with others. What if I miss a dose? It is important not to miss your dose. Call your doctor or health care professional if you are unable to keep an appointment. What may interact with this medicine? Do not take this medicine with any of the following medications: -other medicines containing denosumab This medicine may also interact with the following medications: -medicines that suppress the immune system -medicines that treat cancer -steroid medicines like prednisone or cortisone This list may not describe all possible interactions. Give your health care provider a list of all the medicines, herbs, non-prescription drugs, or dietary supplements you use. Also tell them if you smoke, drink alcohol, or use illegal drugs. Some items may interact with your medicine. What should I watch for while using this medicine? Visit your doctor or health care professional for regular checks on your progress. Your doctor or health care professional may order blood tests and other tests to see how you are doing. Call your doctor or health care professional if you get a cold or other infection while receiving this medicine. Do not treat yourself. This medicine may decrease your body's ability to fight infection. You should make sure you get enough calcium and vitamin D while you are taking this medicine, unless your doctor tells you not to. Discuss the foods you eat and the vitamins you take with your health care professional. See your dentist regularly. Brush and floss your teeth as directed. Before you have any dental work done, tell your dentist you are receiving this medicine. Do not become pregnant while taking this medicine or for 5 months after stopping   it. Women should inform their doctor if they wish to become pregnant or think they might be pregnant. There is a potential for serious side effects to an unborn child. Talk to your health care professional or pharmacist for more  information. What side effects may I notice from receiving this medicine? Side effects that you should report to your doctor or health care professional as soon as possible: -allergic reactions like skin rash, itching or hives, swelling of the face, lips, or tongue -breathing problems -chest pain -fast, irregular heartbeat -feeling faint or lightheaded, falls -fever, chills, or any other sign of infection -muscle spasms, tightening, or twitches -numbness or tingling -skin blisters or bumps, or is dry, peels, or red -slow healing or unexplained pain in the mouth or jaw -unusual bleeding or bruising Side effects that usually do not require medical attention (Report these to your doctor or health care professional if they continue or are bothersome.): -muscle pain -stomach upset, gas This list may not describe all possible side effects. Call your doctor for medical advice about side effects. You may report side effects to FDA at 1-800-FDA-1088. Where should I keep my medicine? This medicine is only given in a clinic, doctor's office, or other health care setting and will not be stored at home. NOTE: This sheet is a summary. It may not cover all possible information. If you have questions about this medicine, talk to your doctor, pharmacist, or health care provider.  2015, Elsevier/Gold Standard. (2012-04-03 12:37:47)  

## 2014-05-21 ENCOUNTER — Other Ambulatory Visit: Payer: Self-pay

## 2014-05-23 ENCOUNTER — Ambulatory Visit: Payer: Commercial Managed Care - HMO

## 2014-05-23 ENCOUNTER — Telehealth: Payer: Self-pay | Admitting: Oncology

## 2014-05-23 ENCOUNTER — Other Ambulatory Visit (HOSPITAL_BASED_OUTPATIENT_CLINIC_OR_DEPARTMENT_OTHER): Payer: Commercial Managed Care - HMO

## 2014-05-23 ENCOUNTER — Ambulatory Visit (HOSPITAL_BASED_OUTPATIENT_CLINIC_OR_DEPARTMENT_OTHER): Payer: Commercial Managed Care - HMO | Admitting: Oncology

## 2014-05-23 VITALS — BP 155/50 | HR 66 | Temp 97.8°F | Resp 18 | Ht 63.0 in | Wt 147.9 lb

## 2014-05-23 DIAGNOSIS — C7951 Secondary malignant neoplasm of bone: Secondary | ICD-10-CM

## 2014-05-23 DIAGNOSIS — C50419 Malignant neoplasm of upper-outer quadrant of unspecified female breast: Secondary | ICD-10-CM

## 2014-05-23 DIAGNOSIS — C50919 Malignant neoplasm of unspecified site of unspecified female breast: Secondary | ICD-10-CM

## 2014-05-23 DIAGNOSIS — I10 Essential (primary) hypertension: Secondary | ICD-10-CM

## 2014-05-23 DIAGNOSIS — M797 Fibromyalgia: Secondary | ICD-10-CM

## 2014-05-23 DIAGNOSIS — M545 Low back pain, unspecified: Secondary | ICD-10-CM

## 2014-05-23 DIAGNOSIS — M25551 Pain in right hip: Secondary | ICD-10-CM

## 2014-05-23 DIAGNOSIS — C7952 Secondary malignant neoplasm of bone marrow: Secondary | ICD-10-CM

## 2014-05-23 DIAGNOSIS — R0781 Pleurodynia: Secondary | ICD-10-CM

## 2014-05-23 DIAGNOSIS — F329 Major depressive disorder, single episode, unspecified: Secondary | ICD-10-CM

## 2014-05-23 DIAGNOSIS — Z170 Estrogen receptor positive status [ER+]: Secondary | ICD-10-CM

## 2014-05-23 DIAGNOSIS — F3289 Other specified depressive episodes: Secondary | ICD-10-CM

## 2014-05-23 LAB — CBC WITH DIFFERENTIAL/PLATELET
BASO%: 0.8 % (ref 0.0–2.0)
Basophils Absolute: 0 10*3/uL (ref 0.0–0.1)
EOS ABS: 0.1 10*3/uL (ref 0.0–0.5)
EOS%: 1.3 % (ref 0.0–7.0)
HCT: 37.1 % (ref 34.8–46.6)
HGB: 12.6 g/dL (ref 11.6–15.9)
LYMPH#: 1.6 10*3/uL (ref 0.9–3.3)
LYMPH%: 30.1 % (ref 14.0–49.7)
MCH: 32.8 pg (ref 25.1–34.0)
MCHC: 33.9 g/dL (ref 31.5–36.0)
MCV: 96.5 fL (ref 79.5–101.0)
MONO#: 0.4 10*3/uL (ref 0.1–0.9)
MONO%: 8.4 % (ref 0.0–14.0)
NEUT#: 3.1 10*3/uL (ref 1.5–6.5)
NEUT%: 59.4 % (ref 38.4–76.8)
Platelets: 240 10*3/uL (ref 145–400)
RBC: 3.85 10*6/uL (ref 3.70–5.45)
RDW: 13.6 % (ref 11.2–14.5)
WBC: 5.3 10*3/uL (ref 3.9–10.3)

## 2014-05-23 LAB — COMPREHENSIVE METABOLIC PANEL (CC13)
ALBUMIN: 3.9 g/dL (ref 3.5–5.0)
ALT: 16 U/L (ref 0–55)
ANION GAP: 8 meq/L (ref 3–11)
AST: 26 U/L (ref 5–34)
Alkaline Phosphatase: 76 U/L (ref 40–150)
BUN: 14.5 mg/dL (ref 7.0–26.0)
CALCIUM: 10.1 mg/dL (ref 8.4–10.4)
CO2: 31 meq/L — AB (ref 22–29)
CREATININE: 0.8 mg/dL (ref 0.6–1.1)
Chloride: 101 mEq/L (ref 98–109)
Glucose: 125 mg/dl (ref 70–140)
POTASSIUM: 3.9 meq/L (ref 3.5–5.1)
Sodium: 140 mEq/L (ref 136–145)
Total Bilirubin: 0.35 mg/dL (ref 0.20–1.20)
Total Protein: 7.3 g/dL (ref 6.4–8.3)

## 2014-05-23 MED ORDER — ZOLEDRONIC ACID 4 MG/100ML IV SOLN
4.0000 mg | Freq: Once | INTRAVENOUS | Status: AC
  Administered 2014-05-23: 4 mg via INTRAVENOUS
  Filled 2014-05-23: qty 100
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
## 2014-05-23 NOTE — Patient Instructions (Signed)

## 2014-05-23 NOTE — Telephone Encounter (Signed)
per pof to sch pt appt-[facility] sch trmt-gave pt copy of sch

## 2014-05-23 NOTE — Addendum Note (Signed)
Addended by: Amelia Jo I on: 05/23/2014 01:52 PM   Modules accepted: Medications

## 2014-05-24 ENCOUNTER — Other Ambulatory Visit: Payer: Self-pay

## 2014-05-24 DIAGNOSIS — Z1231 Encounter for screening mammogram for malignant neoplasm of breast: Secondary | ICD-10-CM

## 2014-05-24 DIAGNOSIS — Z853 Personal history of malignant neoplasm of breast: Secondary | ICD-10-CM

## 2014-05-24 LAB — CANCER ANTIGEN 27.29: CA 27.29: 115 U/mL — AB (ref 0–39)

## 2014-05-27 ENCOUNTER — Ambulatory Visit: Payer: Commercial Managed Care - HMO

## 2014-05-29 ENCOUNTER — Telehealth: Payer: Self-pay

## 2014-05-29 NOTE — Telephone Encounter (Signed)
Office note rcvd by fax from Dr. Wynonia Lawman dtd 05/28/14.  Copy to [facility].  Original to scan.

## 2014-05-31 ENCOUNTER — Encounter: Payer: Self-pay | Admitting: *Deleted

## 2014-05-31 NOTE — Progress Notes (Signed)
[place] Work  Clinical Social Work called pt to check in after seeing her in the lobby the other day for follow up. Pt continues to struggle with challenging family dynamics. Pt needed to share these challenges and others today. CSW has referred pt to additional supports for additional counseling, but pt has declined these other options currently. Pt also continues to have financial concerns due to medical bills and car repairs that are stressful. CSW has provided pt with many resources to assist and she has used up her grant funds. Pt is feeling better overall and has found pleasure in sitting on the porch and housework. Pt appreciated call and is aware to reach out to CSW as needed.   Clinical Social Work interventions: Solution focused discussion Emotional support  Loren Racer, LCSW Clinical Social Worker [facility] for [place] Wednesday, Thursday and Friday Phone: [phone] Fax: [phone]

## 2014-06-20 ENCOUNTER — Ambulatory Visit: Payer: Commercial Managed Care - HMO

## 2014-06-20 ENCOUNTER — Other Ambulatory Visit: Payer: Commercial Managed Care - HMO

## 2014-06-27 ENCOUNTER — Ambulatory Visit: Payer: Commercial Managed Care - HMO

## 2014-07-10 ENCOUNTER — Ambulatory Visit
Admission: RE | Admit: 2014-07-10 | Discharge: 2014-07-10 | Disposition: A | Discharge status: Home / Self Care | Payer: Commercial Managed Care - HMO | Source: Ambulatory Visit

## 2014-07-10 ENCOUNTER — Other Ambulatory Visit: Payer: Self-pay

## 2014-07-10 ENCOUNTER — Telehealth: Payer: Self-pay | Admitting: Emergency Medicine

## 2014-07-10 DIAGNOSIS — Z1231 Encounter for screening mammogram for malignant neoplasm of breast: Secondary | ICD-10-CM

## 2014-07-10 DIAGNOSIS — Z9012 Acquired absence of left breast and nipple: Secondary | ICD-10-CM

## 2014-07-10 NOTE — Telephone Encounter (Signed)
Patient called stating that she has been experiencing some "stomach trouble" that has went to her "side" and now seems to be in her "back".   Patient states that the [facility] ins nurse called her for a well check and advised patient to notify her doctor of this asap.   Patient to see Dr Jana Hakim for followup on 10/29.

## 2014-07-12 ENCOUNTER — Telehealth: Payer: Self-pay | Admitting: *Deleted

## 2014-07-12 NOTE — Telephone Encounter (Signed)
This RN received note from nurse requesting a return call to pt " she has called twice".  This RN attempted to contact pt at [phone] with number busy x 3 tries. Call placed to cell number with recording stating this number has been D/Ced.  This RN will attempt to contact pt.  Upon later attempt this RN was able to speak with pt per her call on 9/23 due to new pain concern.  Per this RN's inquiry Jamie [name] states onset " sick for about 3 weeks ".  " sickness" started with upset stomach with diarrhea which she saw Dr Virgina Jock for, "but the pain kinda moves "  Per pt she states he " did xrays and told me there was no blockage and told me to eat a bland diet and increase my fluids "  " pain then went to my side and now it is in my side and back "  Description is " grabbing " " makes me catch my breath ".  Pain is not constant and occurs " when I move in certain positions like getting in the car and when I get out of bed ".  Overall bowel and bladder habits are unchanged except for above episode.  Jamie [name] denies any leg numbness or tingling " got a few cramps in my feet ".  Per discussion Jamie [name] states she is scheduled for a bone scan in late October and is wondering if it should be moved up.  This RN was able to obtain appointment for bone scan 10/1 ( previously scheduled 10/20 ).  Above will be reviewed with MD for additional orders and recommendations.

## 2014-07-16 ENCOUNTER — Telehealth: Payer: Self-pay | Admitting: *Deleted

## 2014-07-16 NOTE — Telephone Encounter (Signed)
This RN spoke with pt per scheduled bone scan and appointment for follow up.

## 2014-07-18 ENCOUNTER — Encounter (HOSPITAL_COMMUNITY)
Admission: RE | Admit: 2014-07-18 | Discharge: 2014-07-18 | Disposition: A | Discharge status: Home / Self Care | Payer: Medicare HMO | Source: Ambulatory Visit | Attending: Oncology | Admitting: Oncology

## 2014-07-18 ENCOUNTER — Other Ambulatory Visit: Payer: Commercial Managed Care - HMO

## 2014-07-18 ENCOUNTER — Encounter (HOSPITAL_COMMUNITY): Payer: Self-pay

## 2014-07-18 ENCOUNTER — Ambulatory Visit: Payer: Commercial Managed Care - HMO

## 2014-07-18 DIAGNOSIS — R0781 Pleurodynia: Secondary | ICD-10-CM | POA: Insufficient documentation

## 2014-07-18 DIAGNOSIS — F329 Major depressive disorder, single episode, unspecified: Secondary | ICD-10-CM | POA: Insufficient documentation

## 2014-07-18 DIAGNOSIS — C50919 Malignant neoplasm of unspecified site of unspecified female breast: Secondary | ICD-10-CM | POA: Diagnosis present

## 2014-07-18 DIAGNOSIS — I10 Essential (primary) hypertension: Secondary | ICD-10-CM | POA: Diagnosis present

## 2014-07-18 DIAGNOSIS — C7951 Secondary malignant neoplasm of bone: Secondary | ICD-10-CM | POA: Insufficient documentation

## 2014-07-18 DIAGNOSIS — M797 Fibromyalgia: Secondary | ICD-10-CM | POA: Diagnosis present

## 2014-07-18 MED ORDER — TECHNETIUM TC 99M MEDRONATE IV KIT
24.9000 | PACK | Freq: Once | INTRAVENOUS | Status: AC | PRN
  Administered 2014-07-18: 24.9 via INTRAVENOUS

## 2014-07-24 ENCOUNTER — Telehealth: Payer: Self-pay | Admitting: *Deleted

## 2014-07-24 ENCOUNTER — Telehealth: Payer: Self-pay | Admitting: Oncology

## 2014-07-24 ENCOUNTER — Ambulatory Visit (HOSPITAL_BASED_OUTPATIENT_CLINIC_OR_DEPARTMENT_OTHER): Payer: Commercial Managed Care - HMO | Admitting: Oncology

## 2014-07-24 VITALS — BP 138/50 | HR 68 | Temp 97.5°F | Resp 18 | Ht 63.0 in | Wt 146.4 lb

## 2014-07-24 DIAGNOSIS — C50212 Malignant neoplasm of upper-inner quadrant of left female breast: Secondary | ICD-10-CM

## 2014-07-24 DIAGNOSIS — C7951 Secondary malignant neoplasm of bone: Secondary | ICD-10-CM

## 2014-07-24 DIAGNOSIS — M797 Fibromyalgia: Secondary | ICD-10-CM

## 2014-07-24 DIAGNOSIS — Z170 Estrogen receptor positive status [ER+]: Secondary | ICD-10-CM

## 2014-07-24 DIAGNOSIS — C50919 Malignant neoplasm of unspecified site of unspecified female breast: Secondary | ICD-10-CM

## 2014-07-24 DIAGNOSIS — F411 Generalized anxiety disorder: Secondary | ICD-10-CM

## 2014-07-24 DIAGNOSIS — K589 Irritable bowel syndrome without diarrhea: Secondary | ICD-10-CM

## 2014-07-24 DIAGNOSIS — C50412 Malignant neoplasm of upper-outer quadrant of left female breast: Secondary | ICD-10-CM

## 2014-07-24 NOTE — Telephone Encounter (Signed)
Per staff message and POF I have scheduled appts. Advised scheduler of appts. JMW  

## 2014-07-24 NOTE — Progress Notes (Signed)
ID: Jamie [name]   DOB: [birth year]  MR#: [id number]  CVE#:[id number]  PCP: [name], MD GYN: Everlene Farrier, MD SU: OTHER MD: Renato Shin, MD;  Delfin Edis, MD, Janna Arch DDS  CHIEF COMPLAINT:  Metastatic Breast Cancer CURRENT THERAPY: Letrozole, zolendronate   BREAST CANCER HISTORY: From the prior intake note:  The patient's cancer was uncovered by an unusually circuitous route. She presented with stomach upset.  Because of a history of colitis, Dr. Teena Irani who happened to be on call for Dr. Cristina Gong, obtained CT of the abdomen and pelvis on August 30, 2005.  As far as the abdomen and pelvis were concerned, there was no evidence of colitis but there were multiple scattered sclerotic lesions in the bone suggesting possible sclerotic metastatic disease (versus osteopoikilosis.)  Accordingly a bone scan was obtained.  This was done on September 01, 2005, and found tiny sclerotic lesions, and in particular a lesion of concern in the sternum.  Accordingly a CT scan of the chest was obtained.  This found the sternal problem to correspond with degenerative changes. However, it also showed a soft tissue nodule in the left breast.  The patient then had mammograms on September 27, 2005.  This found two areas of spiculation and architectural distortion in the upper outer quadrant of the left breast.  By ultrasound these were hyperechoic and irregular, highly suspicious for carcinoma.  Biopsy was obtained the same day and showed ([id number], as well as [phone]) invasive lobular carcinoma, with both lesions biopsied being ER and PR positive, both herceptest positive, both negative by FISH.  Accordingly the patient was referred to Dr. Georgette Dover and breast MRI was obtained October 06, 2006.  No abnormal enhancement was noted in the right breast.  There was enhancing nodularity in the upper portion of the left breast where several discrete nodules were seen.  Accordingly Dr. Georgette Dover performed a  left modified radical mastectomy on November 02, 2005.  The patient had a positive sentinel lymph node and Dr. Georgette Dover performed a left axillary lymph node dissection.  However, only three additional lymph nodes were recovered, two of which also showed metastatic involvement (all had extra capsular extension.)    The patient's subsequent history is as detailed below  INTERVAL HISTORY: "Jamie [name]" returns today for followup of her metastatic breast carcinoma. We have scheduled a visit for later this month when she receives her next dose of zolendronate, but she called saying that she was having more back pain and we set her up for a bone scan looking for disease progression. This was performed 07/18/2014 and was entirely stable. In the interim she also had her mammography, which was also benign.  REVIEW OF SYSTEMS: "Jamie [name]" tells me that the pain she was having started in her back and then moved to her left hip. It lasted about a week or 10 days and then moved to a right hip. Currently she doesn't have pain in any of those places although she does have pains here in there all the time and because of her history of fibromyalgia. She continues to feel anxious and depressed, sleeps poorly, gets nighttime cramps, has sinus problems and a little bit of a running nose, but overall a detailed review of systems today was entirely stable. She does not exercise regularly but does do all her housework without any difficulties. She does not do any yard work.  PAST MEDICAL HISTORY: Past Medical History  Diagnosis Date  . Anxiety   . Asthma   .  Breast cancer     metastasized  . Depression   . Diabetes mellitus   . GERD (gastroesophageal reflux disease)   . Hyperlipidemia   . Internal hemorrhoids   . IBS (irritable bowel syndrome)   . Mitral valve prolapse   . Ischemic colitis   . Fibromyalgia   . Allergy   . Cataract   . S/P radiation therapy within four to twelve weeks 04/26/13-05/11/13    L-spine/sacrum 30Gy/18fx   . Colon polyp Jan 06, 2012    TUBULAR ADENOMA  Significant for diabetes, hypercholesterolemia, asthma, mitral valve prolapse requiring antibiotic prophylaxis before any invasive procedures, Meniere's disease, gastroesophageal reflux disease, osteoarthritis, degenerative disk disease, history of fibromyalgia, panic attacks, history of synovial cyst removal, history of total hysterectomy with bilateral salpingo-oophorectomy, history of septoplasty x2, status post appendectomy, status post dilatation and curettage remotely, status post bilateral carpal tunnel release under Dr. Clair Gulling [place].  PAST SURGICAL HISTORY: Past Surgical History  Procedure Laterality Date  . Mastectomy Left     then treated with chemo and radiation  . Total abdominal hysterectomy    . Back surgery      synovial cyst removed from spine  . Carpal tunnel release Bilateral   . Dilation and curettage of uterus    . Appendectomy    . Cataract extraction Bilateral   . Colonoscopy w/ biopsies  06-Jan-2012  . Esophagogastroduodenoscopy  2007/01/06    normal    FAMILY HISTORY The patient's father died at the age of 13 from a stroke.  The patient's mother died at the age of 67, also from a stroke.  The patient has three brothers; one died with "liver disease."  One has prostate cancer.  The other brother has prostate and colon cancer.  The only breast cancer in the family was the patient's mother's mother and she does not know how old her grandmother was when she was diagnosed.  There is no history of ovarian cancer in the family.  GYNECOLOGIC HISTORY: She is GXP2.  She had her hysterectomy while still menstruating and she took hormone replacement until December 2006, when she had her breast biopsy.  SOCIAL HISTORY: (Updated 02/01/2014) She used to work as a [employer]ar[employer] manager.  Her husband Laverna Peace (who is the son of my former patient Kessie Croston) is semiretired, working for a [employer].  What he likes to do is hunt and fish.  Their son Chrissie Noa had  significant muscular dystrophy and died in 2007-01-06. Their daughter Marcie Bal lives in [place] and works in an office.  The patient has two granddaughters, both RNs, one working at [employer] and the other in [place].    ADVANCED DIRECTIVES:  HEALTH MAINTENANCE:  (Updated 02/01/2014) History  Substance Use Topics  . Smoking status: Never Smoker   . Smokeless tobacco: Never Used  . Alcohol Use: No     Colonoscopy:  July 2013, Dr. Olevia Perches  PAP: s/p hysterectomy  Bone density:  November 2014 at [facility], normal  Lipid panel: Dr. Virgina Jock    Allergies  Allergen Reactions  . Hydrocodone Anxiety    Pt. Said she is fine with this medication  . Oxycodone Anxiety  . Metformin And Related     GI sxs  . Morphine And Related   . Niacin Other (See Comments)    Dizziness   . Promethazine Hcl Other (See Comments)    Dizziness "FELT CRAZY"     Current Outpatient Prescriptions  Medication Sig Dispense Refill  . acetaminophen (TYLENOL) 500  MG tablet Take 500-1,000 mg by mouth every 6 (six) hours as needed for mild pain or moderate pain.       . beclomethasone (QVAR) 40 MCG/ACT inhaler Inhale 2 puffs into the lungs 2 (two) times daily.       Marland Kitchen BYSTOLIC 5 MG tablet Take 1 tablet by mouth Daily.      . Cholecalciferol (VITAMIN D) 1000 UNITS capsule Take 1,000-2,000 Units by mouth 2 (two) times daily. 2 in am, 1 at night      . clonazePAM (KLONOPIN) 0.5 MG tablet Take 0.5 mg by mouth 3 (three) times daily as needed for anxiety.       . CRESTOR 5 MG tablet Take 1 tablet by mouth Daily.      Marland Kitchen dicyclomine (BENTYL) 10 MG capsule Take 10 mg by mouth every morning.      Marland Kitchen glimepiride (AMARYL) 1 MG tablet Take 1 mg by mouth daily.       Marland Kitchen GLYCERIN ADULT 2 G suppository       . hydrochlorothiazide (HYDRODIURIL) 25 MG tablet Take 1/4 tablet by mouth three time per week      . hydrocortisone (ANUSOL-HC) 2.5 % rectal cream Place rectally daily as needed.      Marland Kitchen letrozole (FEMARA) 2.5 MG tablet  Take 1 tablet (2.5 mg total) by mouth daily.  90 tablet  4  . lidocaine-hydrocortisone (ANAMANTEL HC) 3-0.5 % CREA Place 1 Applicatorful rectally 2 (two) times daily as needed for hemorrhoids and bleeding.  28.35 g  0  . meclizine (ANTIVERT) 25 MG tablet Take 25 mg by mouth as needed for dizziness.       . metoCLOPramide (REGLAN) 5 MG tablet Take 1 tablet (5 mg total) by mouth every 8 (eight) hours as needed for nausea.  30 tablet  1  . mupirocin ointment (BACTROBAN) 2 % Place 1 application into the nose daily.       Marland Kitchen NEXIUM 40 MG capsule Take 1 tablet by mouth Daily.      . ONGLYZA 5 MG TABS tablet Take 5 mg by mouth daily.       Vladimir Faster Glycol-Propyl Glycol (SYSTANE OP) Apply 1-2 drops to eye as needed (dry eyes).      Marland Kitchen PROAIR HFA 108 (90 BASE) MCG/ACT inhaler Inhale 1-2 puffs into the lungs every 6 (six) hours as needed.       . sucralfate (CARAFATE) 1 G tablet Take 1 g by mouth 4 (four) times daily.      . traMADol (ULTRAM) 50 MG tablet Take 1 tablet (50 mg total) by mouth every 6 (six) hours as needed.  60 tablet  1   No current facility-administered medications for this visit.    OBJECTIVE: Middle-aged white woman in no acute distress Filed Vitals:   07/24/14 0817  BP: 138/50  Pulse: 68  Temp: 97.5 F (36.4 C)  Resp: 18     Body mass index is 25.94 kg/(m^2).    ECOG FS: 1 Filed Weights   07/24/14 0817  Weight: 146 lb 6.4 oz (66.407 kg)   Sclerae unicteric, pupils round and equal Oropharynx clear, dentition in good repair No cervical or supraclavicular adenopathy Lungs no rales or rhonchi Heart regular rate and rhythm Abd soft, nontender, positive bowel sounds MSK no focal spinal tenderness, no upper extremity lymphedema Neuro: nonfocal, well oriented, appropriate affect  Breasts: The right breast is unremarkable. The left breast is status post mastectomy. There are telangiectasias secondary to the prior  radiation. There is no evidence of local recurrence. There is no  pain to palpation. The left axilla is benign.   LAB RESULTS: Lab Results  Component Value Date   WBC 5.3 05/23/2014   NEUTROABS 3.1 05/23/2014   HGB 12.6 05/23/2014   HCT 37.1 05/23/2014   MCV 96.5 05/23/2014   PLT 240 05/23/2014      Chemistry      Component Value Date/Time   NA 140 05/23/2014 1132   NA 140 01/01/2014 1617   K 3.9 05/23/2014 1132   K 5.2 01/01/2014 1617   CL 102 01/01/2014 1617   CL 103 03/22/2013 0754   CO2 31* 05/23/2014 1132   CO2 26 01/01/2014 1610   BUN 14.5 05/23/2014 1132   BUN 15 01/01/2014 1617   CREATININE 0.8 05/23/2014 1132   CREATININE 0.60 01/01/2014 1617      Component Value Date/Time   CALCIUM 10.1 05/23/2014 1132   CALCIUM 9.3 01/01/2014 1610   ALKPHOS 76 05/23/2014 1132   ALKPHOS 74 01/01/2014 1610   AST 26 05/23/2014 1132   AST 38* 01/01/2014 1610   ALT 16 05/23/2014 1132   ALT 19 01/01/2014 1610   BILITOT 0.35 05/23/2014 1132   BILITOT 0.3 01/01/2014 1610       STUDIES: Nm Bone Scan Whole Body  07/18/2014   CLINICAL DATA:  Metastatic breast cancer. Back and hip pain. No recent trauma.  EXAM: NUCLEAR MEDICINE WHOLE BODY BONE SCAN  TECHNIQUE: Whole body anterior and posterior images were obtained approximately 3 hours after intravenous injection of radiopharmaceutical.  RADIOPHARMACEUTICALS:  24.9 mCi Technetium-99 MDP  COMPARISON:  Prior bone scans dated 02/25/2014 and 10/15/2013  FINDINGS: There is persistent multifocal low level activity throughout the spine which is generally stable. The dominant activity noted in the upper lumbar spine on the most recent study has improved. The activity posteriorly in the right ninth rib has improved. There is slightly increased activity within the contralateral left ninth rib. Activity in the mid sternum and pelvis appears stable. No new activity is seen within the appendicular skeleton. Scattered degenerative activity is present. There is increased activity within the right renal pelvis which appears mildly distended.  IMPRESSION: 1. No  evidence of progressive metastatic disease. There is multilevel low-level activity throughout the spine, ribs and pelvis corresponding with known chronic osseous metastatic disease. Activity in the upper lumbar spine has improved. 2. Progressive mild dilatation of the right renal pelvis, potentially physiologic. No ureteral obstruction demonstrated on CT performed 4 months ago.   Electronically Signed   By: Camie Patience [name].   On: 07/18/2014 10:55   Mm Screening Breast Tomo Uni R  07/12/2014   CLINICAL DATA:  Screening.  EXAM: DIGITAL SCREENING UNILATERAL RIGHT MAMMOGRAM WITH TOMO AND CAD  COMPARISON:  Previous exam(s).  ACR Breast Density Category c: The breast tissue is heterogeneously dense, which may obscure small masses.  FINDINGS: There are no findings suspicious for malignancy. Images were processed with CAD.  IMPRESSION: No mammographic evidence of malignancy. A result letter of this screening mammogram will be mailed directly to the patient.  RECOMMENDATION: Screening mammogram in one year. (Code:[id number]-B-01Y)  BI-RADS CATEGORY  1: Negative.   Electronically Signed   By: Lovey Newcomer [name].   On: 07/12/2014 13:51    ASSESSMENT:77 y.o.  [place] woman    (1) status post left modified radical mastectomy in January 2007 for a T1c N1, stage IIA  invasive lobular carcinoma, grade 1, strongly estrogen and progesterone receptor  positive, HER-2 negative, with an MIB-1 of 7%.  (2)  Status post radiation given concurrently with Capecitabine.  Declined chemotherapy.    (3) Status post anastrozole and exemestane, both discontinued due to aches and pains.  Status post tamoxifen between October 2007 until August 2011, discontinued secondary to cramps   (4) multiple sclerotic bony lesions noted at initial diagnosis, biopsied x2 August 2009 without malignancy documented, on zoledronic acid annually starting 12/12/2007. 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  (8) received zoledronic acid monthly basis beginning September 2014, after May 2015 switched to denosumab, switched back to zolendronate 05/23/2014 to be given every 12 weeks (next dose due OCT 29)  PLAN:  Jamie [name] is very stable clinically and luckily the back pain she reported was a false alarm. We're going to continue the zolendronate every 3 months, which is the current standard and she will continue to see her dentist regularly. She is tolerating the letrozole well, and the plan is to continue that until there is evidence of disease progression.  Her next zolendronate dose will be October 29. The one after that late January. She will see me with the January dose and we will continue to follow her labwork on an every 3 month basis. I don't intend to repeat a bone scan unless there are new symptoms or otherwise yearly.  Jamie [name] has a good understanding of the overall plan. She agrees with it. She knows a goal of treatment in her case is control. She will call with any problems that may develop before her next visit here.Marland Kitchen      Chauncey Cruel, MD     07/24/2014

## 2014-07-24 NOTE — Telephone Encounter (Signed)
per pof to sch pt appt-gave pt copy of sch-sent MW emailt ot sch trmt

## 2014-08-06 ENCOUNTER — Ambulatory Visit (HOSPITAL_COMMUNITY): Payer: Medicare HMO

## 2014-08-06 ENCOUNTER — Encounter (HOSPITAL_COMMUNITY): Payer: Medicare HMO

## 2014-08-15 ENCOUNTER — Ambulatory Visit: Payer: Commercial Managed Care - HMO

## 2014-08-15 ENCOUNTER — Ambulatory Visit: Payer: Commercial Managed Care - HMO | Admitting: Oncology

## 2014-08-15 ENCOUNTER — Other Ambulatory Visit (HOSPITAL_BASED_OUTPATIENT_CLINIC_OR_DEPARTMENT_OTHER): Payer: Commercial Managed Care - HMO

## 2014-08-15 ENCOUNTER — Other Ambulatory Visit: Payer: Self-pay | Admitting: *Deleted

## 2014-08-15 ENCOUNTER — Ambulatory Visit (HOSPITAL_BASED_OUTPATIENT_CLINIC_OR_DEPARTMENT_OTHER): Payer: Commercial Managed Care - HMO

## 2014-08-15 ENCOUNTER — Other Ambulatory Visit: Payer: Commercial Managed Care - HMO

## 2014-08-15 VITALS — BP 143/51 | HR 72 | Temp 98.0°F

## 2014-08-15 DIAGNOSIS — C50919 Malignant neoplasm of unspecified site of unspecified female breast: Secondary | ICD-10-CM

## 2014-08-15 DIAGNOSIS — C50412 Malignant neoplasm of upper-outer quadrant of left female breast: Secondary | ICD-10-CM

## 2014-08-15 DIAGNOSIS — C7951 Secondary malignant neoplasm of bone: Principal | ICD-10-CM

## 2014-08-15 DIAGNOSIS — M545 Low back pain, unspecified: Secondary | ICD-10-CM

## 2014-08-15 DIAGNOSIS — R0781 Pleurodynia: Secondary | ICD-10-CM

## 2014-08-15 DIAGNOSIS — M25551 Pain in right hip: Secondary | ICD-10-CM

## 2014-08-15 LAB — CBC WITH DIFFERENTIAL/PLATELET
BASO%: 0.4 % (ref 0.0–2.0)
Basophils Absolute: 0 10*3/uL (ref 0.0–0.1)
EOS%: 3 % (ref 0.0–7.0)
Eosinophils Absolute: 0.2 10*3/uL (ref 0.0–0.5)
HCT: 37.1 % (ref 34.8–46.6)
HGB: 12.5 g/dL (ref 11.6–15.9)
LYMPH%: 38.4 % (ref 14.0–49.7)
MCH: 32.1 pg (ref 25.1–34.0)
MCHC: 33.7 g/dL (ref 31.5–36.0)
MCV: 95.4 fL (ref 79.5–101.0)
MONO#: 0.5 10*3/uL (ref 0.1–0.9)
MONO%: 7.4 % (ref 0.0–14.0)
NEUT#: 3.4 10*3/uL (ref 1.5–6.5)
NEUT%: 50.8 % (ref 38.4–76.8)
Platelets: 168 10*3/uL (ref 145–400)
RBC: 3.89 10*6/uL (ref 3.70–5.45)
RDW: 14 % (ref 11.2–14.5)
WBC: 6.8 10*3/uL (ref 3.9–10.3)
lymph#: 2.6 10*3/uL (ref 0.9–3.3)

## 2014-08-15 LAB — COMPREHENSIVE METABOLIC PANEL (CC13)
ALK PHOS: 98 U/L (ref 40–150)
ALT: 26 U/L (ref 0–55)
AST: 34 U/L (ref 5–34)
Albumin: 3.9 g/dL (ref 3.5–5.0)
Anion Gap: 9 mEq/L (ref 3–11)
BILIRUBIN TOTAL: 0.26 mg/dL (ref 0.20–1.20)
BUN: 13.7 mg/dL (ref 7.0–26.0)
CO2: 26 mEq/L (ref 22–29)
Calcium: 10 mg/dL (ref 8.4–10.4)
Chloride: 106 mEq/L (ref 98–109)
Creatinine: 0.8 mg/dL (ref 0.6–1.1)
Glucose: 123 mg/dl (ref 70–140)
POTASSIUM: 4 meq/L (ref 3.5–5.1)
SODIUM: 141 meq/L (ref 136–145)
TOTAL PROTEIN: 7.2 g/dL (ref 6.4–8.3)

## 2014-08-15 MED ORDER — ZOLEDRONIC ACID 4 MG/100ML IV SOLN
4.0000 mg | Freq: Once | INTRAVENOUS | Status: AC
  Administered 2014-08-15: 4 mg via INTRAVENOUS
  Filled 2014-08-15: qty 100

## 2014-08-15 NOTE — Patient Instructions (Signed)

## 2014-08-16 LAB — CANCER ANTIGEN 27.29: CA 27.29: 208 U/mL — ABNORMAL HIGH (ref 0–39)

## 2014-08-31 ENCOUNTER — Encounter (HOSPITAL_COMMUNITY): Payer: Self-pay | Admitting: Emergency Medicine

## 2014-08-31 ENCOUNTER — Emergency Department (HOSPITAL_COMMUNITY)
Admission: EM | Admit: 2014-08-31 | Discharge: 2014-08-31 | Disposition: A | Discharge status: Home / Self Care | Payer: Medicare HMO | Attending: Emergency Medicine | Admitting: Emergency Medicine

## 2014-08-31 ENCOUNTER — Emergency Department (HOSPITAL_COMMUNITY): Payer: Medicare HMO

## 2014-08-31 DIAGNOSIS — Z853 Personal history of malignant neoplasm of breast: Secondary | ICD-10-CM | POA: Diagnosis not present

## 2014-08-31 DIAGNOSIS — Z7951 Long term (current) use of inhaled steroids: Secondary | ICD-10-CM | POA: Insufficient documentation

## 2014-08-31 DIAGNOSIS — Z8679 Personal history of other diseases of the circulatory system: Secondary | ICD-10-CM | POA: Insufficient documentation

## 2014-08-31 DIAGNOSIS — R112 Nausea with vomiting, unspecified: Secondary | ICD-10-CM | POA: Diagnosis not present

## 2014-08-31 DIAGNOSIS — R42 Dizziness and giddiness: Secondary | ICD-10-CM | POA: Insufficient documentation

## 2014-08-31 DIAGNOSIS — Z79899 Other long term (current) drug therapy: Secondary | ICD-10-CM | POA: Insufficient documentation

## 2014-08-31 DIAGNOSIS — Z8739 Personal history of other diseases of the musculoskeletal system and connective tissue: Secondary | ICD-10-CM | POA: Diagnosis not present

## 2014-08-31 DIAGNOSIS — R0789 Other chest pain: Secondary | ICD-10-CM | POA: Insufficient documentation

## 2014-08-31 DIAGNOSIS — R197 Diarrhea, unspecified: Secondary | ICD-10-CM | POA: Diagnosis not present

## 2014-08-31 DIAGNOSIS — Z8669 Personal history of other diseases of the nervous system and sense organs: Secondary | ICD-10-CM | POA: Insufficient documentation

## 2014-08-31 DIAGNOSIS — R1084 Generalized abdominal pain: Secondary | ICD-10-CM | POA: Diagnosis present

## 2014-08-31 DIAGNOSIS — K219 Gastro-esophageal reflux disease without esophagitis: Secondary | ICD-10-CM | POA: Insufficient documentation

## 2014-08-31 DIAGNOSIS — J45909 Unspecified asthma, uncomplicated: Secondary | ICD-10-CM | POA: Diagnosis not present

## 2014-08-31 DIAGNOSIS — Z86010 Personal history of colonic polyps: Secondary | ICD-10-CM | POA: Insufficient documentation

## 2014-08-31 DIAGNOSIS — R0781 Pleurodynia: Secondary | ICD-10-CM

## 2014-08-31 DIAGNOSIS — E119 Type 2 diabetes mellitus without complications: Secondary | ICD-10-CM | POA: Diagnosis not present

## 2014-08-31 DIAGNOSIS — Z923 Personal history of irradiation: Secondary | ICD-10-CM | POA: Diagnosis not present

## 2014-08-31 LAB — COMPREHENSIVE METABOLIC PANEL
ALK PHOS: 92 U/L (ref 39–117)
ALT: 26 U/L (ref 0–35)
ANION GAP: 14 (ref 5–15)
AST: 40 U/L — ABNORMAL HIGH (ref 0–37)
Albumin: 3.6 g/dL (ref 3.5–5.2)
BUN: 12 mg/dL (ref 6–23)
CO2: 24 meq/L (ref 19–32)
Calcium: 9.3 mg/dL (ref 8.4–10.5)
Chloride: 105 mEq/L (ref 96–112)
Creatinine, Ser: 0.62 mg/dL (ref 0.50–1.10)
GFR calc Af Amer: >90 mL/min (ref >90)
GFR, EST NON AFRICAN AMERICAN: 86 mL/min — AB (ref >90)
GLUCOSE: 194 mg/dL — AB (ref 70–99)
POTASSIUM: 3.6 meq/L — AB (ref 3.7–5.3)
Sodium: 143 mEq/L (ref 137–147)
TOTAL PROTEIN: 6.9 g/dL (ref 6.0–8.3)
Total Bilirubin: 0.2 mg/dL — ABNORMAL LOW (ref 0.3–1.2)

## 2014-08-31 LAB — CBC WITH DIFFERENTIAL/PLATELET
BASOS PCT: 0 % (ref 0–1)
Basophils Absolute: 0 10*3/uL (ref 0.0–0.1)
Eosinophils Absolute: 0 10*3/uL (ref 0.0–0.7)
Eosinophils Relative: 0 % (ref 0–5)
HCT: 33.9 % — ABNORMAL LOW (ref 36.0–46.0)
HEMOGLOBIN: 11.7 g/dL — AB (ref 12.0–15.0)
Lymphocytes Relative: 12 % (ref 12–46)
Lymphs Abs: 1.2 10*3/uL (ref 0.7–4.0)
MCH: 33 pg (ref 26.0–34.0)
MCHC: 34.5 g/dL (ref 30.0–36.0)
MCV: 95.5 fL (ref 78.0–100.0)
Monocytes Absolute: 0.5 10*3/uL (ref 0.1–1.0)
Monocytes Relative: 5 % (ref 3–12)
NEUTROS ABS: 8.6 10*3/uL — AB (ref 1.7–7.7)
NEUTROS PCT: 83 % — AB (ref 43–77)
PLATELETS: 140 10*3/uL — AB (ref 150–400)
RBC: 3.55 MIL/uL — AB (ref 3.87–5.11)
RDW: 13.7 % (ref 11.5–15.5)
WBC: 10.4 10*3/uL (ref 4.0–10.5)

## 2014-08-31 LAB — URINALYSIS, ROUTINE W REFLEX MICROSCOPIC
Bilirubin Urine: NEGATIVE
Glucose, UA: 500 mg/dL — AB
HGB URINE DIPSTICK: NEGATIVE
KETONES UR: NEGATIVE mg/dL
Leukocytes, UA: NEGATIVE
Nitrite: NEGATIVE
PROTEIN: NEGATIVE mg/dL
Specific Gravity, Urine: 1.016 (ref 1.005–1.030)
Urobilinogen, UA: 0.2 mg/dL (ref 0.0–1.0)
pH: 7 (ref 5.0–8.0)

## 2014-08-31 LAB — LIPASE, BLOOD: Lipase: 34 U/L (ref 11–59)

## 2014-08-31 MED ORDER — MECLIZINE HCL 25 MG PO TABS
25.0000 mg | ORAL_TABLET | Freq: Once | ORAL | Status: DC
  Filled 2014-08-31: qty 1

## 2014-08-31 MED ORDER — ONDANSETRON 4 MG PO TBDP: 4.0000 mg | ORAL_TABLET | Freq: Three times a day (TID) | ORAL | Status: DC | PRN

## 2014-08-31 MED ORDER — ONDANSETRON HCL 4 MG/2ML IJ SOLN
4.0000 mg | Freq: Once | INTRAMUSCULAR | Status: AC
  Administered 2014-08-31: 4 mg via INTRAVENOUS
  Filled 2014-08-31: qty 2

## 2014-08-31 MED ORDER — SODIUM CHLORIDE 0.9 % IV BOLUS (SEPSIS)
500.0000 mL | Freq: Once | INTRAVENOUS | Status: AC
  Administered 2014-08-31: 500 mL via INTRAVENOUS

## 2014-08-31 NOTE — ED Provider Notes (Signed)
CSN: [id number]     Arrival date & time 08/31/14  9678 History   First MD Initiated Contact with Patient 08/31/14 [phone]     Chief Complaint  Patient presents with  . Abdominal Pain     (Consider location/radiation/quality/duration/timing/severity/associated sxs/prior Treatment) Patient is a 78 y.o. female presenting with abdominal pain. The history is provided by the patient.  Abdominal Pain Pain location:  Generalized Associated symptoms: chest pain, diarrhea, nausea and vomiting   Associated symptoms: no shortness of breath   patient presents with vertigo. History of same. Has nausea vomiting and diarrhea. Has a history of Mnire's disease and states this feels the same. No fevers. States she feels dehydrated. The symptoms began this morning. No headache. She has had some left-sided chest/flank pain also. She states it feels like her bone cancer. No trauma.  Past Medical History  Diagnosis Date  . Anxiety   . Asthma   . Breast cancer     metastasized  . Depression   . Diabetes mellitus   . GERD (gastroesophageal reflux disease)   . Hyperlipidemia   . Internal hemorrhoids   . IBS (irritable bowel syndrome)   . Mitral valve prolapse   . Ischemic colitis   . Fibromyalgia   . Allergy   . Cataract   . S/P radiation therapy within four to twelve weeks 04/26/13-05/11/13    L-spine/sacrum 30Gy/8fx  . Colon polyp 2013    TUBULAR ADENOMA   Past Surgical History  Procedure Laterality Date  . Mastectomy Left     then treated with chemo and radiation  . Total abdominal hysterectomy    . Back surgery      synovial cyst removed from spine  . Carpal tunnel release Bilateral   . Dilation and curettage of uterus    . Appendectomy    . Cataract extraction Bilateral   . Colonoscopy w/ biopsies  2013  . Esophagogastroduodenoscopy  2008    normal   Family History  Problem Relation Age of Onset  . Prostate cancer Brother     x2  . Irritable bowel syndrome Mother   . Colon cancer  Neg Hx   . Stroke Father    History  Substance Use Topics  . Smoking status: Never Smoker   . Smokeless tobacco: Never Used  . Alcohol Use: No   OB History    No data available     Review of Systems  Constitutional: Negative for activity change and appetite change.  Eyes: Negative for pain and visual disturbance.  Respiratory: Negative for chest tightness and shortness of breath.   Cardiovascular: Positive for chest pain. Negative for leg swelling.  Gastrointestinal: Positive for nausea, vomiting, abdominal pain and diarrhea.  Genitourinary: Positive for flank pain.  Musculoskeletal: Negative for back pain and neck stiffness.  Skin: Negative for rash.  Neurological: Positive for dizziness. Negative for seizures, speech difficulty, weakness, numbness and headaches.  Psychiatric/Behavioral: Negative for behavioral problems.      Allergies  Hydrocodone; Oxycodone; Metformin and related; Morphine and related; Niacin; and Promethazine hcl  Home Medications   Prior to Admission medications   Medication Sig Start Date End Date Taking? Authorizing Provider  acetaminophen (TYLENOL) 500 MG tablet Take 500-1,000 mg by mouth every 6 (six) hours as needed for mild pain or moderate pain.    Yes Historical Provider, MD  ALREX 0.2 % SUSP Place 1 drop into both eyes daily.  08/20/14  Yes Historical Provider, MD  beclomethasone (QVAR) 40 MCG/ACT inhaler Inhale  2 puffs into the lungs 2 (two) times daily.    Yes Historical Provider, MD  BYSTOLIC 5 MG tablet Take 1 tablet by mouth Daily. 02/16/11  Yes Historical Provider, MD  Calcium Carbonate (CALCIUM 600 PO) Take 1 tablet by mouth daily.   Yes Historical Provider, MD  Cholecalciferol (VITAMIN D) 1000 UNITS capsule Take 1,000 Units by mouth 2 (two) times daily.    Yes Historical Provider, MD  clonazePAM (KLONOPIN) 0.5 MG tablet Take 0.5 mg by mouth 2 (two) times daily as needed for anxiety.    Yes Historical Provider, MD  CRESTOR 5 MG tablet Take  1 tablet by mouth Daily. 02/05/11  Yes Historical Provider, MD  glimepiride (AMARYL) 1 MG tablet Take 1 mg by mouth daily.    Yes Historical Provider, MD  GLYCERIN ADULT 2 G suppository Place 1 suppository rectally daily as needed for mild constipation.  01/21/14  Yes Historical Provider, MD  hydrochlorothiazide (HYDRODIURIL) 25 MG tablet Take 12.5 mg by mouth every Monday, Wednesday, and Friday.    Yes Historical Provider, MD  hydrocortisone (ANUSOL-HC) 2.5 % rectal cream Place 1 application rectally daily as needed for hemorrhoids or itching.  05/18/13  Yes [name], MD  letrozole [facility]) 2.5 MG tablet Take 1 tablet (2.5 mg total) by mouth daily. 07/17/13  Yes Chauncey Cruel, MD  lidocaine-hydrocortisone [facility]) 3-0.5 % CREA Place 1 Applicatorful rectally 2 (two) times daily as needed for hemorrhoids and bleeding. 05/17/13  Yes [name], MD  meclizine (ANTIVERT) 25 MG tablet Take 25 mg by mouth as needed for dizziness.  12/13/10  Yes Historical Provider, MD  metoCLOPramide (REGLAN) 5 MG tablet Take 1 tablet (5 mg total) by mouth every 8 (eight) hours as needed for nausea. 01/31/14  Yes Jessica D. Zehr, PA-C  NEXIUM 40 MG capsule Take 1 tablet by mouth Daily. 03/03/11  Yes Historical Provider, MD  ONGLYZA 5 MG TABS tablet Take 5 mg by mouth daily.  02/24/12  Yes Historical Provider, MD  Polyethyl Glycol-Propyl Glycol (SYSTANE OP) Apply 1-2 drops to eye daily as needed (dry eyes).    Yes Historical Provider, MD  PROAIR HFA 108 (90 BASE) MCG/ACT inhaler Inhale 1-2 puffs into the lungs every 6 (six) hours as needed for wheezing or shortness of breath.  05/30/12  Yes Historical Provider, MD  sucralfate (CARAFATE) 1 G tablet Take 1 g by mouth 4 (four) times daily.   Yes Historical Provider, MD  traMADol (ULTRAM) 50 MG tablet Take 1 tablet (50 mg total) by mouth every 6 (six) hours as needed. Patient taking differently: Take 50 mg by mouth every 6 (six) hours as needed for moderate pain.  04/24/14  Yes  Chauncey Cruel, MD  ondansetron (ZOFRAN-ODT) 4 MG disintegrating tablet Take 1 tablet (4 mg total) by mouth every 8 (eight) hours as needed for nausea or vomiting. 08/31/14   Jasper Riling. Tra Wilemon, MD   BP 122/40 mmHg  Pulse 80  Temp(Src) 97.6 F (36.4 C) (Oral)  Resp 16  SpO2 96% Physical Exam  Constitutional: She appears well-developed and well-nourished.  HENT:  Head: Normocephalic.  Eyes: Pupils are equal, round, and reactive to light.  Neck: Neck supple.  Cardiovascular: Normal rate and regular rhythm.   Pulmonary/Chest: She exhibits tenderness.  Mild tenderness to left lower lateral chest wall. No crepitance or deformity.  Abdominal: Soft. There is no tenderness.  Musculoskeletal: Normal range of motion.  Neurological: She is alert.  Some nystagmus with view to  right.   Skin: Skin is warm.    ED Course  Procedures (including critical care time) Labs Review Labs Reviewed  CBC WITH DIFFERENTIAL - Abnormal; Notable for the following:    RBC 3.55 (*)    Hemoglobin 11.7 (*)    HCT 33.9 (*)    Platelets 140 (*)    Neutrophils Relative % 83 (*)    Neutro Abs 8.6 (*)    All other components within normal limits  COMPREHENSIVE METABOLIC PANEL - Abnormal; Notable for the following:    Potassium 3.6 (*)    Glucose, Bld 194 (*)    AST 40 (*)    Total Bilirubin 0.2 (*)    GFR calc non Af Amer 86 (*)    All other components within normal limits  URINALYSIS, ROUTINE W REFLEX MICROSCOPIC - Abnormal; Notable for the following:    Glucose, UA 500 (*)    All other components within normal limits  LIPASE, BLOOD    Imaging Review Dg Ribs Unilateral W/chest Left  08/31/2014   CLINICAL DATA:  Anterior pain extending to the posterior inferior aspect of the left ribs. Pain for 1 week. History of breast cancer with known metastatic disease.  EXAM: LEFT RIBS AND CHEST - 3+ VIEW  COMPARISON:  Whole body bone scan 07/18/2014 and CT of the chest 02/25/2014.  FINDINGS: Heart size is  normal. Lungs are clear. The patient is status post left mastectomy.  Dedicated imaging of the ribs demonstrates no acute or healing fracture. No focal lytic or blastic lesions are evident. Mild degenerative changes are noted within the thoracic spine. There are sclerotic lesions throughout the thoracolumbar spine compatible with known bone metastases.  IMPRESSION: 1. Scattered sclerotic lesions throughout the thoracic spine compatible with known metastases. 2. No acute or focal lesion within the left ribs. 3. No acute cardiopulmonary disease.   Electronically Signed   By: Lawrence Santiago [name].   On: 08/31/2014 10:12     EKG Interpretation None      MDM   Final diagnoses:  Rib pain on left side  Vertigo    Patient with nausea and vomiting with some diarrhea. Also vertigo. His history of Mnire's disease. Has had nausea vomiting diarrhea with these episodes in the past. No relief with her Antivert he cut she vomited up. Patient feels better here. Vertigo is improved. She is now ambulatory. Will discharge home. Also had some sharp left-sided chest pain. Negative x-ray for fracture or metastatic disease, for what is worse. Will discharge home.    Jasper Riling. Alvino Chapel, MD 08/31/14 [phone]

## 2014-08-31 NOTE — ED Notes (Signed)
Pt in DG at present time. Will draw labs and administer medications as ordered with pt return.

## 2014-08-31 NOTE — ED Notes (Addendum)
Per EMS pt onset of left side abdominal pain with n/v/d and dizziness. Pt reports hx of meniere's disease. Pt reports took meclizine prior to arrival.

## 2014-08-31 NOTE — Discharge Instructions (Signed)

## 2014-08-31 NOTE — ED Notes (Signed)
Bed: QP84 Expected date: 08/31/14 Expected time: 8:24 AM Means of arrival: Ambulance Comments: abd pain N/V/D

## 2014-09-06 ENCOUNTER — Other Ambulatory Visit: Payer: Self-pay | Admitting: Oncology

## 2014-09-25 ENCOUNTER — Encounter: Payer: Self-pay | Admitting: Gastroenterology

## 2014-09-25 ENCOUNTER — Ambulatory Visit (INDEPENDENT_AMBULATORY_CARE_PROVIDER_SITE_OTHER): Payer: Commercial Managed Care - HMO | Admitting: Gastroenterology

## 2014-09-25 VITALS — BP 130/60 | HR 72 | Ht 63.0 in | Wt 143.2 lb

## 2014-09-25 DIAGNOSIS — K219 Gastro-esophageal reflux disease without esophagitis: Secondary | ICD-10-CM

## 2014-09-25 DIAGNOSIS — K589 Irritable bowel syndrome without diarrhea: Secondary | ICD-10-CM

## 2014-09-25 MED ORDER — DICYCLOMINE HCL 10 MG PO CAPS: 10.0000 mg | ORAL_CAPSULE | Freq: Three times a day (TID) | ORAL | Status: DC

## 2014-09-25 MED ORDER — ESOMEPRAZOLE MAGNESIUM 40 MG PO CPDR: 40.0000 mg | DELAYED_RELEASE_CAPSULE | Freq: Every day | ORAL | Status: AC

## 2014-09-25 NOTE — Patient Instructions (Addendum)
We have sent the following medications to your pharmacy for you to pick up at your convenience: Nexium 40 mg Dicyclomine

## 2014-09-28 NOTE — Telephone Encounter (Signed)
No entry 

## 2014-10-01 ENCOUNTER — Telehealth: Payer: Self-pay | Admitting: Oncology

## 2014-10-01 NOTE — Telephone Encounter (Signed)
S/w pt advised appt chg from 1/21 (md pal) to 2/4 @ 12.45pm (with HF when GM is back per GM). Also mailed appt calendar.

## 2014-10-03 ENCOUNTER — Encounter: Payer: Self-pay | Admitting: Gastroenterology

## 2014-10-03 NOTE — Progress Notes (Signed)
Agree. Refill Zofran, Bentyl,prn.
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
## 2014-10-08 ENCOUNTER — Other Ambulatory Visit: Payer: Self-pay | Admitting: Emergency Medicine

## 2014-10-19 ENCOUNTER — Emergency Department (HOSPITAL_COMMUNITY)
Admission: EM | Admit: 2014-10-19 | Discharge: 2014-10-19 | Disposition: A | Discharge status: Home / Self Care | Payer: Commercial Managed Care - HMO | Source: Home / Self Care | Attending: Emergency Medicine | Admitting: Emergency Medicine

## 2014-10-19 ENCOUNTER — Encounter (HOSPITAL_COMMUNITY): Payer: Self-pay

## 2014-10-19 DIAGNOSIS — H6092 Unspecified otitis externa, left ear: Secondary | ICD-10-CM | POA: Diagnosis not present

## 2014-10-19 MED ORDER — CIPROFLOXACIN-DEXAMETHASONE 0.3-0.1 % OT SUSP: 4.0000 [drp] | Freq: Two times a day (BID) | OTIC | Status: DC

## 2014-10-19 NOTE — ED Notes (Signed)
History of inner ear disturbance and bone CA. Pain in ear since last nighrt

## 2014-10-19 NOTE — Discharge Instructions (Signed)
It looks like the ear canal has an infection. Use the ear drops twice a day for 1 week. Follow up with your regular doctor in 1-2 weeks for recheck. If you develop fevers or a rash, please come back sooner.

## 2014-10-19 NOTE — ED Provider Notes (Signed)
CSN: [id number]     Arrival date & time 10/19/14  1417 History   First MD Initiated Contact with Patient 10/19/14 1427     Chief Complaint  Patient presents with  . Otalgia   (Consider location/radiation/quality/duration/timing/severity/associated sxs/prior Treatment) HPI  She is a 79 year old woman here for evaluation of left ear pain. She states this is been going on for 2-3 weeks, but worse in the last day or so. When she presses on her left ear the pain radiates through the left side of her head. She has a history of Mnire's and this has been worse over the last several weeks. No fevers or chills. No temporal headache. No change in hearing. No ear drainage.  Past Medical History  Diagnosis Date  . Anxiety   . Asthma   . Breast cancer     metastasized  . Depression   . Diabetes mellitus   . GERD (gastroesophageal reflux disease)   . Hyperlipidemia   . Internal hemorrhoids   . IBS (irritable bowel syndrome)   . Mitral valve prolapse   . Ischemic colitis   . Fibromyalgia   . Allergy   . Cataract   . S/P radiation therapy within four to twelve weeks 04/26/13-05/11/13    L-spine/sacrum 30Gy/95fx  . Colon polyp 2013    TUBULAR ADENOMA   Past Surgical History  Procedure Laterality Date  . Mastectomy Left     then treated with chemo and radiation  . Total abdominal hysterectomy    . Back surgery      synovial cyst removed from spine  . Carpal tunnel release Bilateral   . Dilation and curettage of uterus    . Appendectomy    . Cataract extraction Bilateral   . Colonoscopy w/ biopsies  2013  . Esophagogastroduodenoscopy  2008    normal   Family History  Problem Relation Age of Onset  . Prostate cancer Brother     x2  . Irritable bowel syndrome Mother   . Colon cancer Neg Hx   . Stroke Father    History  Substance Use Topics  . Smoking status: Never Smoker   . Smokeless tobacco: Never Used  . Alcohol Use: No   OB History    No data available     Review of  Systems  Constitutional: Negative for fever.  HENT: Positive for ear pain.   Neurological: Positive for dizziness.    Allergies  Hydrocodone; Oxycodone; Metformin and related; Morphine and related; Niacin; and Promethazine hcl  Home Medications   Prior to Admission medications   Medication Sig Start Date End Date Taking? Authorizing Provider  ACCU-CHEK SMARTVIEW test strip  08/25/14   Historical Provider, MD  acetaminophen (TYLENOL) 500 MG tablet Take 500-1,000 mg by mouth every 6 (six) hours as needed for mild pain or moderate pain.     Historical Provider, MD  ALREX 0.2 % SUSP Place 1 drop into both eyes daily.  08/20/14   Historical Provider, MD  BYSTOLIC 5 MG tablet Take 1 tablet by mouth Daily. 02/16/11   Historical Provider, MD  Calcium Carbonate (CALCIUM 600 PO) Take 1 tablet by mouth daily.    Historical Provider, MD  Cholecalciferol (VITAMIN D) 1000 UNITS capsule Take 1,000 Units by mouth 2 (two) times daily.     Historical Provider, MD  ciprofloxacin-dexamethasone (CIPRODEX) otic suspension Place 4 drops into the left ear 2 (two) times daily. For 1 week. 10/19/14   Melony Overly, MD  clonazePAM (KLONOPIN) 0.5 MG  tablet Take 0.5 mg by mouth 2 (two) times daily as needed for anxiety.     Historical Provider, MD  CRESTOR 5 MG tablet Take 1 tablet by mouth Daily. 02/05/11   Historical Provider, MD  dicyclomine (BENTYL) 10 MG capsule Take 1 capsule (10 mg total) by mouth 4 (four) times daily -  before meals and at bedtime. 09/25/14   Laban Emperor. Zehr, PA-C  esomeprazole (NEXIUM) 40 MG capsule Take 1 capsule (40 mg total) by mouth daily. 09/25/14   Laban Emperor. Zehr, PA-C  famotidine (PEPCID) 20-0.9 MG/50ML-%  09/19/14   Historical Provider, MD  glimepiride (AMARYL) 1 MG tablet Take 1 mg by mouth daily.     Historical Provider, MD  GLYCERIN ADULT 2 G suppository Place 1 suppository rectally daily as needed for mild constipation.  01/21/14   Historical Provider, MD  hydrochlorothiazide (HYDRODIURIL) 25  MG tablet Take 12.5 mg by mouth every Monday, Wednesday, and Friday.     Historical Provider, MD  hydrocortisone (ANUSOL-HC) 2.5 % rectal cream Place 1 application rectally daily as needed for hemorrhoids or itching.  05/18/13   [name], MD  letrozole (FEMARA) 2.5 MG tablet TAKE 1 TABLET (2.5 MG TOTAL) BY MOUTH DAILY. 09/06/14   Chauncey Cruel, MD  lidocaine-hydrocortisone [facility]) 3-0.5 % CREA Place 1 Applicatorful rectally 2 (two) times daily as needed for hemorrhoids and bleeding. 05/17/13   [name], MD  meclizine (ANTIVERT) 25 MG tablet Take 25 mg by mouth as needed for dizziness.  12/13/10   Historical Provider, MD  metoCLOPramide (REGLAN) 5 MG tablet Take 1 tablet (5 mg total) by mouth every 8 (eight) hours as needed for nausea. 01/31/14   Janett Billow D. Zehr, PA-C  ondansetron (ZOFRAN-ODT) 4 MG disintegrating tablet Take 1 tablet (4 mg total) by mouth every 8 (eight) hours as needed for nausea or vomiting. 08/31/14   Jasper Riling. Pickering, MD  ONGLYZA 5 MG TABS tablet Take 5 mg by mouth daily.  02/24/12   Historical Provider, MD  Polyethyl Glycol-Propyl Glycol (SYSTANE OP) Apply 1-2 drops to eye daily as needed (dry eyes).     Historical Provider, MD  PROAIR HFA 108 (90 BASE) MCG/ACT inhaler Inhale 1-2 puffs into the lungs every 6 (six) hours as needed for wheezing or shortness of breath.  05/30/12   Historical Provider, MD  QVAR 80 MCG/ACT inhaler  09/22/14   Historical Provider, MD  sucralfate (CARAFATE) 1 G tablet Take 1 g by mouth 4 (four) times daily.    Historical Provider, MD  traMADol (ULTRAM) 50 MG tablet Take 1 tablet (50 mg total) by mouth every 6 (six) hours as needed. Patient taking differently: Take 50 mg by mouth every 6 (six) hours as needed for moderate pain.  04/24/14   Chauncey Cruel, MD  triamterene-hydrochlorothiazide (MAXZIDE) 75-50 MG per tablet  09/02/14   Historical Provider, MD   BP 143/82 mmHg  Pulse 85  Temp(Src) 98.4 F (36.9 C) (Oral)  Resp 16  SpO2  95% Physical Exam  Constitutional: She appears well-developed and well-nourished. No distress.  HENT:  Head: Normocephalic and atraumatic.  Right Ear: Tympanic membrane, external ear and ear canal normal.  Left Ear: Tympanic membrane normal. There is swelling (ear canal).  No temporal tenderness.  Neck: Neck supple.  Cardiovascular: Normal rate.   Pulmonary/Chest: Effort normal.  Skin:  No rashes.    ED Course  Procedures (including critical care time) Labs Review Labs Reviewed - No data to display  Imaging Review No results found.   MDM   1. Left otitis externa    Ear canal is swollen. No rash to indicate shingles. We'll treat with Ciprodex for 1 week. Follow-up with primary care provider in 1-2 weeks for recheck. If she develops fevers or a rash she will return sooner.    Melony Overly, MD 10/19/14 [phone]

## 2014-10-23 ENCOUNTER — Other Ambulatory Visit: Payer: Self-pay | Admitting: *Deleted

## 2014-10-23 MED ORDER — OXYCODONE-ACETAMINOPHEN 5-325 MG PO TABS: 1.0000 | ORAL_TABLET | Freq: Four times a day (QID) | ORAL | Status: DC | PRN

## 2014-11-06 ENCOUNTER — Ambulatory Visit: Payer: Commercial Managed Care - HMO | Admitting: Oncology

## 2014-11-07 ENCOUNTER — Ambulatory Visit: Payer: Commercial Managed Care - HMO | Admitting: Oncology

## 2014-11-07 ENCOUNTER — Ambulatory Visit: Payer: Commercial Managed Care - HMO

## 2014-11-07 ENCOUNTER — Other Ambulatory Visit: Payer: Commercial Managed Care - HMO

## 2014-11-15 ENCOUNTER — Emergency Department (HOSPITAL_COMMUNITY): Payer: Commercial Managed Care - HMO

## 2014-11-15 ENCOUNTER — Encounter (HOSPITAL_COMMUNITY): Payer: Self-pay | Admitting: Emergency Medicine

## 2014-11-15 ENCOUNTER — Emergency Department (HOSPITAL_COMMUNITY)
Admission: EM | Admit: 2014-11-15 | Discharge: 2014-11-16 | Disposition: A | Discharge status: Home / Self Care | Payer: Commercial Managed Care - HMO | Attending: Emergency Medicine | Admitting: Emergency Medicine

## 2014-11-15 DIAGNOSIS — R197 Diarrhea, unspecified: Secondary | ICD-10-CM | POA: Diagnosis present

## 2014-11-15 DIAGNOSIS — R630 Anorexia: Secondary | ICD-10-CM | POA: Diagnosis not present

## 2014-11-15 DIAGNOSIS — J45909 Unspecified asthma, uncomplicated: Secondary | ICD-10-CM | POA: Insufficient documentation

## 2014-11-15 DIAGNOSIS — F419 Anxiety disorder, unspecified: Secondary | ICD-10-CM | POA: Insufficient documentation

## 2014-11-15 DIAGNOSIS — Z8679 Personal history of other diseases of the circulatory system: Secondary | ICD-10-CM | POA: Insufficient documentation

## 2014-11-15 DIAGNOSIS — Z79899 Other long term (current) drug therapy: Secondary | ICD-10-CM | POA: Diagnosis not present

## 2014-11-15 DIAGNOSIS — Z90710 Acquired absence of both cervix and uterus: Secondary | ICD-10-CM | POA: Diagnosis not present

## 2014-11-15 DIAGNOSIS — K219 Gastro-esophageal reflux disease without esophagitis: Secondary | ICD-10-CM | POA: Diagnosis not present

## 2014-11-15 DIAGNOSIS — Z923 Personal history of irradiation: Secondary | ICD-10-CM | POA: Insufficient documentation

## 2014-11-15 DIAGNOSIS — R945 Abnormal results of liver function studies: Secondary | ICD-10-CM

## 2014-11-15 DIAGNOSIS — Z86010 Personal history of colonic polyps: Secondary | ICD-10-CM | POA: Diagnosis not present

## 2014-11-15 DIAGNOSIS — R110 Nausea: Secondary | ICD-10-CM | POA: Diagnosis not present

## 2014-11-15 DIAGNOSIS — R531 Weakness: Secondary | ICD-10-CM | POA: Insufficient documentation

## 2014-11-15 DIAGNOSIS — F329 Major depressive disorder, single episode, unspecified: Secondary | ICD-10-CM | POA: Insufficient documentation

## 2014-11-15 DIAGNOSIS — Z9889 Other specified postprocedural states: Secondary | ICD-10-CM | POA: Insufficient documentation

## 2014-11-15 DIAGNOSIS — R7989 Other specified abnormal findings of blood chemistry: Secondary | ICD-10-CM | POA: Diagnosis not present

## 2014-11-15 DIAGNOSIS — M797 Fibromyalgia: Secondary | ICD-10-CM | POA: Diagnosis not present

## 2014-11-15 DIAGNOSIS — Z853 Personal history of malignant neoplasm of breast: Secondary | ICD-10-CM | POA: Insufficient documentation

## 2014-11-15 DIAGNOSIS — Z8669 Personal history of other diseases of the nervous system and sense organs: Secondary | ICD-10-CM | POA: Insufficient documentation

## 2014-11-15 DIAGNOSIS — E119 Type 2 diabetes mellitus without complications: Secondary | ICD-10-CM | POA: Insufficient documentation

## 2014-11-15 DIAGNOSIS — Z7952 Long term (current) use of systemic steroids: Secondary | ICD-10-CM | POA: Insufficient documentation

## 2014-11-15 DIAGNOSIS — R1032 Left lower quadrant pain: Secondary | ICD-10-CM | POA: Insufficient documentation

## 2014-11-15 LAB — CBC WITH DIFFERENTIAL/PLATELET
BASOS ABS: 0 10*3/uL (ref 0.0–0.1)
Basophils Relative: 0 % (ref 0–1)
Eosinophils Absolute: 0.1 10*3/uL (ref 0.0–0.7)
Eosinophils Relative: 1 % (ref 0–5)
HEMATOCRIT: 37.1 % (ref 36.0–46.0)
Hemoglobin: 12.7 g/dL (ref 12.0–15.0)
LYMPHS ABS: 3.6 10*3/uL (ref 0.7–4.0)
Lymphocytes Relative: 32 % (ref 12–46)
MCH: 34.2 pg — AB (ref 26.0–34.0)
MCHC: 34.2 g/dL (ref 30.0–36.0)
MCV: 100 fL (ref 78.0–100.0)
Monocytes Absolute: 0.8 10*3/uL (ref 0.1–1.0)
Monocytes Relative: 7 % (ref 3–12)
NEUTROS ABS: 6.6 10*3/uL (ref 1.7–7.7)
NEUTROS PCT: 60 % (ref 43–77)
PLATELETS: 154 10*3/uL (ref 150–400)
RBC: 3.71 MIL/uL — AB (ref 3.87–5.11)
RDW: 15.5 % (ref 11.5–15.5)
WBC: 11.1 10*3/uL — ABNORMAL HIGH (ref 4.0–10.5)

## 2014-11-15 LAB — BASIC METABOLIC PANEL
Anion gap: 10 (ref 5–15)
BUN: 13 mg/dL (ref 6–23)
CALCIUM: 9.6 mg/dL (ref 8.4–10.5)
CO2: 26 mmol/L (ref 19–32)
CREATININE: 0.65 mg/dL (ref 0.50–1.10)
Chloride: 104 mmol/L (ref 96–112)
GFR calc Af Amer: >90 mL/min (ref >90)
GFR, EST NON AFRICAN AMERICAN: 84 mL/min — AB (ref >90)
Glucose, Bld: 152 mg/dL — ABNORMAL HIGH (ref 70–99)
POTASSIUM: 3.5 mmol/L (ref 3.5–5.1)
Sodium: 140 mmol/L (ref 135–145)

## 2014-11-15 LAB — I-STAT CG4 LACTIC ACID, ED: Lactic Acid, Venous: 1.72 mmol/L (ref 0.5–2.0)

## 2014-11-15 LAB — CBG MONITORING, ED: Glucose-Capillary: 149 mg/dL — ABNORMAL HIGH (ref 70–99)

## 2014-11-15 MED ORDER — ONDANSETRON HCL 4 MG/2ML IJ SOLN
4.0000 mg | Freq: Once | INTRAMUSCULAR | Status: DC
  Filled 2014-11-15: qty 2

## 2014-11-15 MED ORDER — SODIUM CHLORIDE 0.9 % IV BOLUS (SEPSIS)
1000.0000 mL | Freq: Once | INTRAVENOUS | Status: AC
Start: 2014-11-15 — End: 2014-11-16
  Administered 2014-11-16: 1000 mL via INTRAVENOUS

## 2014-11-15 MED ORDER — LORAZEPAM 2 MG/ML IJ SOLN
0.5000 mg | Freq: Once | INTRAMUSCULAR | Status: DC
  Filled 2014-11-15: qty 1

## 2014-11-15 NOTE — ED Notes (Signed)
Pt used the bathroom prior to coming back, will let staff know when she can provide a sample

## 2014-11-15 NOTE — ED Provider Notes (Signed)
CSN: [id number]     Arrival date & time 11/15/14  2019 History   First MD Initiated Contact with Patient 11/15/14 2247     Chief Complaint  Patient presents with  . Diarrhea  . Abdominal Pain     (Consider location/radiation/quality/duration/timing/severity/associated sxs/prior Treatment) HPI Jamie [name] is a 79 y.o. female with history of breast ca, diabetes, GERD, anxiety, ischemic colitis, who presents to ED with complaint of abdominal pain, belching,  nausea, diarrhea. Pt states she woke up at 4am this morning with severe cramping in the abdomen, nausea, diarrhea. States abdomen feels distended. Denies vomiting. Pain mainly in left lower quadrant. Denies fever, chills. States no appetite. Able to keep solids and liquids down. Denies blood in stool. Took malanta with no relief.   Past Medical History  Diagnosis Date  . Anxiety   . Asthma   . Breast cancer     metastasized  . Depression   . Diabetes mellitus   . GERD (gastroesophageal reflux disease)   . Hyperlipidemia   . Internal hemorrhoids   . IBS (irritable bowel syndrome)   . Mitral valve prolapse   . Ischemic colitis   . Fibromyalgia   . Allergy   . Cataract   . S/P radiation therapy within four to twelve weeks 04/26/13-05/11/13    L-spine/sacrum 30Gy/15fx  . Colon polyp 2013    TUBULAR ADENOMA   Past Surgical History  Procedure Laterality Date  . Mastectomy Left     then treated with chemo and radiation  . Total abdominal hysterectomy    . Back surgery      synovial cyst removed from spine  . Carpal tunnel release Bilateral   . Dilation and curettage of uterus    . Appendectomy    . Cataract extraction Bilateral   . Colonoscopy w/ biopsies  2013  . Esophagogastroduodenoscopy  2008    normal   Family History  Problem Relation Age of Onset  . Prostate cancer Brother     x2  . Irritable bowel syndrome Mother   . Colon cancer Neg Hx   . Stroke Father    History  Substance Use Topics  . Smoking  status: Never Smoker   . Smokeless tobacco: Never Used  . Alcohol Use: No   OB History    No data available     Review of Systems  Constitutional: Positive for fatigue. Negative for fever and chills.  Respiratory: Negative for cough, chest tightness and shortness of breath.   Cardiovascular: Negative for chest pain, palpitations and leg swelling.  Gastrointestinal: Positive for nausea, abdominal pain, diarrhea and abdominal distention. Negative for vomiting and blood in stool.  Genitourinary: Negative for dysuria, flank pain and pelvic pain.  Musculoskeletal: Negative for myalgias, arthralgias, neck pain and neck stiffness.  Skin: Negative for rash.  Neurological: Positive for weakness. Negative for dizziness and headaches.  All other systems reviewed and are negative.     Allergies  Hydrocodone; Metformin and related; Morphine and related; Niacin; and Promethazine hcl  Home Medications   Prior to Admission medications   Medication Sig Start Date End Date Taking? Authorizing Provider  ACCU-CHEK SMARTVIEW test strip  08/25/14  Yes Historical Provider, MD  acetaminophen (TYLENOL) 500 MG tablet Take 500-1,000 mg by mouth every 6 (six) hours as needed for mild pain or moderate pain.    Yes Historical Provider, MD  ALREX 0.2 % SUSP Place 1 drop into both eyes daily.  08/20/14  Yes Historical Provider, MD  BYSTOLIC 5 MG tablet Take 1 tablet by mouth Daily. 02/16/11  Yes Historical Provider, MD  Calcium Carbonate (CALCIUM 600 PO) Take 1 tablet by mouth daily.   Yes Historical Provider, MD  Cholecalciferol (VITAMIN D) 1000 UNITS capsule Take 1,000 Units by mouth 2 (two) times daily.    Yes Historical Provider, MD  ciprofloxacin-dexamethasone (CIPRODEX) otic suspension Place 4 drops into the left ear 2 (two) times daily. For 1 week. 10/19/14  Yes Melony Overly, MD  clonazePAM (KLONOPIN) 0.5 MG tablet Take 0.5 mg by mouth 2 (two) times daily as needed for anxiety.    Yes Historical Provider, MD   CRESTOR 5 MG tablet Take 1 tablet by mouth Daily. 02/05/11  Yes Historical Provider, MD  dicyclomine (BENTYL) 10 MG capsule Take 1 capsule (10 mg total) by mouth 4 (four) times daily -  before meals and at bedtime. Patient taking differently: Take 10 mg by mouth 3 (three) times daily between meals as needed (stomach pain).  09/25/14  Yes Jessica D. Zehr, PA-C  glimepiride (AMARYL) 1 MG tablet Take 1 mg by mouth daily.    Yes Historical Provider, MD  GLYCERIN ADULT 2 G suppository Place 1 suppository rectally daily as needed for mild constipation.  01/21/14  Yes Historical Provider, MD  hydrochlorothiazide (HYDRODIURIL) 25 MG tablet Take 12.5 mg by mouth every Monday, Wednesday, and Friday.    Yes Historical Provider, MD  hydrocortisone (ANUSOL-HC) 2.5 % rectal cream Place 1 application rectally daily as needed for hemorrhoids or itching.  05/18/13  Yes [name], MD  letrozole [facility]) 2.5 MG tablet TAKE 1 TABLET (2.5 MG TOTAL) BY MOUTH DAILY. 09/06/14  Yes Chauncey Cruel, MD  lidocaine-hydrocortisone [facility]) 3-0.5 % CREA Place 1 Applicatorful rectally 2 (two) times daily as needed for hemorrhoids and bleeding. 05/17/13  Yes [name], MD  meclizine (ANTIVERT) 25 MG tablet Take 25 mg by mouth as needed for dizziness.  12/13/10  Yes Historical Provider, MD  metoCLOPramide (REGLAN) 5 MG tablet Take 1 tablet (5 mg total) by mouth every 8 (eight) hours as needed for nausea. 01/31/14  Yes Jessica D. Zehr, PA-C  omeprazole (PRILOSEC) 40 MG capsule Take 40 mg by mouth daily.   Yes Historical Provider, MD  ONGLYZA 5 MG TABS tablet Take 5 mg by mouth daily.  02/24/12  Yes Historical Provider, MD  oxyCODONE-acetaminophen (PERCOCET/ROXICET) 5-325 MG per tablet Take 1 tablet by mouth every 6 (six) hours as needed for severe pain. 10/23/14  Yes Chauncey Cruel, MD  Polyethyl Glycol-Propyl Glycol (SYSTANE OP) Apply 1-2 drops to eye daily as needed (dry eyes).    Yes Historical Provider, MD  PROAIR HFA 108  (90 BASE) MCG/ACT inhaler Inhale 1-2 puffs into the lungs every 6 (six) hours as needed for wheezing or shortness of breath.  05/30/12  Yes Historical Provider, MD  QVAR 80 MCG/ACT inhaler Inhale 2 puffs into the lungs 2 (two) times daily.  09/22/14  Yes Historical Provider, MD  sucralfate (CARAFATE) 1 G tablet Take 1 g by mouth 4 (four) times daily.   Yes Historical Provider, MD  traMADol (ULTRAM) 50 MG tablet Take 1 tablet (50 mg total) by mouth every 6 (six) hours as needed. Patient taking differently: Take 50 mg by mouth every 6 (six) hours as needed for moderate pain.  04/24/14  Yes Chauncey Cruel, MD  triamterene-hydrochlorothiazide (MAXZIDE) 75-50 MG per tablet Take 0.5 tablets by mouth 3 (three) times a week.  09/02/14  Yes Historical Provider, MD  esomeprazole (NEXIUM) 40 MG capsule Take 1 capsule (40 mg total) by mouth daily. 09/25/14   Laban Emperor. Zehr, PA-C  ondansetron (ZOFRAN-ODT) 4 MG disintegrating tablet Take 1 tablet (4 mg total) by mouth every 8 (eight) hours as needed for nausea or vomiting. Patient not taking: Reported on 11/15/2014 08/31/14   Jasper Riling. Pickering, MD   BP 149/67 mmHg  Pulse 92  Temp(Src) 98 F (36.7 C) (Oral)  Resp 16  Ht 5\' 4"  (1.626 m)  Wt 140 lb (63.504 kg)  BMI 24.02 kg/m2  SpO2 93% Physical Exam  Constitutional: She is oriented to person, place, and time. She appears well-developed and well-nourished. No distress.  HENT:  Head: Normocephalic.  Eyes: Conjunctivae are normal.  Neck: Neck supple.  Cardiovascular: Normal rate, regular rhythm and normal heart sounds.   Pulmonary/Chest: Effort normal and breath sounds normal. No respiratory distress. She has no wheezes. She has no rales.  Abdominal: Soft. Bowel sounds are normal. She exhibits distension. There is tenderness. There is no rebound.  Left lower quadrant tenderness, no guarding or rebound tenderness  Musculoskeletal: She exhibits no edema.  Neurological: She is alert and oriented to person,  place, and time.  Skin: Skin is warm and dry.  Psychiatric: She has a normal mood and affect. Her behavior is normal.  Nursing note and vitals reviewed.   ED Course  Procedures (including critical care time) Labs Review Labs Reviewed  CBC WITH DIFFERENTIAL/PLATELET - Abnormal; Notable for the following:    WBC 11.1 (*)    RBC 3.71 (*)    MCH 34.2 (*)    All other components within normal limits  BASIC METABOLIC PANEL - Abnormal; Notable for the following:    Glucose, Bld 152 (*)    GFR calc non Af Amer 84 (*)    All other components within normal limits  CBG MONITORING, ED - Abnormal; Notable for the following:    Glucose-Capillary 149 (*)    All other components within normal limits  URINALYSIS, ROUTINE W REFLEX MICROSCOPIC  HEPATIC FUNCTION PANEL  LIPASE, BLOOD    Imaging Review No results found.   EKG Interpretation None      MDM   Final diagnoses:  Left lower quadrant pain  Diarrhea  Elevated LFTs     patient with left lower quadrant pain, diarrhea, nausea, anorexia. History of ischemic colitis. Also history of GERD. Patient states she is very anxious and shaky. She requests something for her nerves, she states she has not taken any medications today. Will order some Ativan, IV fluids, Zofran. Labs pending. Will get CT abdomen.  12:51 AM Pt feeling better. CT negative for acute process. Known osseous mets seen. UA pending. Pt wants to go home.   Pt signed out pending UA. If normal d/c home.   Filed Vitals:   11/15/14 2031 11/16/14 0054 11/16/14 0114 11/16/14 0201  BP: 149/67 151/57 139/84 142/60  Pulse: 92 105 100 86  Temp: 98 F (36.7 C)  98 F (36.7 C)   TempSrc: Oral  Oral Oral  Resp: 16 18 18 18   Height: 5\' 4"  (1.626 m)     Weight: 140 lb (63.504 kg)     SpO2: 93% 93% 96% 96%     Renold Genta, PA-C 11/17/14 0112  Leota Jacobsen, MD 11/17/14 1728

## 2014-11-15 NOTE — ED Notes (Signed)
Pt also was diagnosed with an ear infection approxiamtely two weeks ago and only took 4 days of the prescription (half), then took prescription one day (morning and night dose) and stopped.  She brought all medications except the one she took for ear infection.  She thinks it was Cipro but not sure.

## 2014-11-15 NOTE — ED Notes (Signed)
Pt states she has had about 8 BMs today and now is having diarrhea.

## 2014-11-15 NOTE — ED Notes (Addendum)
Pt c/o upper abdominal pain, belching and bloating since this morning. Pt c/o nausea and diarrhea. No appetite today. Pt alert, no acute distress. Skin warm and dry.

## 2014-11-15 NOTE — ED Notes (Signed)
Unable to obtain IV access on Pt. 2nd RN at bedside to attempt.

## 2014-11-16 LAB — HEPATIC FUNCTION PANEL
ALBUMIN: 4.4 g/dL (ref 3.5–5.2)
ALT: 39 U/L — ABNORMAL HIGH (ref 0–35)
AST: 72 U/L — AB (ref 0–37)
Alkaline Phosphatase: 129 U/L — ABNORMAL HIGH (ref 39–117)
BILIRUBIN INDIRECT: 0.5 mg/dL (ref 0.3–0.9)
Bilirubin, Direct: 0.1 mg/dL (ref 0.0–0.5)
Total Bilirubin: 0.6 mg/dL (ref 0.3–1.2)
Total Protein: 8 g/dL (ref 6.0–8.3)

## 2014-11-16 LAB — URINALYSIS, ROUTINE W REFLEX MICROSCOPIC
BILIRUBIN URINE: NEGATIVE
Glucose, UA: NEGATIVE mg/dL
Hgb urine dipstick: NEGATIVE
KETONES UR: NEGATIVE mg/dL
Leukocytes, UA: NEGATIVE
Nitrite: NEGATIVE
PROTEIN: NEGATIVE mg/dL
Specific Gravity, Urine: 1.027 (ref 1.005–1.030)
UROBILINOGEN UA: 0.2 mg/dL (ref 0.0–1.0)
pH: 7 (ref 5.0–8.0)

## 2014-11-16 LAB — LIPASE, BLOOD: Lipase: 26 U/L (ref 11–59)

## 2014-11-16 MED ORDER — IOHEXOL 300 MG/ML  SOLN
100.0000 mL | Freq: Once | INTRAMUSCULAR | Status: AC | PRN
  Administered 2014-11-16: 100 mL via INTRAVENOUS

## 2014-11-16 MED ORDER — ONDANSETRON HCL 4 MG PO TABS: 4.0000 mg | ORAL_TABLET | Freq: Four times a day (QID) | ORAL | Status: DC

## 2014-11-16 NOTE — Discharge Instructions (Signed)
Take zofran as prescribed as needed for nausea. Make sure to drink plenty of fluids. Your liver tests are slightly elevated. Follow up with your doctor as soon as able. Return if your symptoms are worsening.     Abdominal Pain Many things can cause belly (abdominal) pain. Most times, the belly pain is not dangerous. Many cases of belly pain can be watched and treated at home. HOME CARE   Do not take medicines that help you go poop (laxatives) unless told to by your doctor.  Only take medicine as told by your doctor.  Eat or drink as told by your doctor. Your doctor will tell you if you should be on a special diet. GET HELP IF:  You do not know what is causing your belly pain.  You have belly pain while you are sick to your stomach (nauseous) or have runny poop (diarrhea).  You have pain while you pee or poop.  Your belly pain wakes you up at night.  You have belly pain that gets worse or better when you eat.  You have belly pain that gets worse when you eat fatty foods.  You have a fever. GET HELP RIGHT AWAY IF:   The pain does not go away within 2 hours.  You keep throwing up (vomiting).  The pain changes and is only in the right or left part of the belly.  You have bloody or tarry looking poop. MAKE SURE YOU:   Understand these instructions.  Will watch your condition.  Will get help right away if you are not doing well or get worse. Document Released: 03/22/2008 Document Revised: 10/09/2013 Document Reviewed: 06/13/2013 [facility] Patient Information 2015 [place], [place]. This information is not intended to replace advice given to you by your health care provider. Make sure you discuss any questions you have with your health care provider.

## 2014-11-21 ENCOUNTER — Encounter: Payer: Self-pay | Admitting: Nurse Practitioner

## 2014-11-21 ENCOUNTER — Telehealth: Payer: Self-pay | Admitting: Nurse Practitioner

## 2014-11-21 ENCOUNTER — Ambulatory Visit (HOSPITAL_BASED_OUTPATIENT_CLINIC_OR_DEPARTMENT_OTHER): Payer: Commercial Managed Care - HMO | Admitting: Nurse Practitioner

## 2014-11-21 ENCOUNTER — Other Ambulatory Visit (HOSPITAL_BASED_OUTPATIENT_CLINIC_OR_DEPARTMENT_OTHER): Payer: Commercial Managed Care - HMO

## 2014-11-21 ENCOUNTER — Ambulatory Visit (HOSPITAL_BASED_OUTPATIENT_CLINIC_OR_DEPARTMENT_OTHER): Payer: Commercial Managed Care - HMO

## 2014-11-21 VITALS — BP 138/63 | HR 68 | Temp 97.8°F | Resp 18 | Ht 64.0 in | Wt 143.2 lb

## 2014-11-21 DIAGNOSIS — R0781 Pleurodynia: Secondary | ICD-10-CM

## 2014-11-21 DIAGNOSIS — M545 Low back pain, unspecified: Secondary | ICD-10-CM

## 2014-11-21 DIAGNOSIS — M25551 Pain in right hip: Secondary | ICD-10-CM

## 2014-11-21 DIAGNOSIS — Z170 Estrogen receptor positive status [ER+]: Secondary | ICD-10-CM

## 2014-11-21 DIAGNOSIS — C50212 Malignant neoplasm of upper-inner quadrant of left female breast: Secondary | ICD-10-CM

## 2014-11-21 DIAGNOSIS — C7951 Secondary malignant neoplasm of bone: Secondary | ICD-10-CM

## 2014-11-21 DIAGNOSIS — C50919 Malignant neoplasm of unspecified site of unspecified female breast: Secondary | ICD-10-CM

## 2014-11-21 DIAGNOSIS — R740 Nonspecific elevation of levels of transaminase and lactic acid dehydrogenase [LDH]: Secondary | ICD-10-CM

## 2014-11-21 DIAGNOSIS — C50812 Malignant neoplasm of overlapping sites of left female breast: Secondary | ICD-10-CM

## 2014-11-21 LAB — COMPREHENSIVE METABOLIC PANEL (CC13)
ALBUMIN: 3.5 g/dL (ref 3.5–5.0)
ALK PHOS: 133 U/L (ref 40–150)
ALT: 57 U/L — AB (ref 0–55)
AST: 77 U/L — AB (ref 5–34)
Anion Gap: 10 mEq/L (ref 3–11)
BILIRUBIN TOTAL: 0.34 mg/dL (ref 0.20–1.20)
BUN: 10.5 mg/dL (ref 7.0–26.0)
CALCIUM: 9.5 mg/dL (ref 8.4–10.4)
CO2: 23 meq/L (ref 22–29)
CREATININE: 0.8 mg/dL (ref 0.6–1.1)
Chloride: 108 mEq/L (ref 98–109)
EGFR: 69 mL/min/{1.73_m2} — ABNORMAL LOW (ref >90)
Glucose: 97 mg/dl (ref 70–140)
POTASSIUM: 4 meq/L (ref 3.5–5.1)
Sodium: 141 mEq/L (ref 136–145)
TOTAL PROTEIN: 6.7 g/dL (ref 6.4–8.3)

## 2014-11-21 LAB — CBC WITH DIFFERENTIAL/PLATELET
BASO%: 0.5 % (ref 0.0–2.0)
Basophils Absolute: 0 10*3/uL (ref 0.0–0.1)
EOS%: 1.4 % (ref 0.0–7.0)
Eosinophils Absolute: 0.1 10*3/uL (ref 0.0–0.5)
HCT: 35 % (ref 34.8–46.6)
HGB: 11.9 g/dL (ref 11.6–15.9)
LYMPH%: 43.2 % (ref 14.0–49.7)
MCH: 34 pg (ref 25.1–34.0)
MCHC: 34 g/dL (ref 31.5–36.0)
MCV: 100 fL (ref 79.5–101.0)
MONO#: 0.7 10*3/uL (ref 0.1–0.9)
MONO%: 8.7 % (ref 0.0–14.0)
NEUT%: 46.2 % (ref 38.4–76.8)
NEUTROS ABS: 3.7 10*3/uL (ref 1.5–6.5)
Platelets: 138 10*3/uL — ABNORMAL LOW (ref 145–400)
RBC: 3.5 10*6/uL — ABNORMAL LOW (ref 3.70–5.45)
RDW: 16 % — ABNORMAL HIGH (ref 11.2–14.5)
WBC: 8.1 10*3/uL (ref 3.9–10.3)
lymph#: 3.5 10*3/uL — ABNORMAL HIGH (ref 0.9–3.3)

## 2014-11-21 MED ORDER — ZOLEDRONIC ACID 4 MG/100ML IV SOLN
4.0000 mg | Freq: Once | INTRAVENOUS | Status: AC
  Administered 2014-11-21: 4 mg via INTRAVENOUS
  Filled 2014-11-21: qty 100

## 2014-11-21 MED ORDER — SODIUM CHLORIDE 0.9 % IV SOLN
Freq: Once | INTRAVENOUS | Status: AC
  Administered 2014-11-21: 14:00:00 via INTRAVENOUS

## 2014-11-21 NOTE — Progress Notes (Signed)
ID: [name]   DOB: 01-Mar-1936  MR#: [id number]  WUJ#:[id number]  PCP: [name], MD GYN: Everlene Farrier, MD SU: OTHER MD: Renato Shin, MD;  Delfin Edis, MD, Janna Arch DDS  CHIEF COMPLAINT:  Metastatic Breast Cancer CURRENT THERAPY: Letrozole, zolendronate   BREAST CANCER HISTORY: From the prior intake note:  The patient's cancer was uncovered by an unusually circuitous route. She presented with stomach upset.  Because of a history of colitis, Dr. Teena Irani who happened to be on call for Dr. Cristina Gong, obtained CT of the abdomen and pelvis on August 30, 2005.  As far as the abdomen and pelvis were concerned, there was no evidence of colitis but there were multiple scattered sclerotic lesions in the bone suggesting possible sclerotic metastatic disease (versus osteopoikilosis.)  Accordingly a bone scan was obtained.  This was done on September 01, 2005, and found tiny sclerotic lesions, and in particular a lesion of concern in the sternum.  Accordingly a CT scan of the chest was obtained.  This found the sternal problem to correspond with degenerative changes. However, it also showed a soft tissue nodule in the left breast.  The patient then had mammograms on September 27, 2005.  This found two areas of spiculation and architectural distortion in the upper outer quadrant of the left breast.  By ultrasound these were hyperechoic and irregular, highly suspicious for carcinoma.  Biopsy was obtained the same day and showed ([id number], as well as [phone]) invasive lobular carcinoma, with both lesions biopsied being ER and PR positive, both herceptest positive, both negative by FISH.  Accordingly the patient was referred to Dr. Georgette Dover and breast MRI was obtained October 06, 2006.  No abnormal enhancement was noted in the right breast.  There was enhancing nodularity in the upper portion of the left breast where several discrete nodules were seen.  Accordingly Dr. Georgette Dover performed a  left modified radical mastectomy on November 02, 2005.  The patient had a positive sentinel lymph node and Dr. Georgette Dover performed a left axillary lymph node dissection.  However, only three additional lymph nodes were recovered, two of which also showed metastatic involvement (all had extra capsular extension.)    The patient's subsequent history is as detailed below  INTERVAL HISTORY: "[name]" returns today for followup of her metastatic breast carcinoma. She is due for zometa today and takes letrozole daily. She tolerates both drugs well with few complaints. She has some vaginal dryness, and her joint aches vary in location. She takes percocet on infrequent occasions. The interval history is significant for an ED visit for abdominal distention/discomfort. She has a history of colitis and is on numerous GI drugs. A CT of the abdomen and pelvis were performed and showed no GI dysfunction or progression of osseous metastasis.   REVIEW OF SYSTEMS: "[name]" denies fevers or chills, she had some nausea during her ED visit and they discharged her home with zofran. She has not needed any since. She has some constipation and plans to start her miralax daily at home. She still feels "full"/distended especially after eating, but she is somewhat better today. She denies shortness of breath, chest pain, or palpitations, but she is fatigued on most day. She has been sleeping well lately, but this is not always the case. She endorses anxiety and depression, and a significant amount of stress. Her husband is in poor health and has had 5 bypasses so far. They argue about who is going to perform household tasks. A  detailed review of systems is otherwise stable.   PAST MEDICAL HISTORY: Past Medical History  Diagnosis Date  . Anxiety   . Asthma   . Breast cancer     metastasized  . Depression   . Diabetes mellitus   . GERD (gastroesophageal reflux disease)   . Hyperlipidemia   . Internal hemorrhoids   . IBS (irritable  bowel syndrome)   . Mitral valve prolapse   . Ischemic colitis   . Fibromyalgia   . Allergy   . Cataract   . S/P radiation therapy within four to twelve weeks 04/26/13-05/11/13    L-spine/sacrum 30Gy/60fx  . Colon polyp 06-Jan-2012    TUBULAR ADENOMA  Significant for diabetes, hypercholesterolemia, asthma, mitral valve prolapse requiring antibiotic prophylaxis before any invasive procedures, Meniere's disease, gastroesophageal reflux disease, osteoarthritis, degenerative disk disease, history of fibromyalgia, panic attacks, history of synovial cyst removal, history of total hysterectomy with bilateral salpingo-oophorectomy, history of septoplasty x2, status post appendectomy, status post dilatation and curettage remotely, status post bilateral carpal tunnel release under Dr. Clair Gulling [place].  PAST SURGICAL HISTORY: Past Surgical History  Procedure Laterality Date  . Mastectomy Left     then treated with chemo and radiation  . Total abdominal hysterectomy    . Back surgery      synovial cyst removed from spine  . Carpal tunnel release Bilateral   . Dilation and curettage of uterus    . Appendectomy    . Cataract extraction Bilateral   . Colonoscopy w/ biopsies  January 06, 2012  . Esophagogastroduodenoscopy  January 06, 2007    normal    FAMILY HISTORY The patient's father died at the age of 49 from a stroke.  The patient's mother died at the age of 17, also from a stroke.  The patient has three brothers; one died with "liver disease."  One has prostate cancer.  The other brother has prostate and colon cancer.  The only breast cancer in the family was the patient's mother's mother and she does not know how old her grandmother was when she was diagnosed.  There is no history of ovarian cancer in the family.  GYNECOLOGIC HISTORY: She is GXP2.  She had her hysterectomy while still menstruating and she took hormone replacement until December 2006, when she had her breast biopsy.  SOCIAL HISTORY: (Updated  02/01/2014) She used to work as a [employer]ar[employer] manager.  Her husband Laverna Peace (who is the son of my former patient Sela Falk) is semiretired, working for a [employer].  What he likes to do is hunt and fish.  Their son Chrissie Noa had significant muscular dystrophy and died in 01-06-07. Their daughter Marcie Bal lives in [place] and works in an office.  The patient has two granddaughters, both RNs, one working at [employer] and the other in [place].    ADVANCED DIRECTIVES:  HEALTH MAINTENANCE:  (Updated 02/01/2014) History  Substance Use Topics  . Smoking status: Never Smoker   . Smokeless tobacco: Never Used  . Alcohol Use: No     Colonoscopy:  July 2013, Dr. Olevia Perches  PAP: s/p hysterectomy  Bone density:  November 2014 at [facility], normal  Lipid panel: Dr. Virgina Jock    Allergies  Allergen Reactions  . Hydrocodone Anxiety    Pt. Said she is fine with this medication  . Metformin And Related     GI sxs  . Morphine And Related   . Niacin Other (See Comments)    Dizziness   . Promethazine Hcl Other (See  Comments)    Dizziness "FELT CRAZY"     Current Outpatient Prescriptions  Medication Sig Dispense Refill  . ACCU-CHEK SMARTVIEW test strip   11  . BYSTOLIC 5 MG tablet Take 1 tablet by mouth Daily.    . Calcium Carbonate (CALCIUM 600 PO) Take 1 tablet by mouth daily.    . Cholecalciferol (VITAMIN D) 1000 UNITS capsule Take 1,000 Units by mouth 2 (two) times daily.     . clonazePAM (KLONOPIN) 0.5 MG tablet Take 0.5 mg by mouth 2 (two) times daily as needed for anxiety.     . CRESTOR 5 MG tablet Take 1 tablet by mouth Daily.    Marland Kitchen esomeprazole (NEXIUM) 40 MG capsule Take 1 capsule (40 mg total) by mouth daily. 90 capsule 3  . glimepiride (AMARYL) 1 MG tablet Take 1 mg by mouth daily.     . hydrochlorothiazide (HYDRODIURIL) 25 MG tablet Take 12.5 mg by mouth every Monday, Wednesday, and Friday.     Marland Kitchen letrozole (FEMARA) 2.5 MG tablet TAKE 1 TABLET (2.5 MG TOTAL) BY MOUTH DAILY. 90  tablet 4  . metoCLOPramide (REGLAN) 5 MG tablet Take 1 tablet (5 mg total) by mouth every 8 (eight) hours as needed for nausea. 30 tablet 1  . omeprazole (PRILOSEC) 40 MG capsule Take 40 mg by mouth daily.    . ondansetron (ZOFRAN) 4 MG tablet Take 1 tablet (4 mg total) by mouth every 6 (six) hours. 12 tablet 0  . ONGLYZA 5 MG TABS tablet Take 5 mg by mouth daily.     Bertram Gala Glycol-Propyl Glycol (SYSTANE OP) Apply 1-2 drops to eye daily as needed (dry eyes).     Marland Kitchen PROAIR HFA 108 (90 BASE) MCG/ACT inhaler Inhale 1-2 puffs into the lungs every 6 (six) hours as needed for wheezing or shortness of breath.     Marland Kitchen QVAR 80 MCG/ACT inhaler Inhale 2 puffs into the lungs 2 (two) times daily.     Marland Kitchen acetaminophen (TYLENOL) 500 MG tablet Take 500-1,000 mg by mouth every 6 (six) hours as needed for mild pain or moderate pain.     . ALREX 0.2 % SUSP Place 1 drop into both eyes daily.   0  . GLYCERIN ADULT 2 G suppository Place 1 suppository rectally daily as needed for mild constipation.     . hydrocortisone (ANUSOL-HC) 2.5 % rectal cream Place 1 application rectally daily as needed for hemorrhoids or itching.     . lidocaine-hydrocortisone (ANAMANTEL HC) 3-0.5 % CREA Place 1 Applicatorful rectally 2 (two) times daily as needed for hemorrhoids and bleeding. (Patient not taking: Reported on 11/21/2014) 28.35 g 0  . meclizine (ANTIVERT) 25 MG tablet Take 25 mg by mouth as needed for dizziness.     Marland Kitchen oxyCODONE-acetaminophen (PERCOCET/ROXICET) 5-325 MG per tablet Take 1 tablet by mouth every 6 (six) hours as needed for severe pain. (Patient not taking: Reported on 11/21/2014) 60 tablet 0  . sucralfate (CARAFATE) 1 G tablet Take 1 g by mouth 4 (four) times daily.    Marland Kitchen triamterene-hydrochlorothiazide (MAXZIDE) 75-50 MG per tablet Take 0.5 tablets by mouth 3 (three) times a week.   2   No current facility-administered medications for this visit.    OBJECTIVE: Middle-aged white woman in no acute distress Filed  Vitals:   11/21/14 1302  BP: 138/63  Pulse: 68  Temp: 97.8 F (36.6 C)  Resp: 18     Body mass index is 24.57 kg/(m^2).    ECOG FS: 1  Filed Weights   11/21/14 1302  Weight: 143 lb 3.2 oz (64.955 kg)    Skin: warm, dry  HEENT: sclerae anicteric, conjunctivae pink, oropharynx clear. No thrush or mucositis.  Lymph Nodes: No cervical or supraclavicular lymphadenopathy  Lungs: clear to auscultation bilaterally, no rales, wheezes, or rhonci  Heart: regular rate and rhythm  Abdomen: round, soft, non tender, positive bowel sounds  Musculoskeletal: No focal spinal tenderness, no peripheral edema  Neuro: non focal, well oriented, positive affect  Breasts: left breast status post mastectomy. No evidence of local recurrence. Left axilla benign. Right breast unremarkable.  LAB RESULTS: Lab Results  Component Value Date   WBC 8.1 11/21/2014   NEUTROABS 3.7 11/21/2014   HGB 11.9 11/21/2014   HCT 35.0 11/21/2014   MCV 100.0 11/21/2014   PLT 138* 11/21/2014      Chemistry      Component Value Date/Time   NA 141 11/21/2014 1231   NA 140 11/15/2014 2131   K 4.0 11/21/2014 1231   K 3.5 11/15/2014 2131   CL 104 11/15/2014 2131   CL 103 03/22/2013 0754   CO2 23 11/21/2014 1231   CO2 26 11/15/2014 2131   BUN 10.5 11/21/2014 1231   BUN 13 11/15/2014 2131   CREATININE 0.8 11/21/2014 1231   CREATININE 0.65 11/15/2014 2131      Component Value Date/Time   CALCIUM 9.5 11/21/2014 1231   CALCIUM 9.6 11/15/2014 2131   ALKPHOS 133 11/21/2014 1231   ALKPHOS 129* 11/15/2014 2338   AST 77* 11/21/2014 1231   AST 72* 11/15/2014 2338   ALT 57* 11/21/2014 1231   ALT 39* 11/15/2014 2338   BILITOT 0.34 11/21/2014 1231   BILITOT 0.6 11/15/2014 2338       STUDIES: Ct Abdomen Pelvis W Contrast  11/16/2014   CLINICAL DATA:  Upper abdominal pain, belching and bloating beginning this morning. Nausea and diarrhea. Anorexia for 1 day. History of metastatic breast cancer.  EXAM: CT ABDOMEN AND  PELVIS WITH CONTRAST  TECHNIQUE: Multidetector CT imaging of the abdomen and pelvis was performed using the standard protocol following bolus administration of intravenous contrast.  CONTRAST:  166mL OMNIPAQUE IOHEXOL 300 MG/ML  SOLN  COMPARISON:  CT of the abdomen and pelvis Feb 25, 2014  FINDINGS: LUNG BASES: Included view of the lung bases partially imaged 4 mm sub solid pulmonary nodule, not definitely present previously. Visualized heart and pericardium are unremarkable. Status post LEFT mastectomy.  SOLID ORGANS: The liver, spleen, gallbladder, pancreas and adrenal glands are unremarkable.  GASTROINTESTINAL TRACT: The stomach, small and large bowel are normal in course and caliber without inflammatory changes. Normal appendix.  KIDNEYS/ URINARY TRACT: Kidneys are orthotopic, demonstrating symmetric enhancement. 3 mm RIGHT upper pole nephrolithiasis, stable mild RIGHT hydronephrosis . No solid renal masses. The unopacified ureters are normal in course and caliber. Delayed imaging through the kidneys demonstrates symmetric prompt contrast excretion within the proximal urinary collecting system. Urinary bladder is partially distended and unremarkable.  PERITONEUM/RETROPERITONEUM: No intraperitoneal free fluid nor free air. Aortoiliac vessels are normal in course and caliber, mild calcific atherosclerosis. No lymphadenopathy by CT size criteria. Status post hysterectomy. Scattered granuloma.  SOFT TISSUE/OSSEOUS STRUCTURES: Diffuse osseous metastasis without pathologic fracture. Severe L2-3 degenerative disc, disc osteophyte complex resulting in mild canal stenosis.  IMPRESSION: No acute intra-abdominal or pelvic process.  3 mm RIGHT upper pole nephrolithiasis, mild RIGHT hydronephrosis, unchanged.  Diffuse osseous metastasis without pathologic fracture.   Electronically Signed   By: Elon Alas  On: 11/16/2014 00:25    ASSESSMENT:78 y.o.  [place] woman    (1) status post left modified radical  mastectomy in January 2007 for a T1c N1, stage IIA  invasive lobular carcinoma, grade 1, strongly estrogen and progesterone receptor positive, HER-2 negative, with an MIB-1 of 7%.  (2)  Status post radiation given concurrently with Capecitabine.  Declined chemotherapy.    (3) Status post anastrozole and exemestane, both discontinued due to aches and pains.  Status post tamoxifen between October 2007 until August 2011, discontinued secondary to cramps   (4) multiple sclerotic bony lesions noted at initial diagnosis, biopsied x2 August 2009 without malignancy documented, on zoledronic acid annually starting 12/12/2007. 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  (8) received zoledronic acid monthly basis beginning September 2014, after May 2015 switched to denosumab, switched back to zolendronate 05/23/2014 to be given every 12 weeks (next dose due OCT 29)  PLAN:  Clytie is far today. The labs were reviewed in detail and were relatively stable, she is displaying some mild transaminitis today. We will continue to monitor these values. As stated previously however, she had a chest CT that showed no changes in the liver. Her CA 27.29 was not available for review at this time. Cornell is tolerating the letrozole well and will continue this daily. She is due for zometa today, and will have this infusion immediately after our visit.  Melaya has a follow up visit with her PCP next Friday to address her GI concerns.  Zane will return for labs, a follow up visit, and her next zometa infusion in 3 months. She understands and agrees with this plan. She understands the goal of treatment in her case is  control. She has been encouraged to call with any issues that might arise before her next visit here.   Marcelino Duster, NP     11/21/2014

## 2014-11-21 NOTE — Patient Instructions (Signed)

## 2014-11-21 NOTE — Telephone Encounter (Signed)
per pof to sch pt appt-sent MW email to sch pt trmt-gave pt copy of sch °

## 2014-11-22 ENCOUNTER — Telehealth: Payer: Self-pay | Admitting: *Deleted

## 2014-11-22 LAB — CANCER ANTIGEN 27.29: CA 27.29: 436 U/mL — AB (ref 0–39)

## 2014-11-22 NOTE — Telephone Encounter (Signed)
Per staff message and POF I have scheduled appts. Advised scheduler of appts. JMW  

## 2014-11-29 ENCOUNTER — Other Ambulatory Visit: Payer: Self-pay | Admitting: Internal Medicine

## 2014-11-29 ENCOUNTER — Telehealth: Payer: Self-pay | Admitting: *Deleted

## 2014-11-29 DIAGNOSIS — R740 Nonspecific elevation of levels of transaminase and lactic acid dehydrogenase [LDH]: Principal | ICD-10-CM

## 2014-11-29 DIAGNOSIS — R7401 Elevation of levels of liver transaminase levels: Secondary | ICD-10-CM

## 2014-11-29 NOTE — Telephone Encounter (Signed)
Pt called to this RN to state she has " been feeling real sick for the past month "  Jamie [name] went to her primary MD who reviewed her labs and noted "my labs were elevated but he couldn't explain why but he is getting an U/S because of concerns previously with my liver and me not feeling good ".  This RN reviewed chart and noted abnormal lab- discussed above and pt's current symptoms of general malaise and feelings of nausea without vomiting.  Plan per call is pt will proceed with U/S as scheduled. Appointment made for follow up with MD post U/S to discuss recent abnormal labs, current symptoms and results of U/S.  Pt verbalized understanding of plan and appointment date and time as well as appreciation for above.

## 2014-12-01 ENCOUNTER — Other Ambulatory Visit: Payer: Self-pay | Admitting: Oncology

## 2014-12-02 ENCOUNTER — Telehealth: Payer: Self-pay

## 2014-12-02 NOTE — Telephone Encounter (Signed)
Pt called to clarify appts. She is having an [place] abd on 2/18 and f/u with GM 2/23 to go over results. She is concerned about a dry cough she has had for months. A concerning spot on lung was seen in prior scans. Is there any reason for the cough? Is there any test she can have done before her appt w/GM so they get this looked at.

## 2014-12-03 ENCOUNTER — Telehealth: Payer: Self-pay

## 2014-12-03 DIAGNOSIS — C50919 Malignant neoplasm of unspecified site of unspecified female breast: Secondary | ICD-10-CM

## 2014-12-03 DIAGNOSIS — C7951 Secondary malignant neoplasm of bone: Principal | ICD-10-CM

## 2014-12-03 NOTE — Telephone Encounter (Signed)
lvm that Dr Jana Hakim is ordering a CXR and she can come in the day before or at the time of her ultrasound to get it done at [place]

## 2014-12-03 NOTE — Telephone Encounter (Signed)
MD order CXR prior to visit.

## 2014-12-03 NOTE — Telephone Encounter (Signed)
Please schedule CXR PA and LAT prior to visit  Thanks!

## 2014-12-05 ENCOUNTER — Ambulatory Visit
Admission: RE | Admit: 2014-12-05 | Discharge: 2014-12-05 | Disposition: A | Discharge status: Home / Self Care | Payer: Commercial Managed Care - HMO | Source: Ambulatory Visit | Attending: Oncology | Admitting: Oncology

## 2014-12-05 ENCOUNTER — Ambulatory Visit
Admission: RE | Admit: 2014-12-05 | Discharge: 2014-12-05 | Disposition: A | Discharge status: Home / Self Care | Payer: Commercial Managed Care - HMO | Source: Ambulatory Visit | Attending: Internal Medicine | Admitting: Internal Medicine

## 2014-12-05 DIAGNOSIS — R740 Nonspecific elevation of levels of transaminase and lactic acid dehydrogenase [LDH]: Principal | ICD-10-CM

## 2014-12-05 DIAGNOSIS — C7951 Secondary malignant neoplasm of bone: Principal | ICD-10-CM

## 2014-12-05 DIAGNOSIS — R7401 Elevation of levels of liver transaminase levels: Secondary | ICD-10-CM

## 2014-12-05 DIAGNOSIS — C50919 Malignant neoplasm of unspecified site of unspecified female breast: Secondary | ICD-10-CM

## 2014-12-06 NOTE — Telephone Encounter (Signed)
None

## 2014-12-10 ENCOUNTER — Telehealth: Payer: Self-pay | Admitting: Oncology

## 2014-12-10 ENCOUNTER — Ambulatory Visit (HOSPITAL_BASED_OUTPATIENT_CLINIC_OR_DEPARTMENT_OTHER): Payer: Commercial Managed Care - HMO | Admitting: Oncology

## 2014-12-10 VITALS — BP 142/60 | HR 81 | Temp 98.6°F | Resp 18 | Ht 64.0 in | Wt 146.6 lb

## 2014-12-10 DIAGNOSIS — C50919 Malignant neoplasm of unspecified site of unspecified female breast: Secondary | ICD-10-CM

## 2014-12-10 DIAGNOSIS — E119 Type 2 diabetes mellitus without complications: Secondary | ICD-10-CM

## 2014-12-10 DIAGNOSIS — I10 Essential (primary) hypertension: Secondary | ICD-10-CM

## 2014-12-10 DIAGNOSIS — C7951 Secondary malignant neoplasm of bone: Secondary | ICD-10-CM

## 2014-12-10 DIAGNOSIS — C50212 Malignant neoplasm of upper-inner quadrant of left female breast: Secondary | ICD-10-CM

## 2014-12-10 DIAGNOSIS — C50812 Malignant neoplasm of overlapping sites of left female breast: Secondary | ICD-10-CM

## 2014-12-10 NOTE — Telephone Encounter (Signed)
, °

## 2014-12-10 NOTE — Progress Notes (Signed)
ID: Jamie [name]   DOB: 03/24/1936  MR#: [id number]  NOB#:[id number]  PCP: [name], MD GYN: Everlene Farrier, MD SU: OTHER MD: Renato Shin, MD;  Delfin Edis, MD, Janna Arch DDS  CHIEF COMPLAINT:  Metastatic Breast Cancer CURRENT THERAPY: Letrozole, zolendronate   BREAST CANCER HISTORY: From the prior intake note:  The patient's cancer was uncovered by an unusually circuitous route. She presented with stomach upset.  Because of a history of colitis, Dr. Teena Irani who happened to be on call for Dr. Cristina Gong, obtained CT of the abdomen and pelvis on August 30, 2005.  As far as the abdomen and pelvis were concerned, there was no evidence of colitis but there were multiple scattered sclerotic lesions in the bone suggesting possible sclerotic metastatic disease (versus osteopoikilosis.)  Accordingly a bone scan was obtained.  This was done on September 01, 2005, and found tiny sclerotic lesions, and in particular a lesion of concern in the sternum.  Accordingly a CT scan of the chest was obtained.  This found the sternal problem to correspond with degenerative changes. However, it also showed a soft tissue nodule in the left breast.  The patient then had mammograms on September 27, 2005.  This found two areas of spiculation and architectural distortion in the upper outer quadrant of the left breast.  By ultrasound these were hyperechoic and irregular, highly suspicious for carcinoma.  Biopsy was obtained the same day and showed ([id number], as well as [phone]) invasive lobular carcinoma, with both lesions biopsied being ER and PR positive, both herceptest positive, both negative by FISH.  Accordingly the patient was referred to Dr. Georgette Dover and breast MRI was obtained October 06, 2006.  No abnormal enhancement was noted in the right breast.  There was enhancing nodularity in the upper portion of the left breast where several discrete nodules were seen.  Accordingly Dr. Georgette Dover performed a  left modified radical mastectomy on November 02, 2005.  The patient had a positive sentinel lymph node and Dr. Georgette Dover performed a left axillary lymph node dissection.  However, only three additional lymph nodes were recovered, two of which also showed metastatic involvement (all had extra capsular extension.)    The patient's subsequent history is as detailed below  INTERVAL HISTORY: "Jamie [name]" returns today for followup of her metastatic breast carcinoma. Since her last visit here she was evaluated in the emergency room, 10/19/2014, with the note from the physician they're mentioning ear pain, but she tells me was really her neck, her right hip, and her stomach that bothered her. They had significant problems with axis, "staffed my right arm several times", and she ended up going home without resolution. She later saw her primary care physician, and he obtained an abdominal CT, which showed no active disease, of course excepting her bones, where she has known metastases. She continues on letrozole, with good tolerance, and also on zolendronate every 3 months, with no acute symptoms associated with that infusion.   REVIEW OF SYSTEMS: "Jamie [name]" tells me she lost a lot of weight but has gained it back. Her appetite is told. She has no energy. At home she just cannot sits around and can't really get her house work done. Her husband is now at home "a lot", after his bypass surgery. He "can't work anymore". There is continuing stress that she describes in the marriage. Currently her bowel movements are normal, she has non-yesterday, normal 1 this morning in a normal 12 days ago. She still has  a cough, which rarely is productive. He has been no hemoptysis or pleurisy. She complains of hearing problems, sinus issues, difficulty swallowing, shortness of breath, heartburn, abdominal roilinjg, back and joint pain, anxiety, and depression. She tells me her sugars are "okay". A detailed review of systems today was otherwise  stable  PAST MEDICAL HISTORY: Past Medical History  Diagnosis Date  . Anxiety   . Asthma   . Breast cancer     metastasized  . Depression   . Diabetes mellitus   . GERD (gastroesophageal reflux disease)   . Hyperlipidemia   . Internal hemorrhoids   . IBS (irritable bowel syndrome)   . Mitral valve prolapse   . Ischemic colitis   . Fibromyalgia   . Allergy   . Cataract   . S/P radiation therapy within four to twelve weeks 04/26/13-05/11/13    L-spine/sacrum 30Gy/68fx  . Colon polyp 2012-01-14    TUBULAR ADENOMA  Significant for diabetes, hypercholesterolemia, asthma, mitral valve prolapse requiring antibiotic prophylaxis before any invasive procedures, Meniere's disease, gastroesophageal reflux disease, osteoarthritis, degenerative disk disease, history of fibromyalgia, panic attacks, history of synovial cyst removal, history of total hysterectomy with bilateral salpingo-oophorectomy, history of septoplasty x2, status post appendectomy, status post dilatation and curettage remotely, status post bilateral carpal tunnel release under Dr. Clair Gulling [place].  PAST SURGICAL HISTORY: Past Surgical History  Procedure Laterality Date  . Mastectomy Left     then treated with chemo and radiation  . Total abdominal hysterectomy    . Back surgery      synovial cyst removed from spine  . Carpal tunnel release Bilateral   . Dilation and curettage of uterus    . Appendectomy    . Cataract extraction Bilateral   . Colonoscopy w/ biopsies  01/14/12  . Esophagogastroduodenoscopy  01-14-2007    normal    FAMILY HISTORY The patient's father died at the age of 27 from a stroke.  The patient's mother died at the age of 53, also from a stroke.  The patient has three brothers; one died with "liver disease."  One has prostate cancer.  The other brother has prostate and colon cancer.  The only breast cancer in the family was the patient's mother's mother and she does not know how old her grandmother was when she was  diagnosed.  There is no history of ovarian cancer in the family.  GYNECOLOGIC HISTORY: She is GXP2.  She had her hysterectomy while still menstruating and she took hormone replacement until December 2006, when she had her breast biopsy.  SOCIAL HISTORY: (Updated 02/01/2014) She used to work as a [employer]ar[employer] manager.  Her husband Laverna Peace (who is the son of my former patient Deniece Rankin) is semiretired, occasionally working for a Forensic scientist.  What he likes to do is hunt and fish.  Their son Chrissie Noa had significant muscular dystrophy and died in 2007-01-14. Their daughter Marcie Bal lives in [place] and works in an office.  The patient has two granddaughters, both RNs, one working at [employer] and the other in [place].    ADVANCED DIRECTIVES: Not in place  HEALTH MAINTENANCE:  (Updated 02/01/2014) History  Substance Use Topics  . Smoking status: Never Smoker   . Smokeless tobacco: Never Used  . Alcohol Use: No     Colonoscopy:  July 2013, Dr. Olevia Perches  PAP: s/p hysterectomy  Bone density:  November 2014 at [facility], normal  Lipid panel: Dr. Virgina Jock    Allergies  Allergen Reactions  .  Hydrocodone Anxiety    Pt. Said she is fine with this medication  . Metformin And Related     GI sxs  . Morphine And Related   . Niacin Other (See Comments)    Dizziness   . Promethazine Hcl Other (See Comments)    Dizziness "FELT CRAZY"     Current Outpatient Prescriptions  Medication Sig Dispense Refill  . ACCU-CHEK SMARTVIEW test strip   11  . acetaminophen (TYLENOL) 500 MG tablet Take 500-1,000 mg by mouth every 6 (six) hours as needed for mild pain or moderate pain.     . ALREX 0.2 % SUSP Place 1 drop into both eyes daily.   0  . BYSTOLIC 5 MG tablet Take 1 tablet by mouth Daily.    . Calcium Carbonate (CALCIUM 600 PO) Take 1 tablet by mouth daily.    . Cholecalciferol (VITAMIN D) 1000 UNITS capsule Take 1,000 Units by mouth 2 (two) times daily.     . clonazePAM (KLONOPIN) 0.5 MG tablet  Take 0.5 mg by mouth 2 (two) times daily as needed for anxiety.     . CRESTOR 5 MG tablet Take 1 tablet by mouth Daily.    Marland Kitchen esomeprazole (NEXIUM) 40 MG capsule Take 1 capsule (40 mg total) by mouth daily. 90 capsule 3  . glimepiride (AMARYL) 1 MG tablet Take 1 mg by mouth daily.     Marland Kitchen GLYCERIN ADULT 2 G suppository Place 1 suppository rectally daily as needed for mild constipation.     . hydrochlorothiazide (HYDRODIURIL) 25 MG tablet Take 12.5 mg by mouth every Monday, Wednesday, and Friday.     . hydrocortisone (ANUSOL-HC) 2.5 % rectal cream Place 1 application rectally daily as needed for hemorrhoids or itching.     . letrozole (FEMARA) 2.5 MG tablet TAKE 1 TABLET (2.5 MG TOTAL) BY MOUTH DAILY. 90 tablet 4  . lidocaine-hydrocortisone (ANAMANTEL HC) 3-0.5 % CREA Place 1 Applicatorful rectally 2 (two) times daily as needed for hemorrhoids and bleeding. (Patient not taking: Reported on 11/21/2014) 28.35 g 0  . meclizine (ANTIVERT) 25 MG tablet Take 25 mg by mouth as needed for dizziness.     . metoCLOPramide (REGLAN) 5 MG tablet Take 1 tablet (5 mg total) by mouth every 8 (eight) hours as needed for nausea. 30 tablet 1  . omeprazole (PRILOSEC) 40 MG capsule Take 40 mg by mouth daily.    . ondansetron (ZOFRAN) 4 MG tablet Take 1 tablet (4 mg total) by mouth every 6 (six) hours. 12 tablet 0  . ONGLYZA 5 MG TABS tablet Take 5 mg by mouth daily.     Marland Kitchen oxyCODONE-acetaminophen (PERCOCET/ROXICET) 5-325 MG per tablet Take 1 tablet by mouth every 6 (six) hours as needed for severe pain. (Patient not taking: Reported on 11/21/2014) 60 tablet 0  . Polyethyl Glycol-Propyl Glycol (SYSTANE OP) Apply 1-2 drops to eye daily as needed (dry eyes).     Marland Kitchen PROAIR HFA 108 (90 BASE) MCG/ACT inhaler Inhale 1-2 puffs into the lungs every 6 (six) hours as needed for wheezing or shortness of breath.     Marland Kitchen QVAR 80 MCG/ACT inhaler Inhale 2 puffs into the lungs 2 (two) times daily.     . sucralfate (CARAFATE) 1 G tablet Take 1 g by  mouth 4 (four) times daily.    Marland Kitchen triamterene-hydrochlorothiazide (MAXZIDE) 75-50 MG per tablet Take 0.5 tablets by mouth 3 (three) times a week.   2   No current facility-administered medications for this visit.  OBJECTIVE: Middle-aged white woman who appears stated age 47 Vitals:   12/10/14 0852  BP: 142/60  Pulse: 81  Temp: 98.6 F (37 C)  Resp: 18     Body mass index is 25.15 kg/(m^2).    ECOG FS: 1 Filed Weights   12/10/14 0852  Weight: 146 lb 9.6 oz (66.497 kg)    Sclerae unicteric, pupils equal and reactive, EOMs intact Oropharynx clear and moist-- no thrush or other lesions No cervical or supraclavicular adenopathy Lungs no rales or rhonchi Heart regular rate and rhythm Abd soft, nontender, positive bowel sounds, no masses palpated MSK no focal spinal tenderness, no upper extremity lymphedema Neuro: nonfocal, well oriented, appropriate affect Breasts: The right breast is unremarkable. The left breast is status post mastectomy and radiation. There are some telangiectasias as expected. There is no evidence of chest wall recurrence. The left axilla is benign.   LAB RESULTS: Lab Results  Component Value Date   WBC 8.1 11/21/2014   NEUTROABS 3.7 11/21/2014   HGB 11.9 11/21/2014   HCT 35.0 11/21/2014   MCV 100.0 11/21/2014   PLT 138* 11/21/2014      Chemistry      Component Value Date/Time   NA 141 11/21/2014 1231   NA 140 11/15/2014 2131   K 4.0 11/21/2014 1231   K 3.5 11/15/2014 2131   CL 104 11/15/2014 2131   CL 103 03/22/2013 0754   CO2 23 11/21/2014 1231   CO2 26 11/15/2014 2131   BUN 10.5 11/21/2014 1231   BUN 13 11/15/2014 2131   CREATININE 0.8 11/21/2014 1231   CREATININE 0.65 11/15/2014 2131      Component Value Date/Time   CALCIUM 9.5 11/21/2014 1231   CALCIUM 9.6 11/15/2014 2131   ALKPHOS 133 11/21/2014 1231   ALKPHOS 129* 11/15/2014 2338   AST 77* 11/21/2014 1231   AST 72* 11/15/2014 2338   ALT 57* 11/21/2014 1231   ALT 39*  11/15/2014 2338   BILITOT 0.34 11/21/2014 1231   BILITOT 0.6 11/15/2014 2338       STUDIES: Dg Chest 2 View  12/05/2014   CLINICAL DATA:  Nonproductive cough for the past several months, nonsmoker, history of BIRT breast malignancy with metastatic disease to the bone  EXAM: CHEST  2 VIEW  COMPARISON:  Chest x-ray of August 31, 2014  FINDINGS: The lungs are well-expanded. There is no focal infiltrate. No pulmonary parenchymal masses are demonstrated. The heart and pulmonary vascularity are normal. The mediastinum is normal in width. There is no pleural effusion or pneumothorax. Multiple sclerotic metastatic foci are noted within the vertebral bodies. There is no compression fracture.  IMPRESSION: There is no radiographic evidence of active cardiopulmonary disease. However, given the patient's persistent cough and known metastatic breast malignancy to bone, chest CT scanning is recommended.   Electronically Signed   By: David  Martinique   On: 12/05/2014 11:14   US Abdomen Complete  12/05/2014   CLINICAL DATA:  Elevated LFTs. Severe nausea. Symptoms with eating. Epigastric and left lower quadrant pain for 2 months. Metastatic breast cancer.  EXAM: ULTRASOUND ABDOMEN COMPLETE  COMPARISON:  CT of the abdomen and pelvis on 11/16/2014  FINDINGS: Gallbladder: Gallbladder has a normal appearance. Gallbladder wall is 1.9 mm, within normal limits. No stones or pericholecystic fluid. No sonographic Murphy's sign.  Common bile duct: Diameter: 3.3 mm  Liver: No focal lesion identified. Within normal limits in parenchymal echogenicity.  IVC: No abnormality visualized.  Pancreas: Visualized portion unremarkable.  Spleen: Size and  appearance within normal limits.  Right Kidney: Length: 10.8 cm. Echogenicity within normal limits. No mass visualized. Extrarenal pelvis noted.  Left Kidney: Length: 9.9 cm. Echogenicity within normal limits. No mass or hydronephrosis visualized.  Abdominal aorta: No aneurysm visualized.  Other  findings: None.  IMPRESSION: 1. No evidence for acute cholecystitis. 2.  No evidence for metastatic lesions of the abdomen.   Electronically Signed   By: Nolon Nations [name].   On: 12/05/2014 09:43   Ct Abdomen Pelvis W Contrast  11/16/2014   CLINICAL DATA:  Upper abdominal pain, belching and bloating beginning this morning. Nausea and diarrhea. Anorexia for 1 day. History of metastatic breast cancer.  EXAM: CT ABDOMEN AND PELVIS WITH CONTRAST  TECHNIQUE: Multidetector CT imaging of the abdomen and pelvis was performed using the standard protocol following bolus administration of intravenous contrast.  CONTRAST:  174mL OMNIPAQUE IOHEXOL 300 MG/ML  SOLN  COMPARISON:  CT of the abdomen and pelvis Feb 25, 2014  FINDINGS: LUNG BASES: Included view of the lung bases partially imaged 4 mm sub solid pulmonary nodule, not definitely present previously. Visualized heart and pericardium are unremarkable. Status post LEFT mastectomy.  SOLID ORGANS: The liver, spleen, gallbladder, pancreas and adrenal glands are unremarkable.  GASTROINTESTINAL TRACT: The stomach, small and large bowel are normal in course and caliber without inflammatory changes. Normal appendix.  KIDNEYS/ URINARY TRACT: Kidneys are orthotopic, demonstrating symmetric enhancement. 3 mm RIGHT upper pole nephrolithiasis, stable mild RIGHT hydronephrosis . No solid renal masses. The unopacified ureters are normal in course and caliber. Delayed imaging through the kidneys demonstrates symmetric prompt contrast excretion within the proximal urinary collecting system. Urinary bladder is partially distended and unremarkable.  PERITONEUM/RETROPERITONEUM: No intraperitoneal free fluid nor free air. Aortoiliac vessels are normal in course and caliber, mild calcific atherosclerosis. No lymphadenopathy by CT size criteria. Status post hysterectomy. Scattered granuloma.  SOFT TISSUE/OSSEOUS STRUCTURES: Diffuse osseous metastasis without pathologic fracture. Severe L2-3  degenerative disc, disc osteophyte complex resulting in mild canal stenosis.  IMPRESSION: No acute intra-abdominal or pelvic process.  3 mm RIGHT upper pole nephrolithiasis, mild RIGHT hydronephrosis, unchanged.  Diffuse osseous metastasis without pathologic fracture.   Electronically Signed   By: Elon Alas   On: 11/16/2014 00:25    ASSESSMENT:78 y.o.  [place] woman    (1) status post left modified radical mastectomy in January 2007 for a T1c N1, stage IIA  invasive lobular carcinoma, grade 1, strongly estrogen and progesterone receptor positive, HER-2 negative, with an MIB-1 of 7%.  (2)  Status post radiation given concurrently with Capecitabine.  Declined chemotherapy.    (3) Status post anastrozole and exemestane, both discontinued due to aches and pains.  Status post tamoxifen between October 2007 until August 2011, discontinued secondary to cramps   (4) multiple sclerotic bony lesions noted at initial diagnosis, biopsied x2 August 2009 without malignancy documented, on zoledronic acid annually starting 12/12/2007. Increased to monthly starting 07/17/2013  (5) On fulvestrant between September 2011 and 05/17/2013 with likely progression of bony disease  (6)  radiation to the lumbosacral spine and right hip to treat right hip pain to 30 gray completed 68/34/1962, complicated by [possible] radiation induced colitis  (7)  on letrozole since September 2014.  (8) received zoledronic acid monthly basis beginning September 2014, after May 2015 switched to denosumab, switched back to zolendronate 05/23/2014 to be given every 12 weeks (most recent dose to 02/05/2015)  PLAN:  Jamie [name] is generally stable as far as her stage IV breast cancer is  concerned. I told her she "looks terrific considering how sick she is". It is reassuring that she had a CT of the abdomen late January, and more recently an abdominal ultrasound and chest x-ray, which showed no evidence of parenchymal disease outside the  bones. The bone lesions as far as I can tell seem to be stable.  She is tolerating the letrozole well and is alendronate without any side effects that she is aware of. Where going to continue both those medications. Her next zolendronate dose will be May 11. She will have a bone scan before that so we can follow her bone lesions, which is her only site of active disease.  She tells me that my 38 assistant is "okay" but she would prefer to see me, and since we only see her every 3 months I think that is doable.  And has a good understanding of the overall plan. She agrees with it. She knows the goal of treatment in her case is control. She will call with any problems that may develop before her next visit here.  Chauncey Cruel, MD     12/10/2014

## 2014-12-10 NOTE — Addendum Note (Signed)
Addended by: Laureen Abrahams on: 12/10/2014 05:22 PM   Modules accepted: Orders, Medications

## 2014-12-27 ENCOUNTER — Telehealth: Payer: Self-pay | Admitting: *Deleted

## 2014-12-27 NOTE — Telephone Encounter (Signed)
SHE IS HAVING PAIN ALL OVER HER BODY "DUE TO CANCER IN HER BONES". PT.'S HURTS IN HER NECK, LEGS, AND BACK. SHE TAKES TYLENOL WHICH HELPS. PT. IS SLIGHTLY SHORT OF BREATH EVEN AT REST. WHEN PT. SITS DOWN SHE FALLS ASLEEP. PT. FEELS SHE IS "UNABLE TO GO". PT. IS CONCERNED WITH THE RESULTS OF HER CA27.29. PT. BEGINS TO CRY AND WANTS TO TALK TO VAL.

## 2014-12-27 NOTE — Telephone Encounter (Signed)
This RN returned call to pt who proceeded to state " I have never felt this bad and just do not have any energy to do anything "  [place] states pain is continuing in her neck, back and legs. She has " shortness of breath that keeps me from doing much of anything "  Per discussion pt stated " I just feel like I am going downhill and is it the cancer or something else ?"  Plan per conversation is scans will be moved up for evaluation.  CT of chest will be obtained to evaluate lung status per above concern.  Pt verbalized understanding of plan with request " do not schedule me for anything next Friday because I have an eye appointment that day ".

## 2014-12-27 NOTE — Telephone Encounter (Signed)
See other entry for this date 

## 2015-01-16 ENCOUNTER — Telehealth: Payer: Self-pay | Admitting: *Deleted

## 2015-01-16 NOTE — Telephone Encounter (Signed)
Received voice message from patient stating,"I talked with Val 3 weeks ago to be examined and never heard anything back. Can you please have Val call me. Return number is [phone]."

## 2015-01-17 ENCOUNTER — Other Ambulatory Visit: Payer: Self-pay | Admitting: *Deleted

## 2015-01-17 NOTE — Telephone Encounter (Signed)
This RN spoke with pt per her call- apologies given regarding appointment request not followed thru.  Overall pt states she is " about the same "  This RN while on the phone with pt scheduled appointment for discussion with MD- of note pt declines to see mid level but specifically request MD.  Per conversation pt stated appreciation for discussion and scheduling of appointment.

## 2015-01-21 ENCOUNTER — Telehealth: Payer: Self-pay | Admitting: Oncology

## 2015-01-21 ENCOUNTER — Ambulatory Visit (HOSPITAL_BASED_OUTPATIENT_CLINIC_OR_DEPARTMENT_OTHER): Payer: Commercial Managed Care - HMO | Admitting: Oncology

## 2015-01-21 ENCOUNTER — Other Ambulatory Visit (HOSPITAL_BASED_OUTPATIENT_CLINIC_OR_DEPARTMENT_OTHER): Payer: Commercial Managed Care - HMO

## 2015-01-21 VITALS — BP 144/53 | HR 79 | Temp 97.9°F | Resp 18 | Ht 64.0 in | Wt 142.4 lb

## 2015-01-21 DIAGNOSIS — J4530 Mild persistent asthma, uncomplicated: Secondary | ICD-10-CM

## 2015-01-21 DIAGNOSIS — I10 Essential (primary) hypertension: Secondary | ICD-10-CM

## 2015-01-21 DIAGNOSIS — M542 Cervicalgia: Secondary | ICD-10-CM

## 2015-01-21 DIAGNOSIS — C50919 Malignant neoplasm of unspecified site of unspecified female breast: Secondary | ICD-10-CM

## 2015-01-21 DIAGNOSIS — C7951 Secondary malignant neoplasm of bone: Secondary | ICD-10-CM

## 2015-01-21 DIAGNOSIS — Z170 Estrogen receptor positive status [ER+]: Secondary | ICD-10-CM | POA: Diagnosis not present

## 2015-01-21 DIAGNOSIS — C50812 Malignant neoplasm of overlapping sites of left female breast: Secondary | ICD-10-CM

## 2015-01-21 DIAGNOSIS — C50212 Malignant neoplasm of upper-inner quadrant of left female breast: Secondary | ICD-10-CM | POA: Diagnosis not present

## 2015-01-21 DIAGNOSIS — E119 Type 2 diabetes mellitus without complications: Secondary | ICD-10-CM

## 2015-01-21 DIAGNOSIS — R0602 Shortness of breath: Secondary | ICD-10-CM

## 2015-01-21 LAB — CBC WITH DIFFERENTIAL/PLATELET
BASO%: 0.4 % (ref 0.0–2.0)
Basophils Absolute: 0 10*3/uL (ref 0.0–0.1)
EOS ABS: 0 10*3/uL (ref 0.0–0.5)
EOS%: 0.4 % (ref 0.0–7.0)
HEMATOCRIT: 35 % (ref 34.8–46.6)
HEMOGLOBIN: 11.7 g/dL (ref 11.6–15.9)
LYMPH%: 42.9 % (ref 14.0–49.7)
MCH: 34.8 pg — ABNORMAL HIGH (ref 25.1–34.0)
MCHC: 33.4 g/dL (ref 31.5–36.0)
MCV: 104.2 fL — ABNORMAL HIGH (ref 79.5–101.0)
MONO#: 0.9 10*3/uL (ref 0.1–0.9)
MONO%: 11.2 % (ref 0.0–14.0)
NEUT%: 45.1 % (ref 38.4–76.8)
NEUTROS ABS: 3.8 10*3/uL (ref 1.5–6.5)
NRBC: 2 % — AB (ref 0–0)
PLATELETS: 145 10*3/uL (ref 145–400)
RBC: 3.36 10*6/uL — AB (ref 3.70–5.45)
RDW: 16.6 % — ABNORMAL HIGH (ref 11.2–14.5)
WBC: 8.3 10*3/uL (ref 3.9–10.3)
lymph#: 3.6 10*3/uL — ABNORMAL HIGH (ref 0.9–3.3)

## 2015-01-21 LAB — COMPREHENSIVE METABOLIC PANEL (CC13)
ALT: 32 U/L (ref 0–55)
ANION GAP: 10 meq/L (ref 3–11)
AST: 70 U/L — ABNORMAL HIGH (ref 5–34)
Albumin: 3.6 g/dL (ref 3.5–5.0)
Alkaline Phosphatase: 271 U/L — ABNORMAL HIGH (ref 40–150)
BUN: 12.8 mg/dL (ref 7.0–26.0)
CHLORIDE: 108 meq/L (ref 98–109)
CO2: 22 meq/L (ref 22–29)
CREATININE: 0.8 mg/dL (ref 0.6–1.1)
Calcium: 10.2 mg/dL (ref 8.4–10.4)
EGFR: 70 mL/min/{1.73_m2} — ABNORMAL LOW (ref >90)
Glucose: 164 mg/dl — ABNORMAL HIGH (ref 70–140)
Potassium: 3.8 mEq/L (ref 3.5–5.1)
Sodium: 140 mEq/L (ref 136–145)
Total Bilirubin: 0.5 mg/dL (ref 0.20–1.20)
Total Protein: 7.2 g/dL (ref 6.4–8.3)

## 2015-01-21 MED ORDER — ONDANSETRON HCL 4 MG PO TABS: 4.0000 mg | ORAL_TABLET | Freq: Four times a day (QID) | ORAL | Status: DC

## 2015-01-21 MED ORDER — VENLAFAXINE HCL ER 37.5 MG PO CP24: 37.5000 mg | ORAL_CAPSULE | Freq: Every day | ORAL | Status: DC

## 2015-01-21 NOTE — Progress Notes (Signed)
ID: Jamie [name]   DOB: [birth year]  MR#: [id number]  JWJ#:[id number]  PCP: Jamie Reel, MD GYN: Jamie Farrier, MD SU: OTHER MD: Jamie Shin, MD;  Jamie Edis, MD, Jamie [name] DDS, Jamie [name] [name].  CHIEF COMPLAINT:  Metastatic Breast Cancer CURRENT THERAPY: Letrozole, zolendronate   BREAST CANCER HISTORY: From the prior intake note:  The patient's cancer was uncovered by an unusually circuitous route. She presented with stomach upset.  Because of a history of colitis, Dr. Teena [name] who happened to be on call for Dr. Cristina [name], obtained CT of the abdomen and pelvis on August 30, 2005.  As far as the abdomen and pelvis were concerned, there was no evidence of colitis but there were multiple scattered sclerotic lesions in the bone suggesting possible sclerotic metastatic disease (versus osteopoikilosis.)  Accordingly a bone scan was obtained.  This was done on September 01, 2005, and found tiny sclerotic lesions, and in particular a lesion of concern in the sternum.  Accordingly a CT scan of the chest was obtained.  This found the sternal problem to correspond with degenerative changes. However, it also showed a soft tissue nodule in the left breast.  The patient then had mammograms on September 27, 2005.  This found two areas of spiculation and architectural distortion in the upper outer quadrant of the left breast.  By ultrasound these were hyperechoic and irregular, highly suspicious for carcinoma.  Biopsy was obtained the same day and showed [id number] [phone] [id number], as well as [phone]) invasive lobular carcinoma, with both lesions biopsied being ER and PR positive, both herceptest positive, both negative by FISH.  Accordingly the patient was referred to Dr. Georgette [name] and breast MRI was obtained October 06, 2006.  No abnormal enhancement was noted in the right breast.  There was enhancing nodularity in the upper portion of the left breast where several discrete nodules were seen.  Accordingly Dr.  Georgette [name] performed a left modified radical mastectomy on November 02, 2005.  The patient had a positive sentinel lymph node and Dr. Georgette [name] performed a left axillary lymph node dissection.  However, only three additional lymph nodes were recovered, two of which also showed metastatic involvement (all had extra capsular extension.)    The patient's subsequent history is as detailed below  INTERVAL HISTORY: "Jamie [name]" returns today for followup of her metastatic breast carcinoma. She is feeling "terrible". She is having pain in her neck all the time and it doesn't let her sleep. Is more left than right side. She has pain in her mid back as well. Her left knee hurts, although that has only been happening for the last 3 days. She's been staying in bed more, although less than 50% of the day. She was very tearful because "I can't think". When she tries to talk to people sometimes she can't find the right word. This is very frustrating. The situation at home continues to be "terrible". She has no solutions for that. She says her blood sugar is doing well. She complains of chills, sinus problems, palpitations, worsening shortness of breath even with walking on a flat surface, dry cough, heartburn, poor appetite, and anxiety attacks.   REVIEW OF SYSTEMS: A detailed review of systems today was stable except as noted above  PAST MEDICAL HISTORY: Past Medical History  Diagnosis Date  . Anxiety   . Asthma   . Breast cancer     metastasized  . Depression   . Diabetes mellitus   . GERD (gastroesophageal reflux disease)   .  Hyperlipidemia   . Internal hemorrhoids   . IBS (irritable bowel syndrome)   . Mitral valve prolapse   . Ischemic colitis   . Fibromyalgia   . Allergy   . Cataract   . S/P radiation therapy within four to twelve weeks 04/26/13-05/11/13    L-spine/sacrum 30Gy/65fx  . Colon polyp Jan 22, 2012    TUBULAR ADENOMA  Significant for diabetes, hypercholesterolemia, asthma, mitral valve prolapse requiring  antibiotic prophylaxis before any invasive procedures, Meniere's disease, gastroesophageal reflux disease, osteoarthritis, degenerative disk disease, history of fibromyalgia, panic attacks, history of synovial cyst removal, history of total hysterectomy with bilateral salpingo-oophorectomy, history of septoplasty x2, status post appendectomy, status post dilatation and curettage remotely, status post bilateral carpal tunnel release under Jamie [name].  PAST SURGICAL HISTORY: Past Surgical History  Procedure Laterality Date  . Mastectomy Left     then treated with chemo and radiation  . Total abdominal hysterectomy    . Back surgery      synovial cyst removed from spine  . Carpal tunnel release Bilateral   . Dilation and curettage of uterus    . Appendectomy    . Cataract extraction Bilateral   . Colonoscopy w/ biopsies  Jan 22, 2012  . Esophagogastroduodenoscopy  01-22-2007    normal    FAMILY HISTORY The patient's father died at the age of 82 from a stroke.  The patient's mother died at the age of 8, also from a stroke.  The patient has three brothers; one died with "liver disease."  One has prostate cancer.  The other brother has prostate and colon cancer.  The only breast cancer in the family was the patient's mother's mother and she does not know how old her grandmother was when she was diagnosed.  There is no history of ovarian cancer in the family.  GYNECOLOGIC HISTORY: She is GXP2.  She had her hysterectomy while still menstruating and she took hormone replacement until December 2006, when she had her breast biopsy.  SOCIAL HISTORY: (Updated 02/01/2014) She used to work as a [employer]arry manager.  Her husband Jamie [name] (who is the son of my former patient Jamie [name]) is semiretired, occasionally working for a Fore[employer] scientist.  What he likes to do is hunt and fish.  Their son Jamie [name] had significant muscular dystrophy and died in 01/22/07. Their daughter Jamie [name] lives in [place] and works in an office.  The  patient has two granddaughters, both RNs, one working at [employer] and the other in [place].    ADVANCED DIRECTIVES: Not in place  HEALTH MAINTENANCE:  (Updated 02/01/2014) History  Substance Use Topics  . Smoking status: Never Smoker   . Smokeless tobacco: Never Used  . Alcohol Use: No     Colonoscopy:  July 2013, Dr. Olevia Perches  PAP: s/p hysterectomy  Bone density:  November 2014 at [facility], normal  Lipid panel: Dr. Virgina Jock    Allergies  Allergen Reactions  . Hydrocodone Anxiety    Pt. Said she is fine with this medication  . Metformin And Related     GI sxs  . Morphine And Related   . Niacin Other (See Comments)    Dizziness   . Promethazine Hcl Other (See Comments)    Dizziness "FELT CRAZY"     Current Outpatient Prescriptions  Medication Sig Dispense Refill  . ACCU-CHEK SMARTVIEW test strip   11  . acetaminophen (TYLENOL) 500 MG tablet Take 500-1,000 mg by mouth every 6 (six) hours as needed for mild pain  or moderate pain.     . ALREX 0.2 % SUSP Place 1 drop into both eyes daily.   0  . BYSTOLIC 5 MG tablet Take 1 tablet by mouth Daily.    . Calcium Carbonate (CALCIUM 600 PO) Take 1 tablet by mouth daily.    . Cholecalciferol (VITAMIN D) 1000 UNITS capsule Take 1,000 Units by mouth 2 (two) times daily.     . clonazePAM (KLONOPIN) 0.5 MG tablet Take 0.5 mg by mouth 2 (two) times daily as needed for anxiety.     . CRESTOR 5 MG tablet Take 1 tablet by mouth Daily.    Marland Kitchen esomeprazole (NEXIUM) 40 MG capsule Take 1 capsule (40 mg total) by mouth daily. 90 capsule 3  . glimepiride (AMARYL) 1 MG tablet Take 1 mg by mouth daily.     Marland Kitchen GLYCERIN ADULT 2 G suppository Place 1 suppository rectally daily as needed for mild constipation.     . hydrochlorothiazide (HYDRODIURIL) 25 MG tablet Take 12.5 mg by mouth every Monday, Wednesday, and Friday.     . hydrocortisone (ANUSOL-HC) 2.5 % rectal cream Place 1 application rectally daily as needed for hemorrhoids or  itching.     . letrozole (FEMARA) 2.5 MG tablet TAKE 1 TABLET (2.5 MG TOTAL) BY MOUTH DAILY. 90 tablet 4  . lidocaine-hydrocortisone (ANAMANTEL HC) 3-0.5 % CREA Place 1 Applicatorful rectally 2 (two) times daily as needed for hemorrhoids and bleeding. 28.35 g 0  . meclizine (ANTIVERT) 25 MG tablet Take 25 mg by mouth as needed for dizziness.     . metoCLOPramide (REGLAN) 5 MG tablet Take 1 tablet (5 mg total) by mouth every 8 (eight) hours as needed for nausea. 30 tablet 1  . ondansetron (ZOFRAN) 4 MG tablet Take 1 tablet (4 mg total) by mouth every 6 (six) hours. 12 tablet 0  . ONGLYZA 5 MG TABS tablet Take 5 mg by mouth daily.     Marland Kitchen oxyCODONE-acetaminophen (PERCOCET/ROXICET) 5-325 MG per tablet Take 1 tablet by mouth every 6 (six) hours as needed for severe pain. 60 tablet 0  . Polyethyl Glycol-Propyl Glycol (SYSTANE OP) Apply 1-2 drops to eye daily as needed (dry eyes).     Marland Kitchen PROAIR HFA 108 (90 BASE) MCG/ACT inhaler Inhale 1-2 puffs into the lungs every 6 (six) hours as needed for wheezing or shortness of breath.     Marland Kitchen QVAR 80 MCG/ACT inhaler Inhale 2 puffs into the lungs 2 (two) times daily.     . sucralfate (CARAFATE) 1 G tablet Take 1 g by mouth 4 (four) times daily.    Marland Kitchen triamterene-hydrochlorothiazide (MAXZIDE) 75-50 MG per tablet Take 0.5 tablets by mouth 3 (three) times a week.   2   No current facility-administered medications for this visit.    OBJECTIVE: Middle-aged white woman who was tearful during today's visit Filed Vitals:   01/21/15 0808  BP: 144/53  Pulse: 79  Temp: 97.9 F (36.6 C)  Resp: 18     Body mass index is 24.43 kg/(m^2).    ECOG FS: 2 Filed Weights   01/21/15 0808  Weight: 142 lb 6.4 oz (64.592 kg)    Sclerae unicteric, pupils round and equal Oropharynx clear and moist No cervical or supraclavicular adenopathy Lungs no rales or rhonchi, fair excursion bilaterally Heart regular rate and rhythm, no murmur appreciated Abd soft, nontender, positive bowel  sounds, no masses palpated MSK no focal spinal tenderness to vigorous palpation and percussion particularly of the thoracic spine  Neuro:  Nonfocal, well-oriented, depressedffect Breasts: The right breast is unremarkable. The left breast is status post mastectomy and radiation. There are some telangiectasiasPreviously notedThere is no evidence of chest wall recurrence. The left axilla is benign.   LAB RESULTS: Lab Results  Component Value Date   WBC 8.1 11/21/2014   NEUTROABS 3.7 11/21/2014   HGB 11.9 11/21/2014   HCT 35.0 11/21/2014   MCV 100.0 11/21/2014   PLT 138* 11/21/2014      Chemistry      Component Value Date/Time   NA 141 11/21/2014 1231   NA 140 11/15/2014 2131   K 4.0 11/21/2014 1231   K 3.5 11/15/2014 2131   CL 104 11/15/2014 2131   CL 103 03/22/2013 0754   CO2 23 11/21/2014 1231   CO2 26 11/15/2014 2131   BUN 10.5 11/21/2014 1231   BUN 13 11/15/2014 2131   CREATININE 0.8 11/21/2014 1231   CREATININE 0.65 11/15/2014 2131      Component Value Date/Time   CALCIUM 9.5 11/21/2014 1231   CALCIUM 9.6 11/15/2014 2131   ALKPHOS 133 11/21/2014 1231   ALKPHOS 129* 11/15/2014 2338   AST 77* 11/21/2014 1231   AST 72* 11/15/2014 2338   ALT 57* 11/21/2014 1231   ALT 39* 11/15/2014 2338   BILITOT 0.34 11/21/2014 1231   BILITOT 0.6 11/15/2014 2338     Results for Tall, Markeshia A (MRN [id number]) as of 01/21/2015 08:12  Ref. Range 08/09/2012 10:07 03/28/2014 14:01 05/23/2014 11:32 08/15/2014 10:39 11/21/2014 12:31  CA 27.29 Latest Range: 0-39 U/mL 57 (H) 94 (H) 115 (H) 208 (H) 436 (H)    STUDIES: CLINICAL DATA: Nonproductive cough for the past several months, nonsmoker, history of BIRT breast malignancy with metastatic disease to the bone  EXAM: CHEST 2 VIEW  COMPARISON: Chest x-ray of August 31, 2014  FINDINGS: The lungs are well-expanded. There is no focal infiltrate. No pulmonary parenchymal masses are demonstrated. The heart and pulmonary vascularity  are normal. The mediastinum is normal in width. There is no pleural effusion or pneumothorax. Multiple sclerotic metastatic foci are noted within the vertebral bodies. There is no compression fracture.  IMPRESSION: There is no radiographic evidence of active cardiopulmonary disease. However, given the patient's persistent cough and known metastatic breast malignancy to bone, chest CT scanning is recommended.   Electronically Signed  By: David Swaziland  On: 12/05/2014 11:14 CLINICAL DATA: Elevated LFTs. Severe nausea. Symptoms with eating. Epigastric and left lower quadrant pain for 2 months. Metastatic breast cancer.  EXAM: ULTRASOUND ABDOMEN COMPLETE  COMPARISON: CT of the abdomen and pelvis on 11/16/2014  FINDINGS: Gallbladder: Gallbladder has a normal appearance. Gallbladder wall is 1.9 mm, within normal limits. No stones or pericholecystic fluid. No sonographic Murphy's sign.  Common bile duct: Diameter: 3.3 mm  Liver: No focal lesion identified. Within normal limits in parenchymal echogenicity.  IVC: No abnormality visualized.  Pancreas: Visualized portion unremarkable.  Spleen: Size and appearance within normal limits.  Right Kidney: Length: 10.8 cm. Echogenicity within normal limits. No mass visualized. Extrarenal pelvis noted.  Left Kidney: Length: 9.9 cm. Echogenicity within normal limits. No mass or hydronephrosis visualized.  Abdominal aorta: No aneurysm visualized.  Other findings: None.  IMPRESSION: 1. No evidence for acute cholecystitis. 2. No evidence for metastatic lesions of the abdomen.   Electronically Signed  By: Norva Pavlov [name].  On: 12/05/2014 09:43 ASSESSMENT:78 y.o.  Mardene Sayer woman    (1) status post left modified radical mastectomy in January 2007 for a T1c N1, stage IIA  invasive lobular carcinoma, grade 1, strongly estrogen and progesterone receptor positive, HER-2 negative, with an MIB-1 of  7%.  (2)  Status post radiation given concurrently with Capecitabine.  Declined chemotherapy.    (3) Status post anastrozole and exemestane, both discontinued due to aches and pains.  Status post tamoxifen between October 2007 until August 2011, discontinued secondary to cramps   (4) multiple sclerotic bony lesions noted at initial diagnosis, biopsied x2 August 2009 without malignancy documented, on zoledronic acid annually starting 12/12/2007. Increased to monthly starting 07/17/2013  (5) On fulvestrant between September 2011 and 05/17/2013 with likely progression of bony disease  (6)  radiation to the lumbosacral spine and right hip to treat right hip pain to 30 gray completed 09/73/5329, complicated by [possible] radiation induced colitis  (7)  on letrozole since September 2014.  (8) received zoledronic acid monthly basis beginning September 2014, after May 2015 switched to denosumab, switched back to zolendronate 05/23/2014 to be given every 12 weeks (most recent dose to 02/05/2015)  PLAN:  Jamie [name] is feeling worse. She has more pain, especially in her neck. She feels short of breath. She did have a chest x-ray which was unremarkable in February. She does not have cough, phlegm production, or pleurisy. She does not have a "catch" when taking a deep breath. Given the fact that her CA-27-29 keeps rising, I think we have to evaluate these aggressively so I am setting her up for a CT and GU of the chest and an MRI of the neck.  If those are unremarkable, she is also scheduled for a bone scan 02/19/2015.  I think if she could tolerate antidepressants she would have less pain and possibly be able to cope with her difficult home situation better. I am starting her on venlafaxine at 37.5 mg daily. She will let me know she has any side effects from that.  And has a good understanding of the overall plan. She agrees with it. She knows the goal of treatment in her case is control. She will call with any  problems that may develop before her next visit here.  Chauncey Cruel, MD     01/21/2015

## 2015-01-21 NOTE — Telephone Encounter (Signed)
Appointment made for lab and avs printed for patient

## 2015-01-22 LAB — CANCER ANTIGEN 27.29: CA 27.29: 780 U/mL — ABNORMAL HIGH (ref 0–39)

## 2015-01-23 ENCOUNTER — Encounter: Payer: Self-pay | Admitting: *Deleted

## 2015-01-23 ENCOUNTER — Telehealth: Payer: Self-pay | Admitting: *Deleted
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
## 2015-01-24 ENCOUNTER — Telehealth: Payer: Self-pay | Admitting: *Deleted

## 2015-01-24 NOTE — Telephone Encounter (Signed)
PT. ASKED THAT THIS BE DONE AS SOON AS POSSIBLE. THIS NOTE ROUTED TO [place] DODD,RN.

## 2015-01-27 NOTE — Telephone Encounter (Signed)
This RN sent prior request to scheduling per pt's desire to change locations for scans.

## 2015-01-28 ENCOUNTER — Ambulatory Visit
Admission: RE | Admit: 2015-01-28 | Discharge: 2015-01-28 | Disposition: A | Discharge status: Home / Self Care | Payer: Commercial Managed Care - HMO | Source: Ambulatory Visit | Attending: Oncology | Admitting: Oncology

## 2015-01-28 DIAGNOSIS — C50919 Malignant neoplasm of unspecified site of unspecified female breast: Secondary | ICD-10-CM

## 2015-01-28 MED ORDER — IOHEXOL 350 MG/ML SOLN
100.0000 mL | Freq: Once | INTRAVENOUS | Status: AC | PRN
  Administered 2015-01-28: 100 mL via INTRAVENOUS

## 2015-01-29 ENCOUNTER — Other Ambulatory Visit: Payer: Self-pay | Admitting: Oncology

## 2015-01-30 ENCOUNTER — Other Ambulatory Visit: Payer: Commercial Managed Care - HMO

## 2015-01-30 ENCOUNTER — Ambulatory Visit: Payer: Commercial Managed Care - HMO

## 2015-02-06 ENCOUNTER — Encounter (HOSPITAL_COMMUNITY): Payer: Self-pay | Admitting: Emergency Medicine

## 2015-02-06 ENCOUNTER — Observation Stay (HOSPITAL_COMMUNITY)
Admission: EM | Admit: 2015-02-06 | Discharge: 2015-02-09 | Disposition: A | Discharge status: Home / Self Care | Payer: Commercial Managed Care - HMO | Attending: Internal Medicine | Admitting: Internal Medicine

## 2015-02-06 DIAGNOSIS — T7491XA Unspecified adult maltreatment, confirmed, initial encounter: Secondary | ICD-10-CM | POA: Diagnosis not present

## 2015-02-06 DIAGNOSIS — Z853 Personal history of malignant neoplasm of breast: Secondary | ICD-10-CM | POA: Insufficient documentation

## 2015-02-06 DIAGNOSIS — R0602 Shortness of breath: Secondary | ICD-10-CM

## 2015-02-06 DIAGNOSIS — Z923 Personal history of irradiation: Secondary | ICD-10-CM | POA: Diagnosis not present

## 2015-02-06 DIAGNOSIS — K589 Irritable bowel syndrome without diarrhea: Secondary | ICD-10-CM | POA: Diagnosis not present

## 2015-02-06 DIAGNOSIS — Z7952 Long term (current) use of systemic steroids: Secondary | ICD-10-CM | POA: Diagnosis not present

## 2015-02-06 DIAGNOSIS — R531 Weakness: Secondary | ICD-10-CM | POA: Diagnosis not present

## 2015-02-06 DIAGNOSIS — K219 Gastro-esophageal reflux disease without esophagitis: Secondary | ICD-10-CM | POA: Diagnosis not present

## 2015-02-06 DIAGNOSIS — R0609 Other forms of dyspnea: Secondary | ICD-10-CM | POA: Diagnosis present

## 2015-02-06 DIAGNOSIS — C7951 Secondary malignant neoplasm of bone: Secondary | ICD-10-CM | POA: Insufficient documentation

## 2015-02-06 DIAGNOSIS — Z79891 Long term (current) use of opiate analgesic: Secondary | ICD-10-CM | POA: Diagnosis not present

## 2015-02-06 DIAGNOSIS — R197 Diarrhea, unspecified: Secondary | ICD-10-CM | POA: Diagnosis present

## 2015-02-06 DIAGNOSIS — K648 Other hemorrhoids: Secondary | ICD-10-CM | POA: Diagnosis not present

## 2015-02-06 DIAGNOSIS — E785 Hyperlipidemia, unspecified: Secondary | ICD-10-CM | POA: Diagnosis not present

## 2015-02-06 DIAGNOSIS — C50919 Malignant neoplasm of unspecified site of unspecified female breast: Secondary | ICD-10-CM | POA: Diagnosis present

## 2015-02-06 DIAGNOSIS — I341 Nonrheumatic mitral (valve) prolapse: Secondary | ICD-10-CM | POA: Diagnosis not present

## 2015-02-06 DIAGNOSIS — R112 Nausea with vomiting, unspecified: Secondary | ICD-10-CM | POA: Insufficient documentation

## 2015-02-06 DIAGNOSIS — J45909 Unspecified asthma, uncomplicated: Secondary | ICD-10-CM | POA: Diagnosis not present

## 2015-02-06 DIAGNOSIS — I10 Essential (primary) hypertension: Secondary | ICD-10-CM | POA: Diagnosis present

## 2015-02-06 DIAGNOSIS — F419 Anxiety disorder, unspecified: Secondary | ICD-10-CM | POA: Diagnosis not present

## 2015-02-06 DIAGNOSIS — E119 Type 2 diabetes mellitus without complications: Secondary | ICD-10-CM

## 2015-02-06 DIAGNOSIS — R7989 Other specified abnormal findings of blood chemistry: Secondary | ICD-10-CM | POA: Diagnosis present

## 2015-02-06 DIAGNOSIS — Z9012 Acquired absence of left breast and nipple: Secondary | ICD-10-CM | POA: Diagnosis not present

## 2015-02-06 DIAGNOSIS — F329 Major depressive disorder, single episode, unspecified: Secondary | ICD-10-CM | POA: Diagnosis not present

## 2015-02-06 DIAGNOSIS — R946 Abnormal results of thyroid function studies: Secondary | ICD-10-CM | POA: Diagnosis not present

## 2015-02-06 DIAGNOSIS — F32A Depression, unspecified: Secondary | ICD-10-CM | POA: Diagnosis present

## 2015-02-06 DIAGNOSIS — R0902 Hypoxemia: Secondary | ICD-10-CM

## 2015-02-06 DIAGNOSIS — J9601 Acute respiratory failure with hypoxia: Secondary | ICD-10-CM | POA: Diagnosis not present

## 2015-02-06 DIAGNOSIS — R1084 Generalized abdominal pain: Secondary | ICD-10-CM | POA: Insufficient documentation

## 2015-02-06 LAB — COMPREHENSIVE METABOLIC PANEL
ALT: 36 U/L — AB (ref 0–35)
AST: 80 U/L — AB (ref 0–37)
Albumin: 4 g/dL (ref 3.5–5.2)
Alkaline Phosphatase: 162 U/L — ABNORMAL HIGH (ref 39–117)
Anion gap: 9 (ref 5–15)
BUN: 13 mg/dL (ref 6–23)
CO2: 24 mmol/L (ref 19–32)
Calcium: 9.8 mg/dL (ref 8.4–10.5)
Chloride: 107 mmol/L (ref 96–112)
Creatinine, Ser: 0.75 mg/dL (ref 0.50–1.10)
GFR calc Af Amer: >90 mL/min (ref >90)
GFR calc non Af Amer: 80 mL/min — ABNORMAL LOW (ref >90)
Glucose, Bld: 190 mg/dL — ABNORMAL HIGH (ref 70–99)
Potassium: 3.7 mmol/L (ref 3.5–5.1)
SODIUM: 140 mmol/L (ref 135–145)
TOTAL PROTEIN: 7.1 g/dL (ref 6.0–8.3)
Total Bilirubin: 0.9 mg/dL (ref 0.3–1.2)

## 2015-02-06 LAB — LIPASE, BLOOD: Lipase: 40 U/L (ref 11–59)

## 2015-02-06 LAB — I-STAT CG4 LACTIC ACID, ED: LACTIC ACID, VENOUS: 1.14 mmol/L (ref 0.5–2.0)

## 2015-02-06 MED ORDER — ONDANSETRON HCL 4 MG/2ML IJ SOLN
4.0000 mg | Freq: Once | INTRAMUSCULAR | Status: AC
  Administered 2015-02-06: 4 mg via INTRAVENOUS
  Filled 2015-02-06: qty 2

## 2015-02-06 NOTE — ED Provider Notes (Signed)
CSN: [id number]     Arrival date & time 02/06/15  2124 History   First MD Initiated Contact with Patient 02/06/15 2207     Chief Complaint  Patient presents with  . Emesis    diarrhea     (Consider location/radiation/quality/duration/timing/severity/associated sxs/prior Treatment) HPI Jamie [name] is a 79 year old female with past medical history of metastatic breast cancer, diabetes, GERD, IBS, Mnire's disease who presents the ER complaining of several complaints. Patient reports 3 days of nausea, vomiting, diarrhea, dizziness. Patient states this episode began on Monday, and she took some of her prescribed meclizine for it. Patient reports mild relief with meclizine. Patient states her symptoms improved to 2 morning, and over the past 2 days should not have any symptoms. Patient states this morning she began having dizziness, nausea, vomiting, diarrhea again. Patient states these symptoms are consistent with and identical to episodes she has had in the past which her PCP has told her are related to her Mnire's disease. She states there is nothing different about this current episode, however her husband encouraged her to come to the ER for further evaluation. Patient also complains of several weeks of dyspnea on exertion. Patient reports walking to the mailbox at her house gets her breath. Patient reports she becomes dyspneic with almost everywhere she walks. Patient states at rest her symptoms resolved completely. Patient reports lower associated generalized abdominal pain, which she states is ongoing with these episodes of nausea vomiting and diarrhea. Patient denies numbness, weakness, blurred vision, headache, chest pain, syncope, fever, dysuria.  Past Medical History  Diagnosis Date  . Anxiety   . Asthma   . Breast cancer     metastasized  . Depression   . Diabetes mellitus   . GERD (gastroesophageal reflux disease)   . Hyperlipidemia   . Internal hemorrhoids   . IBS (irritable  bowel syndrome)   . Mitral valve prolapse   . Ischemic colitis   . Fibromyalgia   . Allergy   . Cataract   . S/P radiation therapy within four to twelve weeks 04/26/13-05/11/13    L-spine/sacrum 30Gy/78fx  . Colon polyp 2013    TUBULAR ADENOMA   Past Surgical History  Procedure Laterality Date  . Mastectomy Left     then treated with chemo and radiation  . Total abdominal hysterectomy    . Back surgery      synovial cyst removed from spine  . Carpal tunnel release Bilateral   . Dilation and curettage of uterus    . Appendectomy    . Cataract extraction Bilateral   . Colonoscopy w/ biopsies  2013  . Esophagogastroduodenoscopy  2008    normal   Family History  Problem Relation Age of Onset  . Prostate cancer Brother     x2  . Irritable bowel syndrome Mother   . Colon cancer Neg Hx   . Stroke Father    History  Substance Use Topics  . Smoking status: Never Smoker   . Smokeless tobacco: Never Used  . Alcohol Use: No   OB History    No data available     Review of Systems  Constitutional: Negative for fever.  HENT: Negative for trouble swallowing.   Eyes: Negative for visual disturbance.  Respiratory: Negative for shortness of breath.   Cardiovascular: Negative for chest pain.  Gastrointestinal: Positive for nausea, vomiting and diarrhea. Negative for abdominal pain.  Genitourinary: Negative for dysuria.  Musculoskeletal: Negative for neck pain.  Skin: Negative for rash.  Neurological:  Positive for dizziness. Negative for weakness and numbness.  Psychiatric/Behavioral: Negative.       Allergies  Hydrocodone; Metformin and related; Morphine and related; Niacin; and Promethazine hcl  Home Medications   Prior to Admission medications   Medication Sig Start Date End Date Taking? Authorizing Provider  ACCU-CHEK SMARTVIEW test strip  08/25/14  Yes Historical Provider, MD  acetaminophen (TYLENOL) 500 MG tablet Take 500 mg by mouth every 6 (six) hours as needed for  mild pain or moderate pain (pain).    Yes Historical Provider, MD  BYSTOLIC 5 MG tablet Take 1 tablet by mouth Daily. 02/16/11  Yes Historical Provider, MD  Calcium Carbonate (CALCIUM 600 PO) Take 1 tablet by mouth daily.   Yes Historical Provider, MD  Cholecalciferol (VITAMIN D) 1000 UNITS capsule Take 1,000 Units by mouth 2 (two) times daily.    Yes Historical Provider, MD  clonazePAM (KLONOPIN) 0.5 MG tablet Take 0.5 mg by mouth 2 (two) times daily.    Yes Historical Provider, MD  CRESTOR 5 MG tablet Take 1 tablet by mouth Daily. 02/05/11  Yes Historical Provider, MD  esomeprazole (NEXIUM) 40 MG capsule Take 1 capsule (40 mg total) by mouth daily. 09/25/14  Yes Jessica D Zehr, PA-C  glimepiride (AMARYL) 1 MG tablet Take 1 mg by mouth daily.    Yes Historical Provider, MD  GLYCERIN ADULT 2 G suppository Place 1 suppository rectally daily as needed for mild constipation (constipation).  01/21/14  Yes Historical Provider, MD  hydrochlorothiazide (HYDRODIURIL) 25 MG tablet Take 12.5 mg by mouth every Monday, Wednesday, and Friday.    Yes Historical Provider, MD  letrozole (FEMARA) 2.5 MG tablet TAKE 1 TABLET (2.5 MG TOTAL) BY MOUTH DAILY. 09/06/14  Yes Chauncey Cruel, MD  lidocaine-hydrocortisone [facility]) 3-0.5 % CREA Place 1 Applicatorful rectally 2 (two) times daily as needed for hemorrhoids and bleeding. 05/17/13  Yes Shon Baton, MD  meclizine (ANTIVERT) 25 MG tablet Take 25 mg by mouth 2 (two) times daily as needed for dizziness (dizziness).  12/13/10  Yes Historical Provider, MD  ONGLYZA 5 MG TABS tablet Take 5 mg by mouth 2 (two) times daily.  02/24/12  Yes Historical Provider, MD  Polyethyl Glycol-Propyl Glycol (SYSTANE OP) Apply 1-2 drops to eye daily as needed (dry eyes).    Yes Historical Provider, MD  PROAIR HFA 108 (90 BASE) MCG/ACT inhaler Inhale 1-2 puffs into the lungs every 6 (six) hours as needed for wheezing or shortness of breath (wheezing).  05/30/12  Yes Historical Provider, MD   QVAR 80 MCG/ACT inhaler Inhale 2 puffs into the lungs 2 (two) times daily.  09/22/14  Yes Historical Provider, MD  triamterene-hydrochlorothiazide (MAXZIDE) 75-50 MG per tablet Take 0.5 tablets by mouth 3 (three) times a week.  09/02/14  Yes Historical Provider, MD  ALREX 0.2 % SUSP Place 1 drop into both eyes daily.  08/20/14   Historical Provider, MD  fluconazole (DIFLUCAN) 150 MG tablet Take 150 mg by mouth daily. Take 1 Tablet Every 10 Days. 02/04/15   Historical Provider, MD  hydrocortisone (ANUSOL-HC) 2.5 % rectal cream Place 1 application rectally daily as needed for hemorrhoids or itching.  05/18/13   Shon Baton, MD  ondansetron (ZOFRAN) 4 MG tablet Take 1 tablet (4 mg total) by mouth every 6 (six) hours. 01/21/15   Chauncey Cruel, MD  oxyCODONE-acetaminophen (PERCOCET/ROXICET) 5-325 MG per tablet Take 1 tablet by mouth every 6 (six) hours as needed for severe pain. Patient not taking: Reported on  02/06/2015 10/23/14   Chauncey Cruel, MD  sucralfate (CARAFATE) 1 G tablet Take 1 g by mouth 4 (four) times daily as needed.     Historical Provider, MD  venlafaxine XR (EFFEXOR-XR) 37.5 MG 24 hr capsule Take 1 capsule (37.5 mg total) by mouth daily with breakfast. 01/21/15   Chauncey Cruel, MD   BP 140/54 mmHg  Pulse 88  Temp(Src) 97.6 F (36.4 C) (Oral)  Resp 12  SpO2 92% Physical Exam  Constitutional: She is oriented to person, place, and time. She appears well-developed and well-nourished. No distress.  HENT:  Head: Normocephalic and atraumatic.  Mouth/Throat: Oropharynx is clear and moist. No oropharyngeal exudate.  Eyes: Right eye exhibits no discharge. Left eye exhibits no discharge. No scleral icterus.  Neck: Normal range of motion.  Cardiovascular: Normal rate, regular rhythm and normal heart sounds.   No murmur heard. Pulmonary/Chest: Effort normal and breath sounds normal. No respiratory distress.  Abdominal: Soft. There is no tenderness.  Musculoskeletal: Normal range of  motion. She exhibits no edema or tenderness.  Neurological: She is alert and oriented to person, place, and time. No cranial nerve deficit. Coordination normal.  Skin: Skin is warm and dry. No rash noted. She is not diaphoretic.  Psychiatric: She has a normal mood and affect.  Nursing note and vitals reviewed.   ED Course  Procedures (including critical care time) Labs Review Labs Reviewed  CBC WITH DIFFERENTIAL/PLATELET - Abnormal; Notable for the following:    RBC 3.26 (*)    Hemoglobin 11.5 (*)    HCT 33.8 (*)    MCV 103.7 (*)    MCH 35.3 (*)    RDW 16.0 (*)    Platelets 127 (*)    Neutrophils Relative % 41 (*)    Lymphocytes Relative 47 (*)    All other components within normal limits  COMPREHENSIVE METABOLIC PANEL - Abnormal; Notable for the following:    Glucose, Bld 190 (*)    AST 80 (*)    ALT 36 (*)    Alkaline Phosphatase 162 (*)    GFR calc non Af Amer 80 (*)    All other components within normal limits  LIPASE, BLOOD  URINALYSIS, ROUTINE W REFLEX MICROSCOPIC  BRAIN NATRIURETIC PEPTIDE  I-STAT TROPOININ, ED  I-STAT CG4 LACTIC ACID, ED    Imaging Review Dg Chest 2 View  02/07/2015   CLINICAL DATA:  Generalized weakness, history of breast cancer.  EXAM: CHEST  2 VIEW  COMPARISON:  01/28/2015 CT  FINDINGS: Cardiomediastinal contours within normal range. Left mastectomy. Left greater than right apical pleural-parenchymal thickening/scarring. Otherwise, no confluent airspace opacity, pleural effusion, pneumothorax. Multifocal areas of sclerosis, in keeping with osseous metastases.  IMPRESSION: No radiographic evidence of active cardiopulmonary disease. Left greater than right apical scarring.  Multifocal sclerotic osseous metastases.   Electronically Signed   By: Carlos Levering [name].   On: 02/07/2015 01:59     EKG Interpretation   Date/Time:  Thursday February 06 2015 23:32:45 EDT Ventricular Rate:  95 PR Interval:  209 QRS Duration: 97 QT Interval:  383 QTC  Calculation: 481 R Axis:   48 Text Interpretation:  Sinus rhythm Borderline prolonged PR interval RSR'  in V1 or V2, probably normal variant Confirmed by Glynn Octave  [phone]) on 02/07/2015 12:39:58 AM      MDM   Final diagnoses:  Generalized weakness  SOB (shortness of breath)    Patient here with several complaints. Patient's nausea vomiting or diarrhea appears  to be a chronic issue with patient, patient has a benign abdominal exam. There is no concern for acute abdomen. Labs appear at baseline for patient. With patient's history of cancer and recent shortness of breath, there is concern for PE. Patient ambulated to the bathroom in the ER and dropped her oxygen saturation into the mid 80s. Oxygen saturation rose to the mid 90s at rest. Will follow-up CT angiogram of her chest.  Patient signed out to Quincy Carnes, PA-C to f/u on results of CT and admit for exertional hypoxia.    Signed,  Dahlia Bailiff, PA-C 2:26 AM  Patient seen and discussed with Dr. Everlene Balls, MD  Dahlia Bailiff, PA-C 02/07/15 8638  Everlene Balls, MD 02/07/15 [phone]

## 2015-02-06 NOTE — ED Notes (Signed)
Bed: WA03 Expected date: 02/06/15 Expected time: 9:13 PM Means of arrival: Ambulance Comments: N,v d

## 2015-02-06 NOTE — ED Notes (Signed)
Per ems pt from home co NVD x 2 days. Also report weakness and dizziness. Per ems pt was actively vomiting prior to arrival. Pt is alert and oriented x4. BP 142/62 HR 68. CBG 161 RR 18. Hx Meniere disease.

## 2015-02-06 NOTE — ED Notes (Addendum)
Pt ambulating to restroom with steady gait. Pt requests that she ambulate to restroom prior to lab draw.

## 2015-02-07 ENCOUNTER — Encounter (HOSPITAL_COMMUNITY): Payer: Self-pay

## 2015-02-07 ENCOUNTER — Emergency Department (HOSPITAL_COMMUNITY): Payer: Commercial Managed Care - HMO

## 2015-02-07 DIAGNOSIS — I10 Essential (primary) hypertension: Secondary | ICD-10-CM

## 2015-02-07 DIAGNOSIS — E119 Type 2 diabetes mellitus without complications: Secondary | ICD-10-CM | POA: Diagnosis not present

## 2015-02-07 DIAGNOSIS — R112 Nausea with vomiting, unspecified: Secondary | ICD-10-CM | POA: Diagnosis not present

## 2015-02-07 DIAGNOSIS — R946 Abnormal results of thyroid function studies: Secondary | ICD-10-CM

## 2015-02-07 DIAGNOSIS — C7951 Secondary malignant neoplasm of bone: Secondary | ICD-10-CM

## 2015-02-07 DIAGNOSIS — R531 Weakness: Secondary | ICD-10-CM | POA: Diagnosis present

## 2015-02-07 DIAGNOSIS — C50919 Malignant neoplasm of unspecified site of unspecified female breast: Secondary | ICD-10-CM

## 2015-02-07 DIAGNOSIS — R0902 Hypoxemia: Secondary | ICD-10-CM | POA: Insufficient documentation

## 2015-02-07 DIAGNOSIS — R7989 Other specified abnormal findings of blood chemistry: Secondary | ICD-10-CM | POA: Diagnosis present

## 2015-02-07 DIAGNOSIS — R0609 Other forms of dyspnea: Secondary | ICD-10-CM

## 2015-02-07 DIAGNOSIS — R197 Diarrhea, unspecified: Secondary | ICD-10-CM

## 2015-02-07 DIAGNOSIS — R0602 Shortness of breath: Secondary | ICD-10-CM

## 2015-02-07 LAB — BASIC METABOLIC PANEL
Anion gap: 7 (ref 5–15)
BUN: 10 mg/dL (ref 6–23)
CO2: 24 mmol/L (ref 19–32)
CREATININE: 0.67 mg/dL (ref 0.50–1.10)
Calcium: 9.2 mg/dL (ref 8.4–10.5)
Chloride: 108 mmol/L (ref 96–112)
GFR, EST NON AFRICAN AMERICAN: 83 mL/min — AB (ref >90)
GLUCOSE: 150 mg/dL — AB (ref 70–99)
Potassium: 3.6 mmol/L (ref 3.5–5.1)
Sodium: 139 mmol/L (ref 135–145)

## 2015-02-07 LAB — URINALYSIS, ROUTINE W REFLEX MICROSCOPIC
Bilirubin Urine: NEGATIVE
GLUCOSE, UA: NEGATIVE mg/dL
Hgb urine dipstick: NEGATIVE
KETONES UR: NEGATIVE mg/dL
Leukocytes, UA: NEGATIVE
Nitrite: NEGATIVE
Protein, ur: NEGATIVE mg/dL
Specific Gravity, Urine: 1.01 (ref 1.005–1.030)
Urobilinogen, UA: 0.2 mg/dL (ref 0.0–1.0)
pH: 6 (ref 5.0–8.0)

## 2015-02-07 LAB — CBC WITH DIFFERENTIAL/PLATELET
Basophils Absolute: 0 10*3/uL (ref 0.0–0.1)
Basophils Absolute: 0 10*3/uL (ref 0.0–0.1)
Basophils Relative: 0 % (ref 0–1)
Basophils Relative: 0 % (ref 0–1)
EOS ABS: 0 10*3/uL (ref 0.0–0.7)
Eosinophils Absolute: 0.1 10*3/uL (ref 0.0–0.7)
Eosinophils Relative: 1 % (ref 0–5)
Eosinophils Relative: 0 % (ref 0–5)
HCT: 33.8 % — ABNORMAL LOW (ref 36.0–46.0)
HCT: 32.5 % — ABNORMAL LOW (ref 36.0–46.0)
HEMOGLOBIN: 11 g/dL — AB (ref 12.0–15.0)
Hemoglobin: 11.5 g/dL — ABNORMAL LOW (ref 12.0–15.0)
Lymphocytes Relative: 47 % — ABNORMAL HIGH (ref 12–46)
Lymphocytes Relative: 52 % — ABNORMAL HIGH (ref 12–46)
Lymphs Abs: 3.6 10*3/uL (ref 0.7–4.0)
Lymphs Abs: 5.2 10*3/uL — ABNORMAL HIGH (ref 0.7–4.0)
MCH: 35.3 pg — ABNORMAL HIGH (ref 26.0–34.0)
MCH: 35.5 pg — ABNORMAL HIGH (ref 26.0–34.0)
MCHC: 34 g/dL (ref 30.0–36.0)
MCHC: 33.8 g/dL (ref 30.0–36.0)
MCV: 103.7 fL — ABNORMAL HIGH (ref 78.0–100.0)
MCV: 104.8 fL — ABNORMAL HIGH (ref 78.0–100.0)
MONO ABS: 0.7 10*3/uL (ref 0.1–1.0)
MONOS PCT: 7 % (ref 3–12)
Monocytes Absolute: 0.9 10*3/uL (ref 0.1–1.0)
Monocytes Relative: 11 % (ref 3–12)
NEUTROS ABS: 4.2 10*3/uL (ref 1.7–7.7)
NEUTROS PCT: 41 % — AB (ref 43–77)
Neutro Abs: 3.2 10*3/uL (ref 1.7–7.7)
Neutrophils Relative %: 41 % — ABNORMAL LOW (ref 43–77)
PLATELETS: 127 10*3/uL — AB (ref 150–400)
PLATELETS: 130 10*3/uL — AB (ref 150–400)
RBC: 3.26 MIL/uL — ABNORMAL LOW (ref 3.87–5.11)
RBC: 3.1 MIL/uL — ABNORMAL LOW (ref 3.87–5.11)
RDW: 16 % — ABNORMAL HIGH (ref 11.5–15.5)
RDW: 16.3 % — ABNORMAL HIGH (ref 11.5–15.5)
WBC: 7.8 10*3/uL (ref 4.0–10.5)
WBC: 10.1 10*3/uL (ref 4.0–10.5)

## 2015-02-07 LAB — GLUCOSE, CAPILLARY
GLUCOSE-CAPILLARY: 148 mg/dL — AB (ref 70–99)
GLUCOSE-CAPILLARY: 139 mg/dL — AB (ref 70–99)
Glucose-Capillary: 133 mg/dL — ABNORMAL HIGH (ref 70–99)
Glucose-Capillary: 107 mg/dL — ABNORMAL HIGH (ref 70–99)
Glucose-Capillary: 114 mg/dL — ABNORMAL HIGH (ref 70–99)

## 2015-02-07 LAB — BRAIN NATRIURETIC PEPTIDE: B Natriuretic Peptide: 119.6 pg/mL — ABNORMAL HIGH (ref 0.0–100.0)

## 2015-02-07 LAB — TROPONIN I
Troponin I: <0.03 ng/mL (ref <0.031)
Troponin I: <0.03 ng/mL (ref <0.031)

## 2015-02-07 LAB — CK: Total CK: 74 U/L (ref 7–177)

## 2015-02-07 LAB — I-STAT TROPONIN, ED: Troponin i, poc: 0 ng/mL (ref 0.00–0.08)

## 2015-02-07 LAB — TSH: TSH: 7.367 u[IU]/mL — ABNORMAL HIGH (ref 0.350–4.500)

## 2015-02-07 LAB — LACTIC ACID, PLASMA: LACTIC ACID, VENOUS: 1.5 mmol/L (ref 0.5–2.0)

## 2015-02-07 LAB — T4, FREE: FREE T4: 1.18 ng/dL (ref 0.80–1.80)

## 2015-02-07 MED ORDER — NEBIVOLOL HCL 5 MG PO TABS
5.0000 mg | ORAL_TABLET | Freq: Every day | ORAL | Status: DC
  Administered 2015-02-07 – 2015-02-09 (×3): 5 mg via ORAL
  Filled 2015-02-07 (×3): qty 1

## 2015-02-07 MED ORDER — CLONAZEPAM 0.5 MG PO TABS
0.5000 mg | ORAL_TABLET | Freq: Two times a day (BID) | ORAL | Status: DC
  Administered 2015-02-07 – 2015-02-09 (×5): 0.5 mg via ORAL
  Filled 2015-02-07 (×5): qty 1

## 2015-02-07 MED ORDER — TRIAMTERENE-HCTZ 37.5-25 MG PO TABS
1.0000 | ORAL_TABLET | ORAL | Status: DC
  Administered 2015-02-07: 1 via ORAL
  Filled 2015-02-07 (×2): qty 1

## 2015-02-07 MED ORDER — GLIMEPIRIDE 1 MG PO TABS
1.0000 mg | ORAL_TABLET | Freq: Every day | ORAL | Status: DC
  Administered 2015-02-07 – 2015-02-09 (×3): 1 mg via ORAL
  Filled 2015-02-07 (×4): qty 1

## 2015-02-07 MED ORDER — MECLIZINE HCL 25 MG PO TABS
25.0000 mg | ORAL_TABLET | Freq: Two times a day (BID) | ORAL | Status: DC | PRN
  Filled 2015-02-07: qty 1

## 2015-02-07 MED ORDER — ALBUTEROL SULFATE (2.5 MG/3ML) 0.083% IN NEBU: 3.0000 mL | INHALATION_SOLUTION | Freq: Four times a day (QID) | RESPIRATORY_TRACT | Status: DC | PRN

## 2015-02-07 MED ORDER — PANTOPRAZOLE SODIUM 40 MG PO TBEC
40.0000 mg | DELAYED_RELEASE_TABLET | Freq: Every day | ORAL | Status: DC
  Administered 2015-02-07 – 2015-02-09 (×3): 40 mg via ORAL
  Filled 2015-02-07 (×3): qty 1

## 2015-02-07 MED ORDER — ACETAMINOPHEN 650 MG RE SUPP: 650.0000 mg | Freq: Four times a day (QID) | RECTAL | Status: DC | PRN

## 2015-02-07 MED ORDER — ACETAMINOPHEN 325 MG PO TABS: 650.0000 mg | ORAL_TABLET | Freq: Four times a day (QID) | ORAL | Status: DC | PRN

## 2015-02-07 MED ORDER — GLUCERNA SHAKE PO LIQD
237.0000 mL | Freq: Three times a day (TID) | ORAL | Status: DC
  Administered 2015-02-07 – 2015-02-09 (×3): 237 mL via ORAL
  Filled 2015-02-07 (×8): qty 237

## 2015-02-07 MED ORDER — CETYLPYRIDINIUM CHLORIDE 0.05 % MT LIQD
7.0000 mL | Freq: Two times a day (BID) | OROMUCOSAL | Status: DC
  Administered 2015-02-07 – 2015-02-09 (×5): 7 mL via OROMUCOSAL

## 2015-02-07 MED ORDER — CALCIUM CARBONATE ANTACID 500 MG PO CHEW: 2.0000 | CHEWABLE_TABLET | ORAL | Status: DC | PRN

## 2015-02-07 MED ORDER — VITAMIN D3 25 MCG (1000 UNIT) PO TABS
1000.0000 [IU] | ORAL_TABLET | Freq: Two times a day (BID) | ORAL | Status: DC
  Administered 2015-02-07 – 2015-02-09 (×5): 1000 [IU] via ORAL
  Filled 2015-02-07 (×6): qty 1

## 2015-02-07 MED ORDER — ONDANSETRON HCL 4 MG PO TABS
4.0000 mg | ORAL_TABLET | Freq: Four times a day (QID) | ORAL | Status: DC
  Administered 2015-02-07 – 2015-02-09 (×9): 4 mg via ORAL
  Filled 2015-02-07 (×11): qty 1

## 2015-02-07 MED ORDER — LINAGLIPTIN 5 MG PO TABS
5.0000 mg | ORAL_TABLET | Freq: Every day | ORAL | Status: DC
  Administered 2015-02-07 – 2015-02-09 (×3): 5 mg via ORAL
  Filled 2015-02-07 (×3): qty 1

## 2015-02-07 MED ORDER — BECLOMETHASONE DIPROPIONATE 80 MCG/ACT IN AERS: 2.0000 | INHALATION_SPRAY | Freq: Two times a day (BID) | RESPIRATORY_TRACT | Status: DC

## 2015-02-07 MED ORDER — ENOXAPARIN SODIUM 40 MG/0.4ML ~~LOC~~ SOLN
40.0000 mg | SUBCUTANEOUS | Status: DC
  Administered 2015-02-07 – 2015-02-09 (×3): 40 mg via SUBCUTANEOUS
  Filled 2015-02-07 (×3): qty 0.4

## 2015-02-07 MED ORDER — CLONAZEPAM 0.5 MG PO TABS
0.5000 mg | ORAL_TABLET | Freq: Once | ORAL | Status: AC
  Administered 2015-02-07: 0.5 mg via ORAL
  Filled 2015-02-07: qty 1

## 2015-02-07 MED ORDER — ONDANSETRON HCL 4 MG/2ML IJ SOLN: 4.0000 mg | Freq: Four times a day (QID) | INTRAMUSCULAR | Status: DC | PRN

## 2015-02-07 MED ORDER — FLUTICASONE PROPIONATE HFA 44 MCG/ACT IN AERO
2.0000 | INHALATION_SPRAY | Freq: Two times a day (BID) | RESPIRATORY_TRACT | Status: DC
  Administered 2015-02-07 – 2015-02-09 (×5): 2 via RESPIRATORY_TRACT
  Filled 2015-02-07: qty 10.6

## 2015-02-07 MED ORDER — SODIUM CHLORIDE 0.9 % IJ SOLN
3.0000 mL | Freq: Two times a day (BID) | INTRAMUSCULAR | Status: DC
  Administered 2015-02-07 – 2015-02-09 (×5): 3 mL via INTRAVENOUS

## 2015-02-07 MED ORDER — ONDANSETRON HCL 4 MG PO TABS
4.0000 mg | ORAL_TABLET | Freq: Four times a day (QID) | ORAL | Status: DC | PRN
  Filled 2015-02-07: qty 1

## 2015-02-07 MED ORDER — LOTEPREDNOL ETABONATE 0.5 % OP SUSP
1.0000 [drp] | Freq: Every day | OPHTHALMIC | Status: DC
  Administered 2015-02-07 – 2015-02-08 (×2): 1 [drp] via OPHTHALMIC
  Filled 2015-02-07: qty 5

## 2015-02-07 MED ORDER — GLYCERIN (LAXATIVE) 2 G RE SUPP
1.0000 | Freq: Every day | RECTAL | Status: DC | PRN
  Administered 2015-02-09: 1 via RECTAL
  Filled 2015-02-07 (×2): qty 1

## 2015-02-07 MED ORDER — LETROZOLE 2.5 MG PO TABS
2.5000 mg | ORAL_TABLET | Freq: Every day | ORAL | Status: DC
  Administered 2015-02-07 – 2015-02-09 (×3): 2.5 mg via ORAL
  Filled 2015-02-07 (×3): qty 1

## 2015-02-07 MED ORDER — IOHEXOL 350 MG/ML SOLN
100.0000 mL | Freq: Once | INTRAVENOUS | Status: AC | PRN
  Administered 2015-02-07: 100 mL via INTRAVENOUS

## 2015-02-07 MED ORDER — FLUCONAZOLE 150 MG PO TABS
150.0000 mg | ORAL_TABLET | Freq: Every day | ORAL | Status: DC
  Administered 2015-02-07 – 2015-02-09 (×3): 150 mg via ORAL
  Filled 2015-02-07 (×3): qty 1

## 2015-02-07 MED ORDER — INSULIN ASPART 100 UNIT/ML ~~LOC~~ SOLN
0.0000 [IU] | Freq: Three times a day (TID) | SUBCUTANEOUS | Status: DC
  Administered 2015-02-07 – 2015-02-08 (×3): 1 [IU] via SUBCUTANEOUS
  Administered 2015-02-08: 2 [IU] via SUBCUTANEOUS
  Administered 2015-02-09: 1 [IU] via SUBCUTANEOUS
  Administered 2015-02-09: 3 [IU] via SUBCUTANEOUS
  Administered 2015-02-09: 1 [IU] via SUBCUTANEOUS

## 2015-02-07 MED ORDER — HYDROCHLOROTHIAZIDE 12.5 MG PO CAPS
12.5000 mg | ORAL_CAPSULE | ORAL | Status: DC
  Administered 2015-02-07: 12.5 mg via ORAL
  Filled 2015-02-07: qty 1

## 2015-02-07 MED ORDER — ROSUVASTATIN CALCIUM 5 MG PO TABS
5.0000 mg | ORAL_TABLET | Freq: Every day | ORAL | Status: DC
  Administered 2015-02-07 – 2015-02-09 (×3): 5 mg via ORAL
  Filled 2015-02-07 (×3): qty 1

## 2015-02-07 NOTE — Progress Notes (Signed)
Progress Note   Jamie [name] LSL:[id number] DOB: 14-Nov-1935 DOA: 02/06/2015 PCP: [name], MD   Brief Narrative:   Jamie [name] is an 79 y.o. female with a PMH of metastatic breast cancer, diabetes mellitus, hypertension and asthma (not using her inhalers over the past few weeks) who was admitted 02/06/15 with nausea, vomiting, diarrhea and shortness of breath. Upon initial evaluation in the ED, the patient was noted to be hypoxic with minimal exertion. CT angiogram of the chest was negative for pulmonary embolism.  Assessment/Plan:   Principal Problem:   Exertional dyspnea and hypoxia / asthma - CT chest shows some radiation fibrosis but no pulmonary embolism or evidence for pneumonia. - Continue supplemental oxygen. May require home oxygen therapy. - Continue Qvar.  Active Problems:   Elevated TSH - Check free T4.    Essential hypertension - Continue HCTZ, Maxzide and Bystolic.    Breast cancer metastasized to bone - Status post left modified radical mastectomy 10/2005. Status post radiation therapy and anti-estrogen therapy. - Continue Femara. - Will notify Dr. Jana Hakim of the patient's admission.    Nausea vomiting and diarrhea - Stool for C. difficile PCR requested. - Continue supportive care with antinausea medications as needed.    Diabetes mellitus type 2, controlled - Continue Amaryl and Tradjenta. Also on insulin sensitive SSI.    DVT Prophylaxis - Lovenox ordered.  Code Status: Full. Family Communication: No family at the bedside. Disposition Plan: Home when stable.  Lives with husband.  Possibly home 4/23, may need home oxygen.   IV Access:    Peripheral IV   Procedures and diagnostic studies:   Dg Chest 2 View 02/07/2015: No radiographic evidence of active cardiopulmonary disease. Left greater than right apical scarring.  Multifocal sclerotic osseous metastases.   Ct Angio Chest Pe W/cm &/or Wo Cm 02/07/2015: No pulmonary embolism.   (the right upper lobe airspace and interstitial opacities, favored to reflect post treatment/radiation change.  Diffuse osseous sclerotic metastases.  No acute intrathoracic process.      Medical Consultants:    None.  Anti-Infectives:    None.  Subjective:   [name] denies any dyspnea.  Has a dry cough.  No loose stools today but has has gas and indigestion.  Feels weak, tired and has a poor appetite.  Objective:    Filed Vitals:   02/07/15 0001 02/07/15 0257 02/07/15 0445 02/07/15 0507  BP: 140/54 130/106 147/52   Pulse: 88 92 72   Temp:  98 F (36.7 C) 98.2 F (36.8 C)   TempSrc:  Oral Oral   Resp: 12 16 15    Height:   5\' 4"  (1.626 m)   Weight:   62.2 kg (137 lb 2 oz)   SpO2: 92% 96% 97% 88%    Intake/Output Summary (Last 24 hours) at 02/07/15 0807 Last data filed at 02/07/15 0446  Gross per 24 hour  Intake    240 ml  Output      0 ml  Net    240 ml    Exam: Gen:  NAD Cardiovascular:  RRR, No M/R/G Respiratory:  Lungs CTAB Gastrointestinal:  Abdomen soft, NT/ND, + B Extremities:  No C/E/C   Data Reviewed:    Labs: Basic Metabolic Panel:  Recent Labs Lab 02/06/15 2204 02/07/15 0540  NA 140 139  K 3.7 3.6  CL 107 108  CO2 24 24  GLUCOSE 190* 150*  BUN 13 10  CREATININE 0.75 0.67  CALCIUM 9.8  9.2   GFR Estimated Creatinine Clearance: 51.7 mL/min (by C-G formula based on Cr of 0.67). Liver Function Tests:  Recent Labs Lab 02/06/15 2204  AST 80*  ALT 36*  ALKPHOS 162*  BILITOT 0.9  PROT 7.1  ALBUMIN 4.0    Recent Labs Lab 02/06/15 2204  LIPASE 40   CBC:  Recent Labs Lab 02/06/15 2204 02/07/15 0540  WBC 7.8 10.1  NEUTROABS 3.2 4.2  HGB 11.5* 11.0*  HCT 33.8* 32.5*  MCV 103.7* 104.8*  PLT 127* 130*   Cardiac Enzymes:  Recent Labs Lab 02/07/15 0540  CKTOTAL 73  TROPONINI <0.03   Thyroid function studies:  Recent Labs  02/07/15 0540  TSH 7.367*   Sepsis Labs:  Recent Labs Lab 02/06/15 2204  02/06/15 2356 02/07/15 0540  WBC 7.8  --  10.1  LATICACIDVEN  --  1.14 1.5   Microbiology No results found for this or any previous visit (from the past 240 hour(s)).   Medications:   . antiseptic oral rinse  7 mL Mouth Rinse BID  . beclomethasone  2 puff Inhalation BID  . cholecalciferol  1,000 Units Oral BID  . clonazePAM  0.5 mg Oral BID  . enoxaparin (LOVENOX) injection  40 mg Subcutaneous Q24H  . fluconazole  150 mg Oral Daily  . glimepiride  1 mg Oral Daily  . hydrochlorothiazide  12.5 mg Oral Q M,W,F  . insulin aspart  0-9 Units Subcutaneous TID WC  . letrozole  2.5 mg Oral Daily  . linagliptin  5 mg Oral Daily  . loteprednol  1 drop Both Eyes Daily  . nebivolol  5 mg Oral Daily  . ondansetron  4 mg Oral Q6H  . pantoprazole  40 mg Oral Daily  . rosuvastatin  5 mg Oral q1800  . sodium chloride  3 mL Intravenous Q12H  . triamterene-hydrochlorothiazide  1 tablet Oral Once per day on Mon Wed Fri   Continuous Infusions:   Time spent: 25 minutes.     [place] [facility] Pager [phone]. If unable to reach me by pager, please call my cell phone at [phone].  *Please refer to [email or web address], password [email or web address] to get updated schedule on who will round on this patient, as hospitalists switch teams weekly. If 7PM-7AM, please contact night-coverage at [email or web address], password [email or web address] for any overnight needs.  02/07/2015, 8:07 AM

## 2015-02-07 NOTE — ED Notes (Signed)
Pt return from XRAY

## 2015-02-07 NOTE — ED Notes (Signed)
Pt ambulated to the bathroom.  Upon return to room pt's O2 sat was 83-84% on RA.  After approximately 5 minutes of deep breathing while in bed O2 sats improved to 94% on RA.  Pt reports feeling extremely SOB after walking and has mentioned this to her PCP.  Joe, PA is aware.

## 2015-02-07 NOTE — Progress Notes (Signed)
UR completed 

## 2015-02-07 NOTE — ED Notes (Signed)
Bed: KH99 Expected date:  Expected time:  Means of arrival:  Comments: Hold for rm 3

## 2015-02-07 NOTE — ED Notes (Signed)
Spoke with Mechele Claude RN on 4 E. 20 min start at 0357. RN aware

## 2015-02-07 NOTE — Progress Notes (Signed)
INITIAL NUTRITION ASSESSMENT  Pt meets criteria for NON-SEVERE (moderate) MALNUTRITION in the context of chronic illness, social or environmental circumstances as evidenced by mil/moderate muscle wasting, 9 lb weight loss (6% body weight) in 1 month.  DOCUMENTATION CODES Per approved criteria  -Non-severe (moderate) malnutrition in the context of chronic illness -Non-severe (moderate) malnutrition in the context of social or environmental circumstances   INTERVENTION: - Continue Carb Modified/Heart Healthy diet - Will order Glucerna Shake po TID, each supplement provides 220 kcal and 10 grams of protein  NUTRITION DIAGNOSIS: Non-severe malnutrition related to chronic illness, social and environmental circumstances as evidenced by mild/moderate muscle wasting, 9 lb weight loss (6% body weight) in 1 month.   Goal: Pt to meet >/=90% estimated needs  Monitor:  Meal and supplement intakes, weight trends, labs, I/O's  Reason for Assessment: Malnutrition Screening Tool assessment  79 y.o. female  Admitting Dx: Exertional dyspnea  ASSESSMENT: Pt is a 79 year old female who has had nausea/vomiting/diarrhea for 2 days PTA. She has hx of metastatic breast cancer, DM, HTN, HLD, and asthma. CT done in hospital does not show lung mets or PE.  Pt seen for MST. BMI WDL. Pt reports she was able to eat about 50% of breakfast this AM but that she felt nauseous afterward. She indicates that spells of N/V have been going on for 6 months with poor appetite x9 months. She states that her and her husband eat a big breakfast and sometimes this is all they eat for the day. She indicates that they do not have enough money and that they often have to "go without" and that cannot afford adequate amounts of food. She states that she was previously buying Glucerna when she had extra money but has been unable to the past few months. She reports UBW of 150 lbs and that she began losing weight 9 months ago.  Pt  denies chewing/swallowing issues. She states that  She would like to receive Glucerna here in the hospital.  Not meeting needs based on N/V, social environment. Physical assessment shows mild/moderate muscle wasting to temple and clavicle with all other areas WDL. Labs and medications reviewed.  Height: Ht Readings from Last 1 Encounters:  02/07/15 5\' 4"  (1.626 m)    Weight: Wt Readings from Last 1 Encounters:  02/07/15 137 lb 2 oz (62.2 kg)    Ideal Body Weight: 120 lbs (54.54 kg)  % Ideal Body Weight: 114%  Wt Readings from Last 10 Encounters:  02/07/15 137 lb 2 oz (62.2 kg)  01/21/15 142 lb 6.4 oz (64.592 kg)  12/10/14 146 lb 9.6 oz (66.497 kg)  11/21/14 143 lb 3.2 oz (64.955 kg)  11/15/14 140 lb (63.504 kg)  09/25/14 143 lb 4 oz (64.978 kg)  07/24/14 146 lb 6.4 oz (66.407 kg)  05/23/14 147 lb 14.4 oz (67.087 kg)  02/28/14 147 lb 14.4 oz (67.087 kg)  02/01/14 148 lb 12.8 oz (67.495 kg)    Usual Body Weight: 150 lbs (68.18 kg)  % Usual Body Weight: 91%  BMI:  Body mass index is 23.53 kg/(m^2).  Estimated Nutritional Needs: Kcal: 1500-1700 Protein: 60-80 grams Fluid: 2L/day  Skin: WDL  Diet Order: Diet Carb Modified Fluid consistency:: Thin; Room service appropriate?: Yes  EDUCATION NEEDS: -No education needs identified at this time   Intake/Output Summary (Last 24 hours) at 02/07/15 1324 Last data filed at 02/07/15 0446  Gross per 24 hour  Intake    240 ml  Output  0 ml  Net    240 ml    Last BM: pta   Labs:   Recent Labs Lab 02/06/15 2204 02/07/15 0540  NA 140 139  K 3.7 3.6  CL 107 108  CO2 24 24  BUN 13 10  CREATININE 0.75 0.67  CALCIUM 9.8 9.2  GLUCOSE 190* 150*    CBG (last 3)   Recent Labs  02/07/15 0444 02/07/15 0743 02/07/15 1147  GLUCAP 148* 133* 107*    Scheduled Meds: . antiseptic oral rinse  7 mL Mouth Rinse BID  . cholecalciferol  1,000 Units Oral BID  . clonazePAM  0.5 mg Oral BID  . enoxaparin (LOVENOX)  injection  40 mg Subcutaneous Q24H  . fluconazole  150 mg Oral Daily  . fluticasone  2 puff Inhalation BID  . glimepiride  1 mg Oral Daily  . hydrochlorothiazide  12.5 mg Oral Q M,W,F  . insulin aspart  0-9 Units Subcutaneous TID WC  . letrozole  2.5 mg Oral Daily  . linagliptin  5 mg Oral Daily  . loteprednol  1 drop Both Eyes Daily  . nebivolol  5 mg Oral Daily  . ondansetron  4 mg Oral Q6H  . pantoprazole  40 mg Oral Daily  . rosuvastatin  5 mg Oral q1800  . sodium chloride  3 mL Intravenous Q12H  . triamterene-hydrochlorothiazide  1 tablet Oral Once per day on Mon Wed Fri    Continuous Infusions:   Past Medical History  Diagnosis Date  . Anxiety   . Asthma   . Breast cancer     metastasized  . Depression   . Diabetes mellitus   . GERD (gastroesophageal reflux disease)   . Hyperlipidemia   . Internal hemorrhoids   . IBS (irritable bowel syndrome)   . Mitral valve prolapse   . Ischemic colitis   . Fibromyalgia   . Allergy   . Cataract   . S/P radiation therapy within four to twelve weeks 04/26/13-05/11/13    L-spine/sacrum 30Gy/27fx  . Colon polyp 2013    TUBULAR ADENOMA    Past Surgical History  Procedure Laterality Date  . Mastectomy Left     then treated with chemo and radiation  . Total abdominal hysterectomy    . Back surgery      synovial cyst removed from spine  . Carpal tunnel release Bilateral   . Dilation and curettage of uterus    . Appendectomy    . Cataract extraction Bilateral   . Colonoscopy w/ biopsies  2013  . Esophagogastroduodenoscopy  2008    normal    Jarome Matin, [place], [facility] Inpatient Clinical Dietitian Pager # [phone] After hours/weekend pager # [phone]

## 2015-02-07 NOTE — H&P (Signed)
[facility] History and Physical  Jamie [name] GEX:[id number] DOB: [birth year] DOA: 02/06/2015  Referring physician: Dr.Oni. PCP: [name], MD  Specialists: Dr.Magrinat. Oncologist.                      Dr. Wynonia Lawman. Cardiologist.   Chief Complaint: Nausea vomiting diarrhea and shortness of breath.  HPI: Jamie [name] is a 79 y.o. female with history of metastatic breast cancer, diabetes mellitus, hypertension, hyperlipidemia, asthma presents to the ER because of worsening shortness of breath and nausea and vomiting and diarrhea. Patient states yesterday she had one episode of nausea vomiting diarrhea and also has been having a couple of episodes of diarrhea over the last 2-3 days. Patient also states that she has been getting easily short of breath on exertion. Has chronic chest pain which patient states is not new. Denies any recent use of antibiotics. (Has used antibiotics 2 months ago.). In the ER patient was found to be hypoxic with minimal exertion. At rest patient is not hypoxic. CT angiogram of the chest was negative for any PE. Patient has been admitted for further management of hypoxia and diarrhea. Patient's oncologist is originally planned to get MRI of the C-spine and bone scan as outpatient. Patient's oncologist had prescribed Effexor which patient has not taken since patient was concerned that it may cause tachycardia. Patient also has history of asthma and has not been taking her inhalers last few weeks. Patient on exam does not have any wheeze.  Review of Systems: As presented in the history of presenting illness, rest negative.  Past Medical History  Diagnosis Date  . Anxiety   . Asthma   . Breast cancer     metastasized  . Depression   . Diabetes mellitus   . GERD (gastroesophageal reflux disease)   . Hyperlipidemia   . Internal hemorrhoids   . IBS (irritable bowel syndrome)   . Mitral valve prolapse   . Ischemic colitis   . Fibromyalgia   . Allergy    . Cataract   . S/P radiation therapy within four to twelve weeks 04/26/13-05/11/13    L-spine/sacrum 30Gy/74fx  . Colon polyp 2013    TUBULAR ADENOMA   Past Surgical History  Procedure Laterality Date  . Mastectomy Left     then treated with chemo and radiation  . Total abdominal hysterectomy    . Back surgery      synovial cyst removed from spine  . Carpal tunnel release Bilateral   . Dilation and curettage of uterus    . Appendectomy    . Cataract extraction Bilateral   . Colonoscopy w/ biopsies  2013  . Esophagogastroduodenoscopy  2008    normal   Social History:  reports that she has never smoked. She has never used smokeless tobacco. She reports that she does not drink alcohol or use illicit drugs. Where does patient live at home. Can patient participate in ADLs? Yes.  Allergies  Allergen Reactions  . Hydrocodone Anxiety    Pt. Said she is fine with this medication  . Metformin And Related     GI sxs  . Morphine And Related   . Niacin Other (See Comments)    Dizziness   . Promethazine Hcl Other (See Comments)    Dizziness "FELT CRAZY"     Family History:  Family History  Problem Relation Age of Onset  . Prostate cancer Brother     x2  . Irritable bowel syndrome Mother   .  Colon cancer Neg Hx   . Stroke Father       Prior to Admission medications   Medication Sig Start Date End Date Taking? Authorizing Provider  ACCU-CHEK SMARTVIEW test strip  08/25/14  Yes Historical Provider, MD  acetaminophen (TYLENOL) 500 MG tablet Take 500 mg by mouth every 6 (six) hours as needed for mild pain or moderate pain (pain).    Yes Historical Provider, MD  BYSTOLIC 5 MG tablet Take 1 tablet by mouth Daily. 02/16/11  Yes Historical Provider, MD  Calcium Carbonate (CALCIUM 600 PO) Take 1 tablet by mouth daily.   Yes Historical Provider, MD  Cholecalciferol (VITAMIN D) 1000 UNITS capsule Take 1,000 Units by mouth 2 (two) times daily.    Yes Historical Provider, MD  clonazePAM  (KLONOPIN) 0.5 MG tablet Take 0.5 mg by mouth 2 (two) times daily.    Yes Historical Provider, MD  CRESTOR 5 MG tablet Take 1 tablet by mouth Daily. 02/05/11  Yes Historical Provider, MD  esomeprazole (NEXIUM) 40 MG capsule Take 1 capsule (40 mg total) by mouth daily. 09/25/14  Yes Jessica D Zehr, PA-C  glimepiride (AMARYL) 1 MG tablet Take 1 mg by mouth daily.    Yes Historical Provider, MD  GLYCERIN ADULT 2 G suppository Place 1 suppository rectally daily as needed for mild constipation (constipation).  01/21/14  Yes Historical Provider, MD  hydrochlorothiazide (HYDRODIURIL) 25 MG tablet Take 12.5 mg by mouth every Monday, Wednesday, and Friday.    Yes Historical Provider, MD  letrozole (FEMARA) 2.5 MG tablet TAKE 1 TABLET (2.5 MG TOTAL) BY MOUTH DAILY. 09/06/14  Yes Chauncey Cruel, MD  lidocaine-hydrocortisone [facility]) 3-0.5 % CREA Place 1 Applicatorful rectally 2 (two) times daily as needed for hemorrhoids and bleeding. 05/17/13  Yes Shon Baton, MD  meclizine (ANTIVERT) 25 MG tablet Take 25 mg by mouth 2 (two) times daily as needed for dizziness (dizziness).  12/13/10  Yes Historical Provider, MD  ONGLYZA 5 MG TABS tablet Take 5 mg by mouth 2 (two) times daily.  02/24/12  Yes Historical Provider, MD  Polyethyl Glycol-Propyl Glycol (SYSTANE OP) Apply 1-2 drops to eye daily as needed (dry eyes).    Yes Historical Provider, MD  PROAIR HFA 108 (90 BASE) MCG/ACT inhaler Inhale 1-2 puffs into the lungs every 6 (six) hours as needed for wheezing or shortness of breath (wheezing).  05/30/12  Yes Historical Provider, MD  QVAR 80 MCG/ACT inhaler Inhale 2 puffs into the lungs 2 (two) times daily.  09/22/14  Yes Historical Provider, MD  triamterene-hydrochlorothiazide (MAXZIDE) 75-50 MG per tablet Take 0.5 tablets by mouth 3 (three) times a week.  09/02/14  Yes Historical Provider, MD  ALREX 0.2 % SUSP Place 1 drop into both eyes daily.  08/20/14   Historical Provider, MD  fluconazole (DIFLUCAN) 150 MG tablet  Take 150 mg by mouth daily. Take 1 Tablet Every 10 Days. 02/04/15   Historical Provider, MD  hydrocortisone (ANUSOL-HC) 2.5 % rectal cream Place 1 application rectally daily as needed for hemorrhoids or itching.  05/18/13   Shon Baton, MD  ondansetron (ZOFRAN) 4 MG tablet Take 1 tablet (4 mg total) by mouth every 6 (six) hours. 01/21/15   Chauncey Cruel, MD  oxyCODONE-acetaminophen (PERCOCET/ROXICET) 5-325 MG per tablet Take 1 tablet by mouth every 6 (six) hours as needed for severe pain. Patient not taking: Reported on 02/06/2015 10/23/14   Chauncey Cruel, MD  sucralfate (CARAFATE) 1 G tablet Take 1 g by mouth 4 (  four) times daily as needed.     Historical Provider, MD  venlafaxine XR (EFFEXOR-XR) 37.5 MG 24 hr capsule Take 1 capsule (37.5 mg total) by mouth daily with breakfast. 01/21/15   Chauncey Cruel, MD    Physical Exam: Filed Vitals:   02/07/15 0001 02/07/15 0257 02/07/15 0445 02/07/15 0507  BP: 140/54 130/106 147/52   Pulse: 88 92 72   Temp:  98 F (36.7 C) 98.2 F (36.8 C)   TempSrc:  Oral Oral   Resp: 12 16 15    Height:   5\' 4"  (1.626 m)   Weight:   62.2 kg (137 lb 2 oz)   SpO2: 92% 96% 97% 88%     General:  Moderately built and nourished.  Eyes: Anicteric no pallor.  ENT: No discharge from the ears eyes nose and mouth.  Neck: No mass felt. No neck rigidity.  Cardiovascular: S1-S2 heard.  Respiratory: No rhonchi or crepitations.  Abdomen: Soft nontender bowel sounds present.  Skin: No rash.  Musculoskeletal: No edema.  Psychiatric: Appears normal.  Neurologic: Alert awake oriented to time place and person. Moves all extremities.  Labs on Admission:  Basic Metabolic Panel:  Recent Labs Lab 02/06/15 2204  NA 140  K 3.7  CL 107  CO2 24  GLUCOSE 190*  BUN 13  CREATININE 0.75  CALCIUM 9.8   Liver Function Tests:  Recent Labs Lab 02/06/15 2204  AST 80*  ALT 36*  ALKPHOS 162*  BILITOT 0.9  PROT 7.1  ALBUMIN 4.0    Recent Labs Lab  02/06/15 2204  LIPASE 40   No results for input(s): AMMONIA in the last 168 hours. CBC:  Recent Labs Lab 02/06/15 2204 02/07/15 0540  WBC 7.8 10.1  NEUTROABS 3.2 4.2  HGB 11.5* 11.0*  HCT 33.8* 32.5*  MCV 103.7* 104.8*  PLT 127* 130*   Cardiac Enzymes: No results for input(s): CKTOTAL, CKMB, CKMBINDEX, TROPONINI in the last 168 hours.  BNP (last 3 results) No results for input(s): BNP in the last 8760 hours.  ProBNP (last 3 results) No results for input(s): PROBNP in the last 8760 hours.  CBG: No results for input(s): GLUCAP in the last 168 hours.  Radiological Exams on Admission: Dg Chest 2 View  02/07/2015   CLINICAL DATA:  Generalized weakness, history of breast cancer.  EXAM: CHEST  2 VIEW  COMPARISON:  01/28/2015 CT  FINDINGS: Cardiomediastinal contours within normal range. Left mastectomy. Left greater than right apical pleural-parenchymal thickening/scarring. Otherwise, no confluent airspace opacity, pleural effusion, pneumothorax. Multifocal areas of sclerosis, in keeping with osseous metastases.  IMPRESSION: No radiographic evidence of active cardiopulmonary disease. Left greater than right apical scarring.  Multifocal sclerotic osseous metastases.   Electronically Signed   By: Carlos Levering [name].   On: 02/07/2015 01:59   Ct Angio Chest Pe W/cm &/or Wo Cm  02/07/2015   CLINICAL DATA:  Shortness of breath metastatic breast cancer.  EXAM: CT ANGIOGRAPHY CHEST WITH CONTRAST  TECHNIQUE: Multidetector CT imaging of the chest was performed using the standard protocol during bolus administration of intravenous contrast. Multiplanar CT image reconstructions and MIPs were obtained to evaluate the vascular anatomy.  CONTRAST:  11mL OMNIPAQUE IOHEXOL 350 MG/ML SOLN  COMPARISON:  01/28/2015  FINDINGS: No pulmonary embolism.  Normal caliber thoracic aorta with mild scattered atherosclerotic disease. Upper normal heart size. Trace pericardial fluid. No pleural effusion.   Subcentimeter short axis mediastinal lymph nodes. Postoperative changes of left mastectomy. Upper abdominal images show nothing acute.  Central airways are patent. No pneumothorax. Right apical airspace and ground-glass opacities, similar to prior in favored to be post treatment related. Similarly, subpleural peripheral opacity left upper lobe anteriorly with bronchiectatic changes favored to be post radiation related.  Multifocal sclerotic lesions throughout the visualized bones.  Review of the MIP images confirms the above findings.  IMPRESSION: No pulmonary embolism.  (the right upper lobe airspace and interstitial opacities, favored to reflect post treatment/radiation change.  Diffuse osseous sclerotic metastases.  No acute intrathoracic process.   Electronically Signed   By: Carlos Levering [name].   On: 02/07/2015 02:39    EKG: Independently reviewed. Normal sinus rhythm.  Assessment/Plan Principal Problem:   Exertional dyspnea Active Problems:   Essential hypertension   Breast cancer metastasized to bone   Nausea vomiting and diarrhea   Diabetes mellitus type 2, controlled   1. Exertional shortness of breath - cause not clear. We will check BNP and TSH troponin and 2-D echo. May need home oxygen if patient qualifies. Further recommendation based on the test ordered. Patient's symptoms may be secondary to deconditioning for which I have requested physical therapy consult. 2. Nausea vomiting and diarrhea - abdomen appears benign. Lactic acid is within acceptable limits. Check stool for C. Difficile. 3. Diabetes mellitus type 2 - continue home medications with sliding scale coverage. 4. Hypertension - continue home medications. 5. History of mitral valve prolapse - follow 2-D echo. 6. History of asthma - presently not wheezing patient has not been using her inhalers. I have placed patient back on her inhalers. 7. Chronic anemia - follow CBC. 8. Metastatic breast cancer - per  oncologist.   DVT Prophylaxis Lovenox.  Code Status: Full code.  Family Communication: Discussed with patient.  Disposition Plan: Admit for observation.    Hisham Provence N. [facility] Pager [phone].  If 7PM-7AM, please contact night-coverage [email or web address] Password [facility] 02/07/2015, 5:53 AM

## 2015-02-07 NOTE — Care Management Note (Addendum)
    Page 1 of 1   02/07/2015     2:23:03 PM CARE MANAGEMENT NOTE 02/07/2015  Patient:  Jamie [name], Jamie [name]   Account Number:  [id number]  Date Initiated:  02/07/2015  Documentation initiated by:  Dessa Phi  Subjective/Objective Assessment:   79 y/o f admitted w/SOB.EK:BTCYEL ca.     Action/Plan:   From home.Has cane,rw,3n1.   Anticipated DC Date:  02/10/2015   Anticipated DC Plan:  [place]  CM consult      Choice offered to / List presented to:  C-1 Patient        [place] arranged  HH-1 RN  [place].   Status of service:  In process, will continue to follow Medicare Important Message given?   (If response is "NO", the following Medicare IM given date fields will be blank) Date Medicare IM given:   Medicare IM given by:   Date Additional Medicare IM given:   Additional Medicare IM given by:    Discharge Disposition:    Per UR Regulation:  Reviewed for med. necessity/level of care/duration of stay  If discussed at [place] of Stay Meetings, dates discussed:    Comments:  02/07/15 Dessa Phi RN BSN NCM [phone] [facility] chosen for [facility].HHRN/HHPT orders placed-[facility] rep aware.Qualfies for home 02-will send current documentation to Huey Romans to start process for verification.Await home 02 order.

## 2015-02-07 NOTE — Evaluation (Signed)
Physical Therapy Evaluation Patient Details Name: Jamie [name] MRN: [id number] DOB: Feb 28, 1936 Today's Date: 02/07/2015   History of Present Illness  Pt is a 79 y.o. female with history of metastatic breast cancer, diabetes mellitus, hypertension, hyperlipidemia, asthma presents to the ER because of worsening shortness of breath and nausea and vomiting and diarrhea. Patient has been admitted for further management of SOB on exertion and diarrhea.    Clinical Impression  Pt admitted with above and currently with functional limitations due to the deficits listed below (see PT Problem List). Pt will benefit from skilled PT to increase their independence and safety with mobility to allow discharge home. Pt session was limited by fatigue. Pt SpO2 was 92% on room air at rest and dropped to 83% on room air after ambulation. 2L O2 was applied and pt SpO2 rose to 96% during ambulation.     Follow Up Recommendations Home health PT    Equipment Recommendations  None recommended by PT    Recommendations for Other Services       Precautions / Restrictions Precautions Precautions: Fall Precaution Comments: monitor O2 sats. Restrictions Weight Bearing Restrictions: No      Mobility  Bed Mobility Overal bed mobility: Needs Assistance Bed Mobility: Supine to Sit     Supine to sit: Min assist     General bed mobility comments: assist to get trunk OOB.   Transfers Overall transfer level: Needs assistance Equipment used: None Transfers: Sit to/from Stand Sit to Stand: Min guard         General transfer comment: min guard for safety  Ambulation/Gait Ambulation/Gait assistance: Min guard Ambulation Distance (Feet): 40 Feet Assistive device: None Gait Pattern/deviations: Step-through pattern     General Gait Details: pt reports of SOB during ambulation as well as blurry vision. SpO2 dropped to 83% during ambulation and required 2L O2  Stairs            Wheelchair  Mobility    Modified Rankin (Stroke Patients Only)       Balance Overall balance assessment: No apparent balance deficits (not formally assessed) (pt reports of no falls)                                           Pertinent Vitals/Pain Pain Assessment: No/denies pain    Home Living Family/patient expects to be discharged to:: Private residence Living Arrangements: Spouse/significant other   Type of Home: House Home Access: Level entry     Home Layout: One level Home Equipment: Environmental consultant - 2 wheels;Cane - single point      Prior Function Level of Independence: Independent         Comments: pt states she uses walker and cane when needed.      Hand Dominance        Extremity/Trunk Assessment               Lower Extremity Assessment: Overall WFL for tasks assessed         Communication   Communication: No difficulties  Cognition Arousal/Alertness: Awake/alert Behavior During Therapy: WFL for tasks assessed/performed Overall Cognitive Status: Within Functional Limits for tasks assessed                      General Comments      Exercises        Assessment/Plan    PT Assessment  Patient needs continued PT services  PT Diagnosis Difficulty walking;Generalized weakness   PT Problem List Decreased strength;Decreased activity tolerance;Decreased mobility;Cardiopulmonary status limiting activity  PT Treatment Interventions Gait training;Functional mobility training;Therapeutic activities;Therapeutic exercise;Patient/family education   PT Goals (Current goals can be found in the Care Plan section) Acute Rehab PT Goals PT Goal Formulation: With patient Time For Goal Achievement: 02/21/15 Potential to Achieve Goals: Good    Frequency Min 3X/week   Barriers to discharge        Co-evaluation               End of Session   Activity Tolerance: Patient limited by fatigue Patient left: in chair;with call bell/phone  within reach;with chair alarm set;with nursing/sitter in room Nurse Communication: Mobility status    Functional Assessment Tool Used: clinical judgement Functional Limitation: Mobility: Walking and moving around Mobility: Walking and Moving Around Current Status (T5573): At least 1 percent but less than 20 percent impaired, limited or restricted Mobility: Walking and Moving Around Goal Status [phone]): 0 percent impaired, limited or restricted    Time: 4270-6237 PT Time Calculation (min) (ACUTE ONLY): 14 min   Charges:   PT Evaluation $Initial PT Evaluation Tier I: 1 Procedure     PT G Codes:   PT G-Codes **NOT FOR INPATIENT CLASS** Functional Assessment Tool Used: clinical judgement Functional Limitation: Mobility: Walking and moving around Mobility: Walking and Moving Around Current Status (S2831): At least 1 percent but less than 20 percent impaired, limited or restricted Mobility: Walking and Moving Around Goal Status [phone]): 0 percent impaired, limited or restricted    Amyria Komar 02/07/2015, 10:46 AM Charlott Holler, SPT

## 2015-02-07 NOTE — Progress Notes (Signed)
SATURATION QUALIFICATIONS: (This note is used to comply with regulatory documentation for home oxygen)  Patient Saturations on Room Air at Rest = 92%  Patient Saturations on Room Air while Ambulating = 83%  Patient Saturations on 2 Liters of oxygen while Ambulating = 96%  Please briefly explain why patient needs home oxygen: to maintain oxygen saturations above 88% during physical activity such as ambulation.  Carmelia Bake, PT, DPT 02/07/2015 Pager: [phone]

## 2015-02-07 NOTE — ED Provider Notes (Signed)
Patient received in sign out from PA Hartwick at shift change.  Briefly, 79 year old female with multitude of complaints. Notably she has exertional shortness of breath over the past month. She has spoke with her PCP about this, however nothing was specifically done. Here she was found to be hypoxic into the mid 80s when ambulating to the restroom, no hypoxia noted at rest. Her workup thus far is reassuring.  Chest x-ray without signs of pneumonia. Patient does have active breast cancer, CT angio of chest will be obtained to rule out PE/lung mets.  Plan:  CTA pending.  Patient will need admission irregardless of results due to hypoxia.  Results for orders placed or performed during the hospital encounter of 02/06/15  CBC with Differential  Result Value Ref Range   WBC 7.8 4.0 - 10.5 K/uL   RBC 3.26 (L) 3.87 - 5.11 MIL/uL   Hemoglobin 11.5 (L) 12.0 - 15.0 g/dL   HCT 33.8 (L) 36.0 - 46.0 %   MCV 103.7 (H) 78.0 - 100.0 fL   MCH 35.3 (H) 26.0 - 34.0 pg   MCHC 34.0 30.0 - 36.0 g/dL   RDW 16.0 (H) 11.5 - 15.5 %   Platelets 127 (L) 150 - 400 K/uL   Neutrophils Relative % 41 (L) 43 - 77 %   Lymphocytes Relative 47 (H) 12 - 46 %   Monocytes Relative 11 3 - 12 %   Eosinophils Relative 1 0 - 5 %   Basophils Relative 0 0 - 1 %   Neutro Abs 3.2 1.7 - 7.7 K/uL   Lymphs Abs 3.6 0.7 - 4.0 K/uL   Monocytes Absolute 0.9 0.1 - 1.0 K/uL   Eosinophils Absolute 0.1 0.0 - 0.7 K/uL   Basophils Absolute 0.0 0.0 - 0.1 K/uL   RBC Morphology POLYCHROMASIA PRESENT    WBC Morphology MILD LEFT SHIFT (1-5% METAS, OCC MYELO, OCC BANDS)   Comprehensive metabolic panel  Result Value Ref Range   Sodium 140 135 - 145 mmol/L   Potassium 3.7 3.5 - 5.1 mmol/L   Chloride 107 96 - 112 mmol/L   CO2 24 19 - 32 mmol/L   Glucose, Bld 190 (H) 70 - 99 mg/dL   BUN 13 6 - 23 mg/dL   Creatinine, Ser 0.75 0.50 - 1.10 mg/dL   Calcium 9.8 8.4 - 10.5 mg/dL   Total Protein 7.1 6.0 - 8.3 g/dL   Albumin 4.0 3.5 - 5.2 g/dL   AST 80 (H) 0  - 37 U/L   ALT 36 (H) 0 - 35 U/L   Alkaline Phosphatase 162 (H) 39 - 117 U/L   Total Bilirubin 0.9 0.3 - 1.2 mg/dL   GFR calc non Af Amer 80 (L) >90 mL/min   GFR calc Af Amer >90 >90 mL/min   Anion gap 9 5 - 15  Lipase, blood  Result Value Ref Range   Lipase 40 11 - 59 U/L  Urinalysis, Routine w reflex microscopic  Result Value Ref Range   Color, Urine YELLOW YELLOW   APPearance CLEAR CLEAR   Specific Gravity, Urine 1.010 1.005 - 1.030   pH 6.0 5.0 - 8.0   Glucose, UA NEGATIVE NEGATIVE mg/dL   Hgb urine dipstick NEGATIVE NEGATIVE   Bilirubin Urine NEGATIVE NEGATIVE   Ketones, ur NEGATIVE NEGATIVE mg/dL   Protein, ur NEGATIVE NEGATIVE mg/dL   Urobilinogen, UA 0.2 0.0 - 1.0 mg/dL   Nitrite NEGATIVE NEGATIVE   Leukocytes, UA NEGATIVE NEGATIVE  I-stat troponin, ED  Result Value  Ref Range   Troponin i, poc 0.00 0.00 - 0.08 ng/mL   Comment 3          I-Stat CG4 Lactic Acid, ED  Result Value Ref Range   Lactic Acid, Venous 1.14 0.5 - 2.0 mmol/L   Dg Chest 2 View  02/07/2015   CLINICAL DATA:  Generalized weakness, history of breast cancer.  EXAM: CHEST  2 VIEW  COMPARISON:  01/28/2015 CT  FINDINGS: Cardiomediastinal contours within normal range. Left mastectomy. Left greater than right apical pleural-parenchymal thickening/scarring. Otherwise, no confluent airspace opacity, pleural effusion, pneumothorax. Multifocal areas of sclerosis, in keeping with osseous metastases.  IMPRESSION: No radiographic evidence of active cardiopulmonary disease. Left greater than right apical scarring.  Multifocal sclerotic osseous metastases.   Electronically Signed   By: Carlos Levering [name].   On: 02/07/2015 01:59   Ct Angio Chest Pe W/cm &/or Wo Cm  02/07/2015   CLINICAL DATA:  Shortness of breath metastatic breast cancer.  EXAM: CT ANGIOGRAPHY CHEST WITH CONTRAST  TECHNIQUE: Multidetector CT imaging of the chest was performed using the standard protocol during bolus administration of intravenous  contrast. Multiplanar CT image reconstructions and MIPs were obtained to evaluate the vascular anatomy.  CONTRAST:  160mL OMNIPAQUE IOHEXOL 350 MG/ML SOLN  COMPARISON:  01/28/2015  FINDINGS: No pulmonary embolism.  Normal caliber thoracic aorta with mild scattered atherosclerotic disease. Upper normal heart size. Trace pericardial fluid. No pleural effusion.  Subcentimeter short axis mediastinal lymph nodes. Postoperative changes of left mastectomy. Upper abdominal images show nothing acute.  Central airways are patent. No pneumothorax. Right apical airspace and ground-glass opacities, similar to prior in favored to be post treatment related. Similarly, subpleural peripheral opacity left upper lobe anteriorly with bronchiectatic changes favored to be post radiation related.  Multifocal sclerotic lesions throughout the visualized bones.  Review of the MIP images confirms the above findings.  IMPRESSION: No pulmonary embolism.  (the right upper lobe airspace and interstitial opacities, favored to reflect post treatment/radiation change.  Diffuse osseous sclerotic metastases.  No acute intrathoracic process.   Electronically Signed   By: Carlos Levering [name].   On: 02/07/2015 02:39   Ct Angio Chest Pe W/cm &/or Wo Cm  01/28/2015   CLINICAL DATA:  Persistent shortness of breath for 3 weeks, history of left mastectomy for breast carcinoma with radiation and chemotherapy  EXAM: CT ANGIOGRAPHY CHEST WITH CONTRAST  TECHNIQUE: Multidetector CT imaging of the chest was performed using the standard protocol during bolus administration of intravenous contrast. Multiplanar CT image reconstructions and MIPs were obtained to evaluate the vascular anatomy.  CONTRAST:  134mL OMNIPAQUE IOHEXOL 350 MG/ML SOLN  COMPARISON:  CT chest of 02/25/2014  FINDINGS: Pleuro parenchymal scarring in the lung apices left-greater-than-right appears stable. Surgical clips are noted in the left axilla from prior left mastectomy and nodal  dissection. Radiation fibrosis is present anteriorly within the left upper lobe and is stable. Scarring in the right middle lobe medially and anteriorly is stable. Patchy foci of scarring in the right upper lobe are unchanged. Diffuse blastic bony lesions are noted throughout the vertebra and ribs consistent with bone metastases somewhat somewhat increased compared to the prior CT images.  On soft tissue window images, the pulmonary arteries are well opacified. There is no evidence of acute pulmonary embolism. The thoracic aorta opacifies with no significant abnormality noted. No pathologically enlarged mediastinal nodes are seen. The thyroid gland is unremarkable. No axillary adenopathy is seen. The portion of the upper abdomen that is  visualized is unremarkable.  Review of the MIP images confirms the above findings.  IMPRESSION: 1. No evidence of pulmonary embolism. 2. Foci of scarring throughout the lungs but no active process is seen. No effusion is noted. No mediastinal or hilar adenopathy is seen. 3. Some progression of blastic bone metastases throughout the vertebra and ribs.   Electronically Signed   By: Ivar Drape [name].   On: 01/28/2015 13:40    2:59 AM CTA negative for lung mets or PE.  Discussed case with hospitalist, Dr. Hal Hope.  No current explanation as to her hypoxia with ambulation.  There is no noted hypoxia at rest.  Will admit for telemetry observation.  Temp holding orders placed, VS remain stable.  Larene Pickett, PA-C 02/07/15 3570  Everlene Balls, MD 02/07/15 [phone]

## 2015-02-07 NOTE — Progress Notes (Signed)
Report received from Valarie Stewart, RN. No change from initial pm assessment. Will continue to monitor and follow the POC. 

## 2015-02-07 NOTE — ED Notes (Signed)
Pt transported to XRAY °

## 2015-02-08 DIAGNOSIS — F329 Major depressive disorder, single episode, unspecified: Secondary | ICD-10-CM

## 2015-02-08 DIAGNOSIS — T7491XA Unspecified adult maltreatment, confirmed, initial encounter: Secondary | ICD-10-CM

## 2015-02-08 DIAGNOSIS — E119 Type 2 diabetes mellitus without complications: Secondary | ICD-10-CM | POA: Diagnosis not present

## 2015-02-08 DIAGNOSIS — R0609 Other forms of dyspnea: Secondary | ICD-10-CM | POA: Diagnosis not present

## 2015-02-08 DIAGNOSIS — J9601 Acute respiratory failure with hypoxia: Secondary | ICD-10-CM | POA: Diagnosis not present

## 2015-02-08 DIAGNOSIS — F32A Depression, unspecified: Secondary | ICD-10-CM | POA: Diagnosis present

## 2015-02-08 DIAGNOSIS — C50919 Malignant neoplasm of unspecified site of unspecified female breast: Secondary | ICD-10-CM | POA: Diagnosis not present

## 2015-02-08 LAB — CBC
HCT: 31.8 % — ABNORMAL LOW (ref 36.0–46.0)
Hemoglobin: 10.6 g/dL — ABNORMAL LOW (ref 12.0–15.0)
MCH: 35.5 pg — AB (ref 26.0–34.0)
MCHC: 33.3 g/dL (ref 30.0–36.0)
MCV: 106.4 fL — ABNORMAL HIGH (ref 78.0–100.0)
PLATELETS: 129 10*3/uL — AB (ref 150–400)
RBC: 2.99 MIL/uL — ABNORMAL LOW (ref 3.87–5.11)
RDW: 16.5 % — ABNORMAL HIGH (ref 11.5–15.5)
WBC: 8.1 10*3/uL (ref 4.0–10.5)

## 2015-02-08 LAB — GLUCOSE, CAPILLARY
GLUCOSE-CAPILLARY: 150 mg/dL — AB (ref 70–99)
Glucose-Capillary: 129 mg/dL — ABNORMAL HIGH (ref 70–99)
Glucose-Capillary: 155 mg/dL — ABNORMAL HIGH (ref 70–99)
Glucose-Capillary: 136 mg/dL — ABNORMAL HIGH (ref 70–99)

## 2015-02-08 LAB — BASIC METABOLIC PANEL
ANION GAP: 8 (ref 5–15)
BUN: 16 mg/dL (ref 6–23)
CO2: 26 mmol/L (ref 19–32)
Calcium: 9.5 mg/dL (ref 8.4–10.5)
Chloride: 105 mmol/L (ref 96–112)
Creatinine, Ser: 0.95 mg/dL (ref 0.50–1.10)
GFR, EST AFRICAN AMERICAN: 66 mL/min — AB (ref >90)
GFR, EST NON AFRICAN AMERICAN: 57 mL/min — AB (ref >90)
GLUCOSE: 164 mg/dL — AB (ref 70–99)
Potassium: 3.8 mmol/L (ref 3.5–5.1)
Sodium: 139 mmol/L (ref 135–145)

## 2015-02-08 NOTE — Progress Notes (Signed)
Utilization Review completed.  
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
## 2015-02-08 NOTE — Progress Notes (Addendum)
Assessing O2 Saturation  Sat 97 on O2 in bed  Ambulating in hall without O2( off O2 5 minutes) 86 would increased when pt encouraged to purse lip breath   Ambulating in hall with O2 98-100%  Pt felt unsteady. Required assistance to steady her  Will update MD with findings

## 2015-02-08 NOTE — Progress Notes (Addendum)
[place] notified of dc home with [facility] scheduled today. Jonnie Finner RN CCM Case Mgmt phone [phone]

## 2015-02-09 DIAGNOSIS — C50919 Malignant neoplasm of unspecified site of unspecified female breast: Secondary | ICD-10-CM | POA: Diagnosis not present

## 2015-02-09 DIAGNOSIS — J9601 Acute respiratory failure with hypoxia: Secondary | ICD-10-CM | POA: Diagnosis not present

## 2015-02-09 DIAGNOSIS — E119 Type 2 diabetes mellitus without complications: Secondary | ICD-10-CM | POA: Diagnosis not present

## 2015-02-09 DIAGNOSIS — F329 Major depressive disorder, single episode, unspecified: Secondary | ICD-10-CM | POA: Diagnosis not present

## 2015-02-09 DIAGNOSIS — T7491XD Unspecified adult maltreatment, confirmed, subsequent encounter: Secondary | ICD-10-CM

## 2015-02-09 LAB — GLUCOSE, CAPILLARY
GLUCOSE-CAPILLARY: 140 mg/dL — AB (ref 70–99)
GLUCOSE-CAPILLARY: 214 mg/dL — AB (ref 70–99)
Glucose-Capillary: 138 mg/dL — ABNORMAL HIGH (ref 70–99)

## 2015-02-09 MED ORDER — MIRTAZAPINE 15 MG PO TABS: 15.0000 mg | ORAL_TABLET | Freq: Every day | ORAL | Status: DC

## 2015-02-09 MED ORDER — MIRTAZAPINE 15 MG PO TABS
15.0000 mg | ORAL_TABLET | Freq: Every day | ORAL | Status: DC
  Filled 2015-02-09: qty 1
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
## 2015-02-09 NOTE — Discharge Instructions (Signed)
Your respiratory failure is from radiation induced lung damage.  Acute Respiratory Failure Respiratory failure is when your lungs are not working well and your breathing (respiratory) system fails. When respiratory failure occurs, it is difficult for your lungs to get enough oxygen, get rid of carbon dioxide, or both. Respiratory failure can be life threatening.  Respiratory failure can be acute or chronic. Acute respiratory failure is sudden, severe, and requires emergency medical treatment. Chronic respiratory failure is less severe, happens over time, and requires ongoing treatment.  WHAT ARE THE CAUSES OF ACUTE RESPIRATORY FAILURE?  Any problem affecting the heart or lungs can cause acute respiratory failure. Some of these causes include the following:  Chronic bronchitis and emphysema (COPD).   Blood clot going to a lung (pulmonary embolism).   Having water in the lungs caused by heart failure, lung injury, or infection (pulmonary edema).   Collapsed lung (pneumothorax).   Pneumonia.   Pulmonary fibrosis.   Obesity.   Asthma.   Heart failure.   Any type of trauma to the chest that can make breathing difficult.   Nerve or muscle diseases making chest movements difficult. WHAT SYMPTOMS SHOULD YOU WATCH FOR?  If you have any of these signs or symptoms, you should seek immediate medical care:   You have shortness of breath (dyspnea) with or without activity.   You have rapid, fast breathing (tachypnea).   You are wheezing.  You are unable to say more than a few words without having to catch your breath.  You find it very difficult to function normally.  You have a fast heart rate.   You have a bluish color to your finger or toe nail beds.   You have confusion or drowsiness or both.  HOW WILL MY ACUTE RESPIRATORY FAILURE BE TREATED?  Treatment of acute respiratory failure depends on the cause of the respiratory failure. Usually, you will stay in the  intensive care unit so your breathing can be watched closely. Treatment can include the following:  Oxygen. Oxygen can be delivered through the following:  Nasal cannula. This is small tubing that goes in your nose to give you oxygen.  Face mask. A face mask covers your nose and mouth to give you oxygen.  Medicine. Different medicines can be given to help with breathing. These can include:  Nebulizers. Nebulizers deliver medicines to open the air passages (bronchodilators). These medicines help to open or relax the airways in the lungs so you can breathe better. They can also help loosen mucus from your lungs.  Diuretics. Diuretic medicines can help you breathe better by getting rid of extra water in your body.  Steroids. Steroid medicines can help decrease swelling (inflammation) in your lungs.  Antibiotics.  Chest tube. If you have a collapsed lung (pneumothorax), a chest tube is placed to help reinflate the lung.  Non-invasive positive pressure ventilation (NPPV). This is a tight-fitting mask that goes over your nose and mouth. The mask has tubing that is attached to a machine. The machine blows air into the tubing, which helps to keep the tiny air sacs (alveoli) in your lungs open. This machine allows you to breathe on your own.  Ventilator. A ventilator is a breathing machine. When on a ventilator, a breathing tube is put into the lungs. A ventilator is used when you can no longer breathe well enough on your own. You may have low oxygen levels or high carbon dioxide (CO2) levels in your blood. When you are on a ventilator,  sedation and pain medicines are given to make you sleep so your lungs can heal. Document Released: 10/09/2013 Document Revised: 02/18/2014 Document Reviewed: 10/09/2013 [facility] Patient Information 2015 [place], [place]. This information is not intended to replace advice given to you by your health care provider. Make sure you discuss any questions you have with your  health care provider.

## 2015-02-09 NOTE — Progress Notes (Signed)
SATURATION QUALIFICATIONS: (This note is used to comply with regulatory documentation for home oxygen)  Patient Saturations on Room Air at Rest = 87%  Patient Saturations on Room Air while Ambulating = 86%  Patient Saturations on 2 Liters of oxygen while Ambulating = 98%  Please briefly explain why patient needs home oxygen: When at rest and with activity patient O2 stat drops below 88%.  O2 needed to maintain O2 stats
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
## 2015-02-09 NOTE — Progress Notes (Signed)
Pt was discharged from unit with oxygen and pt belongings. Pt left unit via wheelchair, escorted by staff to main entrance to wait for husband to pick pt up.

## 2015-02-11 ENCOUNTER — Telehealth: Payer: Self-pay | Admitting: *Deleted

## 2015-02-11 NOTE — Telephone Encounter (Signed)
Per Dr. Jana Hakim, patient can be seen on 02/21/15 at 2:30 and labs at 2:00. POF sent to scheduling.

## 2015-02-11 NOTE — Telephone Encounter (Signed)
PT. ASKED IF ANYTHING DIFFERENT NEEDS TO BE DONE AT THE MRI SINCE SHE IS NOW ON O2. CALLED [facility] CONCERNING PT. HAVING O2. SHE WILL NEED TO BRING THE O2 WITH HER TO THE MRI. NOTIFIED PT. SHE VOICES [facility] OVER THE WEEKEND. SHE WAS DISCHARGED ON Sunday WITH INSTRUCTIONS TO FOLLOW UP WITH HER PRIMARY CARE PHYSICIAN. PT. CALLED HER PCP'S OFFICE AND WAS TOLD HER PCP WAS OUT OF TOWN. PT. DID NOT WANT TO SEE A PA "BECAUSE SHE IS TOO SICK", INSTEAD PT. WOULD LIKE TO SEE DR.MAGRINAT EARLIER THAN HER 02/26/15 APPOINTMENT. THIS NOTE ROUTED TO DR.MAGRINAT, STACEY CAMP,RN AND VAL DODD,RN.

## 2015-02-12 ENCOUNTER — Ambulatory Visit
Admission: RE | Admit: 2015-02-12 | Discharge: 2015-02-12 | Disposition: A | Discharge status: Home / Self Care | Payer: Commercial Managed Care - HMO | Source: Ambulatory Visit | Attending: Oncology | Admitting: Oncology

## 2015-02-12 DIAGNOSIS — C50919 Malignant neoplasm of unspecified site of unspecified female breast: Secondary | ICD-10-CM

## 2015-02-12 MED ORDER — GADOBENATE DIMEGLUMINE 529 MG/ML IV SOLN
13.0000 mL | Freq: Once | INTRAVENOUS | Status: AC | PRN
  Administered 2015-02-12: 13 mL via INTRAVENOUS

## 2015-02-17 ENCOUNTER — Telehealth: Payer: Self-pay

## 2015-02-17 NOTE — Telephone Encounter (Addendum)
Pt requesting suggestion for a referral for her dizzyness. She states "I know Dr Jana Hakim does not doctor head issues".  Her PCP is out of town and she refuses to see a PA. She was placed on home O2 since her hospitalization. One spell since been on O2, wasn't as bad. When she is dizzy she will get vomiting and diarrhea and cannot focus on the clock, cannot walk straight. Antivert seems to make attacks worse. She had similar episodes before her hospitalization, and it put her in the hospital. Second issue she mentioned is aching pain in her neck as well since before her hospitalization, they suggested ice which she has not used. Pain with moving head, uses tylenol.  She does not use percocet even though it is on her med list.

## 2015-02-18 ENCOUNTER — Telehealth: Payer: Self-pay

## 2015-02-18 DIAGNOSIS — C50919 Malignant neoplasm of unspecified site of unspecified female breast: Secondary | ICD-10-CM

## 2015-02-18 MED ORDER — PREDNISONE 5 MG PO TABS: 5.0000 mg | ORAL_TABLET | Freq: Every day | ORAL | Status: DC

## 2015-02-18 NOTE — Telephone Encounter (Signed)
Try prednisone 5 mg po Q AM x 10 days

## 2015-02-18 NOTE — Telephone Encounter (Signed)
Per Dr. Jana Hakim, let pt know she has appt Fri - Dr. Jana Hakim can see her then.  Pt says she will try - "taking things one day at a time".  Pt reports she will be here for NM tomorrow.  Advised patient to go to ED for worsening symptoms.  Pt voiced understanding. m

## 2015-02-18 NOTE — Telephone Encounter (Signed)
S/w pt that Dr Jana Hakim Rx prednisone 5 mg daily X10 days. [facility]. Talked about monitoring blood sugar while on prednisone.

## 2015-02-19 ENCOUNTER — Encounter (HOSPITAL_COMMUNITY): Payer: Commercial Managed Care - HMO

## 2015-02-19 ENCOUNTER — Encounter (HOSPITAL_COMMUNITY)
Admission: RE | Admit: 2015-02-19 | Discharge: 2015-02-19 | Disposition: A | Discharge status: Home / Self Care | Payer: Commercial Managed Care - HMO | Source: Ambulatory Visit | Attending: Oncology | Admitting: Oncology

## 2015-02-19 ENCOUNTER — Ambulatory Visit (HOSPITAL_COMMUNITY)
Admission: RE | Admit: 2015-02-19 | Discharge: 2015-02-19 | Disposition: A | Discharge status: Home / Self Care | Payer: Commercial Managed Care - HMO | Source: Ambulatory Visit | Attending: Oncology | Admitting: Oncology

## 2015-02-19 DIAGNOSIS — Z08 Encounter for follow-up examination after completed treatment for malignant neoplasm: Secondary | ICD-10-CM | POA: Insufficient documentation

## 2015-02-19 DIAGNOSIS — E119 Type 2 diabetes mellitus without complications: Secondary | ICD-10-CM

## 2015-02-19 DIAGNOSIS — C7951 Secondary malignant neoplasm of bone: Secondary | ICD-10-CM | POA: Diagnosis not present

## 2015-02-19 DIAGNOSIS — Z9221 Personal history of antineoplastic chemotherapy: Secondary | ICD-10-CM | POA: Diagnosis not present

## 2015-02-19 DIAGNOSIS — C50919 Malignant neoplasm of unspecified site of unspecified female breast: Secondary | ICD-10-CM | POA: Diagnosis not present

## 2015-02-19 DIAGNOSIS — Z923 Personal history of irradiation: Secondary | ICD-10-CM | POA: Insufficient documentation

## 2015-02-19 DIAGNOSIS — I10 Essential (primary) hypertension: Secondary | ICD-10-CM

## 2015-02-19 MED ORDER — TECHNETIUM TC 99M MEDRONATE IV KIT
26.2000 | PACK | Freq: Once | INTRAVENOUS | Status: AC | PRN
  Administered 2015-02-19: 26.2 via INTRAVENOUS

## 2015-02-21 ENCOUNTER — Telehealth: Payer: Self-pay | Admitting: Oncology

## 2015-02-21 ENCOUNTER — Other Ambulatory Visit (HOSPITAL_BASED_OUTPATIENT_CLINIC_OR_DEPARTMENT_OTHER): Payer: Commercial Managed Care - HMO

## 2015-02-21 ENCOUNTER — Ambulatory Visit (HOSPITAL_BASED_OUTPATIENT_CLINIC_OR_DEPARTMENT_OTHER): Payer: Commercial Managed Care - HMO | Admitting: Oncology

## 2015-02-21 VITALS — BP 152/67 | HR 73 | Temp 98.3°F | Resp 18 | Ht 64.0 in | Wt 140.4 lb

## 2015-02-21 DIAGNOSIS — C50919 Malignant neoplasm of unspecified site of unspecified female breast: Secondary | ICD-10-CM

## 2015-02-21 DIAGNOSIS — C50212 Malignant neoplasm of upper-inner quadrant of left female breast: Secondary | ICD-10-CM

## 2015-02-21 DIAGNOSIS — C7951 Secondary malignant neoplasm of bone: Secondary | ICD-10-CM

## 2015-02-21 DIAGNOSIS — E119 Type 2 diabetes mellitus without complications: Secondary | ICD-10-CM

## 2015-02-21 DIAGNOSIS — F329 Major depressive disorder, single episode, unspecified: Secondary | ICD-10-CM | POA: Diagnosis not present

## 2015-02-21 DIAGNOSIS — I10 Essential (primary) hypertension: Secondary | ICD-10-CM

## 2015-02-21 DIAGNOSIS — J4521 Mild intermittent asthma with (acute) exacerbation: Secondary | ICD-10-CM

## 2015-02-21 DIAGNOSIS — F32A Depression, unspecified: Secondary | ICD-10-CM

## 2015-02-21 LAB — CBC WITH DIFFERENTIAL/PLATELET
BASO%: 0.2 % (ref 0.0–2.0)
Basophils Absolute: 0 10*3/uL (ref 0.0–0.1)
EOS ABS: 0.1 10*3/uL (ref 0.0–0.5)
EOS%: 0.6 % (ref 0.0–7.0)
HCT: 32.4 % — ABNORMAL LOW (ref 34.8–46.6)
HGB: 11 g/dL — ABNORMAL LOW (ref 11.6–15.9)
LYMPH%: 45 % (ref 14.0–49.7)
MCH: 35.4 pg — ABNORMAL HIGH (ref 25.1–34.0)
MCHC: 34 g/dL (ref 31.5–36.0)
MCV: 104.2 fL — ABNORMAL HIGH (ref 79.5–101.0)
MONO#: 1.2 10*3/uL — AB (ref 0.1–0.9)
MONO%: 14.2 % — ABNORMAL HIGH (ref 0.0–14.0)
NEUT%: 40 % (ref 38.4–76.8)
NEUTROS ABS: 3.4 10*3/uL (ref 1.5–6.5)
PLATELETS: 134 10*3/uL — AB (ref 145–400)
RBC: 3.11 10*6/uL — ABNORMAL LOW (ref 3.70–5.45)
RDW: 16.2 % — AB (ref 11.2–14.5)
WBC: 8.5 10*3/uL (ref 3.9–10.3)
lymph#: 3.8 10*3/uL — ABNORMAL HIGH (ref 0.9–3.3)
nRBC: 1 % — ABNORMAL HIGH (ref 0–0)

## 2015-02-21 LAB — COMPREHENSIVE METABOLIC PANEL (CC13)
ALT: 37 U/L (ref 0–55)
ANION GAP: 14 meq/L — AB (ref 3–11)
AST: 78 U/L — ABNORMAL HIGH (ref 5–34)
Albumin: 3.7 g/dL (ref 3.5–5.0)
Alkaline Phosphatase: 217 U/L — ABNORMAL HIGH (ref 40–150)
BILIRUBIN TOTAL: 0.55 mg/dL (ref 0.20–1.20)
BUN: 12 mg/dL (ref 7.0–26.0)
CALCIUM: 9.8 mg/dL (ref 8.4–10.4)
CO2: 23 meq/L (ref 22–29)
CREATININE: 0.7 mg/dL (ref 0.6–1.1)
Chloride: 106 mEq/L (ref 98–109)
EGFR: 78 mL/min/{1.73_m2} — AB (ref >90)
Glucose: 155 mg/dl — ABNORMAL HIGH (ref 70–140)
Potassium: 3.5 mEq/L (ref 3.5–5.1)
Sodium: 143 mEq/L (ref 136–145)
Total Protein: 7.2 g/dL (ref 6.4–8.3)

## 2015-02-21 LAB — TECHNOLOGIST REVIEW

## 2015-02-21 MED ORDER — EXEMESTANE 25 MG PO TABS: 25.0000 mg | ORAL_TABLET | Freq: Every day | ORAL | Status: DC

## 2015-02-21 NOTE — Telephone Encounter (Signed)
Appointments made and avs printed for patient °

## 2015-02-21 NOTE — Progress Notes (Signed)
ID: Jamie [name]   DOB: 04-03-1936  MR#: [id number]  AYT#:[id number]  PCP: [name], MD GYN: Everlene Farrier, MD SU: OTHER MD: Renato Shin, MD;  Delfin Edis, MD, REula Listen DDS, Allena Katz [name].  CHIEF COMPLAINT:  Metastatic Breast Cancer  CURRENT THERAPY: Exemestane, zolendronate; everolimus to be added   BREAST CANCER HISTORY: From the original intake note:  The patient's cancer was uncovered by an unusually circuitous route. She presented with stomach upset.  Because of a history of colitis, Dr. Teena Irani who happened to be on call for Dr. Cristina Gong, obtained CT of the abdomen and pelvis on August 30, 2005.  As far as the abdomen and pelvis were concerned, there was no evidence of colitis but there were multiple scattered sclerotic lesions in the bone suggesting possible sclerotic metastatic disease (versus osteopoikilosis.)  Accordingly a bone scan was obtained.  This was done on September 01, 2005, and found tiny sclerotic lesions, and in particular a lesion of concern in the sternum.  Accordingly a CT scan of the chest was obtained.  This found the sternal problem to correspond with degenerative changes. However, it also showed a soft tissue nodule in the left breast.  The patient then had mammograms on September 27, 2005.  This found two areas of spiculation and architectural distortion in the upper outer quadrant of the left breast.  By ultrasound these were hyperechoic and irregular, highly suspicious for carcinoma.  Biopsy was obtained the same day and showed ([id number], as well as [id number]) invasive lobular carcinoma, with both lesions biopsied being ER and PR positive, both herceptest positive, both negative by FISH.  Accordingly the patient was referred to Dr. Georgette Dover and breast MRI was obtained October 06, 2006.  No abnormal enhancement was noted in the right breast.  There was enhancing nodularity in the upper portion of the left breast where several discrete nodules  were seen.  Accordingly Dr. Georgette Dover performed a left modified radical mastectomy on November 02, 2005.  The patient had a positive sentinel lymph node and Dr. Georgette Dover performed a left axillary lymph node dissection.  However, only three additional lymph nodes were recovered, two of which also showed metastatic involvement (all had extra capsular extension.)    The patient's subsequent history is as detailed below  INTERVAL HISTORY: "Jamie [name]" returns today for followup of her metastatic breast carcinoma accompanied by her husband Laverna Peace. The interval history is significant for having been evaluated in the emergency room and briefly admitted late April. In the emergency room she was found to be hypoxic after walking just a few steps (82% sat). CT NGO was negative. She was discharged on home oxygen, which she is wearing today. Since her last visit here also she had a bone scan 02/19/2015. Although it is always difficult to assess this, there are some increase in activity but no obvious new spots. In the meantime her CA-27-29 has significantly increased.  REVIEW OF SYSTEMS: Jamie [name] feels "terrible". She can't get anything done at home because she is so tired. She has pain on the left side of her head, and is very forgetful. Her husband breaks in to tell me that she is demented. However she knows her address, phone number, and what she had for lunch today. She hurts "everywhere". She continues to be short of breath, and has a nonproductive cough. She has heartburn. Her appetite is poor. She tells me today she never started her Remeron. She admits to anxiety and depression. She thinks  her diabetes is well controlled. A detailed review of systems today was otherwise stable  PAST MEDICAL HISTORY: Past Medical History  Diagnosis Date  . Anxiety   . Asthma   . Breast cancer     metastasized  . Depression   . Diabetes mellitus   . GERD (gastroesophageal reflux disease)   . Hyperlipidemia   . Internal hemorrhoids   .  IBS (irritable bowel syndrome)   . Mitral valve prolapse   . Ischemic colitis   . Fibromyalgia   . Allergy   . Cataract   . S/P radiation therapy within four to twelve weeks 04/26/13-05/11/13    L-spine/sacrum 30Gy/27fx  . Colon polyp 31-Dec-2011    TUBULAR ADENOMA  Significant for diabetes, hypercholesterolemia, asthma, mitral valve prolapse requiring antibiotic prophylaxis before any invasive procedures, Meniere's disease, gastroesophageal reflux disease, osteoarthritis, degenerative disk disease, history of fibromyalgia, panic attacks, history of synovial cyst removal, history of total hysterectomy with bilateral salpingo-oophorectomy, history of septoplasty x2, status post appendectomy, status post dilatation and curettage remotely, status post bilateral carpal tunnel release under Dr. Clair Gulling [place].  PAST SURGICAL HISTORY: Past Surgical History  Procedure Laterality Date  . Mastectomy Left     then treated with chemo and radiation  . Total abdominal hysterectomy    . Back surgery      synovial cyst removed from spine  . Carpal tunnel release Bilateral   . Dilation and curettage of uterus    . Appendectomy    . Cataract extraction Bilateral   . Colonoscopy w/ biopsies  Dec 31, 2011  . Esophagogastroduodenoscopy  12/31/2006    normal    FAMILY HISTORY The patient's father died at the age of 86 from a stroke.  The patient's mother died at the age of 14, also from a stroke.  The patient has three brothers; one died with "liver disease."  One has prostate cancer.  The other brother has prostate and colon cancer.  The only breast cancer in the family was the patient's mother's mother and she does not know how old her grandmother was when she was diagnosed.  There is no history of ovarian cancer in the family.  GYNECOLOGIC HISTORY: She is GXP2.  She had her hysterectomy while still menstruating and she took hormone replacement until December 2006, when she had her breast biopsy.  SOCIAL HISTORY: (Updated  02/01/2014) She used to work as a [employer]ar[employer] manager.  Her husband Laverna Peace (who is the son of my former patient Suhana Wilner) is semiretired, occasionally working for a Forensic scientist.  What he likes to do is hunt and fish.  Their son Chrissie Noa had significant muscular dystrophy and died in December 31, 2006. Their daughter Marcie Bal lives in [place] and works in an office.  The patient has two granddaughters, both RNs, one working at [employer] and the other in [place].    ADVANCED DIRECTIVES: Not in place  HEALTH MAINTENANCE:  (Updated 02/01/2014) History  Substance Use Topics  . Smoking status: Never Smoker   . Smokeless tobacco: Never Used  . Alcohol Use: No     Colonoscopy:  July 2013, Dr. Olevia Perches  PAP: s/p hysterectomy  Bone density:  November 2014 at [facility], normal  Lipid panel: Dr. Virgina Jock    Allergies  Allergen Reactions  . Hydrocodone Anxiety    Pt. Said she is fine with this medication  . Metformin And Related     GI sxs  . Morphine And Related   . Niacin Other (See Comments)  Dizziness   . Promethazine Hcl Other (See Comments)    Dizziness "FELT CRAZY"     Current Outpatient Prescriptions  Medication Sig Dispense Refill  . ACCU-CHEK SMARTVIEW test strip   11  . acetaminophen (TYLENOL) 500 MG tablet Take 500 mg by mouth every 6 (six) hours as needed for mild pain or moderate pain (pain).     . ALREX 0.2 % SUSP Place 1 drop into both eyes daily.   0  . BYSTOLIC 5 MG tablet Take 1 tablet by mouth Daily.    . Calcium Carbonate (CALCIUM 600 PO) Take 1 tablet by mouth daily.    . Cholecalciferol (VITAMIN D) 1000 UNITS capsule Take 1,000 Units by mouth 2 (two) times daily.     . clonazePAM (KLONOPIN) 0.5 MG tablet Take 0.5 mg by mouth 2 (two) times daily.     . CRESTOR 5 MG tablet Take 1 tablet by mouth Daily.    Marland Kitchen esomeprazole (NEXIUM) 40 MG capsule Take 1 capsule (40 mg total) by mouth daily. 90 capsule 3  . fluconazole (DIFLUCAN) 150 MG tablet Take 150 mg by mouth daily.  Take 1 Tablet Every 10 Days.    Marland Kitchen glimepiride (AMARYL) 1 MG tablet Take 1 mg by mouth daily.     Marland Kitchen GLYCERIN ADULT 2 G suppository Place 1 suppository rectally daily as needed for mild constipation (constipation).     . hydrochlorothiazide (HYDRODIURIL) 25 MG tablet Take 12.5 mg by mouth every Monday, Wednesday, and Friday.     . hydrocortisone (ANUSOL-HC) 2.5 % rectal cream Place 1 application rectally daily as needed for hemorrhoids or itching.     . letrozole (FEMARA) 2.5 MG tablet TAKE 1 TABLET (2.5 MG TOTAL) BY MOUTH DAILY. 90 tablet 4  . lidocaine-hydrocortisone (ANAMANTEL HC) 3-0.5 % CREA Place 1 Applicatorful rectally 2 (two) times daily as needed for hemorrhoids and bleeding. 28.35 g 0  . meclizine (ANTIVERT) 25 MG tablet Take 25 mg by mouth 2 (two) times daily as needed for dizziness (dizziness).     . mirtazapine (REMERON) 15 MG tablet Take 1 tablet (15 mg total) by mouth at bedtime. 30 tablet 1  . ondansetron (ZOFRAN) 4 MG tablet Take 1 tablet (4 mg total) by mouth every 6 (six) hours. 12 tablet 0  . ONGLYZA 5 MG TABS tablet Take 5 mg by mouth 2 (two) times daily.     Marland Kitchen oxyCODONE-acetaminophen (PERCOCET/ROXICET) 5-325 MG per tablet Take 1 tablet by mouth every 6 (six) hours as needed for severe pain. (Patient not taking: Reported on 02/06/2015) 60 tablet 0  . Polyethyl Glycol-Propyl Glycol (SYSTANE OP) Apply 1-2 drops to eye daily as needed (dry eyes).     . predniSONE (DELTASONE) 5 MG tablet Take 1 tablet (5 mg total) by mouth daily with breakfast. For 10 days 10 tablet 0  . PROAIR HFA 108 (90 BASE) MCG/ACT inhaler Inhale 1-2 puffs into the lungs every 6 (six) hours as needed for wheezing or shortness of breath (wheezing).     Marland Kitchen QVAR 80 MCG/ACT inhaler Inhale 2 puffs into the lungs 2 (two) times daily.     . sucralfate (CARAFATE) 1 G tablet Take 1 g by mouth 4 (four) times daily as needed.     . triamterene-hydrochlorothiazide (MAXZIDE) 75-50 MG per tablet Take 0.5 tablets by mouth 3  (three) times a week.   2   No current facility-administered medications for this visit.    OBJECTIVE: Middle-aged white woman who carry a portable oxygen  tank and wearing a nasal cannula Filed Vitals:   02/21/15 1417  BP: 152/67  Pulse: 73  Temp: 98.3 F (36.8 C)  Resp: 18     Body mass index is 24.09 kg/(m^2).    ECOG FS: 2 Filed Weights   02/21/15 1417  Weight: 140 lb 6.4 oz (63.685 kg)    Sclerae unicteric, EOMs intact, pupils reactive and round Oropharynx clear , slightly dry No cervical or supraclavicular adenopathy Lungs no rales or rhonchi, no wheezes appreciated Heart regular rate and rhythm Abd soft, nontender, positive bowel sounds MSK no focal spinal tenderness Neuro: Nonfocal, well-oriented, depressed ffect Breasts: The right breast is unremarkable. The left breast is status post mastectomy and radiation. There is no evidence of chest wall recurrence. The left axilla is benign.   LAB RESULTS: Lab Results  Component Value Date   WBC 8.5 02/21/2015   NEUTROABS 3.4 02/21/2015   HGB 11.0* 02/21/2015   HCT 32.4* 02/21/2015   MCV 104.2* 02/21/2015   PLT 134* 02/21/2015      Chemistry      Component Value Date/Time   NA 143 02/21/2015 1347   NA 139 02/08/2015 0425   K 3.5 02/21/2015 1347   K 3.8 02/08/2015 0425   CL 105 02/08/2015 0425   CL 103 03/22/2013 0754   CO2 23 02/21/2015 1347   CO2 26 02/08/2015 0425   BUN 12.0 02/21/2015 1347   BUN 16 02/08/2015 0425   CREATININE 0.7 02/21/2015 1347   CREATININE 0.95 02/08/2015 0425      Component Value Date/Time   CALCIUM 9.8 02/21/2015 1347   CALCIUM 9.5 02/08/2015 0425   ALKPHOS 217* 02/21/2015 1347   ALKPHOS 162* 02/06/2015 2204   AST 78* 02/21/2015 1347   AST 80* 02/06/2015 2204   ALT 37 02/21/2015 1347   ALT 36* 02/06/2015 2204   BILITOT 0.55 02/21/2015 1347   BILITOT 0.9 02/06/2015 2204      Ref. Range 05/23/2014 11:32 08/15/2014 10:39 11/21/2014 12:31 01/21/2015 09:47 02/21/2015 13:47  CA 27.29  Latest Ref Range: 0-39 U/mL 115 (H) 208 (H) 436 (H) 780 (H) 930 (H)    STUDIES: CLINICAL DATA: Nonproductive cough for the past several months, nonsmoker, history of BIRT breast malignancy with metastatic disease to the bone  EXAM: CHEST 2 VIEW  COMPARISON: Chest x-ray of August 31, 2014  FINDINGS: The lungs are well-expanded. There is no focal infiltrate. No pulmonary parenchymal masses are demonstrated. The heart and pulmonary vascularity are normal. The mediastinum is normal in width. There is no pleural effusion or pneumothorax. Multiple sclerotic metastatic foci are noted within the vertebral bodies. There is no compression fracture.  IMPRESSION: There is no radiographic evidence of active cardiopulmonary disease. However, given the patient's persistent cough and known metastatic breast malignancy to bone, chest CT scanning is recommended.   Electronically Signed  By: David Martinique  On: 12/05/2014 11:14 CLINICAL DATA: Elevated LFTs. Severe nausea. Symptoms with eating. Epigastric and left lower quadrant pain for 2 months. Metastatic breast cancer.  EXAM: ULTRASOUND ABDOMEN COMPLETE  COMPARISON: CT of the abdomen and pelvis on 11/16/2014  FINDINGS: Gallbladder: Gallbladder has a normal appearance. Gallbladder wall is 1.9 mm, within normal limits. No stones or pericholecystic fluid. No sonographic Murphy's sign.  Common bile duct: Diameter: 3.3 mm  Liver: No focal lesion identified. Within normal limits in parenchymal echogenicity.  IVC: No abnormality visualized.  Pancreas: Visualized portion unremarkable.  Spleen: Size and appearance within normal limits.  Right Kidney: Length: 10.8 cm.  Echogenicity within normal limits. No mass visualized. Extrarenal pelvis noted.  Left Kidney: Length: 9.9 cm. Echogenicity within normal limits. No mass or hydronephrosis visualized.  Abdominal aorta: No aneurysm visualized.  Other  findings: None.  IMPRESSION: 1. No evidence for acute cholecystitis. 2. No evidence for metastatic lesions of the abdomen.   Electronically Signed  By: Nolon Nations [name].  On: 12/05/2014 09:43    ASSESSMENT:78 y.o.  [place] woman    (1) status post left modified radical mastectomy in January 2007 for a T1c N1, stage IIA  invasive lobular carcinoma, grade 1, strongly estrogen and progesterone receptor positive, HER-2 negative, with an MIB-1 of 7%.  (2)  Status post radiation given concurrently with Capecitabine.  Declined chemotherapy.    (3) Status post anastrozole and exemestane, both discontinued due to aches and pains.  Status post tamoxifen between October 2007 until August 2011, discontinued secondary to cramps   (4) multiple sclerotic bony lesions noted at initial diagnosis, biopsied x2 August 2009 without malignancy documented, on zoledronic acid annually starting 12/12/2007. Increased to monthly starting 07/17/2013  (5) On fulvestrant between September 2011 and 05/17/2013 with likely progression of bony disease  (6)  radiation to the lumbosacral spine and right hip to treat right hip pain to 30 gray completed 87/19/5974, complicated by [possible] radiation induced colitis  (7)  on letrozole since September 2014; discontinued May 2016 with progression  (8) received zoledronic acid monthly basis beginning September 2014, after May 2015 switched to denosumab, switched back to zolendronate 05/23/2014 to be given every 12 weeks (most recent dose 02/04//2016)  PLAN:  Jamie [name] is progressing despite the letrozole. As always it is difficult to assess the patient's course when there is bone only disease, but the recent scans plus is deeply rising CA-27-29 are persuasive.  I would like to treat her with exemestane and everolimus. We're going to start the exemestane now. She has a good understanding of the possible toxicities, side effects and complications, which are really  not that different from letrozole. We will then obtain the everolimus for her before she returns to see me later this month. The concerned they are of course is that it can cause pneumonitis.  I think it would be helpful for her to have a pulmonologist and I am referring her to Dr. Chase Caller for further evaluation. I'm also going to obtain an MRI of her brain and liver given the increase in liver function tests and some of the strange headache/dizziness symptoms she is having. Of course she has a history of Mnire's disease which also complicates evaluation.  Jamie [name] has a good understanding of the overall plan. She agrees with it. She knows the goal of treatment in her case is control. She will call with any problems that may develop before her next visit here.  Chauncey Cruel, MD     02/21/2015

## 2015-02-22 LAB — CANCER ANTIGEN 27.29: CA 27.29: 930 U/mL — AB (ref 0–39)

## 2015-02-24 ENCOUNTER — Telehealth: Payer: Self-pay

## 2015-02-24 NOTE — Telephone Encounter (Signed)
[facility] with [facility] called stating pt was on o2 at 2 lpm and was walking outside. She desated to 81%. Amy was asking for order to increase O2 while ambulating. Also requesting an order for supplemental O2 so she could get a larger size tank. So she does not run out of O2 when going to doctors etc. A larger tank on wheels.

## 2015-02-24 NOTE — Telephone Encounter (Signed)
Amy with [facility] also mentioned that pt does not understand her cancer and where it has spread. Pt had a scan last week. Amy was not sure if pt was forgetful or not understanding.

## 2015-02-24 NOTE — Telephone Encounter (Signed)
This RN spoke with Amy with [facility]-   Pt may increase O2 to 3/L/min with ambulation and then resume 2L/min at rest.  This RN discussed with Amy pt's diagnosis and current areas of known disease, also discussed issues relating to pt's psychosocial situation which may be attributing to pt's current mental status.  Amy states she encouraged the pt to keep all scheduled scan appointments so appropriate work up and plan of care can be obtained.

## 2015-02-25 ENCOUNTER — Telehealth: Payer: Self-pay | Admitting: *Deleted

## 2015-02-25 NOTE — Telephone Encounter (Signed)
Patient would like a different oxygen tank that is portable. Message sent to MD and RN.

## 2015-02-26 ENCOUNTER — Encounter: Payer: Self-pay | Admitting: General Practice

## 2015-02-26 ENCOUNTER — Other Ambulatory Visit: Payer: Commercial Managed Care - HMO

## 2015-02-26 ENCOUNTER — Ambulatory Visit (HOSPITAL_BASED_OUTPATIENT_CLINIC_OR_DEPARTMENT_OTHER): Payer: Commercial Managed Care - HMO

## 2015-02-26 ENCOUNTER — Ambulatory Visit: Payer: Commercial Managed Care - HMO | Admitting: Oncology

## 2015-02-26 DIAGNOSIS — M545 Low back pain, unspecified: Secondary | ICD-10-CM

## 2015-02-26 DIAGNOSIS — C7951 Secondary malignant neoplasm of bone: Secondary | ICD-10-CM | POA: Diagnosis not present

## 2015-02-26 DIAGNOSIS — M25551 Pain in right hip: Secondary | ICD-10-CM

## 2015-02-26 DIAGNOSIS — C50919 Malignant neoplasm of unspecified site of unspecified female breast: Secondary | ICD-10-CM

## 2015-02-26 DIAGNOSIS — C50412 Malignant neoplasm of upper-outer quadrant of left female breast: Secondary | ICD-10-CM

## 2015-02-26 DIAGNOSIS — C50212 Malignant neoplasm of upper-inner quadrant of left female breast: Secondary | ICD-10-CM | POA: Diagnosis not present

## 2015-02-26 DIAGNOSIS — R0781 Pleurodynia: Secondary | ICD-10-CM

## 2015-02-26 MED ORDER — ZOLEDRONIC ACID 4 MG/100ML IV SOLN
4.0000 mg | Freq: Once | INTRAVENOUS | Status: AC
Start: 2015-02-26 — End: 2015-02-26
  Administered 2015-02-26: 4 mg via INTRAVENOUS
  Filled 2015-02-26: qty 100

## 2015-02-26 NOTE — Patient Instructions (Signed)
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
## 2015-02-27 ENCOUNTER — Other Ambulatory Visit: Payer: Self-pay | Admitting: Oncology

## 2015-02-27 NOTE — Telephone Encounter (Signed)
Pt should request this from her PCP as we do not have expertise in this area. Thank you.

## 2015-02-28 ENCOUNTER — Encounter: Payer: Self-pay | Admitting: *Deleted
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
## 2015-03-12 ENCOUNTER — Telehealth: Payer: Self-pay

## 2015-03-12 NOTE — Telephone Encounter (Signed)
Jamie [name] with [facility] called requesting we call [employer] for humidification on her oxygen at home.  [facility] is finishing their services to the pt and suggested we do hospice consult to see if pt is appropriate. An example is the pt needs help with her med box. Per prior note about an oxygen request, I referred Jamie [name] to call the pt's PCP and she agreed. Val will talk with Dr Jana Hakim about a hospice referral.

## 2015-03-14 ENCOUNTER — Telehealth: Payer: Self-pay | Admitting: Oncology

## 2015-03-14 ENCOUNTER — Encounter: Payer: Self-pay | Admitting: Oncology

## 2015-03-14 ENCOUNTER — Encounter: Payer: Self-pay | Admitting: Emergency Medicine

## 2015-03-14 ENCOUNTER — Ambulatory Visit
Admission: RE | Admit: 2015-03-14 | Discharge: 2015-03-14 | Disposition: A | Discharge status: Home / Self Care | Payer: Commercial Managed Care - HMO | Source: Ambulatory Visit | Attending: Oncology | Admitting: Oncology

## 2015-03-14 ENCOUNTER — Encounter: Payer: Self-pay | Admitting: Nurse Practitioner

## 2015-03-14 ENCOUNTER — Encounter: Payer: Self-pay | Admitting: *Deleted

## 2015-03-14 ENCOUNTER — Ambulatory Visit (HOSPITAL_BASED_OUTPATIENT_CLINIC_OR_DEPARTMENT_OTHER): Payer: Commercial Managed Care - HMO | Admitting: Nurse Practitioner

## 2015-03-14 ENCOUNTER — Ambulatory Visit (INDEPENDENT_AMBULATORY_CARE_PROVIDER_SITE_OTHER): Payer: Commercial Managed Care - HMO | Admitting: Emergency Medicine

## 2015-03-14 VITALS — BP 124/64 | HR 78 | Ht 64.0 in | Wt 141.0 lb

## 2015-03-14 VITALS — BP 108/77 | HR 86 | Temp 97.9°F | Resp 18 | Ht 64.0 in | Wt 140.9 lb

## 2015-03-14 DIAGNOSIS — J4520 Mild intermittent asthma, uncomplicated: Secondary | ICD-10-CM

## 2015-03-14 DIAGNOSIS — F329 Major depressive disorder, single episode, unspecified: Secondary | ICD-10-CM | POA: Diagnosis not present

## 2015-03-14 DIAGNOSIS — R0602 Shortness of breath: Secondary | ICD-10-CM | POA: Diagnosis not present

## 2015-03-14 DIAGNOSIS — C50919 Malignant neoplasm of unspecified site of unspecified female breast: Secondary | ICD-10-CM

## 2015-03-14 DIAGNOSIS — R05 Cough: Secondary | ICD-10-CM | POA: Diagnosis not present

## 2015-03-14 DIAGNOSIS — C7951 Secondary malignant neoplasm of bone: Secondary | ICD-10-CM

## 2015-03-14 DIAGNOSIS — C50912 Malignant neoplasm of unspecified site of left female breast: Secondary | ICD-10-CM | POA: Diagnosis not present

## 2015-03-14 DIAGNOSIS — J9601 Acute respiratory failure with hypoxia: Secondary | ICD-10-CM

## 2015-03-14 DIAGNOSIS — J700 Acute pulmonary manifestations due to radiation: Secondary | ICD-10-CM

## 2015-03-14 DIAGNOSIS — Z79811 Long term (current) use of aromatase inhibitors: Secondary | ICD-10-CM

## 2015-03-14 DIAGNOSIS — R059 Cough, unspecified: Secondary | ICD-10-CM

## 2015-03-14 MED ORDER — GADOBENATE DIMEGLUMINE 529 MG/ML IV SOLN
12.0000 mL | Freq: Once | INTRAVENOUS | Status: AC | PRN
  Administered 2015-03-14: 12 mL via INTRAVENOUS

## 2015-03-14 MED ORDER — EVEROLIMUS 10 MG PO TABS: 10.0000 mg | ORAL_TABLET | Freq: Every day | ORAL | Status: AC

## 2015-03-14 NOTE — Progress Notes (Signed)
Per [employer] afinitor 10mg  tablet approved thru 09/10/15. I sent to medical records.
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
## 2015-03-14 NOTE — Assessment & Plan Note (Signed)
We will continue 2 L/m. She would like to change oxygen companies to [employer]. I have asked them to get her the most portable system possible

## 2015-03-14 NOTE — Telephone Encounter (Signed)
Left message to confirm appointment for June. Mailed calendar. °

## 2015-03-14 NOTE — Assessment & Plan Note (Signed)
There is interstitial disease present and I suspect that this was the cause of her hypoxemia and her exertional dyspnea. She is improved with oxygen. There is some associated groundglass and is not clear to me whether there is any active disease or inflammation present-the absence of a response to prednisone argues against this. I'll likely need to reimage her in the coming months to ensure stability

## 2015-03-14 NOTE — Progress Notes (Signed)
ID: [name]   DOB: 1936/03/24  MR#: [id number]  KYH#:[id number]  PCP: [name], MD GYN: Everlene Farrier, MD SU: OTHER MD: Renato Shin, MD;  Delfin Edis, MD, Janna Arch DDS, Allena Katz [name].  CHIEF COMPLAINT:  Metastatic Breast Cancer  CURRENT THERAPY: Exemestane, zolendronate; everolimus to be added   BREAST CANCER HISTORY: From the original intake note:  The patient's cancer was uncovered by an unusually circuitous route. She presented with stomach upset.  Because of a history of colitis, Dr. Teena Irani who happened to be on call for Dr. Cristina Gong, obtained CT of the abdomen and pelvis on August 30, 2005.  As far as the abdomen and pelvis were concerned, there was no evidence of colitis but there were multiple scattered sclerotic lesions in the bone suggesting possible sclerotic metastatic disease (versus osteopoikilosis.)  Accordingly a bone scan was obtained.  This was done on September 01, 2005, and found tiny sclerotic lesions, and in particular a lesion of concern in the sternum.  Accordingly a CT scan of the chest was obtained.  This found the sternal problem to correspond with degenerative changes. However, it also showed a soft tissue nodule in the left breast.  The patient then had mammograms on September 27, 2005.  This found two areas of spiculation and architectural distortion in the upper outer quadrant of the left breast.  By ultrasound these were hyperechoic and irregular, highly suspicious for carcinoma.  Biopsy was obtained the same day and showed ([id number], as well as [phone]) invasive lobular carcinoma, with both lesions biopsied being ER and PR positive, both herceptest positive, both negative by FISH.  Accordingly the patient was referred to Dr. Georgette Dover and breast MRI was obtained October 06, 2006.  No abnormal enhancement was noted in the right breast.  There was enhancing nodularity in the upper portion of the left breast where several discrete nodules  were seen.  Accordingly Dr. Georgette Dover performed a left modified radical mastectomy on November 02, 2005.  The patient had a positive sentinel lymph node and Dr. Georgette Dover performed a left axillary lymph node dissection.  However, only three additional lymph nodes were recovered, two of which also showed metastatic involvement (all had extra capsular extension.)    The patient's subsequent history is as detailed below  INTERVAL HISTORY: "Jamie [name]" returns today for followup of her metastatic breast carcinoma, alone. At her last visit she was started on exemestane, and is tolerating this well with no complaints. She denies hot flashes, or vaginal changes. The aches and pains focused on the left neck, shoulder, and flank are no worse.   REVIEW OF SYSTEMS: Jamie [name] continues on home O2 and is annoyed by the cord and also how it drys out her nasal passages. Both she and her home health aide have called the O2 supplier to get a humidifier, but they have been unsuccessful. Jamie [name] is applying vasoline and is going to use saline nasal spray in the meantime. She has a persistent dry cough, but admits she is breathing better with the oxygen. She is using an inhaler twice a day, but doesn't think it helps. She is weak. Her appetite is only fair. She has diarrhea 3 times a day on occasion, but she has a history of IBS. She endorses anxiety and depression. She is chronically fatigued. Her memory is poor, with possible early staged of dementia. A detailed review of systems is otherwise stable.  PAST MEDICAL HISTORY: Past Medical History  Diagnosis Date  .  Anxiety   . Asthma   . Breast cancer     metastasized  . Depression   . Diabetes mellitus   . GERD (gastroesophageal reflux disease)   . Hyperlipidemia   . Internal hemorrhoids   . IBS (irritable bowel syndrome)   . Mitral valve prolapse   . Ischemic colitis   . Fibromyalgia   . Allergy   . Cataract   . S/P radiation therapy within four to twelve weeks 04/26/13-05/11/13     L-spine/sacrum 30Gy/58fx  . Colon polyp 20-Jan-2012    TUBULAR ADENOMA  Significant for diabetes, hypercholesterolemia, asthma, mitral valve prolapse requiring antibiotic prophylaxis before any invasive procedures, Meniere's disease, gastroesophageal reflux disease, osteoarthritis, degenerative disk disease, history of fibromyalgia, panic attacks, history of synovial cyst removal, history of total hysterectomy with bilateral salpingo-oophorectomy, history of septoplasty x2, status post appendectomy, status post dilatation and curettage remotely, status post bilateral carpal tunnel release under Dr. Clair Gulling [place].  PAST SURGICAL HISTORY: Past Surgical History  Procedure Laterality Date  . Mastectomy Left     then treated with chemo and radiation  . Total abdominal hysterectomy    . Back surgery      synovial cyst removed from spine  . Carpal tunnel release Bilateral   . Dilation and curettage of uterus    . Appendectomy    . Cataract extraction Bilateral   . Colonoscopy w/ biopsies  20-Jan-2012  . Esophagogastroduodenoscopy  01-20-07    normal    FAMILY HISTORY The patient's father died at the age of 50 from a stroke.  The patient's mother died at the age of 32, also from a stroke.  The patient has three brothers; one died with "liver disease."  One has prostate cancer.  The other brother has prostate and colon cancer.  The only breast cancer in the family was the patient's mother's mother and she does not know how old her grandmother was when she was diagnosed.  There is no history of ovarian cancer in the family.  GYNECOLOGIC HISTORY: She is GXP2.  She had her hysterectomy while still menstruating and she took hormone replacement until December 2006, when she had her breast biopsy.  SOCIAL HISTORY: (Updated 02/01/2014) She used to work as a [employer]ar[employer] manager.  Her husband Laverna Peace (who is the son of my former patient Pansie Guggisberg) is semiretired, occasionally working for a [employer].  What he likes to do is  hunt and fish.  Their son Chrissie Noa had significant muscular dystrophy and died in 2007/01/20. Their daughter Marcie Bal lives in [place] and works in an office.  The patient has two granddaughters, both RNs, one working at [employer] and the other in [place].    ADVANCED DIRECTIVES: Not in place  HEALTH MAINTENANCE:  (Updated 02/01/2014) History  Substance Use Topics  . Smoking status: Never Smoker   . Smokeless tobacco: Never Used  . Alcohol Use: No     Colonoscopy:  July 2013, Dr. Olevia Perches  PAP: s/p hysterectomy  Bone density:  November 2014 at [facility], normal  Lipid panel: Dr. Virgina Jock    Allergies  Allergen Reactions  . Hydrocodone Anxiety    Pt. Said she is fine with this medication  . Metformin And Related     GI sxs  . Morphine And Related   . Niacin Other (See Comments)    Dizziness   . Promethazine Hcl Other (See Comments)    Dizziness "FELT CRAZY"     Current Outpatient Prescriptions  Medication  Sig Dispense Refill  . ACCU-CHEK SMARTVIEW test strip   11  . acetaminophen (TYLENOL) 500 MG tablet Take 500 mg by mouth every 6 (six) hours as needed for mild pain or moderate pain (pain).     . ALREX 0.2 % SUSP Place 1 drop into both eyes daily.   0  . BYSTOLIC 5 MG tablet Take 1 tablet by mouth Daily.    . Calcium Carbonate (CALCIUM 600 PO) Take 1 tablet by mouth daily.    . Cholecalciferol (VITAMIN D) 1000 UNITS capsule Take 1,000 Units by mouth 2 (two) times daily.     . clonazePAM (KLONOPIN) 0.5 MG tablet Take 0.5 mg by mouth 2 (two) times daily.     . CRESTOR 5 MG tablet Take 1 tablet by mouth Daily.    Marland Kitchen esomeprazole (NEXIUM) 40 MG capsule Take 1 capsule (40 mg total) by mouth daily. 90 capsule 3  . exemestane (AROMASIN) 25 MG tablet Take 1 tablet (25 mg total) by mouth daily after breakfast. 30 tablet 12  . fluconazole (DIFLUCAN) 150 MG tablet Take 150 mg by mouth daily. Take 1 Tablet Every 10 Days.    Marland Kitchen glimepiride (AMARYL) 1 MG tablet Take 1 mg by mouth  daily.     Marland Kitchen GLYCERIN ADULT 2 G suppository Place 1 suppository rectally daily as needed for mild constipation (constipation).     . hydrochlorothiazide (HYDRODIURIL) 25 MG tablet Take 12.5 mg by mouth every Monday, Wednesday, and Friday.     . hydrocortisone (ANUSOL-HC) 2.5 % rectal cream Place 1 application rectally daily as needed for hemorrhoids or itching.     . lidocaine-hydrocortisone (ANAMANTEL HC) 3-0.5 % CREA Place 1 Applicatorful rectally 2 (two) times daily as needed for hemorrhoids and bleeding. 28.35 g 0  . ONGLYZA 5 MG TABS tablet Take 5 mg by mouth 2 (two) times daily.     Bertram Gala Glycol-Propyl Glycol (SYSTANE OP) Apply 1-2 drops to eye daily as needed (dry eyes).     . predniSONE (DELTASONE) 5 MG tablet Take 1 tablet (5 mg total) by mouth daily with breakfast. For 10 days 10 tablet 0  . PROAIR HFA 108 (90 BASE) MCG/ACT inhaler Inhale 1-2 puffs into the lungs every 6 (six) hours as needed for wheezing or shortness of breath (wheezing).     Marland Kitchen QVAR 80 MCG/ACT inhaler Inhale 2 puffs into the lungs 2 (two) times daily.     . sucralfate (CARAFATE) 1 G tablet Take 1 g by mouth 4 (four) times daily as needed.      No current facility-administered medications for this visit.    OBJECTIVE: Middle-aged white woman who carry a portable oxygen tank and wearing a nasal cannula Filed Vitals:   03/14/15 0847  BP: 108/77  Pulse: 86  Temp: 97.9 F (36.6 C)  Resp: 18     Body mass index is 24.17 kg/(m^2).    ECOG FS: 2 Filed Weights   03/14/15 0847  Weight: 140 lb 14.4 oz (63.912 kg)   Skin: warm, dry  HEENT: sclerae anicteric, conjunctivae pink, oropharynx clear. No thrush or mucositis.  Lymph Nodes: No cervical or supraclavicular lymphadenopathy  Lungs: clear to auscultation bilaterally, no rales, wheezes, or rhonci  Heart: regular rate and rhythm  Abdomen: round, soft, non tender, positive bowel sounds  Musculoskeletal: No focal spinal tenderness, no peripheral edema  Neuro:  non focal, well oriented, positive affect  Breasts; deferred   LAB RESULTS: Lab Results  Component Value Date  WBC 8.5 02/21/2015   NEUTROABS 3.4 02/21/2015   HGB 11.0* 02/21/2015   HCT 32.4* 02/21/2015   MCV 104.2* 02/21/2015   PLT 134* 02/21/2015      Chemistry      Component Value Date/Time   NA 143 02/21/2015 1347   NA 139 02/08/2015 0425   K 3.5 02/21/2015 1347   K 3.8 02/08/2015 0425   CL 105 02/08/2015 0425   CL 103 03/22/2013 0754   CO2 23 02/21/2015 1347   CO2 26 02/08/2015 0425   BUN 12.0 02/21/2015 1347   BUN 16 02/08/2015 0425   CREATININE 0.7 02/21/2015 1347   CREATININE 0.95 02/08/2015 0425      Component Value Date/Time   CALCIUM 9.8 02/21/2015 1347   CALCIUM 9.5 02/08/2015 0425   ALKPHOS 217* 02/21/2015 1347   ALKPHOS 162* 02/06/2015 2204   AST 78* 02/21/2015 1347   AST 80* 02/06/2015 2204   ALT 37 02/21/2015 1347   ALT 36* 02/06/2015 2204   BILITOT 0.55 02/21/2015 1347   BILITOT 0.9 02/06/2015 2204      Ref. Range 05/23/2014 11:32 08/15/2014 10:39 11/21/2014 12:31 01/21/2015 09:47 02/21/2015 13:47  CA 27.29 Latest Ref Range: 0-39 U/mL 115 (H) 208 (H) 436 (H) 780 (H) 930 (H)    STUDIES: Mr Cervical Spine W Wo Contrast  02/12/2015   CLINICAL DATA:  79 year old female with metastatic breast cancer, bone metastases. Severe neck pain greater on the left. Subsequent encounter.  EXAM: MRI CERVICAL SPINE WITHOUT AND WITH CONTRAST  TECHNIQUE: Multiplanar and multiecho pulse sequences of the cervical spine, to include the craniocervical junction and cervicothoracic junction, were obtained according to standard protocol without and with intravenous contrast.  CONTRAST:  65mL MULTIHANCE GADOBENATE DIMEGLUMINE 529 MG/ML IV SOLN  COMPARISON:  Total body bone scan 07/18/2014. Cervical spine MRI 11/16/2013. Brain MRI 01/01/2014.  FINDINGS: Since 2015, diffuse loss of normal bone marrow signal throughout the visible skeleton from the occipital condyles into the upper  thoracic spine. Diffuse vertebral involvement. The visible posterior occipital bones appear relatively spared. Diffuse abnormal marrow enhancement following contrast. Superimposed sclerotic nonenhancing chronic metastases in the upper thoracic spine primarily the T2 vertebral body.  Chronic mild retrolisthesis at C5-C6 and C6-C7, with chronic mild anterolisthesis at C4-C5 and C7-T1. No pathologic cervical vertebral fracture.  No cervical epidural or extraosseous tumor identified. No abnormal dural thickening or enhancement identified. No spinal cord signal abnormality identified. The cervicomedullary junction appears normal.  Superimposed cervical spine degeneration has not significantly changed, with mild multifactorial spinal stenosis at C5-C6 and C6-C7.  IMPRESSION: 1. Progressed metastatic disease to bone since 2015. There is now diffuse metastatic infiltration of bone marrow from the occipital condyles into the visible upper thoracic spine. No cervical pathologic fracture, epidural or extraosseous tumor identified. 2. No cervical intradural metastatic disease or abnormal spinal cord signal identified. 3. Stable superimposed cervical spine degeneration with C5-C6 and C6-C7 degenerative spinal stenosis.   Electronically Signed   By: Genevie Ann [name].   On: 02/12/2015 12:19   Nm Bone Scan Whole Body  02/19/2015   CLINICAL DATA:  History of left-sided breast cancer with left mastectomy in 2007. Subsequent radiation and chemotherapy. Restaging. Subsequent encounter.  EXAM: NUCLEAR MEDICINE WHOLE BODY BONE SCAN  TECHNIQUE: Whole body anterior and posterior images were obtained approximately 3 hours after intravenous injection of radiopharmaceutical.  RADIOPHARMACEUTICALS:  26.2 mCi Technetium-44m MDP IV  COMPARISON:  Whole body bone scan 07/18/2014 and 02/25/2014. Chest CT 02/07/2015. Abdominal CT 11/16/2014.  FINDINGS:  Compared with the most recent bone scan, there is mildly increased multifocal activity throughout the  axial skeleton, corresponding with sclerotic metastases on CT. Activity is greatest near the thoracolumbar junction on the posterior images. No significant involvement in the appendicular skeleton identified. Scattered mild arthropathic changes are noted. There is persistent dilatation of the right renal pelvis. The left collecting system and bladder appear unremarkable. No other soft tissue abnormalities identified.  IMPRESSION: 1. Interval slight worsening of multifocal osseous metastatic disease. 2. Persistent dilatation of the right renal pelvis consistent with an extrarenal pelvis or mild UPJ stenosis. No high-grade obstruction demonstrated on prior abdominal CT.   Electronically Signed   By: Richardean Sale [name].   On: 02/19/2015 14:16    ASSESSMENT:78 y.o.  [place] woman    (1) status post left modified radical mastectomy in January 2007 for a T1c N1, stage IIA  invasive lobular carcinoma, grade 1, strongly estrogen and progesterone receptor positive, HER-2 negative, with an MIB-1 of 7%.  (2)  Status post radiation given concurrently with Capecitabine.  Declined chemotherapy.    (3) Status post anastrozole and exemestane, both discontinued due to aches and pains.  Status post tamoxifen between October 2007 until August 2011, discontinued secondary to cramps   (4) multiple sclerotic bony lesions noted at initial diagnosis, biopsied x2 August 2009 without malignancy documented, on zoledronic acid annually starting 12/12/2007. Increased to monthly starting 07/17/2013  (5) On fulvestrant between September 2011 and 05/17/2013 with likely progression of bony disease  (6)  radiation to the lumbosacral spine and right hip to treat right hip pain to 30 gray completed 00/37/0488, complicated by [possible] radiation induced colitis  (7)  on letrozole since September 2014; discontinued May 2016 with progression  (8) received zoledronic acid monthly basis beginning September 2014, after May 2015  switched to denosumab, switched back to zolendronate 05/23/2014 to be given every 12 weeks (most recent dose 02/04//2016)  (9) exemestane stated 02/25/15  PLAN:  Jamie [name] has questions regarding her condition and a lot of this requires further evaluation that just has not been completed yet. She meets with Dr. Lamonte Sakai later this morning to obtain answers regarding her pulmonary status. Later this evening she is scheduled for a brain and liver MRI.   In the meantime, she will continue with exemestane daily. I am sending in a prescription for everolimus $RemoveBefore'10mg'[id number]$  daily to our outpatient pharmacy, but she will not begin this until she returns for follow up here. We discussed the risk of pneumonitis, which is rare, but nevertheless a risk considering she already has a dry cough. We may have to obtain serial chest xrays at some interval for better evaluation of this side effect.   Jamie [name] has been made an appointment in 2 weeks. At this point all pulmonary and imaging tests should be performed. She understands and agrees with this plan. She knows the goal of treatment in her case is control. She has been encouraged to call with any issues that might arise before her next visit here.    Laurie Panda, NP     03/14/2015

## 2015-03-14 NOTE — Progress Notes (Signed)
Subjective:    Patient ID: Jamie [name], female    DOB: [birth year], 79 y.o.   MRN: [id number]  HPI 79 yo woman, never smoker, with a history of breast cancer on maintenance therapy previously treated w mastectomy and XRT (last was few yrs ago), diabetes, GERD,allergic rhinitis, irritable bowel syndrome, pressure. She also carries a history of asthma made many years ago, on QVAR. Now sees Dr Neldon Mc for her allergic rhinitis. She has noted dyspnea on exertion going back to 12/'15, some dizziness w exertion. She was having wheezing and persistent cough during this time. The cough is non-productive. The cough has been worse over the last month.  She was treated with pred as an outpt, felt a bit better but ultimately admitted to [facility] in April '16. Identified hypoxemia, started 2L/min. I have reviewed the hospital notes, the CT-PA from 01/2015. No PE identified. I also reviewed Dr. Virgie Dad notes.  She had occupational cotton dust exposure many yrs ago. Second hand smoke exposure.    Review of Systems  Constitutional: Positive for appetite change and unexpected weight change. Negative for fever.  HENT: Positive for congestion. Negative for dental problem, ear pain, nosebleeds, postnasal drip, rhinorrhea, sinus pressure, sneezing, sore throat and trouble swallowing.   Eyes: Negative for redness and itching.  Respiratory: Positive for shortness of breath. Negative for cough, chest tightness and wheezing.   Cardiovascular: Negative for palpitations and leg swelling.  Gastrointestinal: Negative for nausea and vomiting.  Genitourinary: Negative for dysuria.  Musculoskeletal: Negative for joint swelling.  Skin: Negative for rash.  Neurological: Positive for headaches.  Hematological: Does not bruise/bleed easily.  Psychiatric/Behavioral: Positive for dysphoric mood. The patient is nervous/anxious.     Past Medical History  Diagnosis Date  . Anxiety   . Asthma   . Breast cancer    metastasized  . Depression   . Diabetes mellitus   . GERD (gastroesophageal reflux disease)   . Hyperlipidemia   . Internal hemorrhoids   . IBS (irritable bowel syndrome)   . Mitral valve prolapse   . Ischemic colitis   . Fibromyalgia   . Allergy   . Cataract   . S/P radiation therapy within four to twelve weeks 04/26/13-05/11/13    L-spine/sacrum 30Gy/23fx  . Colon polyp 02-03-12    TUBULAR ADENOMA     Family History  Problem Relation Age of Onset  . Prostate cancer Brother     x2  . Irritable bowel syndrome Mother   . Colon cancer Neg Hx   . Stroke Father      History   Social History  . Marital Status: Married    Spouse Name: N/A  . Number of Children: N/A  . Years of Education: N/A   Occupational History  . Not on file.   Social History Main Topics  . Smoking status: Never Smoker   . Smokeless tobacco: Never Used  . Alcohol Use: No  . Drug Use: No  . Sexual Activity: Yes    Birth Control/ Protection: Surgical   Other Topics Concern  . Not on file   Social History Narrative   She used to work as a Quarry manager.  Her husband Laverna Peace is semiretired, working for a [employer].  What he likes to do is hunt and fish.  Their son Chrissie Noa had significant muscular dystrophy and died in 02/03/07. Their daughter Marcie Bal lives in [place] and works in an office.  The patient has two granddaughters, both RNs, one working at [employer] and  the other in [place].                Allergies  Allergen Reactions  . Hydrocodone Anxiety    Pt. Said she is fine with this medication  . Metformin And Related     GI sxs  . Morphine And Related   . Niacin Other (See Comments)    Dizziness   . Promethazine Hcl Other (See Comments)    Dizziness "FELT CRAZY"      Outpatient Prescriptions Prior to Visit  Medication Sig Dispense Refill  . ACCU-CHEK SMARTVIEW test strip   11  . acetaminophen (TYLENOL) 500 MG tablet Take 500 mg by mouth every 6 (six) hours as needed for mild pain or moderate  pain (pain).     . ALREX 0.2 % SUSP Place 1 drop into both eyes daily.   0  . BYSTOLIC 5 MG tablet Take 1 tablet by mouth Daily.    . Calcium Carbonate (CALCIUM 600 PO) Take 1 tablet by mouth daily.    . Cholecalciferol (VITAMIN D) 1000 UNITS capsule Take 1,000 Units by mouth 2 (two) times daily.     . clonazePAM (KLONOPIN) 0.5 MG tablet Take 0.5 mg by mouth 2 (two) times daily.     . CRESTOR 5 MG tablet Take 1 tablet by mouth Daily.    Marland Kitchen esomeprazole (NEXIUM) 40 MG capsule Take 1 capsule (40 mg total) by mouth daily. 90 capsule 3  . everolimus (AFINITOR) 10 MG tablet Take 1 tablet (10 mg total) by mouth daily. 30 tablet 3  . exemestane (AROMASIN) 25 MG tablet Take 1 tablet (25 mg total) by mouth daily after breakfast. 30 tablet 12  . fluconazole (DIFLUCAN) 150 MG tablet Take 150 mg by mouth daily. Take 1 Tablet Every 10 Days.    Marland Kitchen glimepiride (AMARYL) 1 MG tablet Take 1 mg by mouth daily.     Marland Kitchen GLYCERIN ADULT 2 G suppository Place 1 suppository rectally daily as needed for mild constipation (constipation).     . hydrochlorothiazide (HYDRODIURIL) 25 MG tablet Take 12.5 mg by mouth every Monday, Wednesday, and Friday.     . hydrocortisone (ANUSOL-HC) 2.5 % rectal cream Place 1 application rectally daily as needed for hemorrhoids or itching.     . lidocaine-hydrocortisone (ANAMANTEL HC) 3-0.5 % CREA Place 1 Applicatorful rectally 2 (two) times daily as needed for hemorrhoids and bleeding. 28.35 g 0  . ONGLYZA 5 MG TABS tablet Take 5 mg by mouth 2 (two) times daily.     Vladimir Faster Glycol-Propyl Glycol (SYSTANE OP) Apply 1-2 drops to eye daily as needed (dry eyes).     . predniSONE (DELTASONE) 5 MG tablet Take 1 tablet (5 mg total) by mouth daily with breakfast. For 10 days 10 tablet 0  . PROAIR HFA 108 (90 BASE) MCG/ACT inhaler Inhale 1-2 puffs into the lungs every 6 (six) hours as needed for wheezing or shortness of breath (wheezing).     Marland Kitchen QVAR 80 MCG/ACT inhaler Inhale 2 puffs into the lungs 2  (two) times daily.     . sucralfate (CARAFATE) 1 G tablet Take 1 g by mouth 4 (four) times daily as needed.      No facility-administered medications prior to visit.         Objective:   Physical Exam Filed Vitals:   03/14/15 1110  BP: 124/64  Pulse: 78  Height: 5\' 4"  (1.626 m)  Weight: 141 lb (63.957 kg)  SpO2: 96%  Gen: Pleasant,  well-nourished, in no distress,  normal affect  ENT: No lesions,  mouth clear,  oropharynx clear, no postnasal drip  Neck: No JVD, no TMG, no carotid bruits  Lungs: No use of accessory muscles, no dullness to percussion, clear without rales or rhonchi  Cardiovascular: RRR, heart sounds normal, no murmur or gallops, no peripheral edema  Musculoskeletal: No deformities, no cyanosis or clubbing  Neuro: alert, non focal  Skin: Warm, no lesions or rashes    02/07/15 --  COMPARISON: 01/28/2015  FINDINGS: No pulmonary embolism.  Normal caliber thoracic aorta with mild scattered atherosclerotic disease. Upper normal heart size. Trace pericardial fluid. No pleural effusion.  Subcentimeter short axis mediastinal lymph nodes. Postoperative changes of left mastectomy. Upper abdominal images show nothing acute.  Central airways are patent. No pneumothorax. Right apical airspace and ground-glass opacities, similar to prior in favored to be post treatment related. Similarly, subpleural peripheral opacity left upper lobe anteriorly with bronchiectatic changes favored to be post radiation related.  Multifocal sclerotic lesions throughout the visualized bones.  Review of the MIP images confirms the above findings.  IMPRESSION: No pulmonary embolism.  (the right upper lobe airspace and interstitial opacities, favored to reflect post treatment/radiation change.  Diffuse osseous sclerotic metastases.  No acute intrathoracic process      Assessment & Plan:  SOB (shortness of breath) Dyspnea for several months dating back to the  end of 2015. She has a history of asthma and has been treated with Qvar and short acting beta agonist. It is not clear to me that her asthma was exacerbated and she has had no wheezing. She has had chronic cough so airflow obstruction could be part of her dyspnea. The fact that she was treated with prednisone without relief argues against this however. Given her dizziness with exertion I suspect that she was hypoxemic all this time. She was started on oxygen at her recent hospitalization and many of her symptoms have improved. Her CT scan of the chest shows left upper lobe interstitial disease and possibly some associated ground glass that look most consistent with the sequela of radiation therapy. We will need to decide the appropriate timing on rechecking her CT scan   Radiation pneumonitis There is interstitial disease present and I suspect that this was the cause of her hypoxemia and her exertional dyspnea. She is improved with oxygen. There is some associated groundglass and is not clear to me whether there is any active disease or inflammation present-the absence of a response to prednisone argues against this. I'll likely need to reimage her in the coming months to ensure stability   Acute respiratory failure with hypoxia We will continue 2 L/m. She would like to change oxygen companies to [employer]. I have asked them to get her the most portable system possible   Asthma If her asthma is currently active then the chief manifestation of this is cough. Her cough was temporarily better on prednisone but never resolved. I will continue Qvar for now. I suspect that her asthma is not the cause of her cough or dyspnea based on the history. I will perform full PFT to try to determine the contribution of asthma to her symptoms

## 2015-03-14 NOTE — Assessment & Plan Note (Addendum)
If her asthma is currently active then the chief manifestation of this is cough. Her cough was temporarily better on prednisone but never resolved. I will continue Qvar for now. I suspect that her asthma is not the cause of her cough or dyspnea based on the history. I will perform full PFT to try to determine the contribution of asthma to her symptoms

## 2015-03-14 NOTE — Assessment & Plan Note (Signed)
Dyspnea for several months dating back to the end of 2015. She has a history of asthma and has been treated with Qvar and short acting beta agonist. It is not clear to me that her asthma was exacerbated and she has had no wheezing. She has had chronic cough so airflow obstruction could be part of her dyspnea. The fact that she was treated with prednisone without relief argues against this however. Given her dizziness with exertion I suspect that she was hypoxemic all this time. She was started on oxygen at her recent hospitalization and many of her symptoms have improved. Her CT scan of the chest shows left upper lobe interstitial disease and possibly some associated ground glass that look most consistent with the sequela of radiation therapy. We will need to decide the appropriate timing on rechecking her CT scan

## 2015-03-14 NOTE — Progress Notes (Signed)
Per [employer] Questionnaire submitted. PA Case [id number] Status: Approved. Authorization and Notifications Pending.  afinitor 10mg  tablet

## 2015-03-14 NOTE — Assessment & Plan Note (Signed)
I suspect that this is chiefly related to upper airway irritation and cyclical cough syndrome. I'll like to empirically treat her for rhinitis and allergies.

## 2015-03-14 NOTE — Patient Instructions (Signed)
We will perform full pulmonary function testing Please start fluticasone nasal spray 2 sprays each nostril once a day Please start loratadine 10 mg daily We will change your oxygen company from [employer] to [employer]. We'll obtain the smallest most portable system possible Continue Qvar and pro-air as you have been taking them We will likely repeat either your chest x-ray or your CT scan of the chest in a few months to compare with April Follow with Dr Lamonte Sakai in 1 month

## 2015-03-18 ENCOUNTER — Other Ambulatory Visit: Payer: Self-pay | Admitting: Oncology

## 2015-03-20 ENCOUNTER — Other Ambulatory Visit: Payer: Self-pay | Admitting: Oncology

## 2015-03-20 ENCOUNTER — Telehealth: Payer: Self-pay | Admitting: Emergency Medicine

## 2015-03-20 ENCOUNTER — Telehealth: Payer: Self-pay

## 2015-03-20 DIAGNOSIS — C50919 Malignant neoplasm of unspecified site of unspecified female breast: Secondary | ICD-10-CM

## 2015-03-20 DIAGNOSIS — R531 Weakness: Secondary | ICD-10-CM

## 2015-03-20 DIAGNOSIS — R0609 Other forms of dyspnea: Secondary | ICD-10-CM

## 2015-03-20 DIAGNOSIS — M545 Low back pain, unspecified: Secondary | ICD-10-CM

## 2015-03-20 DIAGNOSIS — J4520 Mild intermittent asthma, uncomplicated: Secondary | ICD-10-CM

## 2015-03-20 DIAGNOSIS — R0781 Pleurodynia: Secondary | ICD-10-CM

## 2015-03-20 DIAGNOSIS — J9601 Acute respiratory failure with hypoxia: Secondary | ICD-10-CM

## 2015-03-20 DIAGNOSIS — R0902 Hypoxemia: Secondary | ICD-10-CM

## 2015-03-20 DIAGNOSIS — M797 Fibromyalgia: Secondary | ICD-10-CM

## 2015-03-20 DIAGNOSIS — R0789 Other chest pain: Secondary | ICD-10-CM

## 2015-03-20 DIAGNOSIS — M25551 Pain in right hip: Secondary | ICD-10-CM

## 2015-03-20 DIAGNOSIS — I493 Ventricular premature depolarization: Secondary | ICD-10-CM

## 2015-03-20 DIAGNOSIS — R0602 Shortness of breath: Secondary | ICD-10-CM

## 2015-03-20 DIAGNOSIS — C7951 Secondary malignant neoplasm of bone: Secondary | ICD-10-CM

## 2015-03-20 NOTE — Telephone Encounter (Signed)
Spoke with pt. She wanted to know what she was scheduled for in July. I advised her PFT and appt is with RB. Nothing further needed

## 2015-03-20 NOTE — Telephone Encounter (Signed)
Let patient know MRI showed no cancer- pt voiced understanding.    Call rcvd 6/1 from Genevieve Norlander [phone]) at [facility] reporting patient requesting hospice services.  Asked pt if she was still interested in a referral hospice.  Pt states "she is not ready to die, but someone had told her she could get some help from them."  I explained to the patient that hospice was only applicable to a patient who is within 6 months of death as certified by a MD.  I asked what help she needs.  Pt reports she can manage basic ADLs of dressing, bathing, feeding.  She is having difficulty with keeping up with her home, cooking and other activities needed to maintain independent living.  Reports she cooked breakfast this morning, but will not be able to clean up kitchen until probably 4 pm.  Reports weakness, fatigue, SOB (on home O2).  I advised patient we will get her in touch with social work - pt voiced understanding.  Spoke with Dr. Jana Hakim - ok to make home health referral pt/ot.    Routed to [employer].  Order placed to home health.

## 2015-03-21 ENCOUNTER — Telehealth: Payer: Self-pay | Admitting: *Deleted

## 2015-03-21 ENCOUNTER — Encounter: Payer: Self-pay | Admitting: *Deleted

## 2015-03-21 ENCOUNTER — Telehealth: Payer: Self-pay

## 2015-03-21 NOTE — Progress Notes (Signed)
[facility]  Clinical Social Work was referred by patient for assessment of psychosocial needs.  Clinical Social Worker was contacted by patient via phone inquiring about update on referral placed 03/07/15 to [facility] for help cleaning at home. CSW has not had return contact from that organization to date and explained to pt that it can take 4-6 weeks for referral to be accepted. CSW phoned non-profit and left vm requesting follow up. CSW inquired if pt would be open to additional help or resources in the home, pt declined and stated that Dr. Agustina Caroli office had arranged additional 02 assistance through [facility]. CSW again attempted to problem solve with pt of other avenues for support in the home, pt either was not open to certain options or had a reason as to why they would not be successful. Pt aware CSW to follow up as soon as return contact from [facility] is received  Clinical Social Work interventions: Supportive listening Problem solving Resource education  Loren Racer, [place]  [place] Phone: [phone] Fax: [phone]

## 2015-03-21 NOTE — Telephone Encounter (Signed)
[facility] called - reports pt having problems with her medication.  Provided verbal order for skillled nursing to set up pill box and manage medications appropriately.  Per Junie Panning, [facility] will send [place] paperwork for signature.

## 2015-03-21 NOTE — Telephone Encounter (Signed)
Return phone to patient-received vm yesterday @ 4:13 pm regarding pt's oxygen.  Spoke with pt this am and she is wanting to switch oxygen providers from [place] to [facility] and needs 02 saturation levels.  She has this straightened out now through her pulmonologist.  No other needs identified.

## 2015-03-27 ENCOUNTER — Telehealth: Payer: Self-pay

## 2015-03-27 ENCOUNTER — Other Ambulatory Visit: Payer: Self-pay

## 2015-03-27 DIAGNOSIS — C50919 Malignant neoplasm of unspecified site of unspecified female breast: Secondary | ICD-10-CM

## 2015-03-27 DIAGNOSIS — C7951 Secondary malignant neoplasm of bone: Principal | ICD-10-CM

## 2015-03-27 NOTE — Telephone Encounter (Signed)
Signed order faxed to [facility].  Sent to scan.

## 2015-03-28 ENCOUNTER — Other Ambulatory Visit: Payer: Self-pay

## 2015-03-28 ENCOUNTER — Ambulatory Visit (HOSPITAL_BASED_OUTPATIENT_CLINIC_OR_DEPARTMENT_OTHER): Payer: Commercial Managed Care - HMO | Admitting: Oncology

## 2015-03-28 ENCOUNTER — Encounter: Payer: Self-pay | Admitting: *Deleted

## 2015-03-28 ENCOUNTER — Other Ambulatory Visit (HOSPITAL_BASED_OUTPATIENT_CLINIC_OR_DEPARTMENT_OTHER): Payer: Commercial Managed Care - HMO

## 2015-03-28 ENCOUNTER — Telehealth: Payer: Self-pay | Admitting: Oncology

## 2015-03-28 VITALS — BP 140/51 | HR 74 | Temp 97.6°F | Resp 18 | Ht 64.0 in | Wt 137.4 lb

## 2015-03-28 DIAGNOSIS — Z170 Estrogen receptor positive status [ER+]: Secondary | ICD-10-CM

## 2015-03-28 DIAGNOSIS — C50911 Malignant neoplasm of unspecified site of right female breast: Secondary | ICD-10-CM

## 2015-03-28 DIAGNOSIS — C7951 Secondary malignant neoplasm of bone: Secondary | ICD-10-CM

## 2015-03-28 DIAGNOSIS — R0902 Hypoxemia: Secondary | ICD-10-CM

## 2015-03-28 DIAGNOSIS — C50912 Malignant neoplasm of unspecified site of left female breast: Secondary | ICD-10-CM

## 2015-03-28 DIAGNOSIS — C50919 Malignant neoplasm of unspecified site of unspecified female breast: Secondary | ICD-10-CM

## 2015-03-28 DIAGNOSIS — Z79811 Long term (current) use of aromatase inhibitors: Secondary | ICD-10-CM | POA: Diagnosis not present

## 2015-03-28 DIAGNOSIS — R0609 Other forms of dyspnea: Secondary | ICD-10-CM

## 2015-03-28 LAB — CBC WITH DIFFERENTIAL/PLATELET
BASO%: 0.6 % (ref 0.0–2.0)
Basophils Absolute: 0 10*3/uL (ref 0.0–0.1)
EOS%: 0.4 % (ref 0.0–7.0)
Eosinophils Absolute: 0 10*3/uL (ref 0.0–0.5)
HEMATOCRIT: 28.4 % — AB (ref 34.8–46.6)
HGB: 9.3 g/dL — ABNORMAL LOW (ref 11.6–15.9)
LYMPH%: 55.7 % — ABNORMAL HIGH (ref 14.0–49.7)
MCH: 35.2 pg — ABNORMAL HIGH (ref 25.1–34.0)
MCHC: 32.7 g/dL (ref 31.5–36.0)
MCV: 107.6 fL — AB (ref 79.5–101.0)
MONO#: 0.9 10*3/uL (ref 0.1–0.9)
MONO%: 12.5 % (ref 0.0–14.0)
NEUT%: 30.8 % — AB (ref 38.4–76.8)
NEUTROS ABS: 2.2 10*3/uL (ref 1.5–6.5)
Platelets: 108 10*3/uL — ABNORMAL LOW (ref 145–400)
RBC: 2.64 10*6/uL — ABNORMAL LOW (ref 3.70–5.45)
RDW: 17.1 % — ABNORMAL HIGH (ref 11.2–14.5)
WBC: 7.1 10*3/uL (ref 3.9–10.3)
lymph#: 4 10*3/uL — ABNORMAL HIGH (ref 0.9–3.3)
nRBC: 2 % — ABNORMAL HIGH (ref 0–0)

## 2015-03-28 LAB — COMPREHENSIVE METABOLIC PANEL (CC13)
ALT: 30 U/L (ref 0–55)
AST: 70 U/L — ABNORMAL HIGH (ref 5–34)
Albumin: 3.5 g/dL (ref 3.5–5.0)
Alkaline Phosphatase: 261 U/L — ABNORMAL HIGH (ref 40–150)
Anion Gap: 11 mEq/L (ref 3–11)
BUN: 10.7 mg/dL (ref 7.0–26.0)
CHLORIDE: 103 meq/L (ref 98–109)
CO2: 25 mEq/L (ref 22–29)
CREATININE: 0.9 mg/dL (ref 0.6–1.1)
Calcium: 9 mg/dL (ref 8.4–10.4)
EGFR: 66 mL/min/{1.73_m2} — ABNORMAL LOW (ref >90)
GLUCOSE: 254 mg/dL — AB (ref 70–140)
POTASSIUM: 3.6 meq/L (ref 3.5–5.1)
SODIUM: 140 meq/L (ref 136–145)
Total Bilirubin: 0.79 mg/dL (ref 0.20–1.20)
Total Protein: 6.8 g/dL (ref 6.4–8.3)

## 2015-03-28 LAB — TECHNOLOGIST REVIEW

## 2015-03-28 NOTE — Progress Notes (Signed)
ID: [name]   DOB: Apr 30, 1936  MR#: [id number]  GOT#:[id number]  PCP: [name], MD GYN: Everlene Farrier, MD SU: OTHER MD: Renato Shin, MD;  Delfin Edis, MD, Janna Arch DDS, Allena Katz [name]., Baltazar Apo [name].  CHIEF COMPLAINT:  Metastatic Breast Cancer  CURRENT THERAPY: Exemestane, zolendronate; everolimus    BREAST CANCER HISTORY: From the original intake note:  The patient's cancer was uncovered by an unusually circuitous route. She presented with stomach upset.  Because of a history of colitis, Dr. Teena Irani who happened to be on call for Dr. Cristina Gong, obtained CT of the abdomen and pelvis on August 30, 2005.  As far as the abdomen and pelvis were concerned, there was no evidence of colitis but there were multiple scattered sclerotic lesions in the bone suggesting possible sclerotic metastatic disease (versus osteopoikilosis.)  Accordingly a bone scan was obtained.  This was done on September 01, 2005, and found tiny sclerotic lesions, and in particular a lesion of concern in the sternum.  Accordingly a CT scan of the chest was obtained.  This found the sternal problem to correspond with degenerative changes. However, it also showed a soft tissue nodule in the left breast.  The patient then had mammograms on September 27, 2005.  This found two areas of spiculation and architectural distortion in the upper outer quadrant of the left breast.  By ultrasound these were hyperechoic and irregular, highly suspicious for carcinoma.  Biopsy was obtained the same day and showed [id number] [phone] [id number], as well as [phone]) invasive lobular carcinoma, with both lesions biopsied being ER and PR positive, both herceptest positive, both negative by FISH.  Accordingly the patient was referred to Dr. Georgette Dover and breast MRI was obtained October 06, 2006.  No abnormal enhancement was noted in the right breast.  There was enhancing nodularity in the upper portion of the left breast where several discrete  nodules were seen.  Accordingly Dr. Georgette Dover performed a left modified radical mastectomy on November 02, 2005.  The patient had a positive sentinel lymph node and Dr. Georgette Dover performed a left axillary lymph node dissection.  However, only three additional lymph nodes were recovered, two of which also showed metastatic involvement (all had extra capsular extension.)    The patient's subsequent history is as detailed below  INTERVAL HISTORY: "[name]" returns today for followup of her metastatic breast carcinoma. Since her last visit here we obtain an abdominal MRI to make sure there were no liver lesions to account for her abdominal problems. The liver is quite clean. We also obtained a brain MRI because of her confusion and inability to get things done. The brain MRI shows no evidence of metastatic disease to the brain.--She continues on exemestane. She has no symptoms related to the drug. We are ready to start everolimus at this point.   REVIEW OF SYSTEMS: [name] tells me she got her home nursing service changed to advanced home care, but that she can't get a new cord mother the old one being Friday, and her portable oxygen is very heavy. Her nose "bleeds all the time" from the nasal cannulas. The home oxygen is not 2 modified. Some days she can do more but many days she doesn't want to do anything and just goes to bed and sleeps all day. She says some days she coughs constantly and other days she doesn't cough at all. She has back and joint pain which is not different from before. She still has occasional headaches. She  feels anxious and depressed. Her blood sugars are "okay". A detailed review of systems today was otherwise stable  PAST MEDICAL HISTORY: Past Medical History  Diagnosis Date  . Anxiety   . Asthma   . Breast cancer     metastasized  . Depression   . Diabetes mellitus   . GERD (gastroesophageal reflux disease)   . Hyperlipidemia   . Internal hemorrhoids   . IBS (irritable bowel syndrome)    . Mitral valve prolapse   . Ischemic colitis   . Fibromyalgia   . Allergy   . Cataract   . S/P radiation therapy within four to twelve weeks 04/26/13-05/11/13    L-spine/sacrum 30Gy/82fx  . Colon polyp 2012/01/10    TUBULAR ADENOMA  Significant for diabetes, hypercholesterolemia, asthma, mitral valve prolapse requiring antibiotic prophylaxis before any invasive procedures, Meniere's disease, gastroesophageal reflux disease, osteoarthritis, degenerative disk disease, history of fibromyalgia, panic attacks, history of synovial cyst removal, history of total hysterectomy with bilateral salpingo-oophorectomy, history of septoplasty x2, status post appendectomy, status post dilatation and curettage remotely, status post bilateral carpal tunnel release under Dr. Clair Gulling [place].  PAST SURGICAL HISTORY: Past Surgical History  Procedure Laterality Date  . Mastectomy Left     then treated with chemo and radiation  . Total abdominal hysterectomy    . Back surgery      synovial cyst removed from spine  . Carpal tunnel release Bilateral   . Dilation and curettage of uterus    . Appendectomy    . Cataract extraction Bilateral   . Colonoscopy w/ biopsies  2012/01/10  . Esophagogastroduodenoscopy  01/10/07    normal    FAMILY HISTORY The patient's father died at the age of 82 from a stroke.  The patient's mother died at the age of 36, also from a stroke.  The patient has three brothers; one died with "liver disease."  One has prostate cancer.  The other brother has prostate and colon cancer.  The only breast cancer in the family was the patient's mother's mother and she does not know how old her grandmother was when she was diagnosed.  There is no history of ovarian cancer in the family.  GYNECOLOGIC HISTORY: She is GXP2.  She had her hysterectomy while still menstruating and she took hormone replacement until December 2006, when she had her breast biopsy.  SOCIAL HISTORY: (Updated 02/01/2014) She used to work  as a Quarry manager.  Her husband Laverna Peace (who is the son of my former patient Shaida Route) is semiretired, occasionally working for a Forensic scientist.  What he likes to do is hunt and fish.  Their son Chrissie Noa had significant muscular dystrophy and died in 2007/01/10. Their daughter Marcie Bal lives in [place] and works in an office.  The patient has two granddaughters, both RNs, one working at [employer] and the other in [place].    ADVANCED DIRECTIVES: Not in place  HEALTH MAINTENANCE:  (Updated 02/01/2014) History  Substance Use Topics  . Smoking status: Never Smoker   . Smokeless tobacco: Never Used  . Alcohol Use: No     Colonoscopy:  July 2013, Dr. Olevia Perches  PAP: s/p hysterectomy  Bone density:  November 2014 at [facility], normal  Lipid panel: Dr. Virgina Jock    Allergies  Allergen Reactions  . Hydrocodone Anxiety    Pt. Said she is fine with this medication  . Metformin And Related     GI sxs  . Morphine And Related   . Niacin  Other (See Comments)    Dizziness   . Promethazine Hcl Other (See Comments)    Dizziness "FELT CRAZY"     Current Outpatient Prescriptions  Medication Sig Dispense Refill  . ACCU-CHEK SMARTVIEW test strip   11  . acetaminophen (TYLENOL) 500 MG tablet Take 500 mg by mouth every 6 (six) hours as needed for mild pain or moderate pain (pain).     . ALREX 0.2 % SUSP Place 1 drop into both eyes daily.   0  . BYSTOLIC 5 MG tablet Take 1 tablet by mouth Daily.    . Calcium Carbonate (CALCIUM 600 PO) Take 1 tablet by mouth daily.    . Cholecalciferol (VITAMIN D) 1000 UNITS capsule Take 1,000 Units by mouth 2 (two) times daily.     . clonazePAM (KLONOPIN) 0.5 MG tablet Take 0.5 mg by mouth 2 (two) times daily.     . CRESTOR 5 MG tablet Take 1 tablet by mouth Daily.    Marland Kitchen esomeprazole (NEXIUM) 40 MG capsule Take 1 capsule (40 mg total) by mouth daily. 90 capsule 3  . everolimus (AFINITOR) 10 MG tablet Take 1 tablet (10 mg total) by mouth daily. 30 tablet 3  .  exemestane (AROMASIN) 25 MG tablet Take 1 tablet (25 mg total) by mouth daily after breakfast. 30 tablet 12  . fluconazole (DIFLUCAN) 150 MG tablet Take 150 mg by mouth daily. Take 1 Tablet Every 10 Days.    Marland Kitchen glimepiride (AMARYL) 1 MG tablet Take 1 mg by mouth daily.     Marland Kitchen GLYCERIN ADULT 2 G suppository Place 1 suppository rectally daily as needed for mild constipation (constipation).     . hydrochlorothiazide (HYDRODIURIL) 25 MG tablet Take 12.5 mg by mouth every Monday, Wednesday, and Friday.     . hydrocortisone (ANUSOL-HC) 2.5 % rectal cream Place 1 application rectally daily as needed for hemorrhoids or itching.     . lidocaine-hydrocortisone (ANAMANTEL HC) 3-0.5 % CREA Place 1 Applicatorful rectally 2 (two) times daily as needed for hemorrhoids and bleeding. 28.35 g 0  . ONGLYZA 5 MG TABS tablet Take 5 mg by mouth 2 (two) times daily.     Vladimir Faster Glycol-Propyl Glycol (SYSTANE OP) Apply 1-2 drops to eye daily as needed (dry eyes).     . predniSONE (DELTASONE) 5 MG tablet Take 1 tablet (5 mg total) by mouth daily with breakfast. For 10 days 10 tablet 0  . PROAIR HFA 108 (90 BASE) MCG/ACT inhaler Inhale 1-2 puffs into the lungs every 6 (six) hours as needed for wheezing or shortness of breath (wheezing).     Marland Kitchen QVAR 80 MCG/ACT inhaler Inhale 2 puffs into the lungs 2 (two) times daily.     . sucralfate (CARAFATE) 1 G tablet Take 1 g by mouth 4 (four) times daily as needed.     . traMADol (ULTRAM) 50 MG tablet TAKE 1 TABLET BY MOUTH EVERY 6 HOURS AS NEEDED FOR PAIN 60 tablet 1   No current facility-administered medications for this visit.    OBJECTIVE: Middle-aged white woman wearing a nasal oxygen cannula Filed Vitals:   03/28/15 0933  BP: 140/51  Pulse: 74  Temp: 97.6 F (36.4 C)  Resp: 18     Body mass index is 23.57 kg/(m^2).    ECOG FS: 2 Filed Weights   03/28/15 0933  Weight: 137 lb 6.4 oz (62.324 kg)   Sclerae unicteric, EOMs intact Oropharynx clear, no thrush or other  lesions No cervical or supraclavicular adenopathy  Lungs no rales or rhonchi Heart regular rate and rhythm Abd soft, nontender, positive bowel sounds, no masses palpated  MSK no focal spinal tenderness, no upper extremity lymphedema Neuro: nonfocal, well oriented, anxious affect Breasts: Deferred    LAB RESULTS: Lab Results  Component Value Date   WBC 7.1 03/28/2015   NEUTROABS 2.2 03/28/2015   HGB 9.3* 03/28/2015   HCT 28.4* 03/28/2015   MCV 107.6* 03/28/2015   PLT 108* 03/28/2015      Chemistry      Component Value Date/Time   NA 140 03/28/2015 0913   NA 139 02/08/2015 0425   K 3.6 03/28/2015 0913   K 3.8 02/08/2015 0425   CL 105 02/08/2015 0425   CL 103 03/22/2013 0754   CO2 25 03/28/2015 0913   CO2 26 02/08/2015 0425   BUN 10.7 03/28/2015 0913   BUN 16 02/08/2015 0425   CREATININE 0.9 03/28/2015 0913   CREATININE 0.95 02/08/2015 0425      Component Value Date/Time   CALCIUM 9.0 03/28/2015 0913   CALCIUM 9.5 02/08/2015 0425   ALKPHOS 261* 03/28/2015 0913   ALKPHOS 162* 02/06/2015 2204   AST 70* 03/28/2015 0913   AST 80* 02/06/2015 2204   ALT 30 03/28/2015 0913   ALT 36* 02/06/2015 2204   BILITOT 0.79 03/28/2015 0913   BILITOT 0.9 02/06/2015 2204      Ref. Range 05/23/2014 11:32 08/15/2014 10:39 11/21/2014 12:31 01/21/2015 09:47 02/21/2015 13:47  CA 27.29 Latest Ref Range: 0-39 U/mL 115 (H) 208 (H) 436 (H) 780 (H) 930 (H)    STUDIES: Mr Kizzie Fantasia Contrast  2015/03/21   CLINICAL DATA:  Breast cancer diagnosed in 2007. Recent onset of confusion.  EXAM: MRI HEAD WITHOUT AND WITH CONTRAST  TECHNIQUE: Multiplanar, multiecho pulse sequences of the brain and surrounding structures were obtained without and with intravenous contrast.  CONTRAST:  51mL MULTIHANCE GADOBENATE DIMEGLUMINE 529 MG/ML IV SOLN  COMPARISON:  MR brain 01/01/2014.  MRI cervical spine 02/12/2015.  FINDINGS: No evidence for acute infarction, hemorrhage, mass lesion, hydrocephalus, or extra-axial fluid.  Mild to moderate atrophy not unexpected for the patient's age. No significant white matter disease.  Post infusion, no abnormal enhancement of the brain or meninges. Pituitary, pineal, and cerebellar tonsils unremarkable. No upper cervical lesions.  Flow voids are maintained throughout the carotid, basilar, and vertebral arteries. There are no areas of chronic hemorrhage.  No destructive calvarial lesion. There has been subtle alteration of the visible marrow signal in the clivus, see for instance image 10 series 2, and in conjunction with the cervical spine findings, likely represents osseous disease. No intracranial extent. Diffuse metastatic disease with marrow replacement of the upper cervical region is redemonstrated, better seen on previous cervical MR in April.  Scalp and extracranial soft tissues, orbits, sinuses, and mastoids show no acute process.  IMPRESSION: No evidence for intracranial metastatic disease.  No significant white matter signal abnormalities to suggest post treatment effect.  Mild to moderate atrophy not unexpected for age.  Osseous metastatic disease to the clivus and upper cervical region as described without intracranial or cervical epidural tumor.   Electronically Signed   By: Rolla Flatten [name].   On: 2015/03/21 20:44   Mr Abdomen W Wo Contrast  03/15/2015   CLINICAL DATA:  Elevated LFTs. History of breast cancer. Bone metastasis.  EXAM: MRI ABDOMEN WITHOUT AND WITH CONTRAST  TECHNIQUE: Multiplanar multisequence MR imaging of the abdomen was performed both before and after the administration of intravenous contrast.  CONTRAST:  12 cc MultiHance  COMPARISON:  11/16/2014  FINDINGS: Lower chest:  No pleural or pericardial effusion.  Hepatobiliary: No suspicious liver abnormality. The gallbladder appears normal. No biliary dilatation.  Pancreas: Normal appearance of the pancreas. No pancreatic duct dilatation.  Spleen: The spleen is unremarkable.  Adrenals/Urinary Tract: Normal appearance  of the adrenal glands. Bilateral nephrolithiasis identified. There is increase in right hydronephrosis. The right ureter has a normal caliber and there is a transition point at the right UPJ, image 14 of series 3.  Stomach/Bowel: The stomach and the upper abdominal small bowel loops appear normal. The visualized portions of the colon are unremarkable.  Vascular/Lymphatic: Normal appearance of the abdominal aorta. No upper abdominal adenopathy.  Other: There is no free fluid or fluid collections within the upper abdomen.  Musculoskeletal: Diffuse bone metastasis are again identified throughout the thoracic and lumbar spine and bony pelvis.  IMPRESSION: 1. No evidence for liver metastasis. 2. Increase in right-sided hydronephrosis. Findings may reflect obstruction at the level of the right UPJ. 3. Nephrolithiasis. 4. Diffuse bone metastasis.   Electronically Signed   By: Kerby Moors [name].   On: 03/15/2015 11:41    ASSESSMENT:78 y.o.  [place] woman    (1) status post left modified radical mastectomy in January 2007 for a T1c N1, stage IIA  invasive lobular carcinoma, grade 1, strongly estrogen and progesterone receptor positive, HER-2 negative, with an MIB-1 of 7%.  (2)  Status post radiation given concurrently with Capecitabine.  Declined chemotherapy.    (3) Status post anastrozole and exemestane, both discontinued due to aches and pains.  Status post tamoxifen between October 2007 until August 2011, discontinued secondary to cramps   (4) multiple sclerotic bony lesions noted at initial diagnosis, biopsied x2 August 2009 without malignancy documented, on zoledronic acid annually starting 12/12/2007. Increased to monthly starting 07/17/2013  (5) On fulvestrant between September 2011 and 05/17/2013 with likely progression of bony disease  (6)  radiation to the lumbosacral spine and right hip to treat right hip pain to 30 gray completed 65/46/5035, complicated by [possible] radiation induced  colitis  (7)  on letrozole since September 2014; discontinued May 2016 with progression  (8) received zoledronic acid monthly basis beginning September 2014, after May 2015 switched to denosumab, switched back to zolendronate 05/23/2014 to be given every 12 weeks (most recent dose 02/04//2016)  (9) exemestane stated 02/25/15; everolimus added 03/28/2015  PLAN:  [name] continues to look remarkably well on the visits, but really does have significant problems. We are going to try to help her with her oxygen issues, although I told her I really would prefer she would get all that done through Dr. Agustina Caroli office. Specifically she would like a less heavy portable tank, and she would like to humidify her oxygen at home. She also needs a new long cord at home since the one she has is afraid. We are putting requests for all those today.  I don't know why she has abdominal problems. She has had these for many years. She says they are worse. I have told her from my point of view the MRI of the abdomen does not show any liver involvement or any cancer reason for this problem. She continued to work with Dr. Virgina Jock on that issue.  She does have unilateral hydronephrosis. There has been no change in her creatinine. At this point I don't think going in for a stent, with all her other problems, would be a good idea, but if things  do improve that is something we may want to do.  She is tolerating the exemestane well. Today we are adding everolimus. She understands that this can cause mucositis as well as pneumonitis. I am making her a return appointment in 2 weeks just to make sure she is tolerating things well. She will see me mid July and I'm getting a chest x-ray that day to make sure you have been no obvious pulmonary parenchymal changes from this drug. We are continuing to follow her CA-27-29. I hope that it will start turning around within the next 2 months.  Hospice has a new palliative home service, and if things  do not improve for Ann over the next couple months we might consider placing that referral.   Chauncey Cruel, MD     03/28/2015

## 2015-03-28 NOTE — Progress Notes (Signed)
[place] Work  Clinical Social Work met with pt prior to her appt today in order to follow up on past concerns and to offer support. Pt reports she continues to struggle with daily needs at home. She has had RN through [facility] coming and this has been helpful. Pt spoke positively about her husband today and that he had helped some around the house. She is very frustrated by her limitations due to her medical issues. CSW received follow up call from [employer] and they do not have maids in her area that are available currently. CSW explained to pt that the stated to reapply for assistance in a few months. CSW will continue to look for other options for assistance. Pt agreed to reach out to CSW when needed.    Clinical Social Work interventions: Supportive listening Resource education  Loren Racer, [place] Worker [place]  [place] Phone: [phone] Fax: [phone]

## 2015-03-28 NOTE — Telephone Encounter (Signed)
Appointments made  And avs printed for patient °

## 2015-03-31 ENCOUNTER — Telehealth: Payer: Self-pay | Admitting: *Deleted

## 2015-03-31 NOTE — Telephone Encounter (Signed)
O2 humidifiers being followed up.

## 2015-03-31 NOTE — Telephone Encounter (Signed)
TC from patient this morning initially stating that she needs humidifier bottles for her home oxygen and and smaller portable tank. She states she saw Dr. Jana Hakim on Friday and that these items were to be ordered. She then states she is having a serious nosebleed that she is having trouble stopping and she needs the 'humification'. Discussed the nosebleed with her-she's been having off and on ("more on than off"). Advised her that if nosebleed did not stop in the next few minutes that she needs to go to ED. She is applying pressure and ice to no avail.  She states she will go to ED if it does not stop bleeding. Her husband is home and can take her.  Val, RN could you check into 02 supplies? Pt states she uses [facility] and their fax # is [phone]

## 2015-04-02 ENCOUNTER — Telehealth: Payer: Self-pay | Admitting: *Deleted

## 2015-04-02 NOTE — Telephone Encounter (Signed)
TC from patient asking whether she should take Afinitor or not.  Spoke with Val, RN with Dr. Jana Hakim.  Patient is to hold the Afinitor and call back to this office to tell us how she is feeling. TC to patient and relayed above information.  Jamie [name] voiced understanding and will call back to triage Friday am.

## 2015-04-02 NOTE — Telephone Encounter (Signed)
TC received from pt stating that after beginning Afinitor on 03/28/15 she has new pain which starts at the top of her head and radiates to her shoulders.  She is having difficulty getting out of bed d/t increased pain. She is alternating Tramadol and Tylenol which help with pain for short periods of time.  Pt states that pain is a "9" at night and a "5" during the day.  She is concerned that the Afinitor is "not for her." Please advise.  Next appt on 04/10/15 with Mathews Argyle NP.

## 2015-04-03 ENCOUNTER — Ambulatory Visit (INDEPENDENT_AMBULATORY_CARE_PROVIDER_SITE_OTHER): Payer: Commercial Managed Care - HMO | Admitting: Neurology

## 2015-04-03 ENCOUNTER — Telehealth: Payer: Self-pay | Admitting: Emergency Medicine

## 2015-04-03 ENCOUNTER — Encounter: Payer: Self-pay | Admitting: Neurology

## 2015-04-03 ENCOUNTER — Telehealth: Payer: Self-pay | Admitting: Neurology

## 2015-04-03 ENCOUNTER — Encounter: Payer: Self-pay | Admitting: *Deleted

## 2015-04-03 VITALS — BP 151/51 | HR 87 | Temp 98.1°F | Ht 64.0 in | Wt 136.8 lb

## 2015-04-03 DIAGNOSIS — R0902 Hypoxemia: Secondary | ICD-10-CM

## 2015-04-03 DIAGNOSIS — R42 Dizziness and giddiness: Secondary | ICD-10-CM

## 2015-04-03 DIAGNOSIS — M899 Disorder of bone, unspecified: Secondary | ICD-10-CM

## 2015-04-03 NOTE — Telephone Encounter (Signed)
Patient called and stated that someone from [facility] called her and informed her that she needed her oxygen levels to be sent to them or they were going to "drop her". Patient says that [facility] stated they had been trying to call our office for months to get the information. Today was the patients first visit with Dr. Jaynee Eagles and after checking chart I believe [facility] has been reaching out to [place]. I gave the patient their number so she could call and check with them.

## 2015-04-03 NOTE — Telephone Encounter (Signed)
Thanks, it would not be [place] providing O2 levels. So thanks!

## 2015-04-03 NOTE — Progress Notes (Addendum)
[facility]    Provider:  Dr Jaynee Eagles Referring Provider: Shon Baton, MD Primary Care Physician:  [name], MD  CC:  vertigo  HPI:  Jamie [name] is a 79 y.o. female here as a referral from Dr. Virgina Jock for vertigo. She has a complicated PMHx of breast cancer s/p left mastectomy with metastatic spread, sclerotic bony lesions with progression, CKD, failure to thrive and malnutrition, DM, Fibromyalgia, anxiety and depression, MV prolapse, hld, panic attacks. She is here with her husband. She has been having episodes of vertigo which have resolved with the use of oxygen. When she is walking, her head starts spinning. She has nausea, vomiting with the vertigo. It can hit her anytime. She can't walk without. Started years ago and symptoms improved when she stopped eating salt. They started back 5 years ago and she has them 2-3 times a week. She went to the ED multiple times. Recently an ambulatory O2 saturation test showed desat to the 80s and she was placed on oxygen. Was placed on the oxygen and she hasn't had one since April.  No dizziness or vertigo since using oxygen. If she doesn' tuse the O2 her sats go down to 80s and she gets vertiginous. She has pain on the left side of the head and shoulder. It started 4 days ago. She has had this episode before, she has flares in her knee. This is related to the   Reviewed notes, labs and imaging from outside physicians, which showed:   MRI of the brain (personally reviewed images) IMPRESSION: No evidence for intracranial metastatic disease.  No significant white matter signal abnormalities to suggest post treatment effect.  Mild to moderate atrophy not unexpected for age.  Osseous metastatic disease to the clivus and upper cervical region as described without intracranial or cervical epidural tumor.  Cbc with anemia, mcv 105, platelets 56. cmp woth elevated glucose, elevated alkphos 261and egfr66    Review of  Systems: Patient complains of symptoms per HPI as well as the following symptoms : nose bleeds. Pertinent negatives per HPI. All others negative.   History   Social History  . Marital Status: Married    Spouse Name: Clair Gulling  . Number of Children: 1  . Years of Education: 14   Occupational History  . Retired    Social History Main Topics  . Smoking status: Never Smoker   . Smokeless tobacco: Never Used  . Alcohol Use: No  . Drug Use: No  . Sexual Activity: Yes    Birth Control/ Protection: Surgical   Other Topics Concern  . Not on file   Social History Narrative   She used to work as a Quarry manager.  Her husband Laverna Peace is semiretired, working for a [employer].  What he likes to do is hunt and fish.  Their son Chrissie Noa had significant muscular dystrophy and died in 01-29-2007. Their daughter Marcie Bal lives in [place] and works in an office.  The patient has two granddaughters, both RNs, one working at [employer] and the other in [place].           Lives at home with husband, Clair Gulling.    Caffeine use: Drinks 1 drink per day (coffee, tea, or soda)       Family History  Problem Relation Age of Onset  . Prostate cancer Brother     x2  . Irritable bowel syndrome Mother   . Colon cancer Neg Hx   . Stroke Father     Past Medical  History  Diagnosis Date  . Anxiety   . Asthma   . Breast cancer     metastasized  . Depression   . Diabetes mellitus   . GERD (gastroesophageal reflux disease)   . Hyperlipidemia   . Internal hemorrhoids   . IBS (irritable bowel syndrome)   . Mitral valve prolapse   . Ischemic colitis   . Fibromyalgia   . Allergy   . Cataract   . S/P radiation therapy within four to twelve weeks 04/26/13-05/11/13    L-spine/sacrum 30Gy/72fx  . Colon polyp 2013    TUBULAR ADENOMA    Past Surgical History  Procedure Laterality Date  . Mastectomy Left     then treated with chemo and radiation  . Total abdominal hysterectomy    . Back surgery      synovial cyst removed  from spine  . Carpal tunnel release Bilateral   . Dilation and curettage of uterus    . Appendectomy    . Cataract extraction Bilateral   . Colonoscopy w/ biopsies  2013  . Esophagogastroduodenoscopy  2008    normal    Current Outpatient Prescriptions  Medication Sig Dispense Refill  . ACCU-CHEK SMARTVIEW test strip   11  . acetaminophen (TYLENOL) 500 MG tablet Take 500 mg by mouth every 6 (six) hours as needed for mild pain or moderate pain (pain).     . ALREX 0.2 % SUSP Place 1 drop into both eyes daily.   0  . BYSTOLIC 5 MG tablet Take 1 tablet by mouth Daily.    . Calcium Carbonate (CALCIUM 600 PO) Take 1 tablet by mouth daily.    . Cholecalciferol (VITAMIN D) 1000 UNITS capsule Take 1,000 Units by mouth 2 (two) times daily.     . clonazePAM (KLONOPIN) 0.5 MG tablet Take 0.5 mg by mouth 2 (two) times daily.     . CRESTOR 5 MG tablet Take 1 tablet by mouth Daily.    Marland Kitchen esomeprazole (NEXIUM) 40 MG capsule Take 1 capsule (40 mg total) by mouth daily. 90 capsule 3  . fluconazole (DIFLUCAN) 150 MG tablet Take 150 mg by mouth daily. Take 1 Tablet Every 10 Days.    Marland Kitchen glimepiride (AMARYL) 1 MG tablet Take 1 mg by mouth daily.     Marland Kitchen GLYCERIN ADULT 2 G suppository Place 1 suppository rectally daily as needed for mild constipation (constipation).     . hydrochlorothiazide (HYDRODIURIL) 25 MG tablet Take 12.5 mg by mouth every Monday, Wednesday, and Friday.     . hydrocortisone (ANUSOL-HC) 2.5 % rectal cream Place 1 application rectally daily as needed for hemorrhoids or itching.     . lidocaine-hydrocortisone (ANAMANTEL HC) 3-0.5 % CREA Place 1 Applicatorful rectally 2 (two) times daily as needed for hemorrhoids and bleeding. 28.35 g 0  . ONGLYZA 5 MG TABS tablet Take 5 mg by mouth 2 (two) times daily.     Vladimir Faster Glycol-Propyl Glycol (SYSTANE OP) Apply 1-2 drops to eye daily as needed (dry eyes).     . predniSONE (DELTASONE) 5 MG tablet Take 1 tablet (5 mg total) by mouth daily with  breakfast. For 10 days 10 tablet 0  . PROAIR HFA 108 (90 BASE) MCG/ACT inhaler Inhale 1-2 puffs into the lungs every 6 (six) hours as needed for wheezing or shortness of breath (wheezing).     Marland Kitchen QVAR 80 MCG/ACT inhaler Inhale 2 puffs into the lungs 2 (two) times daily.     . sucralfate (CARAFATE) 1  G tablet Take 1 g by mouth 4 (four) times daily as needed.     . traMADol (ULTRAM) 50 MG tablet TAKE 1 TABLET BY MOUTH EVERY 6 HOURS AS NEEDED FOR PAIN 60 tablet 1  . everolimus (AFINITOR) 10 MG tablet Take 1 tablet (10 mg total) by mouth daily. 30 tablet 3  . exemestane (AROMASIN) 25 MG tablet Take 1 tablet (25 mg total) by mouth daily after breakfast. 30 tablet 12  . mirtazapine (REMERON) 15 MG tablet Take 15 mg by mouth daily.    Marland Kitchen venlafaxine XR (EFFEXOR-XR) 37.5 MG 24 hr capsule Take 37.5 mg by mouth daily.  4   No current facility-administered medications for this visit.    Allergies as of 04/03/2015 - Review Complete 04/03/2015  Allergen Reaction Noted  . Hydrocodone Anxiety 09/26/2013  . Metformin and related  01/09/2014  . Morphine and related  01/31/2014  . Niacin Other (See Comments) 08/20/2008  . Promethazine hcl Other (See Comments) 03/05/2008    Vitals: BP 151/51 mmHg  Pulse 87  Temp(Src) 98.1 F (36.7 C) (Oral)  Ht 5\' 4"  (1.626 m)  Wt 136 lb 12.8 oz (62.052 kg)  BMI 23.47 kg/m2 Last Weight:  Wt Readings from Last 1 Encounters:  04/03/15 136 lb 12.8 oz (62.052 kg)   Last Height:   Ht Readings from Last 1 Encounters:  04/03/15 5\' 4"  (1.626 m)    Physical exam: Exam: Gen: NAD, conversant, well nourised, obese, well groomed                     CV: RRR, no MRG. No Carotid Bruits. No peripheral edema, warm, nontender Eyes: Conjunctivae clear without exudates or hemorrhage  Neuro: Detailed Neurologic Exam  Speech:    Speech is normal; fluent and spontaneous with normal comprehension.  Cognition:    The patient is oriented to person, place, and time;     recent  and remote memory intact;     language fluent;     normal attention, concentration,     fund of knowledge Cranial Nerves:    The pupils are equal, round, and reactive to light. The fundi are flat. Visual fields are full to finger confrontation. Extraocular movements are intact. Trigeminal sensation is intact and the muscles of mastication are normal. The face is symmetric. The palate elevates in the midline. Hearing intact. Voice is normal. Shoulder shrug is normal. The tongue has normal motion without fasciculations.   Coordination:    Normal finger to nose and heel to shin.   Gait:    No ataxia  Motor Observation: Mild postura tremor Tone:    Normal muscle tone.    Posture:    Posture is normal. normal erect    Strength: Mild weakness in the bilateral hip flexors. Otherwise strength is V/V in the upper and lower limbs.      Sensation: intact to LT     Reflex Exam:  DTR's:    Deep tendon reflexes in the upper and lower extremities are symmetrical bilaterally.   Toes:    The toes are downgoing bilaterally.   Clonus:    Clonus is absent.      Assessment/Plan:   Jamie [name] is an unfortunate 79 y.o. female here as a referral from Dr. Virgina Jock for vertigo. She has a complicated PMHx of breast cancer s/p left mastectomy with metastatic spread, sclerotic bony lesions with progression, CKD, failure to thrive and malnutrition, DM, Fibromyalgia, anxiety and depression, MV prolapse, hld,  panic attacks. She is here with her husband. She was having vertiginous events. Recently an ambulatory O2 saturation test showed desat to the 80s and she was placed on oxygen. Was placed on the oxygen and she hasn't had vertigo since April.  I agree the symptoms were likely cause by O2 desaturation and she should continue to wear the O2 and follow up with her physicians to find the respiratory or other cause of this. Discussed MRI of the brain which did not show any brain parenchymal involvement however  there was osseous metastatic disease to the clivus and upper cervical region and they should continue to follow with hematology/oncology. I wish I could do more for this patient. She can follow up with me as needed.   Sarina Ill, MD  [facility] [address] [place], [place] [address]  Phone [phone] Fax [phone]

## 2015-04-03 NOTE — Telephone Encounter (Signed)
Spoke with Melissa at [facility], states order was on 5/27 to transition pt to [facility] care for 02.  Pt will need to have a qualifying walk by 6/27 to have her 02 switched over to [facility].  Spoke with pt, she will come in Monday morning approx 9:00 for qualifying walk test.  Will hold message until then to make sure Lenna Sciara is aware of walk results.

## 2015-04-03 NOTE — Patient Instructions (Signed)
Overall you are doing fairly well but I do want to suggest a few things today:   Remember to drink plenty of fluid, eat healthy meals and do not skip any meals. Try to eat protein with a every meal and eat a healthy snack such as fruit or nuts in between meals. Try to keep a regular sleep-wake schedule and try to exercise daily, particularly in the form of walking, 20-30 minutes a day, if you can.   I would like to see you back as needed, sooner if we need to. Please call us with any interim questions, concerns, problems, updates or refill requests.   Our phone number is [phone]. We also have an after hours call service for urgent matters and there is a physician on-call for urgent questions. For any emergencies you know to call 911 or go to the nearest emergency room

## 2015-04-03 NOTE — Progress Notes (Signed)
[place] Work  Clinical Social Work was referred by patient call for assessment of psychosocial needs.  Clinical Social Worker contacted patient at home to offer support and assess for needs.  Pt continues to struggle with complex family dynamics with spouse and needed to talk today. CSW and pt problem solved to day on ways to involve spouse more directly in her medical needs/concerns. Pt feels she can request to spouse to "come back" with her at appointments, but sometimes he does not agree with this plan. Pt shared concerns about [place] orders not being addressed appropriately. CSW informed pt that it appeared medical team was addressing per EPIC notes and also left vm for RN team. Pt denied other concerns and appreciated return call. CSW plans to follow up with pt at her next appt at [facility] on 04/10/15.   Clinical Social Work interventions: Supportive Psychiatric nurse education Caregiving concerns Pt advocacy  Loren Racer, [place] Worker [place]  [place] Phone: [phone] Fax: [phone]

## 2015-04-04 ENCOUNTER — Encounter (HOSPITAL_COMMUNITY): Payer: Self-pay | Admitting: Neurology

## 2015-04-04 ENCOUNTER — Emergency Department (HOSPITAL_COMMUNITY)
Admission: EM | Admit: 2015-04-04 | Discharge: 2015-04-04 | Disposition: A | Discharge status: Home / Self Care | Payer: Commercial Managed Care - HMO | Attending: Emergency Medicine | Admitting: Emergency Medicine

## 2015-04-04 DIAGNOSIS — I341 Nonrheumatic mitral (valve) prolapse: Secondary | ICD-10-CM | POA: Diagnosis not present

## 2015-04-04 DIAGNOSIS — F419 Anxiety disorder, unspecified: Secondary | ICD-10-CM | POA: Insufficient documentation

## 2015-04-04 DIAGNOSIS — F329 Major depressive disorder, single episode, unspecified: Secondary | ICD-10-CM | POA: Diagnosis not present

## 2015-04-04 DIAGNOSIS — C7951 Secondary malignant neoplasm of bone: Secondary | ICD-10-CM | POA: Diagnosis not present

## 2015-04-04 DIAGNOSIS — Z86010 Personal history of colonic polyps: Secondary | ICD-10-CM | POA: Insufficient documentation

## 2015-04-04 DIAGNOSIS — E785 Hyperlipidemia, unspecified: Secondary | ICD-10-CM | POA: Diagnosis not present

## 2015-04-04 DIAGNOSIS — E119 Type 2 diabetes mellitus without complications: Secondary | ICD-10-CM | POA: Diagnosis not present

## 2015-04-04 DIAGNOSIS — R040 Epistaxis: Secondary | ICD-10-CM | POA: Insufficient documentation

## 2015-04-04 DIAGNOSIS — Z7951 Long term (current) use of inhaled steroids: Secondary | ICD-10-CM | POA: Diagnosis not present

## 2015-04-04 DIAGNOSIS — Z853 Personal history of malignant neoplasm of breast: Secondary | ICD-10-CM | POA: Insufficient documentation

## 2015-04-04 DIAGNOSIS — J45909 Unspecified asthma, uncomplicated: Secondary | ICD-10-CM | POA: Diagnosis not present

## 2015-04-04 DIAGNOSIS — Z79899 Other long term (current) drug therapy: Secondary | ICD-10-CM | POA: Insufficient documentation

## 2015-04-04 DIAGNOSIS — K219 Gastro-esophageal reflux disease without esophagitis: Secondary | ICD-10-CM | POA: Insufficient documentation

## 2015-04-04 LAB — CBC WITH DIFFERENTIAL/PLATELET
BASOS PCT: 0 % (ref 0–1)
Basophils Absolute: 0 10*3/uL (ref 0.0–0.1)
EOS PCT: 0 % (ref 0–5)
Eosinophils Absolute: 0 10*3/uL (ref 0.0–0.7)
HEMATOCRIT: 24.6 % — AB (ref 36.0–46.0)
HEMOGLOBIN: 8.2 g/dL — AB (ref 12.0–15.0)
Lymphocytes Relative: 49 % — ABNORMAL HIGH (ref 12–46)
Lymphs Abs: 3.4 10*3/uL (ref 0.7–4.0)
MCH: 35 pg — AB (ref 26.0–34.0)
MCHC: 33.3 g/dL (ref 30.0–36.0)
MCV: 105.1 fL — AB (ref 78.0–100.0)
MONO ABS: 0.7 10*3/uL (ref 0.1–1.0)
Monocytes Relative: 10 % (ref 3–12)
Neutro Abs: 2.8 10*3/uL (ref 1.7–7.7)
Neutrophils Relative %: 41 % — ABNORMAL LOW (ref 43–77)
Platelets: 56 10*3/uL — ABNORMAL LOW (ref 150–400)
RBC: 2.34 MIL/uL — AB (ref 3.87–5.11)
RDW: 16.3 % — ABNORMAL HIGH (ref 11.5–15.5)
WBC: 6.9 10*3/uL (ref 4.0–10.5)

## 2015-04-04 MED ORDER — OXYCODONE-ACETAMINOPHEN 5-325 MG PO TABS
1.0000 | ORAL_TABLET | Freq: Once | ORAL | Status: AC
  Administered 2015-04-04: 1 via ORAL
  Filled 2015-04-04: qty 1

## 2015-04-04 NOTE — ED Notes (Signed)
Per PTAR- pt comes from home reporting nosebleed for 1 week intermittently; this morning she woke up with blood on pillow from nosebleed in the night. Yesterday PTAR came out and provided some nasal spray which stopped the bleeding. Pt is cancer pt with bone cancer, concern for platelet level being off after bleeding. BP 130/60, HR 88. Pt is a x 4, no bleeding at time. No blood thinners

## 2015-04-04 NOTE — ED Provider Notes (Signed)
CSN: [id number]     Arrival date & time 04/04/15  0856 History   First MD Initiated Contact with Patient 04/04/15 [phone]     Chief Complaint  Patient presents with  . Epistaxis     (Consider location/radiation/quality/duration/timing/severity/associated sxs/prior Treatment) Patient is a 79 y.o. female presenting with nosebleeds. The history is provided by the patient and medical records.  Epistaxis   This is a 79 year old female with past medical history significant for breast cancer with bony metastases, diabetes, depression, GERD, IBS, anxiety, presenting to the ED for epistaxis.  Patient states for the past week she has had intermittent nosebleed from both naris, although a left Carlton Adam seems to bleed more than right. States this morning she woke up with blood on her pillow. She denies any falls or facial trauma. Collier Salina came to her house yesterday and provided her with saline nasal spray, bleeding stopped after this. Patient is undergoing oral treatment for her cancer. She is under the care of Dr. Jana Hakim.  She is not currently on any anticoagulation. She denies recent aspirin or NSAID use. Patient is on chronic home oxygen, 2 L via nasal cannula at all times.  Patient denies any chest pain, shortness of breath, dizziness, or syncope.  Patient has been seen by Dr. Erik Obey (ENT) in the past.  Past Medical History  Diagnosis Date  . Anxiety   . Asthma   . Breast cancer     metastasized  . Depression   . Diabetes mellitus   . GERD (gastroesophageal reflux disease)   . Hyperlipidemia   . Internal hemorrhoids   . IBS (irritable bowel syndrome)   . Mitral valve prolapse   . Ischemic colitis   . Fibromyalgia   . Allergy   . Cataract   . S/P radiation therapy within four to twelve weeks 04/26/13-05/11/13    L-spine/sacrum 30Gy/83fx  . Colon polyp 2013    TUBULAR ADENOMA   Past Surgical History  Procedure Laterality Date  . Mastectomy Left     then treated with chemo and radiation  .  Total abdominal hysterectomy    . Back surgery      synovial cyst removed from spine  . Carpal tunnel release Bilateral   . Dilation and curettage of uterus    . Appendectomy    . Cataract extraction Bilateral   . Colonoscopy w/ biopsies  2013  . Esophagogastroduodenoscopy  2008    normal   Family History  Problem Relation Age of Onset  . Prostate cancer Brother     x2  . Irritable bowel syndrome Mother   . Colon cancer Neg Hx   . Stroke Father    History  Substance Use Topics  . Smoking status: Never Smoker   . Smokeless tobacco: Never Used  . Alcohol Use: No   OB History    No data available     Review of Systems  HENT: Positive for nosebleeds.   All other systems reviewed and are negative.     Allergies  Hydrocodone; Metformin and related; Morphine and related; Niacin; and Promethazine hcl  Home Medications   Prior to Admission medications   Medication Sig Start Date End Date Taking? Authorizing Provider  ACCU-CHEK SMARTVIEW test strip  08/25/14   Historical Provider, MD  acetaminophen (TYLENOL) 500 MG tablet Take 500 mg by mouth every 6 (six) hours as needed for mild pain or moderate pain (pain).     Historical Provider, MD  ALREX 0.2 % SUSP Place 1 drop  into both eyes daily.  08/20/14   Historical Provider, MD  BYSTOLIC 5 MG tablet Take 1 tablet by mouth Daily. 02/16/11   Historical Provider, MD  Calcium Carbonate (CALCIUM 600 PO) Take 1 tablet by mouth daily.    Historical Provider, MD  Cholecalciferol (VITAMIN D) 1000 UNITS capsule Take 1,000 Units by mouth 2 (two) times daily.     Historical Provider, MD  clonazePAM (KLONOPIN) 0.5 MG tablet Take 0.5 mg by mouth 2 (two) times daily.     Historical Provider, MD  CRESTOR 5 MG tablet Take 1 tablet by mouth Daily. 02/05/11   Historical Provider, MD  esomeprazole (NEXIUM) 40 MG capsule Take 1 capsule (40 mg total) by mouth daily. 09/25/14   Laban Emperor Zehr, PA-C  everolimus (AFINITOR) 10 MG tablet Take 1 tablet (10 mg  total) by mouth daily. 03/14/15   Laurie Panda, NP  exemestane (AROMASIN) 25 MG tablet Take 1 tablet (25 mg total) by mouth daily after breakfast. 02/21/15   Chauncey Cruel, MD  fluconazole (DIFLUCAN) 150 MG tablet Take 150 mg by mouth daily. Take 1 Tablet Every 10 Days. 02/04/15   Historical Provider, MD  glimepiride (AMARYL) 1 MG tablet Take 1 mg by mouth daily.     Historical Provider, MD  GLYCERIN ADULT 2 G suppository Place 1 suppository rectally daily as needed for mild constipation (constipation).  01/21/14   Historical Provider, MD  hydrochlorothiazide (HYDRODIURIL) 25 MG tablet Take 12.5 mg by mouth every Monday, Wednesday, and Friday.     Historical Provider, MD  hydrocortisone (ANUSOL-HC) 2.5 % rectal cream Place 1 application rectally daily as needed for hemorrhoids or itching.  05/18/13   Shon Baton, MD  lidocaine-hydrocortisone [facility]) 3-0.5 % CREA Place 1 Applicatorful rectally 2 (two) times daily as needed for hemorrhoids and bleeding. 05/17/13   Shon Baton, MD  mirtazapine (REMERON) 15 MG tablet Take 15 mg by mouth daily. 02/09/15   Historical Provider, MD  ONGLYZA 5 MG TABS tablet Take 5 mg by mouth 2 (two) times daily.  02/24/12   Historical Provider, MD  Polyethyl Glycol-Propyl Glycol (SYSTANE OP) Apply 1-2 drops to eye daily as needed (dry eyes).     Historical Provider, MD  predniSONE (DELTASONE) 5 MG tablet Take 1 tablet (5 mg total) by mouth daily with breakfast. For 10 days 02/18/15   Chauncey Cruel, MD  PROAIR HFA 108 (90 BASE) MCG/ACT inhaler Inhale 1-2 puffs into the lungs every 6 (six) hours as needed for wheezing or shortness of breath (wheezing).  05/30/12   Historical Provider, MD  QVAR 80 MCG/ACT inhaler Inhale 2 puffs into the lungs 2 (two) times daily.  09/22/14   Historical Provider, MD  sucralfate (CARAFATE) 1 G tablet Take 1 g by mouth 4 (four) times daily as needed.     Historical Provider, MD  traMADol (ULTRAM) 50 MG tablet TAKE 1 TABLET BY MOUTH EVERY 6 HOURS  AS NEEDED FOR PAIN 03/18/15   Chauncey Cruel, MD  venlafaxine XR (EFFEXOR-XR) 37.5 MG 24 hr capsule Take 37.5 mg by mouth daily. 01/21/15   Historical Provider, MD   BP 147/56 mmHg  Pulse 89  Temp(Src) 99.1 F (37.3 C) (Oral)  Resp 16  Ht 5\' 4"  (1.626 m)  Wt 136 lb (61.689 kg)  BMI 23.33 kg/m2  SpO2 92%   Physical Exam  Constitutional: She is oriented to person, place, and time. She appears well-developed and well-nourished. No distress.  HENT:  Head: Normocephalic and  atraumatic.  Nose: No sinus tenderness, nasal deformity, septal deviation or nasal septal hematoma. Epistaxis is observed.  Mouth/Throat: Oropharynx is clear and moist.  Dried blood in bilateral nares, no active bleeding; no signs of facial trauma; no septal hematoma or deformity; no blood in posterior oropharynx Nasal canula in place, 2L O2 in use (chronic)  Eyes: Conjunctivae and EOM are normal. Pupils are equal, round, and reactive to light.  Neck: Normal range of motion. Neck supple.  Cardiovascular: Normal rate, regular rhythm and normal heart sounds.   Pulmonary/Chest: Effort normal and breath sounds normal. No respiratory distress. She has no wheezes.  Abdominal: Soft. Bowel sounds are normal. There is no tenderness. There is no guarding.  Musculoskeletal: Normal range of motion.  Neurological: She is alert and oriented to person, place, and time.  Skin: Skin is warm and dry. She is not diaphoretic.  Psychiatric: She has a normal mood and affect.  Nursing note and vitals reviewed.   ED Course  Procedures (including critical care time) Labs Review Labs Reviewed  CBC WITH DIFFERENTIAL/PLATELET - Abnormal; Notable for the following:    RBC 2.34 (*)    Hemoglobin 8.2 (*)    HCT 24.6 (*)    MCV 105.1 (*)    MCH 35.0 (*)    RDW 16.3 (*)    Platelets 56 (*)    Neutrophils Relative % 41 (*)    Lymphocytes Relative 49 (*)    All other components within normal limits    Imaging Review No results  found.   EKG Interpretation None      MDM   Final diagnoses:  Epistaxis   79 year old female here with nosebleed intermittently for the past week. She has breast cancer with bony metastases, currently undergoing home treatment. She is not on anticoagulation. Patient is followed by Dr. Jana Hakim-- recent platelet counts have been somewhat low in the 100's range. She has no active bleeding on exam here, dried blood in both nares. No signs of trauma, septal hematoma or deformity. Given her cancer and low platelets, will re-check CBC.  Patient to be monitored for recurrent bleeding but it appears intermittent bleeding is due to dry nasal cavities from her home O2.  CBC with platelets low, now at 56.  H/H remains low but stable.  No recurrence of bleeding.  Case discussed with Dr. Jana Hakim-- ok to discharge now, he will monitor platelet counts.  Patient has FU appt next week with Dr. Jana Hakim.  Instructed on home afrin use, no more than 3 consecutive days.  Discussed plan with patient, he/she acknowledged understanding and agreed with plan of care.  Return precautions given for new or worsening symptoms.  Case discussed with attending physician, Dr. Ralene Bathe, who evaluated patient and agrees with assessment and plan of care.  Larene Pickett, PA-C 04/04/15 [place], PA-C 04/04/15 [place], PA-C 04/04/15 1254  Quintella Reichert, MD 04/04/15 1517

## 2015-04-04 NOTE — Discharge Instructions (Signed)
May use afrin at home for intermittent nosebleeds if not relieved by direct pressure.  Do not use for more than 3 consecutive days. Follow-up with Dr. Jana Hakim next week as scheduled. Return here for new concerns.

## 2015-04-05 ENCOUNTER — Emergency Department (HOSPITAL_COMMUNITY): Payer: Commercial Managed Care - HMO

## 2015-04-05 ENCOUNTER — Emergency Department (HOSPITAL_COMMUNITY)
Admission: EM | Admit: 2015-04-05 | Discharge: 2015-04-05 | Disposition: A | Discharge status: Home / Self Care | Payer: Commercial Managed Care - HMO | Attending: Emergency Medicine | Admitting: Emergency Medicine

## 2015-04-05 ENCOUNTER — Encounter (HOSPITAL_COMMUNITY): Payer: Self-pay | Admitting: Nurse Practitioner

## 2015-04-05 DIAGNOSIS — Z853 Personal history of malignant neoplasm of breast: Secondary | ICD-10-CM | POA: Insufficient documentation

## 2015-04-05 DIAGNOSIS — E785 Hyperlipidemia, unspecified: Secondary | ICD-10-CM | POA: Insufficient documentation

## 2015-04-05 DIAGNOSIS — E119 Type 2 diabetes mellitus without complications: Secondary | ICD-10-CM | POA: Diagnosis not present

## 2015-04-05 DIAGNOSIS — Z8669 Personal history of other diseases of the nervous system and sense organs: Secondary | ICD-10-CM | POA: Diagnosis not present

## 2015-04-05 DIAGNOSIS — K1379 Other lesions of oral mucosa: Secondary | ICD-10-CM | POA: Diagnosis present

## 2015-04-05 DIAGNOSIS — M797 Fibromyalgia: Secondary | ICD-10-CM | POA: Diagnosis not present

## 2015-04-05 DIAGNOSIS — K1232 Oral mucositis (ulcerative) due to other drugs: Secondary | ICD-10-CM | POA: Diagnosis not present

## 2015-04-05 DIAGNOSIS — F419 Anxiety disorder, unspecified: Secondary | ICD-10-CM | POA: Diagnosis not present

## 2015-04-05 DIAGNOSIS — F329 Major depressive disorder, single episode, unspecified: Secondary | ICD-10-CM | POA: Diagnosis not present

## 2015-04-05 DIAGNOSIS — K219 Gastro-esophageal reflux disease without esophagitis: Secondary | ICD-10-CM | POA: Insufficient documentation

## 2015-04-05 DIAGNOSIS — Z79899 Other long term (current) drug therapy: Secondary | ICD-10-CM | POA: Insufficient documentation

## 2015-04-05 DIAGNOSIS — Z86010 Personal history of colonic polyps: Secondary | ICD-10-CM | POA: Diagnosis not present

## 2015-04-05 DIAGNOSIS — J45901 Unspecified asthma with (acute) exacerbation: Secondary | ICD-10-CM | POA: Insufficient documentation

## 2015-04-05 DIAGNOSIS — Z923 Personal history of irradiation: Secondary | ICD-10-CM | POA: Diagnosis not present

## 2015-04-05 DIAGNOSIS — T50995A Adverse effect of other drugs, medicaments and biological substances, initial encounter: Secondary | ICD-10-CM | POA: Diagnosis not present

## 2015-04-05 MED ORDER — MAGIC MOUTHWASH: 5.0000 mL | Freq: Three times a day (TID) | ORAL | Status: DC | PRN

## 2015-04-05 MED ORDER — MAGIC MOUTHWASH
10.0000 mL | Freq: Once | ORAL | Status: AC
  Administered 2015-04-05: 10 mL via ORAL
  Filled 2015-04-05: qty 10

## 2015-04-05 NOTE — ED Provider Notes (Signed)
CSN: [id number]     Arrival date & time 04/05/15  1358 History   First MD Initiated Contact with Patient 04/05/15 1508     Chief Complaint  Patient presents with  . Medication Reaction     (Consider location/radiation/quality/duration/timing/severity/associated sxs/prior Treatment) Patient is a 79 y.o. female presenting with mouth sores. The history is provided by the patient.  Mouth Lesions Location:  Upper lip, lower lip, buccal mucosa, upper gingiva and lower gingiva Quality:  Painful and ulcerous Pain details:    Quality:  Aching and sharp   Severity:  Moderate   Duration:  1 day   Timing:  Constant   Progression:  Unchanged Onset quality:  Gradual Severity:  Moderate Progression:  Worsening Chronicity:  New Context comment:  Recent chemotherapy 0/86 with known complications of pneumonitis and mucositis Relieved by:  Nothing Worsened by:  Nothing tried Ineffective treatments:  None tried Associated symptoms: no fever     Past Medical History  Diagnosis Date  . Anxiety   . Asthma   . Breast cancer     metastasized  . Depression   . Diabetes mellitus   . GERD (gastroesophageal reflux disease)   . Hyperlipidemia   . Internal hemorrhoids   . IBS (irritable bowel syndrome)   . Mitral valve prolapse   . Ischemic colitis   . Fibromyalgia   . Allergy   . Cataract   . S/P radiation therapy within four to twelve weeks 04/26/13-05/11/13    L-spine/sacrum 30Gy/37fx  . Colon polyp 2013    TUBULAR ADENOMA   Past Surgical History  Procedure Laterality Date  . Mastectomy Left     then treated with chemo and radiation  . Total abdominal hysterectomy    . Back surgery      synovial cyst removed from spine  . Carpal tunnel release Bilateral   . Dilation and curettage of uterus    . Appendectomy    . Cataract extraction Bilateral   . Colonoscopy w/ biopsies  2013  . Esophagogastroduodenoscopy  2008    normal   Family History  Problem Relation Age of Onset  .  Prostate cancer Brother     x2  . Irritable bowel syndrome Mother   . Colon cancer Neg Hx   . Stroke Father    History  Substance Use Topics  . Smoking status: Never Smoker   . Smokeless tobacco: Never Used  . Alcohol Use: No   OB History    No data available     Review of Systems  Constitutional: Negative for fever.  HENT: Positive for mouth sores.   Respiratory: Positive for cough and shortness of breath.   All other systems reviewed and are negative.     Allergies  Hydrocodone; Metformin and related; Morphine and related; Niacin; Other; and Promethazine hcl  Home Medications   Prior to Admission medications   Medication Sig Start Date End Date Taking? Authorizing Provider  acetaminophen (TYLENOL) 500 MG tablet Take 500 mg by mouth every 6 (six) hours as needed for mild pain or moderate pain (pain).    Yes Historical Provider, MD  BYSTOLIC 5 MG tablet Take 1 tablet by mouth Daily. 02/16/11  Yes Historical Provider, MD  Calcium Carbonate (CALCIUM 600 PO) Take 1 tablet by mouth daily.   Yes Historical Provider, MD  Cholecalciferol (VITAMIN D) 1000 UNITS capsule Take 1,000 Units by mouth 2 (two) times daily.    Yes Historical Provider, MD  clonazePAM (KLONOPIN) 0.5 MG tablet Take  0.5 mg by mouth 2 (two) times daily.    Yes Historical Provider, MD  CRESTOR 5 MG tablet Take 1 tablet by mouth Daily. 02/05/11  Yes Historical Provider, MD  esomeprazole (NEXIUM) 40 MG capsule Take 1 capsule (40 mg total) by mouth daily. 09/25/14  Yes Jessica D Zehr, PA-C  exemestane (AROMASIN) 25 MG tablet Take 1 tablet (25 mg total) by mouth daily after breakfast. 02/21/15  Yes Chauncey Cruel, MD  fluconazole (DIFLUCAN) 150 MG tablet Take 150 mg by mouth every 30 (thirty) days.  02/04/15  Yes Historical Provider, MD  glimepiride (AMARYL) 1 MG tablet Take 1 mg by mouth as needed (low blood sugar).    Yes Historical Provider, MD  GLYCERIN ADULT 2 G suppository Place 1 suppository rectally daily as  needed for mild constipation (constipation).  01/21/14  Yes Historical Provider, MD  hydrochlorothiazide (HYDRODIURIL) 25 MG tablet Take 12.5 mg by mouth every Monday, Wednesday, and Friday.    Yes Historical Provider, MD  hydrocortisone (ANUSOL-HC) 2.5 % rectal cream Place 1 application rectally daily as needed for hemorrhoids or itching.  05/18/13  Yes Shon Baton, MD  lidocaine-hydrocortisone [facility]) 3-0.5 % CREA Place 1 Applicatorful rectally 2 (two) times daily as needed for hemorrhoids and bleeding. 05/17/13  Yes Shon Baton, MD  ONGLYZA 5 MG TABS tablet Take 5 mg by mouth daily.  02/24/12  Yes Historical Provider, MD  Polyethyl Glycol-Propyl Glycol (SYSTANE OP) Apply 1-2 drops to eye daily as needed (dry eyes).    Yes Historical Provider, MD  PROAIR HFA 108 (90 BASE) MCG/ACT inhaler Inhale 1-2 puffs into the lungs every 6 (six) hours as needed for wheezing or shortness of breath (wheezing).  05/30/12  Yes Historical Provider, MD  QVAR 80 MCG/ACT inhaler Inhale 2 puffs into the lungs at bedtime.  09/22/14  Yes Historical Provider, MD  sucralfate (CARAFATE) 1 G tablet Take 1 g by mouth daily as needed ("when stomach is really bad").    Yes Historical Provider, MD  traMADol (ULTRAM) 50 MG tablet TAKE 1 TABLET BY MOUTH EVERY 6 HOURS AS NEEDED FOR PAIN 03/18/15  Yes Chauncey Cruel, MD  Alum & Mag Hydroxide-Simeth (MAGIC MOUTHWASH) SOLN Take 5 mLs by mouth 3 (three) times daily as needed for mouth pain. 04/05/15   Leo Grosser, MD  everolimus (AFINITOR) 10 MG tablet Take 1 tablet (10 mg total) by mouth daily. Patient not taking: Reported on 04/04/2015 03/14/15   Laurie Panda, NP  predniSONE (DELTASONE) 5 MG tablet Take 1 tablet (5 mg total) by mouth daily with breakfast. For 10 days Patient not taking: Reported on 04/04/2015 02/18/15   Chauncey Cruel, MD   BP 127/35 mmHg  Pulse 80  Temp(Src) 99 F (37.2 C) (Oral)  Resp 16  Ht 5\' 4"  (1.626 m)  Wt 138 lb 4.8 oz (62.732 kg)  BMI 23.73 kg/m2   SpO2 100% Physical Exam  Constitutional: She is oriented to person, place, and time. She appears well-developed and well-nourished. No distress.  HENT:  Head: Normocephalic.  Mouth/Throat: Oral lesions (diffuse mild mucositis and lip cracking) present.  Eyes: Conjunctivae are normal.  Neck: Neck supple. No tracheal deviation present.  Cardiovascular: Normal rate and regular rhythm.   Pulmonary/Chest: Effort normal. No respiratory distress.  Abdominal: Soft. She exhibits no distension. There is no tenderness.  Neurological: She is alert and oriented to person, place, and time.  Skin: Skin is warm and dry.  Psychiatric: She has a normal mood and  affect.    ED Course  Procedures (including critical care time) Labs Review Labs Reviewed - No data to display  Imaging Review Dg Chest 2 View  04/05/2015   CLINICAL DATA:  Shortness of breath. Substernal chest pain and pressure. Dry cough the past 6 months. Asthma. Breast cancer.  EXAM: CHEST  2 VIEW  COMPARISON:  02/07/2015 and chest CTA dated 02/07/2015.  FINDINGS: Normal sized heart. Clear lungs. Multiple areas of vertebral body and proximal left humerus sclerosis without significant change. Left postmastectomy changes with left axillary surgical clips.  IMPRESSION: 1. No acute abnormality. 2. Stable sclerotic bony metastatic disease compatible with metastatic breast cancer.   Electronically Signed   By: Claudie Revering [name].   On: 04/05/2015 16:15     EKG Interpretation None      MDM   Final diagnoses:  Drug-induced mucositis    79 year old female presents with new onset lip cracking, mouth dryness and mild ulceration after having started everolimus 8 days ago. She is also had ongoing shortness of breath and cough symptoms. Her symptoms are consistent with known complications of the drug according to documentation by her oncologist. It appears they were planning on x-ray in July to evaluate for progression of pulmonary disease so we will  perform an interval study for comparison today. She was provided a mucositis mouthwash and recommended swabs to help keep her mouth moist and protect her mucous membranes. She is interested in referral to palliative care and management of diffuse pain symptoms at home.  Chest x-ray without significant interval change, patient provided mucositis mouthwash for symptoms. Care coordination contacted and agreed to send social work to the patient's house for home visit and begin process of palliative care to prevent recurrent emergency department visits as she has 5 in the last 6 months.  Leo Grosser, MD 04/06/15 Laureen Abrahams  Sherwood Gambler, MD 04/07/15 1501

## 2015-04-05 NOTE — Discharge Instructions (Signed)
Oral Mucositis Oral mucositis is a mouth condition that may develop from treatment used to cure cancer. With this condition, sores may appear on your lips, gums, tongue,and the roof or floor of your mouth. CAUSES  Oral mucositis can happen to anyone who is being treated with cancer therapies, including:  Cancer drugs (chemotherapy).  Radiation (X-ray or other high-energy rays) for head or neck cancer.  Bone marrow transplants and stem cell transplants. Oralmucositis is not caused by infection. However, the sores can become infected after they form. Infection can make oral mucositis worse. The following factors increase your risk of oral mucositis:  Poor oral hygiene.  Dental problems or oral diseases.  Smoking.  Chewing tobacco.  Drinking alcohol.  Having other diseases such as diabetes, human immunodeficiency virus (HIV), acquired immunodeficiency syndrome (AIDS), or kidney disease.  Not drinking enough water.  Having dentures that do not fit right.  Being a child. Children are more likely than adults to develop oral mucositis, but children usually heal more quickly.  Being elderly. Elderly adults are more likely to develop oral mucositis. SYMPTOMS  Symptoms vary. They may be mild or severe. Symptoms usually show up 7 to 10 days after starting treatment. Symptoms may include:   Sores in the mouth that bleed.  Color changes inside the mouth. Red, shiny areas appear.  White patches or pus in the mouth.  Pain in the mouth and throat.  Pain when talking.  Dryness and a burning feeling in the mouth.  Saliva that is dry and thick.  Trouble eating, drinking, and swallowing.  Weight loss and malnutrition. This happens because eating is a problem. DIAGNOSIS  A caregiver will check your mouth. Then, the condition is given a grade. This grading system will help your caregiver treat your condition:  Grade 1: The inside of the mouth is sore and red.  Grade 2: There  is redness in the mouth. Open sores are present. Swallowing food might be uncomfortable.  Grade 3: There are open sores. The mouth is very red. It is very hard to swallow food.  Grade 4: No food or drink can be swallowed. TREATMENT  Oral mucositis usually heals on its own. Sometimes, changes in the cancer treatment can help. Keeping the mouth as clean and germ free as possible is very important.  Medicine may ease the condition. Different types of medicine may be needed, such as:  An antibiotic to fight infection, if present.  Medicine to help mucosal cells heal more quickly.  A water-based moisturizer for your lips, if they are affected.  Methods to control pain may include:  Keeping your mouth moist. You may suck on ice chips or sugar-free frozen ice pops.  Pain relievers that are swished around in the mouth. They will make the mouth numb to ease the pain (topical anesthetics).  Specific mouth rinses.  Prescribed, medicated gels. The gel coats the mouth. This protects nerve endings and lowers pain.  Narcotic pain medicines. These are strong drugs. They may be used if pain is very bad.  Mouth care can keep the mouth as healthy as possible and help to prevent infection. Mouth care includes:  A dental checkup. Your dental caregiver will make sure you have no teeth problems that could cause infection. Try to have the dental checkup before you begin your treatment for cancer.  Brushing your teeth several times a day. Use a soft toothbrush. Change to a new brush often. Use only gentle toothpastes. Ask your caregiver what product would  be best for you. Make sure that you also floss your teeth.  Rinsing your mouth after every meal. Rinse again at bedtime. Do not use mouthwash that contains alcohol. Ask your caregiver what would be best for you. HOME CARE INSTRUCTIONS  Only take over-the-counter or prescription medicines for pain, discomfort, or fever as directed by your caregiver.  Follow the directions carefully.  Do not smoke.  Do not drink alcohol.  Eat only bland, soft foods until your mouth sores heal. Avoid sugary and acidic foods and drinks.  Ask your caregiver if you should add high-protein shakes to your diet to avoid malnutrition and weight loss.  Drink enough fluids to keep your urine clear or pale yellow.  If you have dentures, take them out often.  Continue to check your mouth every day for any signs of oral mucositis.  Keep all follow-up appointments. SEEK MEDICAL CARE IF:  You notice redness, soreness, or dryness in your mouth.  You have mouth or throat pain that makes it hard to swallow or speak. SEEK IMMEDIATE MEDICAL CARE IF:  Your pain in your mouth or throat gets worse and does not improve with pain medicine.  You have a lot of bleeding in your mouth.  You develop new, open sores in your mouth.  You notice patches of pus forming in your mouth.  You cannot swallow solid food or liquids.  You have a fever. Document Released: 05/21/2011 Document Revised: 12/27/2011 Document Reviewed: 05/21/2011 [facility] Patient Information 2015 [place], [place]. This information is not intended to replace advice given to you by your health care provider. Make sure you discuss any questions you have with your health care provider.  You will be contacted with information for a referral to palliative care. A social worker will be available to come to her house and work with you on your pain control needs.

## 2015-04-05 NOTE — ED Notes (Signed)
Since she started taking afinitor for bone cancer last week she has developed painful sores in her mouth and feels like there is something stuck in her throat. Denies trouble swallowing or breathing.

## 2015-04-06 NOTE — Care Management Note (Signed)
Case Management Note  Patient Details  Name: FARIA CASELLA MRN: [id number] Date of Birth: 09/08/1936  Subjective/Objective:                    Action/Plan:  Discharge planning Expected Discharge Date:  04/05/15               Expected Discharge Plan:  [place]  In-House Referral:     Discharge planning Services  CM Consult  Post Acute Care Choice:  Resumption of Svcs/PTA Provider Choice offered to:     DME Arranged:    DME Agency:     HH Arranged:  PT, RN [place] Agency:  [facility]  Status of Service:  Completed, signed off  Medicare Important Message Given:    Date Medicare IM Given:    Medicare IM give by:    Date Additional Medicare IM Given:    Additional Medicare Important Message give by:     If discussed at [place] of Stay Meetings, dates discussed:    Additional Comments: CM received a call (after hours) from MD requesting a SW be added on to HHPR/RN (pt active with [facility]) to address multiple ED visits (5 in last 6 mos) for non-emergent care.  CM called [facility] rep, Tiffany and explained situation and made note in SW order to please counsel pt to see if we can help the pt address some of her concerns with her PCP or with [facility] and possibly begin the conversation of end-of-life wishes.  No other CM needs were communicated. Dellie Catholic, RN 04/06/2015, 8:51 AM

## 2015-04-07 ENCOUNTER — Telehealth: Payer: Self-pay | Admitting: Oncology

## 2015-04-07 ENCOUNTER — Ambulatory Visit: Payer: Commercial Managed Care - HMO

## 2015-04-07 ENCOUNTER — Ambulatory Visit (HOSPITAL_BASED_OUTPATIENT_CLINIC_OR_DEPARTMENT_OTHER): Payer: Commercial Managed Care - HMO | Admitting: Nurse Practitioner

## 2015-04-07 ENCOUNTER — Other Ambulatory Visit: Payer: Self-pay

## 2015-04-07 ENCOUNTER — Encounter: Payer: Self-pay | Admitting: Nurse Practitioner

## 2015-04-07 VITALS — BP 162/59 | HR 98 | Temp 98.1°F | Resp 16 | Wt 138.9 lb

## 2015-04-07 DIAGNOSIS — C50919 Malignant neoplasm of unspecified site of unspecified female breast: Secondary | ICD-10-CM

## 2015-04-07 DIAGNOSIS — C50912 Malignant neoplasm of unspecified site of left female breast: Secondary | ICD-10-CM

## 2015-04-07 DIAGNOSIS — Z79811 Long term (current) use of aromatase inhibitors: Secondary | ICD-10-CM

## 2015-04-07 DIAGNOSIS — R21 Rash and other nonspecific skin eruption: Secondary | ICD-10-CM | POA: Diagnosis not present

## 2015-04-07 DIAGNOSIS — T451X5A Adverse effect of antineoplastic and immunosuppressive drugs, initial encounter: Secondary | ICD-10-CM

## 2015-04-07 DIAGNOSIS — Z170 Estrogen receptor positive status [ER+]: Secondary | ICD-10-CM | POA: Diagnosis not present

## 2015-04-07 DIAGNOSIS — K1231 Oral mucositis (ulcerative) due to antineoplastic therapy: Secondary | ICD-10-CM

## 2015-04-07 DIAGNOSIS — C7951 Secondary malignant neoplasm of bone: Secondary | ICD-10-CM

## 2015-04-07 DIAGNOSIS — D6959 Other secondary thrombocytopenia: Secondary | ICD-10-CM | POA: Insufficient documentation

## 2015-04-07 DIAGNOSIS — Z658 Other specified problems related to psychosocial circumstances: Secondary | ICD-10-CM | POA: Insufficient documentation

## 2015-04-07 DIAGNOSIS — D6481 Anemia due to antineoplastic chemotherapy: Secondary | ICD-10-CM

## 2015-04-07 NOTE — Telephone Encounter (Signed)
Pt was r/s to 04/22/15 but as stated in below phone note this needs to be done prior 6/27. Pt now scheduled for 04/08/15 at 10 am.

## 2015-04-07 NOTE — Assessment & Plan Note (Signed)
Patient reports continued, chronic stressors at home; specifically involving her husband.  Patient states that she feels her husband is not understanding of her illness.  She stated that her husband refused to buy food that she could eat; and that she will have to buy her own food when she gets paid tomorrow.  She states frustration regarding previous attempts to access some home care for herself.  Review of patient's chart-it does appear the patient has indeed had several interactions with the [place] social worker.  Most recent documentation reveals that social worker plans to follow-up again this coming Wednesday, 04/10/2015 when she returns for her next visit.  Also encouraged patient to follow-up with the [place] worker on an as-needed basis.

## 2015-04-07 NOTE — Telephone Encounter (Signed)
Pt is on schedule today at 12:45 to have O2 re-qualification -- need to have her come in later this afternoon for walk.  Once O2 sats are obtained, [facility] will need to be made aware of results.  LM for pt to return call - needed to try and reschedule to later time today as there will be no one available at 12:45 to do walk test.

## 2015-04-07 NOTE — Assessment & Plan Note (Signed)
Patient has some significant mucositis to the outer lower front gums.  No other obvious oral mucosal lesions.  No thrush noted.  Managing all oral secretions with no difficulty.  Patient presented to the emergency department on Saturday, 04/05/2015 for complaints of mucositis.  She was given Magic mouthwash without lidocaine at that time; but states she's only been using it once daily per directions.  After careful review.-It appears that the Magic mouthwash is indeed without lidocaine; but patient was instructed to use the Magic mouthwash 3 times per day.  Offered to call and patient Magic mouthwash with lidocaine; the patient states that she will try the Magic mouthwash without lidocaine every 6 hours on an as-needed basis.  Discussed the option of receiving IV fluid rehydration while the cancer Center today; the patient states that she does not have time to stay for IV fluid rehydration today.  Patient was encouraged to push fluids at home is much as possible.  Also, patient was encouraged to call/return if she feels she needs IV fluid rehydration.

## 2015-04-07 NOTE — Assessment & Plan Note (Addendum)
Chemotherapy-induced anemia.  Hemoglobin per labs drawn over the weekend decreased to 8.2.  Patient is complaining of some mild increased fatigue; but denies any shortness of breath with exertion.  Patient has plans to return this coming Wednesday, 04/10/2015; and we'll recheck labs at that time.

## 2015-04-07 NOTE — Assessment & Plan Note (Addendum)
Patient states that she has developed a very fine, non--pruritic rash to her chin area since initiating the Afinitor.  On exam-no obvious rash noted.  Advised patient that the mild/apparently resolved rash could very well be secondary to initiation of the Afinitor medication.  Advised patient to call/return of electrical to the emergency department for any worsening allergic-type symptoms whatsoever.
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
## 2015-04-07 NOTE — Assessment & Plan Note (Signed)
Chemotherapy-induced thrombocytopenia with platelet count down to 56.  Patient is experiencing increased bruising to some of her extremities; and is also had some intermittent, chronic nosebleeds that she is attributing to her 24/702 via nasal cannula.  She has no nosebleed now.  We'll continue to monitor closely.  Patient was advised to call/return or go directly to the emergency department for any worsening bleeding issues whatsoever.

## 2015-04-07 NOTE — Telephone Encounter (Signed)
Added appt per pof...per orders pt aware °

## 2015-04-07 NOTE — Assessment & Plan Note (Signed)
(  1) status post left modified radical mastectomy in January 2007 for a T1c N1, stage IIA invasive lobular carcinoma, grade 1, strongly estrogen and progesterone receptor positive, HER-2 negative, with an MIB-1 of 7%.  (2) Status post radiation given concurrently with Capecitabine. Declined chemotherapy.   (3) Status post anastrozole and exemestane, both discontinued due to aches and pains. Status post tamoxifen between October 2007 until August 2011, discontinued secondary to cramps   (4) multiple sclerotic bony lesions noted at initial diagnosis, biopsied x2 August 2009 without malignancy documented, on zoledronic acid annually starting 12/12/2007. Increased to monthly starting 07/17/2013  (5) On fulvestrant between September 2011 and 05/17/2013 with likely progression of bony disease  (6) radiation to the lumbosacral spine and right hip to treat right hip pain to 30 gray completed 98/28/6751, complicated by [possible] radiation induced colitis  (7) on letrozole since September 2014; discontinued May 2016 with progression  (8) received zoledronic acid monthly basis beginning September 2014, after May 2015 switched to denosumab, switched back to zolendronate 05/23/2014 to be given every 12 weeks   (9) exemestane stated 02/25/15; everolimus added 03/28/2015  Patient initiated the Affinitor on 03/28/2015; that made the decision to hold any further Afinitor on Tuesday, 04/01/2015 due to progressive fatigue and mucositis.  Patient continues to take the Aromasin as previously directed.  Patient last received her Zometa infusion on 02/26/2015.  She is scheduled to receive his Zometa on an every three-month basis.  Her next Zometa infusion will be due around 05/29/2015.  Patient is scheduled to return on 04/10/2015 for labs and a follow-up visit.

## 2015-04-08 ENCOUNTER — Ambulatory Visit: Payer: Self-pay

## 2015-04-08 ENCOUNTER — Telehealth: Payer: Self-pay

## 2015-04-08 NOTE — Telephone Encounter (Signed)
Called to follow up with pt after [facility] visit yesterday 6/20. Pt states she feels okay this morning she had some diarrhea but took some imodium and it seems to be helping so far. Pt states she will call if it gets any worse. Pt states her husband is making her a nice lunch today and emotionally feels very well. Pt confirmed appt for 6/23 for labs and [employer]. Pt denies any further questions or concerns at this time.

## 2015-04-09 ENCOUNTER — Telehealth: Payer: Self-pay | Admitting: *Deleted

## 2015-04-09 NOTE — Telephone Encounter (Signed)
Received call from [place] @ [facility] re:  Pt was referred by her PCP Dr. Virgina Jock to hospice.  Jamie [name] stated pt is admitted to hospice services as of today 04/09/15.  Pt wished to cancel appts with Nira Conn, NP for tomorrow 04/10/15 due to pt not feeling well, and tired of going to doctor's appts. Nira Conn, NP notified. Jamie [name]'s  Phone    [phone].

## 2015-04-10 ENCOUNTER — Ambulatory Visit: Payer: Self-pay | Admitting: Nurse Practitioner

## 2015-04-10 ENCOUNTER — Encounter: Payer: Self-pay | Admitting: *Deleted

## 2015-04-10 ENCOUNTER — Other Ambulatory Visit: Payer: Self-pay

## 2015-04-10 NOTE — Progress Notes (Signed)
[facility] Work  Clinical Social Work had planned to meet with pt at follow up appt to re-assess needs and check-in. CSW reviewed chart and made aware pt started with hospice services. CSW called pt at home and provided supportive listening. Pt expressed she was pleased with hospice care services. Pt reports her husband has been helping more and they have had some discussions about end of life wishes/planning. We processed ways to talk with or contact family in the days ahead and how hospice can further assist. Pt encouraged to reach out to CSW through hospice to continue work that this CSW has started. Pt agrees this is a good idea and will inquire about this resource. Pt aware how to reach out as needed.    Clinical Social Work interventions: Supportive listening Anticipatory grief discussion  Loren Racer, [place] Worker [place]  [place] Phone: [phone] Fax: [phone]

## 2015-04-11 ENCOUNTER — Telehealth: Payer: Self-pay

## 2015-04-11 NOTE — Telephone Encounter (Signed)
[facility].  Sent to scan.   

## 2015-04-22 ENCOUNTER — Ambulatory Visit: Payer: Commercial Managed Care - HMO

## 2015-04-24 ENCOUNTER — Other Ambulatory Visit: Payer: Commercial Managed Care - HMO

## 2015-04-29 ENCOUNTER — Inpatient Hospital Stay (HOSPITAL_COMMUNITY): Admission: RE | Admit: 2015-04-29 | Payer: Self-pay | Source: Ambulatory Visit

## 2015-04-29 ENCOUNTER — Ambulatory Visit: Payer: Self-pay | Admitting: Emergency Medicine

## 2015-04-29 ENCOUNTER — Ambulatory Visit: Payer: Commercial Managed Care - HMO

## 2015-05-04 NOTE — Progress Notes (Signed)
ID: Jamie [name]   DOB: 03-12-1944  MR#: [id number]  KCL#:[id number]  PCP: [name], MD GYN: Everlene Farrier, MD SU: OTHER MD: Renato Shin, MD;  Delfin Edis, MD, Janna Arch DDS, Allena Katz [name]., Baltazar Apo [name].  CHIEF COMPLAINT:  Metastatic Breast Cancer  CURRENT THERAPY: Exemestane, zolendronate; everolimus    BREAST CANCER HISTORY: From the original intake note:  The patient's cancer was uncovered by an unusually circuitous route. She presented with stomach upset.  Because of a history of colitis, Dr. Teena Irani who happened to be on call for Dr. Cristina Gong, obtained CT of the abdomen and pelvis on August 30, 2005.  As far as the abdomen and pelvis were concerned, there was no evidence of colitis but there were multiple scattered sclerotic lesions in the bone suggesting possible sclerotic metastatic disease (versus osteopoikilosis.)  Accordingly a bone scan was obtained.  This was done on September 01, 2005, and found tiny sclerotic lesions, and in particular a lesion of concern in the sternum.  Accordingly a CT scan of the chest was obtained.  This found the sternal problem to correspond with degenerative changes. However, it also showed a soft tissue nodule in the left breast.  The patient then had mammograms on September 27, 2005.  This found two areas of spiculation and architectural distortion in the upper outer quadrant of the left breast.  By ultrasound these were hyperechoic and irregular, highly suspicious for carcinoma.  Biopsy was obtained the same day and showed ([id number], as well as [phone]) invasive lobular carcinoma, with both lesions biopsied being ER and PR positive, both herceptest positive, both negative by FISH.  Accordingly the patient was referred to Dr. Georgette Dover and breast MRI was obtained October 06, 2006.  No abnormal enhancement was noted in the right breast.  There was enhancing nodularity in the upper portion of the left breast where several discrete  nodules were seen.  Accordingly Dr. Georgette Dover performed a left modified radical mastectomy on November 02, 2005.  The patient had a positive sentinel lymph node and Dr. Georgette Dover performed a left axillary lymph node dissection.  However, only three additional lymph nodes were recovered, two of which also showed metastatic involvement (all had extra capsular extension.)    The patient's subsequent history is as detailed below  INTERVAL HISTORY: "Jamie [name]" returns today for followup of her metastatic breast carcinoma. She continues on exemestane and everolimus. She tells me she had some terrible mouth sores at one point but they have resolved. She did not take any medication for this. It could be that she is no longer taking the everolimus but if so she is not aware of that. She is having occasional hot flashes. Otherwise she appears to be tolerating the 2 medications well.  REVIEW OF SYSTEMS: Jamie [name] tells me her husband wants her to move out of the house, and that his family is trying to find her own apartment nearby. She says she does not 1 to move because they will not let her take the dog. She basically wants to stay in the house. She says her husband may move out. However she also tells me that he is very sick, has heart trouble, and is in bed today. She tells me she spends a good deal of her own time in bed she is very tired. She watches a little bit of TV then gets sleepy and goes back to bed. Sometimes she is up at night however. She tells me there is a  lady who comes and helps give her a bath sometimes. She thinks this may be a hospice person but she is not sure. She thinks this lady, who lives too much, she says, still her red pants. She doesn't know if she wants her back. Jamie [name] tells me her appetite is poor. She has headaches at times. She denies pain. She admits to anxiety and depression. She thinks her sugars are working well. She thinks her breathing is doing "okay". A detailed review of systems today was  otherwise stable  PAST MEDICAL HISTORY: Past Medical History  Diagnosis Date  . Anxiety   . Asthma   . Breast cancer     metastasized  . Depression   . Diabetes mellitus   . GERD (gastroesophageal reflux disease)   . Hyperlipidemia   . Internal hemorrhoids   . IBS (irritable bowel syndrome)   . Mitral valve prolapse   . Ischemic colitis   . Fibromyalgia   . Allergy   . Cataract   . S/P radiation therapy within four to twelve weeks 04/26/69-05/11/69    L-spine/sacrum 30Gy/71fx  . Colon polyp 12-27-2011    TUBULAR ADENOMA  Significant for diabetes, hypercholesterolemia, asthma, mitral valve prolapse requiring antibiotic prophylaxis before any invasive procedures, Meniere's disease, gastroesophageal reflux disease, osteoarthritis, degenerative disk disease, history of fibromyalgia, panic attacks, history of synovial cyst removal, history of total hysterectomy with bilateral salpingo-oophorectomy, history of septoplasty x2, status post appendectomy, status post dilatation and curettage remotely, status post bilateral carpal tunnel release under Dr. Clair Gulling [place].  PAST SURGICAL HISTORY: Past Surgical History  Procedure Laterality Date  . Mastectomy Left     then treated with chemo and radiation  . Total abdominal hysterectomy    . Back surgery      synovial cyst removed from spine  . Carpal tunnel release Bilateral   . Dilation and curettage of uterus    . Appendectomy    . Cataract extraction Bilateral   . Colonoscopy w/ biopsies  12-27-11  . Esophagogastroduodenoscopy  12/27/06    normal    FAMILY HISTORY The patient's father died at the age of 81 from a stroke.  The patient's mother died at the age of 39, also from a stroke.  The patient has three brothers; one died with "liver disease."  One has prostate cancer.  The other brother has prostate and colon cancer.  The only breast cancer in the family was the patient's mother's mother and she does not know how old her grandmother was when  she was diagnosed.  There is no history of ovarian cancer in the family.  GYNECOLOGIC HISTORY: She is GXP2.  She had her hysterectomy while still menstruating and she took hormone replacement until December 2006, when she had her breast biopsy.  SOCIAL HISTORY: (Updated 02/01/2014) She used to work as a [employer]arry manager.  Her husband Laverna Peace (who is the son of my former patient Brittany Osier) is semiretired, occasionally working for a [employer].  What he likes to do is hunt and fish.  Their son Chrissie Noa had significant muscular dystrophy and died in 12-27-06. Their daughter Marcie Bal lives in [place] and works in an office.  The patient has two granddaughters, both RNs, one working at [employer] and the other in [place].    ADVANCED DIRECTIVES: Not in place  HEALTH MAINTENANCE:  (Updated 02/01/2014) History  Substance Use Topics  . Smoking status: Never Smoker   . Smokeless tobacco: Never Used  . Alcohol Use: No  Colonoscopy:  July 2013, Dr. Olevia Perches  PAP: s/p hysterectomy  Bone density:  November 2014 at [facility], normal  Lipid panel: Dr. Virgina Jock    Allergies  Allergen Reactions  . Hydrocodone Anxiety    Pt. Said she is fine with this medication  . Metformin And Related     GI sxs  . Morphine And Related   . Niacin Other (See Comments)    Dizziness   . Other Other (See Comments)    Salt (results in dizziness)  . Promethazine Hcl Other (See Comments)    Dizziness "FELT CRAZY"     Current Outpatient Prescriptions  Medication Sig Dispense Refill  . acetaminophen (TYLENOL) 500 MG tablet Take 500 mg by mouth every 6 (six) hours as needed for mild pain or moderate pain (pain).     . Alum & Mag Hydroxide-Simeth (MAGIC MOUTHWASH) SOLN Take 5 mLs by mouth 3 (three) times daily as needed for mouth pain. 60 mL 0  . BYSTOLIC 5 MG tablet Take 1 tablet by mouth Daily.    . Calcium Carbonate (CALCIUM 600 PO) Take 1 tablet by mouth daily.    . Cholecalciferol (VITAMIN D) 1000  UNITS capsule Take 1,000 Units by mouth 2 (two) times daily.     . clonazePAM (KLONOPIN) 0.5 MG tablet Take 0.5 mg by mouth 2 (two) times daily.     . CRESTOR 5 MG tablet Take 1 tablet by mouth Daily.    Marland Kitchen esomeprazole (NEXIUM) 40 MG capsule Take 1 capsule (40 mg total) by mouth daily. 90 capsule 3  . everolimus (AFINITOR) 10 MG tablet Take 1 tablet (10 mg total) by mouth daily. 30 tablet 3  . exemestane (AROMASIN) 25 MG tablet Take 1 tablet (25 mg total) by mouth daily after breakfast. 30 tablet 12  . glimepiride (AMARYL) 1 MG tablet Take 1 mg by mouth as needed (low blood sugar).     Marland Kitchen GLYCERIN ADULT 2 G suppository Place 1 suppository rectally daily as needed for mild constipation (constipation).     . hydrochlorothiazide (HYDRODIURIL) 25 MG tablet Take 12.5 mg by mouth every Monday, Wednesday, and Friday.     . hydrocortisone (ANUSOL-HC) 2.5 % rectal cream Place 1 application rectally daily as needed for hemorrhoids or itching.     . lidocaine-hydrocortisone (ANAMANTEL HC) 3-0.5 % CREA Place 1 Applicatorful rectally 2 (two) times daily as needed for hemorrhoids and bleeding. 28.35 g 0  . ONGLYZA 5 MG TABS tablet Take 5 mg by mouth daily.     Vladimir Faster Glycol-Propyl Glycol (SYSTANE OP) Apply 1-2 drops to eye daily as needed (dry eyes).     . predniSONE (DELTASONE) 5 MG tablet Take 1 tablet (5 mg total) by mouth daily with breakfast. For 10 days 10 tablet 0  . PROAIR HFA 108 (90 BASE) MCG/ACT inhaler Inhale 1-2 puffs into the lungs every 6 (six) hours as needed for wheezing or shortness of breath (wheezing).     Marland Kitchen QVAR 80 MCG/ACT inhaler Inhale 2 puffs into the lungs at bedtime.     . sucralfate (CARAFATE) 1 G tablet Take 1 g by mouth daily as needed ("when stomach is really bad").     . traMADol (ULTRAM) 50 MG tablet TAKE 1 TABLET BY MOUTH EVERY 6 HOURS AS NEEDED FOR PAIN 60 tablet 1   No current facility-administered medications for this visit.    OBJECTIVE: Middle-aged white woman using  portable oxygen Filed Vitals:   05/06/15 1446  BP: 142/58  Pulse: 88  Temp: 98 F (36.7 C)  Resp: 18     Body mass index is 23.3 kg/(m^2).    ECOG FS: 2-3 Filed Weights   05/06/15 1446  Weight: 135 lb 12.8 oz (61.598 kg)   weight April 2016, 138 pounds, current weight, 136 pounds  Sclerae unicteric, pupils round and equal Oropharynx clear and moist-- no thrush or other lesions No cervical or supraclavicular adenopathy Lungs no rales or rhonchi Heart regular rate and rhythm Abd soft, nontender, positive bowel sounds MSK no focal spinal tenderness, no upper extremity lymphedema Neuro: nonfocal, well oriented, mildly inappropriate affect, with some lability Breasts: Deferred    LAB RESULTS: Lab Results  Component Value Date   WBC 7.0 05/06/2015   NEUTROABS 2.0 05/06/2015   HGB 8.0* 05/06/2015   HCT 25.4* 05/06/2015   MCV 108.1* 05/06/2015   PLT 102* 05/06/2015      Chemistry      Component Value Date/Time   NA 142 05/06/2015 1419   NA 139 02/08/2015 0425   K 4.3 05/06/2015 1419   K 3.8 02/08/2015 0425   CL 105 02/08/2015 0425   CL 103 03/22/2013 0754   CO2 27 05/06/2015 1419   CO2 26 02/08/2015 0425   BUN 9.7 05/06/2015 1419   BUN 16 02/08/2015 0425   CREATININE 0.9 05/06/2015 1419   CREATININE 0.95 02/08/2015 0425      Component Value Date/Time   CALCIUM 10.2 05/06/2015 1419   CALCIUM 9.5 02/08/2015 0425   ALKPHOS 275* 05/06/2015 1419   ALKPHOS 162* 02/06/2015 2204   AST 59* 05/06/2015 1419   AST 80* 02/06/2015 2204   ALT 26 05/06/2015 1419   ALT 36* 02/06/2015 2204   BILITOT 0.69 05/06/2015 1419   BILITOT 0.9 02/06/2015 2204      Ref. Range 05/23/2014 11:32 08/15/2014 10:39 11/21/2014 12:31 01/21/2015 09:47 02/21/2015 13:47  CA 27.29 Latest Ref Range: 0-39 U/mL 115 (H) 208 (H) 436 (H) 780 (H) 930 (H)    STUDIES: No results found.  ASSESSMENT:78 y.o.  Ignacia Palma woman    (1) status post left modified radical mastectomy in January 2007 for a T1c N1,  stage IIA  invasive lobular carcinoma, grade 1, strongly estrogen and progesterone receptor positive, HER-2 negative, with an MIB-1 of 7%.  (2)  Status post radiation given concurrently with Capecitabine.  Declined chemotherapy.    (3) Status post anastrozole and exemestane, both discontinued due to aches and pains.  Status post tamoxifen between October 2007 until August 2011, discontinued secondary to cramps   (4) multiple sclerotic bony lesions noted at initial diagnosis, biopsied x2 August 2009 without malignancy documented, on zoledronic acid annually starting 12/12/2007. Increased to monthly starting 07/17/2013  (5) On fulvestrant between September 2011 and 05/17/2013 with likely progression of bony disease  (6)  radiation to the lumbosacral spine and right hip to treat right hip pain to 30 gray completed 40/34/7425, complicated by [possible] radiation induced colitis  (7)  on letrozole since September 2014; discontinued May 2016 with progression  (8) received zoledronic acid monthly basis beginning September 2014, after May 2015 switched to denosumab, switched back to zolendronate 05/23/2014 to be given every 12 weeks (most recent dose 02/04//2016)  (9) exemestane stated 02/25/15; everolimus added 03/28/2015  PLAN:  Jamie [name] seems to be somewhat confused. On the other hand she gives a very detailed and circumstantial accounts of what is happening at her home. I don't know if she is confabulating, having some paranoid ideation,  which would be mild in her case, or just giving me a picture of reality as she experiences it.  From a breast cancer point of view as far as I can tell she is taking her medications. We are going to repeat a bone scan in about a month which will be 3 months from her last one, and assess for response. We will also obtain a CA-27-29 prior to the next visit here.  That visit will be August 31. She will receive zolendronate the same day. At that time we will try to make a  determination whether or not the current treatment is working for her. At the same time I gave her a card so her home health nurse (which I think is what she is describing as hospice) can call us and give [place] an update from their point of view as to what is going on in the home.   Chauncey Cruel, MD     05/06/2015

## 2015-05-06 ENCOUNTER — Ambulatory Visit (HOSPITAL_BASED_OUTPATIENT_CLINIC_OR_DEPARTMENT_OTHER): Payer: Commercial Managed Care - HMO | Admitting: Oncology

## 2015-05-06 ENCOUNTER — Other Ambulatory Visit (HOSPITAL_BASED_OUTPATIENT_CLINIC_OR_DEPARTMENT_OTHER): Payer: Commercial Managed Care - HMO

## 2015-05-06 VITALS — BP 142/58 | HR 88 | Temp 98.0°F | Resp 18 | Ht 64.0 in | Wt 135.8 lb

## 2015-05-06 DIAGNOSIS — C7951 Secondary malignant neoplasm of bone: Secondary | ICD-10-CM | POA: Diagnosis not present

## 2015-05-06 DIAGNOSIS — C50919 Malignant neoplasm of unspecified site of unspecified female breast: Secondary | ICD-10-CM

## 2015-05-06 DIAGNOSIS — C50912 Malignant neoplasm of unspecified site of left female breast: Secondary | ICD-10-CM

## 2015-05-06 DIAGNOSIS — E119 Type 2 diabetes mellitus without complications: Secondary | ICD-10-CM

## 2015-05-06 LAB — COMPREHENSIVE METABOLIC PANEL (CC13)
ALK PHOS: 275 U/L — AB (ref 40–150)
ALT: 26 U/L (ref 0–55)
AST: 59 U/L — AB (ref 5–34)
Albumin: 3.4 g/dL — ABNORMAL LOW (ref 3.5–5.0)
Anion Gap: 9 mEq/L (ref 3–11)
BUN: 9.7 mg/dL (ref 7.0–26.0)
CO2: 27 meq/L (ref 22–29)
CREATININE: 0.9 mg/dL (ref 0.6–1.1)
Calcium: 10.2 mg/dL (ref 8.4–10.4)
Chloride: 106 mEq/L (ref 98–109)
EGFR: 64 mL/min/{1.73_m2} — ABNORMAL LOW (ref >90)
Glucose: 192 mg/dl — ABNORMAL HIGH (ref 70–140)
Potassium: 4.3 mEq/L (ref 3.5–5.1)
Sodium: 142 mEq/L (ref 136–145)
TOTAL PROTEIN: 6.5 g/dL (ref 6.4–8.3)
Total Bilirubin: 0.69 mg/dL (ref 0.20–1.20)

## 2015-05-06 LAB — CBC WITH DIFFERENTIAL/PLATELET
BASO%: 0.4 % (ref 0.0–2.0)
Basophils Absolute: 0 10*3/uL (ref 0.0–0.1)
EOS%: 0.9 % (ref 0.0–7.0)
Eosinophils Absolute: 0.1 10*3/uL (ref 0.0–0.5)
HEMATOCRIT: 25.4 % — AB (ref 34.8–46.6)
HGB: 8 g/dL — ABNORMAL LOW (ref 11.6–15.9)
LYMPH%: 57.9 % — ABNORMAL HIGH (ref 14.0–49.7)
MCH: 34 pg (ref 25.1–34.0)
MCHC: 31.5 g/dL (ref 31.5–36.0)
MCV: 108.1 fL — AB (ref 79.5–101.0)
MONO#: 0.9 10*3/uL (ref 0.1–0.9)
MONO%: 13.1 % (ref 0.0–14.0)
NEUT#: 2 10*3/uL (ref 1.5–6.5)
NEUT%: 27.7 % — ABNORMAL LOW (ref 38.4–76.8)
Platelets: 102 10*3/uL — ABNORMAL LOW (ref 145–400)
RBC: 2.35 10*6/uL — ABNORMAL LOW (ref 3.70–5.45)
RDW: 18.4 % — AB (ref 11.2–14.5)
WBC: 7 10*3/uL (ref 3.9–10.3)
lymph#: 4.1 10*3/uL — ABNORMAL HIGH (ref 0.9–3.3)
nRBC: 3 % — ABNORMAL HIGH (ref 0–0)

## 2015-05-07 ENCOUNTER — Telehealth: Payer: Self-pay | Admitting: Oncology

## 2015-05-07 NOTE — Telephone Encounter (Signed)
Confirmed appointments for August

## 2015-05-08 ENCOUNTER — Encounter: Payer: Self-pay | Admitting: *Deleted

## 2015-05-08 NOTE — Progress Notes (Signed)
[place] Work  Clinical Social Work was contacted by patient via phone for assessment of psychosocial needs.  Clinical Social Worker talked with her for long time. Pt seemed hard to follow during conversation and talked in many directions. CSW was able to piece together the following information.  It appears she may have fired hospice, as she was presented by hospice RN with the choice of seeing [facility] MDs or receiving hospice services. Pt could still be followed by MD here at [facility], but pt could not receive active, aggressive treatment. She states she still wants to see her doctor, but does not want more chemo. She had been using [facility]. [facility] is still following for oxygen and DME.   Pt states her husband has been helping at home a lot more and cooking for her. She reports he is trying to "move her out" and that she "is not going there if there is no way to eat". CSW inquired about said residence that she may be moved to and it sounded like and ALF. CSW asked pt to ask husband the name of the place. She reports it is [facility], which is an ALF. They have paperwork to fill out and it sounds as if both pt and her husband maybe moving there in the near future. I explained to her the help she would receive at ALF. She appeared more open to the idea.  Pt concerned about current HH needs and wants to make sure she is still going to receive oxygen and services. Hospice has not returned after last visit due to pt's plan to continue to be followed. Pt sounds somewhat confused and may need assistance with making care decisions.  CSW left vm for hospice RN.   Clinical Social Work interventions: Supportive listening Education  Loren Racer, [place] Social Worker [facility]  [place] Phone: [phone] Fax: [phone]

## 2015-06-11 ENCOUNTER — Ambulatory Visit (HOSPITAL_COMMUNITY)
Admission: RE | Admit: 2015-06-11 | Discharge: 2015-06-11 | Disposition: A | Discharge status: Home / Self Care | Payer: Commercial Managed Care - HMO | Source: Ambulatory Visit | Attending: Oncology | Admitting: Oncology

## 2015-06-11 ENCOUNTER — Other Ambulatory Visit (HOSPITAL_BASED_OUTPATIENT_CLINIC_OR_DEPARTMENT_OTHER): Payer: Commercial Managed Care - HMO

## 2015-06-11 ENCOUNTER — Encounter (HOSPITAL_COMMUNITY)
Admission: RE | Admit: 2015-06-11 | Discharge: 2015-06-11 | Disposition: A | Discharge status: Home / Self Care | Payer: Commercial Managed Care - HMO | Source: Ambulatory Visit | Attending: Oncology | Admitting: Oncology

## 2015-06-11 DIAGNOSIS — C7951 Secondary malignant neoplasm of bone: Secondary | ICD-10-CM | POA: Diagnosis not present

## 2015-06-11 DIAGNOSIS — E119 Type 2 diabetes mellitus without complications: Secondary | ICD-10-CM

## 2015-06-11 DIAGNOSIS — C50919 Malignant neoplasm of unspecified site of unspecified female breast: Secondary | ICD-10-CM | POA: Diagnosis present

## 2015-06-11 DIAGNOSIS — C50912 Malignant neoplasm of unspecified site of left female breast: Secondary | ICD-10-CM | POA: Diagnosis not present

## 2015-06-11 LAB — COMPREHENSIVE METABOLIC PANEL (CC13)
ALT: 34 U/L (ref 0–55)
AST: 68 U/L — ABNORMAL HIGH (ref 5–34)
Albumin: 3.4 g/dL — ABNORMAL LOW (ref 3.5–5.0)
Alkaline Phosphatase: 353 U/L — ABNORMAL HIGH (ref 40–150)
Anion Gap: 9 mEq/L (ref 3–11)
BUN: 10.6 mg/dL (ref 7.0–26.0)
CO2: 24 meq/L (ref 22–29)
Calcium: 10.2 mg/dL (ref 8.4–10.4)
Chloride: 108 mEq/L (ref 98–109)
Creatinine: 0.8 mg/dL (ref 0.6–1.1)
EGFR: 69 mL/min/{1.73_m2} — AB (ref >90)
Glucose: 167 mg/dl — ABNORMAL HIGH (ref 70–140)
POTASSIUM: 4.4 meq/L (ref 3.5–5.1)
Sodium: 141 mEq/L (ref 136–145)
Total Bilirubin: 1 mg/dL (ref 0.20–1.20)
Total Protein: 6.3 g/dL — ABNORMAL LOW (ref 6.4–8.3)

## 2015-06-11 LAB — CBC WITH DIFFERENTIAL/PLATELET
BASO%: 0.7 % (ref 0.0–2.0)
Basophils Absolute: 0 10e3/uL (ref 0.0–0.1)
EOS%: 1.5 % (ref 0.0–7.0)
Eosinophils Absolute: 0.1 10e3/uL (ref 0.0–0.5)
HCT: 24.5 % — ABNORMAL LOW (ref 34.8–46.6)
HGB: 7.8 g/dL — ABNORMAL LOW (ref 11.6–15.9)
LYMPH%: 45.5 % (ref 14.0–49.7)
MCH: 34.8 pg — ABNORMAL HIGH (ref 25.1–34.0)
MCHC: 31.8 g/dL (ref 31.5–36.0)
MCV: 109.4 fL — ABNORMAL HIGH (ref 79.5–101.0)
MONO#: 0.9 10e3/uL (ref 0.1–0.9)
MONO%: 13.8 % (ref 0.0–14.0)
NEUT#: 2.4 10e3/uL (ref 1.5–6.5)
NEUT%: 38.5 % (ref 38.4–76.8)
Platelets: 129 10e3/uL — ABNORMAL LOW (ref 145–400)
RBC: 2.24 10e6/uL — ABNORMAL LOW (ref 3.70–5.45)
RDW: 19.6 % — ABNORMAL HIGH (ref 11.2–14.5)
WBC: 6.2 10e3/uL (ref 3.9–10.3)
lymph#: 2.8 10e3/uL (ref 0.9–3.3)
nRBC: 4 % — ABNORMAL HIGH (ref 0–0)

## 2015-06-11 MED ORDER — TECHNETIUM TC 99M MEDRONATE IV KIT
25.0000 | PACK | Freq: Once | INTRAVENOUS | Status: AC | PRN
  Administered 2015-06-11: 25 via INTRAVENOUS

## 2015-06-12 LAB — CANCER ANTIGEN 27.29: CA 27.29: 826 U/mL — ABNORMAL HIGH (ref 0–39)

## 2015-06-18 ENCOUNTER — Ambulatory Visit (HOSPITAL_BASED_OUTPATIENT_CLINIC_OR_DEPARTMENT_OTHER): Payer: Commercial Managed Care - HMO

## 2015-06-18 ENCOUNTER — Ambulatory Visit (HOSPITAL_COMMUNITY)
Admission: RE | Admit: 2015-06-18 | Discharge: 2015-06-18 | Disposition: A | Discharge status: Home / Self Care | Payer: Commercial Managed Care - HMO | Source: Ambulatory Visit | Attending: Oncology | Admitting: Oncology

## 2015-06-18 ENCOUNTER — Encounter: Payer: Self-pay | Admitting: Nurse Practitioner

## 2015-06-18 ENCOUNTER — Ambulatory Visit (HOSPITAL_BASED_OUTPATIENT_CLINIC_OR_DEPARTMENT_OTHER): Payer: Commercial Managed Care - HMO | Admitting: Nurse Practitioner

## 2015-06-18 ENCOUNTER — Telehealth: Payer: Self-pay | Admitting: Nurse Practitioner

## 2015-06-18 ENCOUNTER — Ambulatory Visit (HOSPITAL_COMMUNITY)
Admission: RE | Admit: 2015-06-18 | Discharge: 2015-06-18 | Disposition: A | Discharge status: Home / Self Care | Payer: Commercial Managed Care - HMO | Source: Ambulatory Visit | Attending: Nurse Practitioner | Admitting: Nurse Practitioner

## 2015-06-18 VITALS — BP 148/50 | HR 82 | Temp 97.7°F | Resp 18 | Ht 64.0 in | Wt 133.1 lb

## 2015-06-18 DIAGNOSIS — Z853 Personal history of malignant neoplasm of breast: Secondary | ICD-10-CM | POA: Insufficient documentation

## 2015-06-18 DIAGNOSIS — M545 Low back pain, unspecified: Secondary | ICD-10-CM

## 2015-06-18 DIAGNOSIS — C50912 Malignant neoplasm of unspecified site of left female breast: Secondary | ICD-10-CM

## 2015-06-18 DIAGNOSIS — D630 Anemia in neoplastic disease: Secondary | ICD-10-CM | POA: Insufficient documentation

## 2015-06-18 DIAGNOSIS — R0602 Shortness of breath: Secondary | ICD-10-CM | POA: Insufficient documentation

## 2015-06-18 DIAGNOSIS — C7951 Secondary malignant neoplasm of bone: Secondary | ICD-10-CM

## 2015-06-18 DIAGNOSIS — C50919 Malignant neoplasm of unspecified site of unspecified female breast: Secondary | ICD-10-CM

## 2015-06-18 DIAGNOSIS — R05 Cough: Secondary | ICD-10-CM | POA: Insufficient documentation

## 2015-06-18 DIAGNOSIS — Z170 Estrogen receptor positive status [ER+]: Secondary | ICD-10-CM | POA: Diagnosis not present

## 2015-06-18 DIAGNOSIS — Z79811 Long term (current) use of aromatase inhibitors: Secondary | ICD-10-CM

## 2015-06-18 DIAGNOSIS — R0781 Pleurodynia: Secondary | ICD-10-CM

## 2015-06-18 DIAGNOSIS — M25551 Pain in right hip: Secondary | ICD-10-CM

## 2015-06-18 LAB — CBC WITH DIFFERENTIAL/PLATELET
BASO%: 0.6 % (ref 0.0–2.0)
BASOS ABS: 0 10*3/uL (ref 0.0–0.1)
EOS%: 0.9 % (ref 0.0–7.0)
Eosinophils Absolute: 0.1 10*3/uL (ref 0.0–0.5)
HCT: 25.5 % — ABNORMAL LOW (ref 34.8–46.6)
HGB: 8.4 g/dL — ABNORMAL LOW (ref 11.6–15.9)
LYMPH%: 42.3 % (ref 14.0–49.7)
MCH: 36.4 pg — AB (ref 25.1–34.0)
MCHC: 32.9 g/dL (ref 31.5–36.0)
MCV: 110.8 fL — AB (ref 79.5–101.0)
MONO#: 0.9 10*3/uL (ref 0.1–0.9)
MONO%: 14.5 % — ABNORMAL HIGH (ref 0.0–14.0)
NEUT#: 2.7 10*3/uL (ref 1.5–6.5)
NEUT%: 41.7 % (ref 38.4–76.8)
PLATELETS: 124 10*3/uL — AB (ref 145–400)
RBC: 2.3 10*6/uL — AB (ref 3.70–5.45)
RDW: 19.9 % — ABNORMAL HIGH (ref 11.2–14.5)
WBC: 6.4 10*3/uL (ref 3.9–10.3)
lymph#: 2.7 10*3/uL (ref 0.9–3.3)

## 2015-06-18 LAB — COMPREHENSIVE METABOLIC PANEL (CC13)
ALBUMIN: 3.6 g/dL (ref 3.5–5.0)
ALT: 28 U/L (ref 0–55)
ANION GAP: 8 meq/L (ref 3–11)
AST: 65 U/L — ABNORMAL HIGH (ref 5–34)
Alkaline Phosphatase: 359 U/L — ABNORMAL HIGH (ref 40–150)
BUN: 13.5 mg/dL (ref 7.0–26.0)
CO2: 26 mEq/L (ref 22–29)
Calcium: 10.2 mg/dL (ref 8.4–10.4)
Chloride: 107 mEq/L (ref 98–109)
Creatinine: 1 mg/dL (ref 0.6–1.1)
EGFR: 57 mL/min/{1.73_m2} — AB (ref >90)
Glucose: 188 mg/dl — ABNORMAL HIGH (ref 70–140)
POTASSIUM: 3.9 meq/L (ref 3.5–5.1)
Sodium: 140 mEq/L (ref 136–145)
Total Bilirubin: 0.98 mg/dL (ref 0.20–1.20)
Total Protein: 6.6 g/dL (ref 6.4–8.3)

## 2015-06-18 LAB — HOLD TUBE, BLOOD BANK

## 2015-06-18 LAB — ABO/RH: ABO/RH(D): O NEG

## 2015-06-18 MED ORDER — SODIUM CHLORIDE 0.9 % IV SOLN
Freq: Once | INTRAVENOUS | Status: AC
  Administered 2015-06-18: 11:00:00 via INTRAVENOUS

## 2015-06-18 MED ORDER — ZOLEDRONIC ACID 4 MG/100ML IV SOLN
4.0000 mg | Freq: Once | INTRAVENOUS | Status: AC
  Administered 2015-06-18: 4 mg via INTRAVENOUS
  Filled 2015-06-18: qty 100

## 2015-06-18 NOTE — Addendum Note (Signed)
Addended by: Marcelino Duster on: 06/18/2015 12:35 PM   Modules accepted: Orders

## 2015-06-18 NOTE — Telephone Encounter (Signed)
Gave avs & calendar for September °

## 2015-06-18 NOTE — Patient Instructions (Signed)

## 2015-06-18 NOTE — Progress Notes (Signed)
ID: Jamie Jamie [name]   DOB: October 07, 1936  MR#: [id number]  PRF#:[id number]  PCP: Jamie Reel, MD GYN: Jamie Farrier, MD SU: OTHER MD: Jamie Shin, MD;  Jamie Edis, MD, Jamie Jamie [name] DDS, Jamie Jamie [name] [name]., Jamie Jamie [name] [name].  CHIEF COMPLAINT:  Metastatic Breast Cancer  CURRENT THERAPY: Exemestane, zolendronate; everolimus    BREAST CANCER HISTORY: From the original intake note:  The patient's cancer was uncovered by an unusually circuitous route. She presented with stomach upset.  Because of Jamie [name] history of colitis, Dr. Teena [name] who happened to be on call for Dr. Cristina [name], obtained CT of the abdomen and pelvis on August 30, 2005.  As far as the abdomen and pelvis were concerned, there was no evidence of colitis but there were multiple scattered sclerotic lesions in the bone suggesting possible sclerotic metastatic disease (versus osteopoikilosis.)  Accordingly Jamie [name] bone scan was obtained.  This was done on September 01, 2005, and found tiny sclerotic lesions, and in particular Jamie [name] lesion of concern in the sternum.  Accordingly Jamie [name] CT scan of the chest was obtained.  This found the sternal problem to correspond with degenerative changes. However, it also showed Jamie [name] soft tissue nodule in the left breast.  The patient then had mammograms on September 27, 2005.  This found two areas of spiculation and architectural distortion in the upper outer quadrant of the left breast.  By ultrasound these were hyperechoic and irregular, highly suspicious for carcinoma.  Biopsy was obtained the same day and showed ([id number], as well as [id number]) invasive lobular carcinoma, with both lesions biopsied being ER and PR positive, both herceptest positive, both negative by FISH.  Accordingly the patient was referred to Dr. Georgette [name] and breast MRI was obtained October 06, 2006.  No abnormal enhancement was noted in the right breast.  There was enhancing nodularity in the upper portion of the left breast where several discrete  nodules were seen.  Accordingly Dr. Georgette [name] performed Jamie [name] left modified radical mastectomy on November 02, 2005.  The patient had Jamie [name] positive sentinel lymph node and Dr. Georgette [name] performed Jamie [name] left axillary lymph node dissection.  However, only three additional lymph nodes were recovered, two of which also showed metastatic involvement (all had extra capsular extension.)    The patient's subsequent history is as detailed below  INTERVAL HISTORY: "Jamie Jamie [name]" returns today for follow up of her metastatic breast carcinoma, alone (her husband preferred to wait in the lobby). She continues on exemestane and everolimus and tolerates these well with occasional hot flashes. At one point over the summer she complained of mucositis but this is not the case today. She is also due for zometa today. The interval history is remarkable for feeling fatigued, weak, and short of breath. Her home health nurse stopped her bystolic and maxide. The patient's blood pressure is 148/50 today. She is forgetful and Jamie [name] poor historian during today's visit.  REVIEW OF SYSTEMS: Jamie Jamie [name] denies fevers, chills, nausea, vomiting, or changes in bowel or bladder habits. She is on 2L O2 continuously and has sats as low as the high 80s/low 90s. She complains of shortness of breath with exertion and cough. Her cough has been no worse over the past 9 months, and she denies phelgm or hemoptysis, but her shortness of breath has been worse. She has occasional headaches and dizzines. She denies pain but she feels poorly constantly. Her appetite is poor. She does not sleep well. Her husband is Jamie [name] stressor at home. Jamie [name] detailed review of systems is  otherwise stable.  PAST MEDICAL HISTORY: Past Medical History  Diagnosis Date  . Anxiety   . Asthma   . Breast cancer     metastasized  . Depression   . Diabetes mellitus   . GERD (gastroesophageal reflux disease)   . Hyperlipidemia   . Internal hemorrhoids   . IBS (irritable bowel syndrome)   . Mitral valve prolapse    . Ischemic colitis   . Fibromyalgia   . Allergy   . Cataract   . S/P radiation therapy within four to twelve weeks 04/26/13-05/11/13    L-spine/sacrum 30Gy/38f  . Colon polyp 22013-02-20   TUBULAR ADENOMA  Significant for diabetes, hypercholesterolemia, asthma, mitral valve prolapse requiring antibiotic prophylaxis before any invasive procedures, Meniere's disease, gastroesophageal reflux disease, osteoarthritis, degenerative disk disease, history of fibromyalgia, panic attacks, history of synovial cyst removal, history of total hysterectomy with bilateral salpingo-oophorectomy, history of septoplasty x2, status post appendectomy, status post dilatation and curettage remotely, status post bilateral carpal tunnel release under Dr. JClair Jamie [name].  PAST SURGICAL HISTORY: Past Surgical History  Procedure Laterality Date  . Mastectomy Left     then treated with chemo and radiation  . Total abdominal hysterectomy    . Back surgery      synovial cyst removed from spine  . Carpal tunnel release Bilateral   . Dilation and curettage of uterus    . Appendectomy    . Cataract extraction Bilateral   . Colonoscopy w/ biopsies  202/20/13 . Esophagogastroduodenoscopy  22008-02-20   normal    FAMILY HISTORY The patient's father died at the age of 757from Jamie [name] stroke.  The patient's mother died at the age of 136 also from Jamie [name] stroke.  The patient has three brothers; one died with "liver disease."  One has prostate cancer.  The other brother has prostate and colon cancer.  The only breast cancer in the family was the patient's mother's mother and she does not know how old her grandmother was when she was diagnosed.  There is no history of ovarian cancer in the family.  GYNECOLOGIC HISTORY: She is GXP2.  She had her hysterectomy while still menstruating and she took hormone replacement until December 2006, when she had her breast biopsy.  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  Their son KChrissie Noahad significant muscular dystrophy and died in 202-20-2008 Their [place].  The patient has two granddaughters, both RNs, one working [place] [facility] [place]    ADVANCED DIRECTIVES: Not in place  HEALTH MAINTENANCE:  (Updated 02/01/2014) Social History  Substance Use Topics  . Smoking status: Never Smoker   . Smokeless tobacco: Never Used  . Alcohol Use: No     Colonoscopy:  July 2013, Dr. BOlevia Perches PAP: s/p hysterectomy  [facility]  Lipid panel: Dr. RVirgina Jock   Allergies  Allergen Reactions  . Hydrocodone Anxiety    Pt. Said she is fine with this medication  . Metformin And Related     GI sxs  . Morphine And Related   . Niacin Other (See Comments)    Dizziness   . Other Other (See Comments)  Salt (results in dizziness)  . Promethazine Hcl Other (See Comments)    Dizziness "FELT CRAZY"     Current Outpatient Prescriptions  Medication Sig Dispense Refill  . acetaminophen (TYLENOL) 500 MG tablet Take 500 mg by mouth every 6 (six) hours as needed for mild pain or moderate pain (pain).     . Calcium Carbonate (CALCIUM 600 PO) Take 1 tablet by mouth daily.    . Cholecalciferol (VITAMIN D) 1000 UNITS capsule Take 1,000 Units by mouth 2 (two) times daily.     . clonazePAM (KLONOPIN) 0.5 MG tablet Take 0.5 mg by mouth 2 (two) times daily.     . CRESTOR 5 MG tablet Take 1 tablet by mouth Daily.    Marland Kitchen esomeprazole (NEXIUM) 40 MG capsule Take 1 capsule (40 mg total) by mouth daily. 90 capsule 3  . everolimus (AFINITOR) 10 MG tablet Take 1 tablet (10 mg total) by mouth daily. 30 tablet 3  . exemestane (AROMASIN) 25 MG tablet Take 1 tablet (25 mg total) by mouth daily after breakfast. 30 tablet 12  .  glimepiride (AMARYL) 1 MG tablet Take 1 mg by mouth as needed (low blood sugar).     . ONGLYZA 5 MG TABS tablet Take 5 mg by mouth daily.     Vladimir Faster Glycol-Propyl Glycol (SYSTANE OP) Apply 1-2 drops to eye daily as needed (dry eyes).     Marland Kitchen PROAIR HFA 108 (90 BASE) MCG/ACT inhaler Inhale 1-2 puffs into the lungs every 6 (six) hours as needed for wheezing or shortness of breath (wheezing).     Marland Kitchen QVAR 80 MCG/ACT inhaler Inhale 2 puffs into the lungs at bedtime.     . traMADol (ULTRAM) 50 MG tablet TAKE 1 TABLET BY MOUTH EVERY 6 HOURS AS NEEDED FOR PAIN 60 tablet 1  . Alum & Mag Hydroxide-Simeth (MAGIC MOUTHWASH) SOLN Take 5 mLs by mouth 3 (three) times daily as needed for mouth pain. (Patient not taking: Reported on 06/18/2015) 60 mL 0  . BYSTOLIC 5 MG tablet Take 1 tablet by mouth Daily.    Marland Kitchen GLYCERIN ADULT 2 G suppository Place 1 suppository rectally daily as needed for mild constipation (constipation).     . hydrochlorothiazide (HYDRODIURIL) 25 MG tablet Take 12.5 mg by mouth every Monday, Wednesday, and Friday.     . hydrocortisone (ANUSOL-HC) 2.5 % rectal cream Place 1 application rectally daily as needed for hemorrhoids or itching.     . lidocaine-hydrocortisone (ANAMANTEL HC) 3-0.5 % CREA Place 1 Applicatorful rectally 2 (two) times daily as needed for hemorrhoids and bleeding. (Patient not taking: Reported on 06/18/2015) 28.35 g 0  . sucralfate (CARAFATE) 1 G tablet Take 1 g by mouth daily as needed ("when stomach is really bad").      No current facility-administered medications for this visit.    OBJECTIVE: Middle-aged white woman using portable oxygen Filed Vitals:   06/18/15 0941  BP: 144/44  Pulse: 82  Temp: 97.7 F (36.5 C)  Resp: 18     Body mass index is 22.84 kg/(m^2).    ECOG FS: 2-3 Filed Weights   06/18/15 0941  Weight: 133 lb 1.6 oz (60.374 kg)   weight April 2016, 138 pounds  Skin: warm, dry  HEENT: sclerae anicteric, conjunctivae pink, oropharynx clear. No  thrush or mucositis.  Lymph Nodes: No cervical or supraclavicular lymphadenopathy  Lungs: clear to auscultation bilaterally, no rales, wheezes, or rhonci  Heart: regular rate and rhythm  Abdomen: round, soft, non tender, positive  bowel sounds  Musculoskeletal: No focal spinal tenderness, no peripheral edema  Neuro: non focal, oriented to person, place, and time, disoriented to sequence of events Breasts: deferre  LAB RESULTS: Lab Results  Component Value Date   WBC 6.2 06/11/2015   NEUTROABS 2.4 06/11/2015   HGB 7.8* 06/11/2015   HCT 24.5* 06/11/2015   MCV 109.4* 06/11/2015   PLT 129* 06/11/2015      Chemistry      Component Value Date/Time   NA 141 06/11/2015 0944   NA 139 02/08/2015 0425   K 4.4 06/11/2015 0944   K 3.8 02/08/2015 0425   CL 105 02/08/2015 0425   CL 103 03/22/2013 0754   CO2 24 06/11/2015 0944   CO2 26 02/08/2015 0425   BUN 10.6 06/11/2015 0944   BUN 16 02/08/2015 0425   CREATININE 0.8 06/11/2015 0944   CREATININE 0.95 02/08/2015 0425      Component Value Date/Time   CALCIUM 10.2 06/11/2015 0944   CALCIUM 9.5 02/08/2015 0425   ALKPHOS 353* 06/11/2015 0944   ALKPHOS 162* 02/06/2015 2204   AST 68* 06/11/2015 0944   AST 80* 02/06/2015 2204   ALT 34 06/11/2015 0944   ALT 36* 02/06/2015 2204   BILITOT 1.00 06/11/2015 0944   BILITOT 0.9 02/06/2015 2204     Results for Jamie [name], Jamie Jamie [name] (MRN [id number]) as of 06/18/2015 11:13  Ref. Range 08/15/2014 10:39 11/21/2014 12:31 01/21/2015 09:47 02/21/2015 13:47 06/11/2015 09:44  CA 27.29 Latest Ref Range: 0-39 U/mL 208 (H) 436 (H) 780 (H) 930 (H) 826 (H)     STUDIES: Nm Bone Scan Whole Body  06/11/2015   CLINICAL DATA:  Breast cancer with bone metastases.  EXAM: NUCLEAR MEDICINE WHOLE BODY BONE SCAN  TECHNIQUE: Whole body anterior and posterior images were obtained approximately 3 hours after intravenous injection of radiopharmaceutical.  RADIOPHARMACEUTICALS:  25.0 mCi Technetium-40mMDP IV  COMPARISON:  02/19/2015   FINDINGS: Multifocal areas of abnormal increased uptake are again identified compatible with the known history of metastatic breast cancer. Areas of increased uptake are noted involving the proximal left femur, left ischial bone, L1 and L2 vertebra, bilateral posterior ribs and left scapula. When compared with the previous exam no new foci of abnormal increased uptake identified to suggest progression of disease. Previously noted focus of increased uptake within the upper thoracic spine has resolved in the interval. Physiologic tracer activity is seen within both kidneys an urinary bladder.  IMPRESSION: 1. Stable to improved appearance of multi focal bone metastases.   Electronically Signed   By: TKerby MoorsM.D.   On: 06/11/2015 15:14    ASSESSMENT:78 y.o.  MIgnacia Palmawoman    (1) status post left modified radical mastectomy in January 2007 for Jamie [name] T1c N1, stage IIA  invasive lobular carcinoma, grade 1, strongly estrogen and progesterone receptor positive, HER-2 negative, with an MIB-1 of 7%.  (2)  Status post radiation given concurrently with Capecitabine.  Declined chemotherapy.    (3) Status post anastrozole and exemestane, both discontinued due to aches and pains.  Status post tamoxifen between October 2007 until August 2011, discontinued secondary to cramps   (4) multiple sclerotic bony lesions noted at initial diagnosis, biopsied x2 August 2009 without malignancy documented, on zoledronic acid annually starting 12/12/2007. Increased to monthly starting 07/17/2013  (5) On fulvestrant between September 2011 and 05/17/2013 with likely progression of bony disease  (6)  radiation to the lumbosacral spine and right hip to treat right hip pain to 30 gray completed 05/11/2013,  complicated by [possible] radiation induced colitis  (7)  on letrozole since September 2014; discontinued May 2016 with progression  (8) received zoledronic acid monthly basis beginning September 2014, after May 2015  switched to denosumab, switched back to zolendronate 05/23/2014 to be given every 12 weeks (most recent dose 06/18/15)  (9) exemestane stated 02/25/15; everolimus added 03/28/2015  PLAN:  Jamie Jamie [name] and I reviewed her bone scan which showed stable to improved bone mets. She will continue on the exemestane and everolimus until there is evidence of disease progression. She will have Jamie [name] chest xray performed today to screen for pneumonitis.   The labs were reviewed and her CA 27.29 is trending back downwards. Her anemia is slowly worse with every visit, and her hgb is down to 7.8. This coupled with her complains of weakness, excessive fatigue, and shortness of breath, would make her eligible for Jamie [name] blood transfusion. She will receive 2 units of blood later this week, and we discussed aranesp every 2 weeks as needed to start on 9/14. The detail and route of administration were explained to the patient, and she is comfortable with this addition to her regimen.   She will continue to hold her maxide and bystolic.   Jamie Jamie [name] understands (after reinforcing plan several times) and agrees with this plan. She knows the goal of treatment in her case is control. She has been encouraged to call with any issues that might arise before her next visit here.  Laurie Panda, NP    06/18/2015

## 2015-06-19 ENCOUNTER — Ambulatory Visit (HOSPITAL_COMMUNITY)
Admission: RE | Admit: 2015-06-19 | Discharge: 2015-06-19 | Disposition: A | Discharge status: Home / Self Care | Payer: Commercial Managed Care - HMO | Source: Ambulatory Visit | Attending: Oncology | Admitting: Oncology

## 2015-06-19 ENCOUNTER — Ambulatory Visit (HOSPITAL_COMMUNITY): Payer: Commercial Managed Care - HMO

## 2015-06-19 DIAGNOSIS — D630 Anemia in neoplastic disease: Secondary | ICD-10-CM | POA: Insufficient documentation

## 2015-06-19 LAB — PREPARE RBC (CROSSMATCH)

## 2015-06-20 ENCOUNTER — Ambulatory Visit (HOSPITAL_BASED_OUTPATIENT_CLINIC_OR_DEPARTMENT_OTHER): Payer: Commercial Managed Care - HMO

## 2015-06-20 ENCOUNTER — Other Ambulatory Visit: Payer: Self-pay | Admitting: Nurse Practitioner

## 2015-06-20 VITALS — BP 135/42 | HR 76 | Temp 98.1°F | Resp 18

## 2015-06-20 DIAGNOSIS — D630 Anemia in neoplastic disease: Secondary | ICD-10-CM

## 2015-06-20 MED ORDER — SODIUM CHLORIDE 0.9 % IV SOLN
250.0000 mL | Freq: Once | INTRAVENOUS | Status: AC
  Administered 2015-06-20: 250 mL via INTRAVENOUS

## 2015-06-20 MED ORDER — DIPHENHYDRAMINE HCL 25 MG PO CAPS
25.0000 mg | ORAL_CAPSULE | Freq: Once | ORAL | Status: AC
  Administered 2015-06-20: 25 mg via ORAL

## 2015-06-20 MED ORDER — DIPHENHYDRAMINE HCL 25 MG PO CAPS
ORAL_CAPSULE | ORAL | Status: AC
  Filled 2015-06-20: qty 1

## 2015-06-20 MED ORDER — ACETAMINOPHEN 325 MG PO TABS
650.0000 mg | ORAL_TABLET | Freq: Once | ORAL | Status: AC
  Administered 2015-06-20: 650 mg via ORAL

## 2015-06-20 MED ORDER — ACETAMINOPHEN 325 MG PO TABS
ORAL_TABLET | ORAL | Status: AC
  Filled 2015-06-20: qty 2

## 2015-06-20 NOTE — Patient Instructions (Signed)

## 2015-06-20 NOTE — Progress Notes (Signed)
1130 Hgb 06/18/15 8.4; per Gentry Fitz, NP transfuse 1 unit only today.

## 2015-06-22 LAB — TYPE AND SCREEN
ABO/RH(D): O NEG
ANTIBODY SCREEN: NEGATIVE
UNIT DIVISION: 0
Unit division: 0

## 2015-06-23 ENCOUNTER — Encounter (HOSPITAL_COMMUNITY): Payer: Self-pay | Admitting: *Deleted

## 2015-06-23 ENCOUNTER — Other Ambulatory Visit: Payer: Self-pay

## 2015-06-23 ENCOUNTER — Emergency Department (HOSPITAL_COMMUNITY): Payer: Commercial Managed Care - HMO

## 2015-06-23 ENCOUNTER — Emergency Department (HOSPITAL_COMMUNITY)
Admission: EM | Admit: 2015-06-23 | Discharge: 2015-06-23 | Disposition: A | Discharge status: Home / Self Care | Payer: Commercial Managed Care - HMO | Attending: Emergency Medicine | Admitting: Emergency Medicine

## 2015-06-23 DIAGNOSIS — E119 Type 2 diabetes mellitus without complications: Secondary | ICD-10-CM | POA: Diagnosis not present

## 2015-06-23 DIAGNOSIS — F419 Anxiety disorder, unspecified: Secondary | ICD-10-CM | POA: Insufficient documentation

## 2015-06-23 DIAGNOSIS — Z79899 Other long term (current) drug therapy: Secondary | ICD-10-CM | POA: Diagnosis not present

## 2015-06-23 DIAGNOSIS — J45909 Unspecified asthma, uncomplicated: Secondary | ICD-10-CM | POA: Diagnosis not present

## 2015-06-23 DIAGNOSIS — E785 Hyperlipidemia, unspecified: Secondary | ICD-10-CM | POA: Diagnosis not present

## 2015-06-23 DIAGNOSIS — Z86010 Personal history of colonic polyps: Secondary | ICD-10-CM | POA: Diagnosis not present

## 2015-06-23 DIAGNOSIS — K219 Gastro-esophageal reflux disease without esophagitis: Secondary | ICD-10-CM | POA: Diagnosis not present

## 2015-06-23 DIAGNOSIS — Z853 Personal history of malignant neoplasm of breast: Secondary | ICD-10-CM | POA: Diagnosis not present

## 2015-06-23 DIAGNOSIS — R079 Chest pain, unspecified: Secondary | ICD-10-CM | POA: Diagnosis present

## 2015-06-23 DIAGNOSIS — F329 Major depressive disorder, single episode, unspecified: Secondary | ICD-10-CM | POA: Diagnosis not present

## 2015-06-23 LAB — CBC WITH DIFFERENTIAL/PLATELET
BASOS ABS: 0 10*3/uL (ref 0.0–0.1)
Basophils Relative: 0 % (ref 0–1)
Eosinophils Absolute: 0 10*3/uL (ref 0.0–0.7)
Eosinophils Relative: 0 % (ref 0–5)
HEMATOCRIT: 31.2 % — AB (ref 36.0–46.0)
Hemoglobin: 10.3 g/dL — ABNORMAL LOW (ref 12.0–15.0)
LYMPHS ABS: 3.8 10*3/uL (ref 0.7–4.0)
Lymphocytes Relative: 32 % (ref 12–46)
MCH: 33.3 pg (ref 26.0–34.0)
MCHC: 33 g/dL (ref 30.0–36.0)
MCV: 101 fL — ABNORMAL HIGH (ref 78.0–100.0)
MONOS PCT: 9 % (ref 3–12)
Monocytes Absolute: 1.1 10*3/uL — ABNORMAL HIGH (ref 0.1–1.0)
Neutro Abs: 6.9 10*3/uL (ref 1.7–7.7)
Neutrophils Relative %: 59 % (ref 43–77)
PLATELETS: 118 10*3/uL — AB (ref 150–400)
RBC: 3.09 MIL/uL — AB (ref 3.87–5.11)
RDW: 25.7 % — AB (ref 11.5–15.5)
WBC: 11.8 10*3/uL — AB (ref 4.0–10.5)

## 2015-06-23 LAB — BASIC METABOLIC PANEL
ANION GAP: 9 (ref 5–15)
BUN: 7 mg/dL (ref 6–20)
CO2: 23 mmol/L (ref 22–32)
Calcium: 9.7 mg/dL (ref 8.9–10.3)
Chloride: 106 mmol/L (ref 101–111)
Creatinine, Ser: 0.75 mg/dL (ref 0.44–1.00)
Glucose, Bld: 167 mg/dL — ABNORMAL HIGH (ref 65–99)
POTASSIUM: 4 mmol/L (ref 3.5–5.1)
SODIUM: 138 mmol/L (ref 135–145)

## 2015-06-23 LAB — I-STAT TROPONIN, ED: Troponin i, poc: 0 ng/mL (ref 0.00–0.08)

## 2015-06-23 LAB — BRAIN NATRIURETIC PEPTIDE: B NATRIURETIC PEPTIDE 5: 186.5 pg/mL — AB (ref 0.0–100.0)

## 2015-06-23 MED ORDER — IOHEXOL 350 MG/ML SOLN
80.0000 mL | Freq: Once | INTRAVENOUS | Status: AC | PRN
  Administered 2015-06-23: 80 mL via INTRAVENOUS

## 2015-06-23 MED ORDER — CLONAZEPAM 0.5 MG PO TABS
0.5000 mg | ORAL_TABLET | Freq: Once | ORAL | Status: AC
  Administered 2015-06-23: 0.5 mg via ORAL
  Filled 2015-06-23: qty 1

## 2015-06-23 MED ORDER — LORAZEPAM 1 MG PO TABS
1.0000 mg | ORAL_TABLET | Freq: Once | ORAL | Status: AC
  Administered 2015-06-23: 1 mg via ORAL
  Filled 2015-06-23: qty 1

## 2015-06-23 NOTE — ED Provider Notes (Signed)
CSN: [id number]     Arrival date & time 06/23/15  9476 History   First MD Initiated Contact with Patient 06/23/15 [phone]     Chief Complaint  Patient presents with  . Chest Pain     (Consider location/radiation/quality/duration/timing/severity/associated sxs/prior Treatment) HPI Jamie [name] is a 79 y.o. female with history of metastatic breast cancer, diabetes, anxiety, comes in for evaluation of neck pain. Patient reports she is very anxious, feels like her home life is contributing to her anxiety, specifically her husband "is always fussing at me". She does report she feels safe, but is upset. She reports trying to get off the couch this morning after sleeping at approximately 1:00 AM and at that point strained her neck. She reports this discomfort has improved over time and now only her neck feels sore. She denies any fevers, chills, chest pain, shortness of breath, numbness or weakness, vision changes. Recently seen at [facility] for blood transfusion secondary to anemia.  Past Medical History  Diagnosis Date  . Anxiety   . Asthma   . Breast cancer     metastasized  . Depression   . Diabetes mellitus   . GERD (gastroesophageal reflux disease)   . Hyperlipidemia   . Internal hemorrhoids   . IBS (irritable bowel syndrome)   . Mitral valve prolapse   . Ischemic colitis   . Fibromyalgia   . Allergy   . Cataract   . S/P radiation therapy within four to twelve weeks 04/26/13-05/11/13    L-spine/sacrum 30Gy/48fx  . Colon polyp 2013    TUBULAR ADENOMA   Past Surgical History  Procedure Laterality Date  . Mastectomy Left     then treated with chemo and radiation  . Total abdominal hysterectomy    . Back surgery      synovial cyst removed from spine  . Carpal tunnel release Bilateral   . Dilation and curettage of uterus    . Appendectomy    . Cataract extraction Bilateral   . Colonoscopy w/ biopsies  2013  . Esophagogastroduodenoscopy  2008    normal    Family History  Problem Relation Age of Onset  . Prostate cancer Brother     x2  . Irritable bowel syndrome Mother   . Colon cancer Neg Hx   . Stroke Father    Social History  Substance Use Topics  . Smoking status: Never Smoker   . Smokeless tobacco: Never Used  . Alcohol Use: No   OB History    No data available     Review of Systems A 10 point review of systems was completed and was negative except for pertinent positives and negatives as mentioned in the history of present illness     Allergies  Hydrocodone; Metformin and related; Morphine and related; Niacin; Other; and Promethazine hcl  Home Medications   Prior to Admission medications   Medication Sig Start Date End Date Taking? Authorizing Provider  acetaminophen (TYLENOL) 500 MG tablet Take 500 mg by mouth every 6 (six) hours as needed for mild pain or moderate pain (pain).    Yes Historical Provider, MD  Calcium Carbonate (CALCIUM 600 PO) Take 600 mg by mouth 3 (three) times a week.    Yes Historical Provider, MD  Cholecalciferol (VITAMIN D) 1000 UNITS capsule Take 1,000 Units by mouth daily.    Yes Historical Provider, MD  clonazePAM (KLONOPIN) 0.5 MG tablet Take 0.5 mg by mouth 2 (two) times daily.    Yes Historical  Provider, MD  everolimus (AFINITOR) 10 MG tablet Take 1 tablet (10 mg total) by mouth daily. 03/14/15  Yes Laurie Panda, NP  exemestane (AROMASIN) 25 MG tablet Take 1 tablet (25 mg total) by mouth daily after breakfast. 02/21/15  Yes Chauncey Cruel, MD  glimepiride (AMARYL) 1 MG tablet Take 1 mg by mouth daily.    Yes Historical Provider, MD  GLYCERIN ADULT 2 G suppository Place 1 suppository rectally daily as needed for mild constipation (constipation).  01/21/14  Yes Historical Provider, MD  hydrochlorothiazide (HYDRODIURIL) 25 MG tablet Take 12.5 mg by mouth every Monday, Wednesday, and Friday.    Yes Historical Provider, MD  hydrocortisone (ANUSOL-HC) 2.5 % rectal cream Place 1 application  rectally daily as needed for hemorrhoids or itching.  05/18/13  Yes Shon Baton, MD  ONGLYZA 5 MG TABS tablet Take 5 mg by mouth daily.  02/24/12  Yes Historical Provider, MD  Polyethyl Glycol-Propyl Glycol (SYSTANE OP) Apply 1-2 drops to eye daily as needed (dry eyes).    Yes Historical Provider, MD  PROAIR HFA 108 (90 BASE) MCG/ACT inhaler Inhale 1-2 puffs into the lungs every 6 (six) hours as needed for wheezing or shortness of breath (wheezing).  05/30/12  Yes Historical Provider, MD  QVAR 80 MCG/ACT inhaler Inhale 2 puffs into the lungs at bedtime.  09/22/14  Yes Historical Provider, MD  sucralfate (CARAFATE) 1 G tablet Take 1 g by mouth daily as needed ("when stomach is really bad").    Yes Historical Provider, MD  traMADol (ULTRAM) 50 MG tablet TAKE 1 TABLET BY MOUTH EVERY 6 HOURS AS NEEDED FOR PAIN Patient taking differently: TAKE 50 MG BY MOUTH EVERY 6 HOURS AS NEEDED FOR PAIN 03/18/15  Yes Chauncey Cruel, MD  Alum & Mag Hydroxide-Simeth (MAGIC MOUTHWASH) SOLN Take 5 mLs by mouth 3 (three) times daily as needed for mouth pain. Patient not taking: Reported on 06/18/2015 04/05/15   Leo Grosser, MD  esomeprazole (NEXIUM) 40 MG capsule Take 1 capsule (40 mg total) by mouth daily. Patient not taking: Reported on 06/23/2015 09/25/14   Laban Emperor Zehr, PA-C  lidocaine-hydrocortisone (ANAMANTEL HC) 3-0.5 % CREA Place 1 Applicatorful rectally 2 (two) times daily as needed for hemorrhoids and bleeding. Patient not taking: Reported on 06/23/2015 05/17/13   Shon Baton, MD   BP 151/53 mmHg  Pulse 109  Resp 16  Ht 5\' 4"  (1.626 m)  Wt 133 lb (60.328 kg)  BMI 22.82 kg/m2  SpO2 99% Physical Exam  Constitutional: She is oriented to person, place, and time. She appears well-developed and well-nourished.  Patient appears frail. She is tearful and appears anxious  HENT:  Head: Normocephalic and atraumatic.  Mouth/Throat: Oropharynx is clear and moist.  Eyes: Conjunctivae are normal. Pupils are equal, round, and  reactive to light. Right eye exhibits no discharge. Left eye exhibits no discharge. No scleral icterus.  Neck: Normal range of motion. Neck supple.  No focal neck pain, no tenderness to palpation. No obvious lesions or deformities  Cardiovascular: Normal rate, regular rhythm and normal heart sounds.   Pulmonary/Chest: Effort normal and breath sounds normal. No respiratory distress. She has no wheezes. She has no rales.  Abdominal: Soft. There is no tenderness.  Musculoskeletal: She exhibits no tenderness.  Neurological: She is alert and oriented to person, place, and time.  Cranial Nerves II-XII grossly intact  Skin: Skin is warm and dry. No rash noted.  Psychiatric: She has a normal mood and affect.  Nursing note  and vitals reviewed.   ED Course  Procedures (including critical care time) Labs Review Labs Reviewed  BASIC METABOLIC PANEL - Abnormal; Notable for the following:    Glucose, Bld 167 (*)    All other components within normal limits  CBC WITH DIFFERENTIAL/PLATELET - Abnormal; Notable for the following:    WBC 11.8 (*)    RBC 3.09 (*)    Hemoglobin 10.3 (*)    HCT 31.2 (*)    MCV 101.0 (*)    RDW 25.7 (*)    Platelets 118 (*)    Monocytes Absolute 1.1 (*)    All other components within normal limits  BRAIN NATRIURETIC PEPTIDE - Abnormal; Notable for the following:    B Natriuretic Peptide 186.5 (*)    All other components within normal limits  I-STAT TROPOININ, ED    Imaging Review Dg Chest 2 View  06/23/2015   CLINICAL DATA:  Shortness of breath. Fall today. History of left breast cancer, prior mastectomy.  EXAM: CHEST  2 VIEW  COMPARISON:  06/18/2015  FINDINGS: Patchy bony sclerosis compatible with osseous metastatic disease.  Mild enlargement of the cardiopericardial silhouette. Faint density in the left upper lobe as shown on prior CT scan, probably from radiation fibrosis.  Density projecting of the left mid lung is believed to represent an inferior scapular tip  metastatic lesion.  Faint interstitial accentuation in the lungs.  IMPRESSION: 1. Diffuse osseous metastatic disease. 2. Mild enlargement of the cardiopericardial silhouette, without overt edema. 3. Faint interstitial accentuation in the lungs, chronic.   Electronically Signed   By: Van Clines [name].   On: 06/23/2015 11:51   Ct Angio Chest Pe W/cm &/or Wo Cm  06/23/2015   CLINICAL DATA:  79 year old female with acute onset of shortness of breath today, with some associated neck pain that radiates down into the epigastric region.  EXAM: CT ANGIOGRAPHY CHEST WITH CONTRAST  TECHNIQUE: Multidetector CT imaging of the chest was performed using the standard protocol during bolus administration of intravenous contrast. Multiplanar CT image reconstructions and MIPs were obtained to evaluate the vascular anatomy.  CONTRAST:  9mL OMNIPAQUE IOHEXOL 350 MG/ML SOLN  COMPARISON:  Chest CT 02/07/2015.  FINDINGS: Mediastinum/Lymph Nodes: There are no filling defects within the pulmonary arterial tree to suggest underlying pulmonary embolism. Heart size is normal. There is no significant pericardial fluid, thickening or pericardial calcification. No pathologically enlarged mediastinal, internal mammary or hilar lymph nodes. Multiple prominent but nonenlarged mediastinal lymph nodes are noted, nonspecific. Esophagus is unremarkable in appearance. No axillary lymphadenopathy. Status post left axillary lymph node dissection.  Lungs/Pleura: Several areas of scarring are noted throughout the lungs bilaterally, similar to the prior study, most notably in the medial segment of the right middle lobe. No acute consolidative airspace disease. No pleural effusions. Ground-glass attenuation, septal thickening and subpleural reticulation in the left upper lobe, particularly in the left apex, presumably from prior radiation therapy, similar to the prior study.  Upper Abdomen: Unremarkable.  Musculoskeletal/Soft Tissues: Status post left  modified radical mastectomy. Widespread predominantly sclerotic osseous lesions throughout the visualized axial and appendicular skeleton, compatible with widespread metastatic disease. Burden of metastatic disease appears very similar to prior study 02/07/2015.  Review of the MIP images confirms the above findings.  IMPRESSION: 1. No pulmonary embolism. 2. No acute findings in the thorax to account for the patient's symptoms. 3. Status post left modified radical mastectomy, left axillary nodal dissection and radiation therapy, with stable postoperative changes, as above. Widespread metastatic disease  to the bones appear similar to the prior examination. 4. Additional incidental findings, as above.   Electronically Signed   By: Vinnie Langton [name].   On: 06/23/2015 13:27   I have personally reviewed and evaluated these images and lab results as part of my medical decision-making.   EKG Interpretation   Date/Time:  Monday June 23 2015 10:42:09 EDT Ventricular Rate:  110 PR Interval:    QRS Duration: 88 QT Interval:  332 QTC Calculation: 449 R Axis:   48 Text Interpretation:  Sinus tachycardia Probable LVH with secondary repol  abnrm Confirmed by Hazle Coca [phone]) on 06/23/2015 11:29:05 AM     Meds given in ED:  Medications  LORazepam (ATIVAN) tablet 1 mg (1 mg Oral Given 06/23/15 1057)  iohexol (OMNIPAQUE) 350 MG/ML injection 80 mL (80 mLs Intravenous Contrast Given 06/23/15 1226)  clonazePAM (KLONOPIN) tablet 0.5 mg (0.5 mg Oral Given 06/23/15 1352)    Discharge Medication List as of 06/23/2015  1:50 PM     Filed Vitals:   06/23/15 1300 06/23/15 1315 06/23/15 1330 06/23/15 1345  BP: 156/54 151/53    Pulse: 107 110 115 109  Resp: 21 25 25 16   Height:      Weight:      SpO2: 96% 97% 96% 99%    MDM  Patient with metastatic breast cancer here for evaluation of neck pain.  Vitals stable , mild tachycardia -afebrile Pt resting comfortably in ED. suspect tachycardia is secondary to  anxiety. PE--benign lung exam. Neck exam consistent with musculoskeletal pain. Physical exam is otherwise not concerning. Labwork-leukocytosis of 11.8, slightly elevated from 6.45 days ago, BNP elevated at 185 up from 119 four months ago. EKG without any ischemic changes or concerning arrhythmias. Troponin negative Imaging--chest x-ray shows diffuse osseous metastatic disease, no edema, chronic interstitial accentuation in the lungs. CT angiogram of chest shows no pulmonary embolism, no other acute findings.  DDX--no evidence of fluid overload. No evidence of pulmonary embolus or other acute cardiopulmonary pathology. Not consistent with ACS. No evidence of other vascular compromise or carotid/vertebral artery dissections. Neck discomfort likely secondary to MSK. Patient symptoms likely secondary to anxiety. Discussed follow-up with primary care for reevaluation and patient agrees with this plan. No other acute or emergent pathology at this time.  I discussed all relevant lab findings and imaging results with pt and they verbalized understanding. Discussed f/u with PCP within 48 hrs and return precautions, pt very amenable to plan. Prior to patient discharge, I discussed and reviewed this case with Dr. Ralene Bathe, who also saw and evaluated the patient.   Final diagnoses:  Anxiety        Comer Locket, PA-C 06/24/15 [place], MD 06/24/15 [phone]

## 2015-06-23 NOTE — ED Notes (Signed)
Spoke with husband, he's coming to p/u pt

## 2015-06-23 NOTE — Discharge Instructions (Signed)
You were evaluated in the ED today and there does not appear to be an emergent cause her symptoms at this time. Ear exam, labs and CT imaging of your chest are all reassuring. There are no new problems detected. Please follow-up with your doctor as needed for reevaluation. Return to ED for new or worsening symptoms.

## 2015-06-23 NOTE — ED Notes (Signed)
Patient transported back from CT 

## 2015-06-23 NOTE — ED Notes (Signed)
Pt in from home c/o neck pain that radiates down to epigastric area, pt reports being extremely upset with hx of anxiety, pt c/o SOB onset today, pt hx of chronic SOB, pt denies N/V/D, pt A&O x4, follows commands, speaks in complete sentences, pt receives Zometa inj, last administered Friday, pt reports getting a blood transfusion on Friday at [facility]

## 2015-06-24 ENCOUNTER — Telehealth: Payer: Self-pay | Admitting: *Deleted

## 2015-06-24 NOTE — Telephone Encounter (Signed)
This RN returned call to [employer] with [facility].  Per discussion- when pt was admitted for services per primary MD - pt stated she was not receiving any care per this office.  Noted recent visits with medication administration- inquiry is related to what services are being provided by this office.  This RN informed Deidre pt is receiving aranesp for non cancer related anemia and Xgeva for known bone mets. Both medications are not chemotherapy and does not treat the cancer directly but alleviate symptoms and can be considered pallative.  Deidre states pt is having increased confusion including understanding what her diagnosis is and ongoing issue with medication in home.  Deidre inquired about confusion from " her brain mets ".  Pt's diagnosis and current known status of " bone only " disease since last staging in May 2016.  Per discussion Deidre states concerns regarding pt's home enviroment which they are attempting to assist including obtaining care at an assisted living.   Pt does not want to be admitted to assisted living unless Clair Gulling goes with her.  Plan per clarifying pt's diagnosis and prognosis per breast cancer  as well other issues difficult home enviroment and pyschosocial concerns is Deidre will follow up with her manager and social worker regarding care.  Deidre will call this RN or our social work department if services

## 2015-07-02 ENCOUNTER — Other Ambulatory Visit: Payer: Self-pay

## 2015-07-02 ENCOUNTER — Ambulatory Visit: Payer: Self-pay

## 2015-07-16 ENCOUNTER — Ambulatory Visit: Payer: Self-pay | Admitting: Nurse Practitioner

## 2015-07-16 ENCOUNTER — Other Ambulatory Visit: Payer: Self-pay

## 2015-07-16 ENCOUNTER — Ambulatory Visit: Payer: Self-pay

## 2015-07-17 ENCOUNTER — Telehealth: Payer: Self-pay | Admitting: *Deleted

## 2015-07-17 NOTE — Telephone Encounter (Signed)
Called pt to inquire about missed appt on yesterday 07/16/15. No answer, but left a detailed message for pt to call this nurse back @ [phone]. If I don't hear from pt I will continue to call till I reach her. Message to be fwd to Gentry Fitz, NP.

## 2015-07-30 ENCOUNTER — Ambulatory Visit: Payer: Self-pay

## 2015-07-30 ENCOUNTER — Other Ambulatory Visit: Payer: Self-pay

## 2015-08-13 ENCOUNTER — Ambulatory Visit: Payer: Self-pay | Admitting: Nurse Practitioner

## 2015-08-13 ENCOUNTER — Other Ambulatory Visit: Payer: Self-pay

## 2015-08-13 ENCOUNTER — Ambulatory Visit: Payer: Self-pay

## 2015-08-13 ENCOUNTER — Telehealth: Payer: Self-pay | Admitting: *Deleted

## 2015-08-13 NOTE — Telephone Encounter (Signed)
Called pt to inquire why she did not come to appt this am. Pt told me that she didn't know she had an appt today, then she said her husband doesn't want her to come anymore b/c of the cost. I asked pt how was she doing. Pt said she has felt " pretty good lately". Still using her oxygen but on yesterday she realized after being out, she did not have it on and was never SOB. Pt has had her usual nosebleeds about twice a week. She's been using a OTC nasal spray for dryness to her nares. Pt told me she had been constipated but still taking Miralax but results are sporadic. I suggested that she add 2-3 stool softeners daily to see if this helps. She took a glycerin suppository last night and had good results this am. I told her if any issues arises to give [place] a call. Pt verbalized understanding. No further concerns.Message to be fwd to H. Boelter, NP.

## 2015-09-10 ENCOUNTER — Ambulatory Visit: Payer: Self-pay | Admitting: Oncology

## 2015-09-10 ENCOUNTER — Other Ambulatory Visit: Payer: Self-pay

## 2015-09-13 ENCOUNTER — Encounter: Payer: Self-pay | Admitting: Oncology

## 2015-10-20 ENCOUNTER — Other Ambulatory Visit: Payer: Self-pay | Admitting: Oncology

## 2015-10-20 ENCOUNTER — Encounter: Payer: Self-pay | Admitting: Oncology

## 2015-10-28 ENCOUNTER — Emergency Department (HOSPITAL_COMMUNITY): Payer: Medicare Other

## 2015-10-28 ENCOUNTER — Emergency Department (HOSPITAL_COMMUNITY)
Admission: EM | Admit: 2015-10-28 | Discharge: 2015-10-28 | Disposition: A | Discharge status: Home / Self Care | Payer: Medicare Other | Attending: Emergency Medicine | Admitting: Emergency Medicine

## 2015-10-28 ENCOUNTER — Encounter (HOSPITAL_COMMUNITY): Payer: Self-pay | Admitting: Emergency Medicine

## 2015-10-28 DIAGNOSIS — R059 Cough, unspecified: Secondary | ICD-10-CM

## 2015-10-28 DIAGNOSIS — R05 Cough: Secondary | ICD-10-CM | POA: Diagnosis not present

## 2015-10-28 DIAGNOSIS — J45909 Unspecified asthma, uncomplicated: Secondary | ICD-10-CM | POA: Diagnosis not present

## 2015-10-28 DIAGNOSIS — D649 Anemia, unspecified: Secondary | ICD-10-CM | POA: Insufficient documentation

## 2015-10-28 DIAGNOSIS — F419 Anxiety disorder, unspecified: Secondary | ICD-10-CM | POA: Diagnosis not present

## 2015-10-28 DIAGNOSIS — E119 Type 2 diabetes mellitus without complications: Secondary | ICD-10-CM | POA: Diagnosis not present

## 2015-10-28 DIAGNOSIS — Z8679 Personal history of other diseases of the circulatory system: Secondary | ICD-10-CM | POA: Diagnosis not present

## 2015-10-28 DIAGNOSIS — Z86010 Personal history of colonic polyps: Secondary | ICD-10-CM | POA: Insufficient documentation

## 2015-10-28 DIAGNOSIS — Z853 Personal history of malignant neoplasm of breast: Secondary | ICD-10-CM | POA: Diagnosis not present

## 2015-10-28 DIAGNOSIS — Z79899 Other long term (current) drug therapy: Secondary | ICD-10-CM | POA: Insufficient documentation

## 2015-10-28 DIAGNOSIS — F329 Major depressive disorder, single episode, unspecified: Secondary | ICD-10-CM | POA: Insufficient documentation

## 2015-10-28 DIAGNOSIS — Z7951 Long term (current) use of inhaled steroids: Secondary | ICD-10-CM | POA: Insufficient documentation

## 2015-10-28 DIAGNOSIS — Z8669 Personal history of other diseases of the nervous system and sense organs: Secondary | ICD-10-CM | POA: Insufficient documentation

## 2015-10-28 DIAGNOSIS — K219 Gastro-esophageal reflux disease without esophagitis: Secondary | ICD-10-CM | POA: Diagnosis not present

## 2015-10-28 LAB — CBC
HCT: 26.2 % — ABNORMAL LOW (ref 36.0–46.0)
HEMOGLOBIN: 8.6 g/dL — AB (ref 12.0–15.0)
MCH: 36.4 pg — AB (ref 26.0–34.0)
MCHC: 32.8 g/dL (ref 30.0–36.0)
MCV: 111 fL — ABNORMAL HIGH (ref 78.0–100.0)
Platelets: 163 10*3/uL (ref 150–400)
RBC: 2.36 MIL/uL — ABNORMAL LOW (ref 3.87–5.11)
RDW: 19.5 % — ABNORMAL HIGH (ref 11.5–15.5)
WBC: 5.7 10*3/uL (ref 4.0–10.5)

## 2015-10-28 LAB — COMPREHENSIVE METABOLIC PANEL
ALK PHOS: 360 U/L — AB (ref 38–126)
ALT: 22 U/L (ref 14–54)
AST: 72 U/L — ABNORMAL HIGH (ref 15–41)
Albumin: 3.3 g/dL — ABNORMAL LOW (ref 3.5–5.0)
Anion gap: 10 (ref 5–15)
BUN: 8 mg/dL (ref 6–20)
CALCIUM: 9.3 mg/dL (ref 8.9–10.3)
CO2: 24 mmol/L (ref 22–32)
CREATININE: 0.62 mg/dL (ref 0.44–1.00)
Chloride: 108 mmol/L (ref 101–111)
Glucose, Bld: 153 mg/dL — ABNORMAL HIGH (ref 65–99)
Potassium: 3.6 mmol/L (ref 3.5–5.1)
Sodium: 142 mmol/L (ref 135–145)
Total Bilirubin: 2.2 mg/dL — ABNORMAL HIGH (ref 0.3–1.2)
Total Protein: 6 g/dL — ABNORMAL LOW (ref 6.5–8.1)

## 2015-10-28 LAB — FOLATE: FOLATE: 24.5 ng/mL (ref >5.9)

## 2015-10-28 LAB — RETICULOCYTES
RBC.: 2.35 MIL/uL — ABNORMAL LOW (ref 3.87–5.11)
Retic Count, Absolute: 157.5 10*3/uL (ref 19.0–186.0)
Retic Ct Pct: 6.7 % — ABNORMAL HIGH (ref 0.4–3.1)

## 2015-10-28 LAB — TROPONIN I: TROPONIN I: 0.03 ng/mL (ref <0.031)

## 2015-10-28 MED ORDER — IPRATROPIUM BROMIDE 0.02 % IN SOLN
0.5000 mg | Freq: Once | RESPIRATORY_TRACT | Status: AC
  Administered 2015-10-28: 0.5 mg via RESPIRATORY_TRACT
  Filled 2015-10-28: qty 2.5

## 2015-10-28 MED ORDER — ALBUTEROL SULFATE (2.5 MG/3ML) 0.083% IN NEBU
5.0000 mg | INHALATION_SOLUTION | Freq: Once | RESPIRATORY_TRACT | Status: AC
  Administered 2015-10-28: 5 mg via RESPIRATORY_TRACT
  Filled 2015-10-28: qty 6

## 2015-10-28 MED ORDER — GUAIFENESIN 100 MG/5ML PO SOLN
10.0000 mL | Freq: Once | ORAL | Status: AC
  Administered 2015-10-28: 200 mg via ORAL
  Filled 2015-10-28: qty 10

## 2015-10-28 NOTE — Discharge Instructions (Signed)
It was our pleasure to provide your ER care today - we hope that you feel better. ° °For cough, you may try robitussin or mucinex as need. ° °From today's labs, you continue to have anemia/low blood count (hemoglobin 8.6) - we sent an anemia panel, the results of which remain pending - follow up with your primary care doctor in the coming week, have them review today's lab results. ° °Also from the lab tests, your liver function tests are elevated, similar to prior (AST 72, Alk Phos 360, bilirubin 2.2) - also discuss these results with your doctor at follow up. ° °For chronic/recurrent cough, also discuss treatment plan with your doctor. ° °Return to ER if worse, new symptoms, fevers, increasing difficulty breathing, chest pain, medical emergency, other concern. ° ° °Cough, Adult °Coughing is a reflex that clears your throat and your airways. Coughing helps to heal and protect your lungs. It is normal to cough occasionally, but a cough that happens with other symptoms or lasts a long time may be a sign of a condition that needs treatment. A cough may last only 2-3 weeks (acute), or it may last longer than 8 weeks (chronic). °CAUSES °Coughing is commonly caused by: °· Breathing in substances that irritate your lungs. °· A viral or bacterial respiratory infection. °· Allergies. °· Asthma. °· Postnasal drip. °· Smoking. °· Acid backing up from the stomach into the esophagus (gastroesophageal reflux). °· Certain medicines. °· Chronic lung problems, including COPD (or rarely, lung cancer). °· Other medical conditions such as heart failure. °HOME CARE INSTRUCTIONS  °Pay attention to any changes in your symptoms. Take these actions to help with your discomfort: °· Take medicines only as told by your health care provider. °· If you were prescribed an antibiotic medicine, take it as told by your health care provider. Do not stop taking the antibiotic even if you start to feel better. °· Talk with your health care provider  before you take a cough suppressant medicine. °· Drink enough fluid to keep your urine clear or pale yellow. °· If the air is dry, use a cold steam vaporizer or humidifier in your bedroom or your home to help loosen secretions. °· Avoid anything that causes you to cough at work or at home. °· If your cough is worse at night, try sleeping in a semi-upright position. °· Avoid cigarette smoke. If you smoke, quit smoking. If you need help quitting, ask your health care provider. °· Avoid caffeine. °· Avoid alcohol. °· Rest as needed. °SEEK MEDICAL CARE IF:  °· You have new symptoms. °· You cough up pus. °· Your cough does not get better after 2-3 weeks, or your cough gets worse. °· You cannot control your cough with suppressant medicines and you are losing sleep. °· You develop pain that is getting worse or pain that is not controlled with pain medicines. °· You have a fever. °· You have unexplained weight loss. °· You have night sweats. °SEEK IMMEDIATE MEDICAL CARE IF: °· You cough up blood. °· You have difficulty breathing. °· Your heartbeat is very fast. °  °This information is not intended to replace advice given to you by your health care provider. Make sure you discuss any questions you have with your health care provider. °  °Document Released: 04/02/2011 Document Revised: 06/25/2015 Document Reviewed: 12/11/2014 °Elsevier Interactive Patient Education ©2016 Elsevier Inc. ° ° °Anemia, Nonspecific °Anemia is a condition in which the concentration of red blood cells or hemoglobin in the blood   is below normal. Hemoglobin is a substance in red blood cells that carries oxygen to the tissues of the body. Anemia results in not enough oxygen reaching these tissues.  °CAUSES  °Common causes of anemia include:  °· Excessive bleeding. Bleeding may be internal or external. This includes excessive bleeding from periods (in women) or from the intestine.   °· Poor nutrition.   °· Chronic kidney, thyroid, and liver  disease.  °· Bone marrow disorders that decrease red blood cell production. °· Cancer and treatments for cancer. °· HIV, AIDS, and their treatments. °· Spleen problems that increase red blood cell destruction. °· Blood disorders. °· Excess destruction of red blood cells due to infection, medicines, and autoimmune disorders. °SIGNS AND SYMPTOMS  °· Minor weakness.   °· Dizziness.   °· Headache. °· Palpitations.   °· Shortness of breath, especially with exercise.   °· Paleness. °· Cold sensitivity. °· Indigestion. °· Nausea. °· Difficulty sleeping. °· Difficulty concentrating. °Symptoms may occur suddenly or they may develop slowly.  °DIAGNOSIS  °Additional blood tests are often needed. These help your health care provider determine the best treatment. Your health care provider will check your stool for blood and look for other causes of blood loss.  °TREATMENT  °Treatment varies depending on the cause of the anemia. Treatment can include:  °· Supplements of iron, vitamin B12, or folic acid.   °· Hormone medicines.   °· A blood transfusion. This may be needed if blood loss is severe.   °· Hospitalization. This may be needed if there is significant continual blood loss.   °· Dietary changes. °· Spleen removal. °HOME CARE INSTRUCTIONS °Keep all follow-up appointments. It often takes many weeks to correct anemia, and having your health care provider check on your condition and your response to treatment is very important. °SEEK IMMEDIATE MEDICAL CARE IF:  °· You develop extreme weakness, shortness of breath, or chest pain.   °· You become dizzy or have trouble concentrating. °· You develop heavy vaginal bleeding.   °· You develop a rash.   °· You have bloody or black, tarry stools.   °· You faint.   °· You vomit up blood.   °· You vomit repeatedly.   °· You have abdominal pain. °· You have a fever or persistent symptoms for more than 2-3 days.   °· You have a fever and your symptoms suddenly get worse.   °· You are  dehydrated.   °MAKE SURE YOU: °· Understand these instructions. °· Will watch your condition. °· Will get help right away if you are not doing well or get worse. °  °This information is not intended to replace advice given to you by your health care provider. Make sure you discuss any questions you have with your health care provider. °  °Document Released: 11/11/2004 Document Revised: 06/06/2013 Document Reviewed: 03/30/2013 °Elsevier Interactive Patient Education ©2016 Elsevier Inc. ° ° °

## 2015-10-28 NOTE — ED Notes (Signed)
RT callled

## 2015-10-28 NOTE — ED Notes (Signed)
Per EMS-states nonproductive cough for 6-8 weeks-states she is here to be "checked out"

## 2015-10-28 NOTE — ED Notes (Signed)
Bed: [id number] Expected date:  Expected time:  Means of arrival:  Comments: Ems- cancer pt- cough

## 2015-10-28 NOTE — ED Notes (Signed)
Charge called pt and explained that folate level was the only blood test that lab was able to run and to let her pcp know when she has appt.

## 2015-10-28 NOTE — ED Provider Notes (Signed)
CSN: [id number]     Arrival date & time 10/28/15  1236 History   First MD Initiated Contact with Patient 10/28/15 1243     Chief Complaint  Patient presents with  . Cough     (Consider location/radiation/quality/duration/timing/severity/associated sxs/prior Treatment) Patient is a 80 y.o. female presenting with cough. The history is provided by the patient.  Cough Associated symptoms: no chest pain, no chills, no fever, no headaches, no rash, no shortness of breath and no sore throat   Patient notes hx asthma, and c/o increased coughing in the past 6-8 weeks, increased in past couple weeks.  Non productive cough. Episodic. No sore throat. Mild rhinorrhea. No fever or chills. No chest pain.  Is on home o2 continuously at home, 2 liters.  Denies any regular mdi or neb tx use.  No wt loss.  No swelling. No leg pain. No acute or abrupt change today.       Past Medical History  Diagnosis Date  . Anxiety   . Asthma   . Breast cancer ([place])     metastasized  . Depression   . Diabetes mellitus   . GERD (gastroesophageal reflux disease)   . Hyperlipidemia   . Internal hemorrhoids   . IBS (irritable bowel syndrome)   . Mitral valve prolapse   . Ischemic colitis ([place])   . Fibromyalgia   . Allergy   . Cataract   . S/P radiation therapy within four to twelve weeks 04/26/13-05/11/13    L-spine/sacrum 30Gy/58fx  . Colon polyp 2013    TUBULAR ADENOMA   Past Surgical History  Procedure Laterality Date  . Mastectomy Left     then treated with chemo and radiation  . Total abdominal hysterectomy    . Back surgery      synovial cyst removed from spine  . Carpal tunnel release Bilateral   . Dilation and curettage of uterus    . Appendectomy    . Cataract extraction Bilateral   . Colonoscopy w/ biopsies  2013  . Esophagogastroduodenoscopy  2008    normal   Family History  Problem Relation Age of Onset  . Prostate cancer Brother     x2  . Irritable bowel syndrome Mother   . Colon  cancer Neg Hx   . Stroke Father    Social History  Substance Use Topics  . Smoking status: Never Smoker   . Smokeless tobacco: Never Used  . Alcohol Use: No   OB History    No data available     Review of Systems  Constitutional: Negative for fever and chills.  HENT: Negative for sore throat.   Eyes: Negative for redness.  Respiratory: Positive for cough. Negative for shortness of breath.   Cardiovascular: Negative for chest pain and leg swelling.  Gastrointestinal: Negative for vomiting, abdominal pain and diarrhea.  Genitourinary: Negative for flank pain.  Musculoskeletal: Negative for back pain and neck pain.  Skin: Negative for rash.  Neurological: Negative for headaches.  Hematological: Does not bruise/bleed easily.  Psychiatric/Behavioral: Negative for confusion.      Allergies  Hydrocodone; Metformin and related; Morphine and related; Niacin; Other; and Promethazine hcl  Home Medications   Prior to Admission medications   Medication Sig Start Date End Date Taking? Authorizing Provider  acetaminophen (TYLENOL) 500 MG tablet Take 500 mg by mouth every 6 (six) hours as needed for mild pain or moderate pain (pain).     Historical Provider, MD  Alum & Mag Hydroxide-Simeth (MAGIC MOUTHWASH) SOLN  Take 5 mLs by mouth 3 (three) times daily as needed for mouth pain. Patient not taking: Reported on 06/18/2015 04/05/15   Leo Grosser, MD  Calcium Carbonate (CALCIUM 600 PO) Take 600 mg by mouth 3 (three) times a week.     Historical Provider, MD  Cholecalciferol (VITAMIN D) 1000 UNITS capsule Take 1,000 Units by mouth daily.     Historical Provider, MD  clonazePAM (KLONOPIN) 0.5 MG tablet Take 0.5 mg by mouth 2 (two) times daily.     Historical Provider, MD  esomeprazole (NEXIUM) 40 MG capsule Take 1 capsule (40 mg total) by mouth daily. Patient not taking: Reported on 06/23/2015 09/25/14   Laban Emperor Zehr, PA-C  everolimus (AFINITOR) 10 MG tablet Take 1 tablet (10 mg total) by mouth  daily. 03/14/15   Laurie Panda, NP  exemestane (AROMASIN) 25 MG tablet Take 1 tablet (25 mg total) by mouth daily after breakfast. 02/21/15   Chauncey Cruel, MD  glimepiride (AMARYL) 1 MG tablet Take 1 mg by mouth daily.     Historical Provider, MD  GLYCERIN ADULT 2 G suppository Place 1 suppository rectally daily as needed for mild constipation (constipation).  01/21/14   Historical Provider, MD  hydrochlorothiazide (HYDRODIURIL) 25 MG tablet Take 12.5 mg by mouth every Monday, Wednesday, and Friday.     Historical Provider, MD  hydrocortisone (ANUSOL-HC) 2.5 % rectal cream Place 1 application rectally daily as needed for hemorrhoids or itching.  05/18/13   Shon Baton, MD  lidocaine-hydrocortisone [facility]) 3-0.5 % CREA Place 1 Applicatorful rectally 2 (two) times daily as needed for hemorrhoids and bleeding. Patient not taking: Reported on 06/23/2015 05/17/13   Shon Baton, MD  ONGLYZA 5 MG TABS tablet Take 5 mg by mouth daily.  02/24/12   Historical Provider, MD  Polyethyl Glycol-Propyl Glycol (SYSTANE OP) Apply 1-2 drops to eye daily as needed (dry eyes).     Historical Provider, MD  PROAIR HFA 108 (90 BASE) MCG/ACT inhaler Inhale 1-2 puffs into the lungs every 6 (six) hours as needed for wheezing or shortness of breath (wheezing).  05/30/12   Historical Provider, MD  QVAR 80 MCG/ACT inhaler Inhale 2 puffs into the lungs at bedtime.  09/22/14   Historical Provider, MD  sucralfate (CARAFATE) 1 G tablet Take 1 g by mouth daily as needed ("when stomach is really bad").     Historical Provider, MD  traMADol (ULTRAM) 50 MG tablet TAKE 1 TABLET BY MOUTH EVERY 6 HOURS AS NEEDED FOR PAIN Patient taking differently: TAKE 50 MG BY MOUTH EVERY 6 HOURS AS NEEDED FOR PAIN 03/18/15   Chauncey Cruel, MD   There were no vitals taken for this visit. Physical Exam  Constitutional: She is oriented to person, place, and time. She appears well-developed and well-nourished. No distress.  HENT:  Mouth/Throat:  Oropharynx is clear and moist.  Eyes: Conjunctivae are normal. No scleral icterus.  Neck: Neck supple. No tracheal deviation present.  Cardiovascular: Normal rate, regular rhythm, normal heart sounds and intact distal pulses.  Exam reveals no gallop and no friction rub.   No murmur heard. Pulmonary/Chest: Effort normal and breath sounds normal. No respiratory distress.  Abdominal: Soft. Normal appearance and bowel sounds are normal. She exhibits no distension.  Musculoskeletal: She exhibits no edema or tenderness.  Neurological: She is alert and oriented to person, place, and time.  Skin: Skin is warm and dry. No rash noted.  Psychiatric: She has a normal mood and affect.  Nursing note  and vitals reviewed.   ED Course  Procedures (including critical care time) Labs Review   Results for orders placed or performed during the hospital encounter of 10/28/15  CBC  Result Value Ref Range   WBC 5.7 4.0 - 10.5 K/uL   RBC 2.36 (L) 3.87 - 5.11 MIL/uL   Hemoglobin 8.6 (L) 12.0 - 15.0 g/dL   HCT 26.2 (L) 36.0 - 46.0 %   MCV 111.0 (H) 78.0 - 100.0 fL   MCH 36.4 (H) 26.0 - 34.0 pg   MCHC 32.8 30.0 - 36.0 g/dL   RDW 19.5 (H) 11.5 - 15.5 %   Platelets 163 150 - 400 K/uL  Comprehensive metabolic panel  Result Value Ref Range   Sodium 142 135 - 145 mmol/L   Potassium 3.6 3.5 - 5.1 mmol/L   Chloride 108 101 - 111 mmol/L   CO2 24 22 - 32 mmol/L   Glucose, Bld 153 (H) 65 - 99 mg/dL   BUN 8 6 - 20 mg/dL   Creatinine, Ser 0.62 0.44 - 1.00 mg/dL   Calcium 9.3 8.9 - 10.3 mg/dL   Total Protein 6.0 (L) 6.5 - 8.1 g/dL   Albumin 3.3 (L) 3.5 - 5.0 g/dL   AST 72 (H) 15 - 41 U/L   ALT 22 14 - 54 U/L   Alkaline Phosphatase 360 (H) 38 - 126 U/L   Total Bilirubin 2.2 (H) 0.3 - 1.2 mg/dL   GFR calc non Af Amer >60 >60 mL/min   GFR calc Af Amer >60 >60 mL/min   Anion gap 10 5 - 15  Troponin I  Result Value Ref Range   Troponin I 0.03 <0.031 ng/mL   Dg Chest 2 View  10/28/2015  CLINICAL DATA:   Nonproductive cough for 6-8 weeks EXAM: CHEST  2 VIEW COMPARISON:  CT chest 06/23/2015, chest x-ray 06/23/2015 FINDINGS: There is mild bilateral chronic interstitial thickening. There is no focal parenchymal opacity. There is no pleural effusion or pneumothorax. The heart and mediastinal contours are unremarkable. There is right rotator cuff calcific tendinosis. IMPRESSION: No active cardiopulmonary disease. Electronically Signed   By: Kathreen Devoid   On: 10/28/2015 13:28      I have personally reviewed and evaluated these images and lab results as part of my medical decision-making.   EKG Interpretation   Date/Time:  Tuesday October 28 2015 13:35:49 EST Ventricular Rate:  97 PR Interval:  195 QRS Duration: 80 QT Interval:  372 QTC Calculation: 472 R Axis:   53 Text Interpretation:  Sinus rhythm No significant change since last  tracing Confirmed by Ashok Cordia  MD, Lennette Bihari ([id number]) on 10/28/2015 1:38:31 PM      MDM   Cxr.  Reviewed nursing notes and prior charts for additional history.   Alb and atrovent neb. Robitussin po.  Recheck no wheezing or increased wob.    Pt w anemia, hct similar to prior.  Anemia panel added to labs.  Pt denies any pain, no chest pain. Chest cta. abd soft nt.  No new c/o.  Pt indicates feels improved and ready for d/c.   rec close pcp f/u.   Return precautions provided.       Lajean Saver, MD 10/28/15 [phone]

## 2015-10-28 NOTE — ED Notes (Signed)
Lab only able to run folate level. None of the other anemia panel labs.

## 2015-10-28 NOTE — ED Notes (Signed)
Called patient's husband to inform him of her discharge-states he will pick her up with O2

## 2015-11-07 ENCOUNTER — Ambulatory Visit: Payer: Medicare HMO | Admitting: Oncology

## 2015-11-11 ENCOUNTER — Encounter: Payer: Self-pay | Admitting: Oncology

## 2015-11-11 NOTE — Progress Notes (Signed)
No show

## 2015-11-19 ENCOUNTER — Emergency Department (HOSPITAL_COMMUNITY): Payer: Medicare Other

## 2015-11-19 ENCOUNTER — Encounter (HOSPITAL_COMMUNITY): Payer: Self-pay | Admitting: Vascular Surgery

## 2015-11-19 ENCOUNTER — Inpatient Hospital Stay (HOSPITAL_COMMUNITY)
Admission: EM | Admit: 2015-11-19 | Discharge: 2015-11-25 | DRG: 436 | Disposition: A | Discharge status: Skilled Nursing Facility | Payer: Medicare Other | Attending: Internal Medicine | Admitting: Internal Medicine

## 2015-11-19 DIAGNOSIS — Z7189 Other specified counseling: Secondary | ICD-10-CM

## 2015-11-19 DIAGNOSIS — Z66 Do not resuscitate: Secondary | ICD-10-CM | POA: Diagnosis present

## 2015-11-19 DIAGNOSIS — I10 Essential (primary) hypertension: Secondary | ICD-10-CM | POA: Diagnosis present

## 2015-11-19 DIAGNOSIS — R160 Hepatomegaly, not elsewhere classified: Secondary | ICD-10-CM

## 2015-11-19 DIAGNOSIS — C50919 Malignant neoplasm of unspecified site of unspecified female breast: Secondary | ICD-10-CM | POA: Diagnosis not present

## 2015-11-19 DIAGNOSIS — Z853 Personal history of malignant neoplasm of breast: Secondary | ICD-10-CM | POA: Diagnosis not present

## 2015-11-19 DIAGNOSIS — Z90710 Acquired absence of both cervix and uterus: Secondary | ICD-10-CM | POA: Diagnosis not present

## 2015-11-19 DIAGNOSIS — E119 Type 2 diabetes mellitus without complications: Secondary | ICD-10-CM

## 2015-11-19 DIAGNOSIS — R140 Abdominal distension (gaseous): Secondary | ICD-10-CM | POA: Diagnosis present

## 2015-11-19 DIAGNOSIS — K219 Gastro-esophageal reflux disease without esophagitis: Secondary | ICD-10-CM | POA: Diagnosis present

## 2015-11-19 DIAGNOSIS — Z809 Family history of malignant neoplasm, unspecified: Secondary | ICD-10-CM | POA: Diagnosis not present

## 2015-11-19 DIAGNOSIS — Z79899 Other long term (current) drug therapy: Secondary | ICD-10-CM | POA: Diagnosis not present

## 2015-11-19 DIAGNOSIS — R188 Other ascites: Secondary | ICD-10-CM | POA: Diagnosis not present

## 2015-11-19 DIAGNOSIS — Z515 Encounter for palliative care: Secondary | ICD-10-CM | POA: Insufficient documentation

## 2015-11-19 DIAGNOSIS — C7951 Secondary malignant neoplasm of bone: Secondary | ICD-10-CM | POA: Diagnosis present

## 2015-11-19 DIAGNOSIS — Z794 Long term (current) use of insulin: Secondary | ICD-10-CM

## 2015-11-19 DIAGNOSIS — K7200 Acute and subacute hepatic failure without coma: Secondary | ICD-10-CM

## 2015-11-19 DIAGNOSIS — R180 Malignant ascites: Secondary | ICD-10-CM | POA: Diagnosis present

## 2015-11-19 DIAGNOSIS — Z6825 Body mass index (BMI) 25.0-25.9, adult: Secondary | ICD-10-CM

## 2015-11-19 DIAGNOSIS — Z923 Personal history of irradiation: Secondary | ICD-10-CM

## 2015-11-19 DIAGNOSIS — Z9012 Acquired absence of left breast and nipple: Secondary | ICD-10-CM | POA: Diagnosis not present

## 2015-11-19 DIAGNOSIS — D630 Anemia in neoplastic disease: Secondary | ICD-10-CM | POA: Diagnosis present

## 2015-11-19 DIAGNOSIS — Z789 Other specified health status: Secondary | ICD-10-CM | POA: Insufficient documentation

## 2015-11-19 DIAGNOSIS — C787 Secondary malignant neoplasm of liver and intrahepatic bile duct: Principal | ICD-10-CM | POA: Diagnosis present

## 2015-11-19 DIAGNOSIS — K7290 Hepatic failure, unspecified without coma: Secondary | ICD-10-CM | POA: Diagnosis present

## 2015-11-19 DIAGNOSIS — Z9049 Acquired absence of other specified parts of digestive tract: Secondary | ICD-10-CM

## 2015-11-19 DIAGNOSIS — R1084 Generalized abdominal pain: Secondary | ICD-10-CM

## 2015-11-19 DIAGNOSIS — E46 Unspecified protein-calorie malnutrition: Secondary | ICD-10-CM | POA: Diagnosis present

## 2015-11-19 DIAGNOSIS — R627 Adult failure to thrive: Secondary | ICD-10-CM | POA: Diagnosis present

## 2015-11-19 HISTORY — DX: Essential (primary) hypertension: I10

## 2015-11-19 LAB — COMPREHENSIVE METABOLIC PANEL
ALT: 20 U/L (ref 14–54)
ANION GAP: 10 (ref 5–15)
AST: 71 U/L — AB (ref 15–41)
Albumin: 2.7 g/dL — ABNORMAL LOW (ref 3.5–5.0)
Alkaline Phosphatase: 365 U/L — ABNORMAL HIGH (ref 38–126)
BUN: 9 mg/dL (ref 6–20)
CHLORIDE: 104 mmol/L (ref 101–111)
CO2: 24 mmol/L (ref 22–32)
Calcium: 9.1 mg/dL (ref 8.9–10.3)
Creatinine, Ser: 0.88 mg/dL (ref 0.44–1.00)
GFR calc non Af Amer: >60 mL/min (ref >60)
GLUCOSE: 161 mg/dL — AB (ref 65–99)
POTASSIUM: 3.6 mmol/L (ref 3.5–5.1)
Sodium: 138 mmol/L (ref 135–145)
Total Bilirubin: 2.6 mg/dL — ABNORMAL HIGH (ref 0.3–1.2)
Total Protein: 5.4 g/dL — ABNORMAL LOW (ref 6.5–8.1)

## 2015-11-19 LAB — BRAIN NATRIURETIC PEPTIDE: B NATRIURETIC PEPTIDE 5: 186.2 pg/mL — AB (ref 0.0–100.0)

## 2015-11-19 LAB — URINALYSIS, ROUTINE W REFLEX MICROSCOPIC
GLUCOSE, UA: NEGATIVE mg/dL
Hgb urine dipstick: NEGATIVE
Ketones, ur: 15 mg/dL — AB
Nitrite: NEGATIVE
PROTEIN: 30 mg/dL — AB
Specific Gravity, Urine: 1.023 (ref 1.005–1.030)
pH: 5.5 (ref 5.0–8.0)

## 2015-11-19 LAB — CBC WITH DIFFERENTIAL/PLATELET
BASOS PCT: 0 %
Basophils Absolute: 0 10*3/uL (ref 0.0–0.1)
EOS ABS: 0.1 10*3/uL (ref 0.0–0.7)
Eosinophils Relative: 1 %
HEMATOCRIT: 25.2 % — AB (ref 36.0–46.0)
HEMOGLOBIN: 8.5 g/dL — AB (ref 12.0–15.0)
Lymphocytes Relative: 29 %
Lymphs Abs: 1.9 10*3/uL (ref 0.7–4.0)
MCH: 37.3 pg — ABNORMAL HIGH (ref 26.0–34.0)
MCHC: 33.7 g/dL (ref 30.0–36.0)
MCV: 110.5 fL — ABNORMAL HIGH (ref 78.0–100.0)
MONO ABS: 1.1 10*3/uL — AB (ref 0.1–1.0)
Monocytes Relative: 17 %
NEUTROS ABS: 3.5 10*3/uL (ref 1.7–7.7)
Neutrophils Relative %: 53 %
Platelets: 174 10*3/uL (ref 150–400)
RBC: 2.28 MIL/uL — AB (ref 3.87–5.11)
RDW: 20.4 % — AB (ref 11.5–15.5)
WBC: 6.6 10*3/uL (ref 4.0–10.5)

## 2015-11-19 LAB — URINE MICROSCOPIC-ADD ON: RBC / HPF: NONE SEEN RBC/hpf (ref 0–5)

## 2015-11-19 LAB — CBC
HEMATOCRIT: 34.9 % — AB (ref 36.0–46.0)
Hemoglobin: 11.2 g/dL — ABNORMAL LOW (ref 12.0–15.0)
MCH: 28.1 pg (ref 26.0–34.0)
MCHC: 32.1 g/dL (ref 30.0–36.0)
MCV: 87.7 fL (ref 78.0–100.0)
PLATELETS: 139 10*3/uL — AB (ref 150–400)
RBC: 3.98 MIL/uL (ref 3.87–5.11)
RDW: 14.9 % (ref 11.5–15.5)
WBC: 3.4 10*3/uL — AB (ref 4.0–10.5)

## 2015-11-19 LAB — LIPASE, BLOOD: Lipase: 38 U/L (ref 11–51)

## 2015-11-19 LAB — GLUCOSE, CAPILLARY: Glucose-Capillary: 216 mg/dL — ABNORMAL HIGH (ref 65–99)

## 2015-11-19 MED ORDER — TRAMADOL HCL 50 MG PO TABS
50.0000 mg | ORAL_TABLET | Freq: Four times a day (QID) | ORAL | Status: DC | PRN
Start: 2015-11-19 — End: 2015-11-25
  Administered 2015-11-24: 50 mg via ORAL
  Filled 2015-11-19: qty 1

## 2015-11-19 MED ORDER — SODIUM CHLORIDE 0.9 % IV SOLN
Freq: Once | INTRAVENOUS | Status: AC
  Administered 2015-11-19: 12:00:00 via INTRAVENOUS

## 2015-11-19 MED ORDER — INSULIN ASPART 100 UNIT/ML ~~LOC~~ SOLN
0.0000 [IU] | Freq: Three times a day (TID) | SUBCUTANEOUS | Status: DC
  Administered 2015-11-20: 3 [IU] via SUBCUTANEOUS
  Administered 2015-11-20 (×2): 2 [IU] via SUBCUTANEOUS
  Administered 2015-11-21: 3 [IU] via SUBCUTANEOUS
  Administered 2015-11-21: 5 [IU] via SUBCUTANEOUS
  Administered 2015-11-22: 3 [IU] via SUBCUTANEOUS
  Administered 2015-11-22: 2 [IU] via SUBCUTANEOUS
  Administered 2015-11-22: 5 [IU] via SUBCUTANEOUS
  Administered 2015-11-23 (×2): 3 [IU] via SUBCUTANEOUS
  Administered 2015-11-24: 2 [IU] via SUBCUTANEOUS
  Administered 2015-11-24 – 2015-11-25 (×3): 3 [IU] via SUBCUTANEOUS

## 2015-11-19 MED ORDER — POLYETHYLENE GLYCOL 3350 17 G PO PACK
17.0000 g | PACK | Freq: Every day | ORAL | Status: DC | PRN
  Administered 2015-11-24: 17 g via ORAL
  Filled 2015-11-19: qty 1

## 2015-11-19 MED ORDER — SODIUM CHLORIDE 0.9 % IV SOLN: 250.0000 mL | INTRAVENOUS | Status: DC | PRN

## 2015-11-19 MED ORDER — ONDANSETRON HCL 4 MG/2ML IJ SOLN: 4.0000 mg | Freq: Three times a day (TID) | INTRAMUSCULAR | Status: DC | PRN

## 2015-11-19 MED ORDER — INSULIN ASPART 100 UNIT/ML ~~LOC~~ SOLN: 0.0000 [IU] | Freq: Every day | SUBCUTANEOUS | Status: DC

## 2015-11-19 MED ORDER — CLONAZEPAM 0.5 MG PO TABS
0.5000 mg | ORAL_TABLET | Freq: Two times a day (BID) | ORAL | Status: DC | PRN
  Administered 2015-11-20 – 2015-11-24 (×5): 0.5 mg via ORAL
  Filled 2015-11-19 (×5): qty 1

## 2015-11-19 MED ORDER — METOCLOPRAMIDE HCL 5 MG PO TABS: 5.0000 mg | ORAL_TABLET | Freq: Three times a day (TID) | ORAL | Status: DC | PRN

## 2015-11-19 MED ORDER — POLYETHYL GLYCOL-PROPYL GLYCOL 0.4-0.3 % OP GEL: Freq: Every day | OPHTHALMIC | Status: DC | PRN

## 2015-11-19 MED ORDER — SODIUM CHLORIDE 0.9% FLUSH
3.0000 mL | Freq: Two times a day (BID) | INTRAVENOUS | Status: DC
  Administered 2015-11-20 – 2015-11-25 (×10): 3 mL via INTRAVENOUS

## 2015-11-19 MED ORDER — SODIUM CHLORIDE 0.9% FLUSH: 3.0000 mL | INTRAVENOUS | Status: DC | PRN

## 2015-11-19 MED ORDER — ACETAMINOPHEN 325 MG PO TABS
650.0000 mg | ORAL_TABLET | Freq: Four times a day (QID) | ORAL | Status: DC | PRN
  Administered 2015-11-20 – 2015-11-21 (×3): 650 mg via ORAL
  Filled 2015-11-19 (×3): qty 2

## 2015-11-19 MED ORDER — IOHEXOL 300 MG/ML  SOLN
80.0000 mL | Freq: Once | INTRAMUSCULAR | Status: AC | PRN
  Administered 2015-11-19: 80 mL via INTRAVENOUS

## 2015-11-19 MED ORDER — POLYVINYL ALCOHOL 1.4 % OP SOLN
1.0000 [drp] | OPHTHALMIC | Status: DC | PRN
  Filled 2015-11-19: qty 15

## 2015-11-19 MED ORDER — BISACODYL 5 MG PO TBEC
5.0000 mg | DELAYED_RELEASE_TABLET | Freq: Every day | ORAL | Status: DC | PRN
Start: 2015-11-19 — End: 2015-11-25
  Administered 2015-11-20 – 2015-11-24 (×2): 5 mg via ORAL
  Filled 2015-11-19 (×2): qty 1

## 2015-11-19 MED ORDER — ONDANSETRON HCL 4 MG/2ML IJ SOLN: 4.0000 mg | Freq: Four times a day (QID) | INTRAMUSCULAR | Status: DC | PRN

## 2015-11-19 MED ORDER — ONDANSETRON HCL 4 MG PO TABS: 4.0000 mg | ORAL_TABLET | Freq: Four times a day (QID) | ORAL | Status: DC | PRN

## 2015-11-19 MED ORDER — ENOXAPARIN SODIUM 40 MG/0.4ML ~~LOC~~ SOLN: 40.0000 mg | SUBCUTANEOUS | Status: DC

## 2015-11-19 MED ORDER — ALUM & MAG HYDROXIDE-SIMETH 200-200-20 MG/5ML PO SUSP: 30.0000 mL | Freq: Four times a day (QID) | ORAL | Status: DC | PRN

## 2015-11-19 NOTE — Care Management Note (Signed)
Case Management Note  Patient Details  Name: RESSA TAPANI MRN: [id number] Date of Birth: June 23, 1936  Subjective/Objective:                  80 y.o. female, with a history of fibromyalgia, IBS, ischemic colitis, DM, and metastatic breast cancer, presenting to the ED with generalized abdominal pain and distension for "many months." //Home with husband and [place].  Action/Plan: Follow for disposition needs.   Expected Discharge Date:     11/19/15             Expected Discharge Plan:  Home w Hospice Care  In-House Referral:  NA  Discharge planning Services  CM Consult  Post Acute Care Choice:    Choice offered to:  NA  DME Arranged:  N/A DME Agency:  NA  HH Arranged:  NA HH Agency:  [facility]  Status of Service:     Medicare Important Message Given:    Date Medicare IM Given:    Medicare IM give by:    Date Additional Medicare IM Given:    Additional Medicare Important Message give by:     If discussed at [place] of Stay Meetings, dates discussed:    Additional Comments:  Fuller Mandril, RN 11/19/2015, 2:29 PM

## 2015-11-19 NOTE — ED Provider Notes (Signed)
CSN: [id number]     Arrival date & time 11/19/15  1022 History   First MD Initiated Contact with Patient 11/19/15 1119     Chief Complaint  Patient presents with  . Abdominal Pain     (Consider location/radiation/quality/duration/timing/severity/associated sxs/prior Treatment) HPI   Jamie [name] is a 80 y.o. female, with a history of fibromyalgia, IBS, ischemic colitis, DM, and metastatic breast cancer, presenting to the ED with generalized abdominal pain and distension for "many months." Pt states the pain worsens with eating.  Pt also complains of intermittent diarrhea for the past week as well as fatigue for "many months." Pt states she has had decreased appetite and weight loss of 25 lbs over the past 6 weeks. Pt rates her pain at 4/10, "feels like bloating," nonradiating. Pt lives at home with her husband. Pt also complains of a dry cough for the last year. Pt states she wears 2 LPM O2 at home, but does not know what it's for. Pt denies chest pain, shortness of breath, N/V/C, fever/chills, or any other complaints. Pt states that she no longer receives treatment for her breast cancer. Patient's oncologist is Dr. Jana Hakim.   Past Medical History  Diagnosis Date  . Anxiety   . Asthma   . Breast cancer ([place])     metastasized  . Depression   . Diabetes mellitus   . GERD (gastroesophageal reflux disease)   . Hyperlipidemia   . Internal hemorrhoids   . IBS (irritable bowel syndrome)   . Mitral valve prolapse   . Ischemic colitis ([place])   . Fibromyalgia   . Allergy   . Cataract   . S/P radiation therapy within four to twelve weeks 04/26/13-05/11/13    L-spine/sacrum 30Gy/54fx  . Colon polyp 2013    TUBULAR ADENOMA  . Hypertension    Past Surgical History  Procedure Laterality Date  . Mastectomy Left     then treated with chemo and radiation  . Total abdominal hysterectomy    . Back surgery      synovial cyst removed from spine  . Carpal tunnel release Bilateral   . Dilation  and curettage of uterus    . Appendectomy    . Cataract extraction Bilateral   . Colonoscopy w/ biopsies  2013  . Esophagogastroduodenoscopy  2008    normal   Family History  Problem Relation Age of Onset  . Prostate cancer Brother     x2  . Irritable bowel syndrome Mother   . Colon cancer Neg Hx   . Stroke Father    Social History  Substance Use Topics  . Smoking status: Never Smoker   . Smokeless tobacco: Never Used  . Alcohol Use: No   OB History    No data available     Review of Systems  Constitutional: Positive for appetite change, fatigue and unexpected weight change. Negative for fever, chills and diaphoresis.  Respiratory: Positive for cough. Negative for shortness of breath.   Cardiovascular: Negative for chest pain.  Gastrointestinal: Positive for abdominal pain, diarrhea and abdominal distention. Negative for nausea, vomiting, constipation and blood in stool.  Genitourinary: Negative for dysuria.  All other systems reviewed and are negative.     Allergies  Hydrocodone; Metformin and related; Morphine and related; Niacin; Other; and Promethazine hcl  Home Medications   Prior to Admission medications   Medication Sig Start Date End Date Taking? Authorizing Provider  acetaminophen (TYLENOL) 500 MG tablet Take 500 mg by mouth every 6 (six)  hours as needed for mild pain or moderate pain (pain).    Yes Historical Provider, MD  benzonatate (TESSALON) 200 MG capsule Take 200 mg by mouth 3 (three) times daily as needed for cough.  10/23/15  Yes Historical Provider, MD  Cholecalciferol (VITAMIN D) 1000 UNITS capsule Take 1,000 Units by mouth daily.    Yes Historical Provider, MD  clonazePAM (KLONOPIN) 0.5 MG tablet Take 0.5 mg by mouth 2 (two) times daily.    Yes Historical Provider, MD  esomeprazole (NEXIUM) 40 MG capsule Take 1 capsule (40 mg total) by mouth daily. 09/25/14  Yes Jessica D Zehr, PA-C  everolimus (AFINITOR) 10 MG tablet Take 1 tablet (10 mg total) by  mouth daily. 03/14/15  Yes Laurie Panda, NP  exemestane (AROMASIN) 25 MG tablet Take 1 tablet (25 mg total) by mouth daily after breakfast. 02/21/15  Yes Chauncey Cruel, MD  GLYCERIN ADULT 2 G suppository Place 1 suppository rectally daily as needed for mild constipation (constipation).  01/21/14  Yes Historical Provider, MD  hydrocortisone (ANUSOL-HC) 2.5 % rectal cream Place 1 application rectally daily as needed for hemorrhoids or itching.  05/18/13  Yes Shon Baton, MD  metoCLOPramide (REGLAN) 5 MG tablet Take 5 mg by mouth 3 (three) times daily as needed for nausea.  10/16/15  Yes Historical Provider, MD  Polyethyl Glycol-Propyl Glycol (SYSTANE OP) Apply 1-2 drops to eye daily as needed (dry eyes).    Yes Historical Provider, MD  polyethylene glycol (MIRALAX / GLYCOLAX) packet Take 17 g by mouth daily as needed for mild constipation.   Yes Historical Provider, MD  PROAIR HFA 108 (90 BASE) MCG/ACT inhaler Inhale 1-2 puffs into the lungs every 6 (six) hours as needed for wheezing or shortness of breath (wheezing).  05/30/12  Yes Historical Provider, MD  QVAR 80 MCG/ACT inhaler Inhale 2 puffs into the lungs at bedtime.  09/22/14  Yes Historical Provider, MD  traMADol (ULTRAM) 50 MG tablet TAKE 1 TABLET BY MOUTH EVERY 6 HOURS AS NEEDED FOR PAIN Patient taking differently: TAKE 50 MG BY MOUTH EVERY 6 HOURS AS NEEDED FOR PAIN 03/18/15  Yes Virgie Dad Magrinat, MD   BP 128/62 mmHg  Pulse 92  Temp(Src) 98.1 F (36.7 C) (Oral)  Resp 22  SpO2 100% Physical Exam  Constitutional: She is oriented to person, place, and time. She appears well-developed and well-nourished. No distress.  HENT:  Head: Normocephalic and atraumatic.  Mouth/Throat: Oropharynx is clear and moist.  Eyes: Conjunctivae and EOM are normal. Pupils are equal, round, and reactive to light.  Neck: Normal range of motion. Neck supple.  Cardiovascular: Regular rhythm, normal heart sounds and intact distal pulses.  Tachycardia present.    Pulmonary/Chest: Effort normal and breath sounds normal. No respiratory distress.  Abdominal: Soft. She exhibits distension (Large amount of generalized distention, causing abdominal skin to be pulled tight ). Bowel sounds are increased. There is generalized tenderness.  Musculoskeletal: She exhibits no edema or tenderness.  Lymphadenopathy:    She has no cervical adenopathy.  Neurological: She is alert and oriented to person, place, and time. She has normal reflexes.  No sensory deficits. Strength 5/5 in all extremities. No gait disturbance. Coordination intact. Cranial nerves III-XII grossly intact. No facial droop.   Skin: Skin is warm and dry. She is not diaphoretic.  Nursing note and vitals reviewed.   ED Course  Procedures (including critical care time) Labs Review Labs Reviewed  CBC - Abnormal; Notable for the following:  WBC 3.4 (*)    Hemoglobin 11.2 (*)    HCT 34.9 (*)    Platelets 139 (*)    All other components within normal limits  URINALYSIS, ROUTINE W REFLEX MICROSCOPIC (NOT AT [facility]) - Abnormal; Notable for the following:    Color, Urine AMBER (*)    APPearance CLOUDY (*)    Bilirubin Urine SMALL (*)    Ketones, ur 15 (*)    Protein, ur 30 (*)    Leukocytes, UA SMALL (*)    All other components within normal limits  COMPREHENSIVE METABOLIC PANEL - Abnormal; Notable for the following:    Glucose, Bld 161 (*)    Total Protein 5.4 (*)    Albumin 2.7 (*)    AST 71 (*)    Alkaline Phosphatase 365 (*)    Total Bilirubin 2.6 (*)    All other components within normal limits  BRAIN NATRIURETIC PEPTIDE - Abnormal; Notable for the following:    B Natriuretic Peptide 186.2 (*)    All other components within normal limits  CBC WITH DIFFERENTIAL/PLATELET - Abnormal; Notable for the following:    RBC 2.28 (*)    Hemoglobin 8.5 (*)    HCT 25.2 (*)    MCV 110.5 (*)    MCH 37.3 (*)    RDW 20.4 (*)    Monocytes Absolute 1.1 (*)    All other components within normal  limits  URINE MICROSCOPIC-ADD ON - Abnormal; Notable for the following:    Squamous Epithelial / LPF 0-5 (*)    Bacteria, UA RARE (*)    Casts HYALINE CASTS (*)    All other components within normal limits  URINE CULTURE  LIPASE, BLOOD    Imaging Review No results found. I have personally reviewed and evaluated these images and lab results as part of my medical decision-making.   EKG Interpretation None      MDM   Final diagnoses:  Generalized abdominal pain  Abdominal distention    Murray Hodgkins Stevick presents with abdominal discomfort and distention, decreased appetite, and weight loss over the past few months.   Findings and plan of care discussed with Dorie Rank, MD.  I suspect that this patient's current abdominal pain may be a resurgence or a spread of her metastatic breast cancer. A chart review reveals that the patient has been to the ED before for either her cough or her abdominal pain. Patient's latest imaging included a clear chest xray on Oct 28, 2015 and a negative CTA Chest in Sept 2016. Patient's latest bone scan was in August 2016 and revealed stable metastatic disease. Combing through the previous specialist notes has proven difficult to establish a timeline. It seems as though the patient has had metastatic breast cancer since 2006 and the patient has shown up to her oncologist appointments only intermittently. Patient seems to have issues going on at home with her husband. There is some question of occasional confusion due to brain metastases. Case management consult due to patient not being able to care for herself and difficulty getting to appointments.  12:07 PM spoke with Rosendo Gros from case management. Advised that she would get in touch with social work and have them consult on this as well.  1:31 PM it was noted that this patient had labs that were pending for quite a while. When this was investigated it was found that the CMP, BNP, and lipase were canceled by the  hematology lab, with the reason given as "mislabeled." [place], Therapist, sports  states that she did not mislabel them and checked multiple times. This issue was brought to the ED nursing administrators. Patient was reevaluated multiple times while here in the ED with no changes noted. 3:16 PM End of shift patient care handoff report given to [facility], PA-C.  Plan: Await CT results of the head and abdomen. Give appropriate treatment and referrals. Contact PCPs office to notify them of the patient's visit to the ED.  Filed Vitals:   11/19/15 1330 11/19/15 1345 11/19/15 1400 11/19/15 1445  BP: 141/64 132/53 155/69 128/62  Pulse: 99 102 99 92  Temp:      TempSrc:      Resp: 20 22 20 22   SpO2: 100% 100% 100% 100%     Lorayne Bender, PA-C 11/19/15 1527  Dorie Rank, MD 11/19/15 1652

## 2015-11-19 NOTE — ED Provider Notes (Signed)
Patient care assumed from [place], [place] at shift change pending abdominal and head CT results.   In short, 80 year old female with hx of metastatic breast cancer presented to ED with generalized abdominal pain and worsening distension x multiple months. Associated symptoms include fatigue, weight loss, anorexia and diarrhea. Pt has not received radiation or chemotherapy since 2016. She is still receiving palliative medications. Pt is extremely poor historian and it is unsure if she is working with hospice care or desires further treatment. She has had multiple no show appointments with her oncologists (Dr. Jana Hakim) in the past 3-4 months. CT abdomen-pelvic shows new ascites which requires diagnostic paracentesis. Also demonstrate new liver mass that will require MRI after paracentesis. Will consult hospitalist for admission for diagnostic paracentesis. Will need to bring oncology on board to determine goals of care because pt herself seems uncertain. Discussed pt with Dr. Coralyn Pear who will admit pt for further care.  Filed Vitals:   11/19/15 1600 11/19/15 1615 11/19/15 1630 11/19/15 1645  BP: 141/62 154/63 150/64 154/78  Pulse: 106 106 104 115  Temp:      TempSrc:      Resp: 15 24 19 24   SpO2: 100% 100% 100% 98%   Results for orders placed or performed during the hospital encounter of 11/19/15 (from the past 24 hour(s))  Comprehensive metabolic panel     Status: Abnormal   Collection Time: 11/19/15 10:54 AM  Result Value Ref Range   Sodium 138 135 - 145 mmol/L   Potassium 3.6 3.5 - 5.1 mmol/L   Chloride 104 101 - 111 mmol/L   CO2 24 22 - 32 mmol/L   Glucose, Bld 161 (H) 65 - 99 mg/dL   BUN 9 6 - 20 mg/dL   Creatinine, Ser 0.88 0.44 - 1.00 mg/dL   Calcium 9.1 8.9 - 10.3 mg/dL   Total Protein 5.4 (L) 6.5 - 8.1 g/dL   Albumin 2.7 (L) 3.5 - 5.0 g/dL   AST 71 (H) 15 - 41 U/L   ALT 20 14 - 54 U/L   Alkaline Phosphatase 365 (H) 38 - 126 U/L   Total Bilirubin 2.6 (H) 0.3 - 1.2 mg/dL   GFR  calc non Af Amer >60 >60 mL/min   GFR calc Af Amer >60 >60 mL/min   Anion gap 10 5 - 15  Lipase, blood     Status: None   Collection Time: 11/19/15 10:54 AM  Result Value Ref Range   Lipase 38 11 - 51 U/L  Brain natriuretic peptide     Status: Abnormal   Collection Time: 11/19/15 10:54 AM  Result Value Ref Range   B Natriuretic Peptide 186.2 (H) 0.0 - 100.0 pg/mL  CBC with Differential     Status: Abnormal   Collection Time: 11/19/15 10:54 AM  Result Value Ref Range   WBC 6.6 4.0 - 10.5 K/uL   RBC 2.28 (L) 3.87 - 5.11 MIL/uL   Hemoglobin 8.5 (L) 12.0 - 15.0 g/dL   HCT 25.2 (L) 36.0 - 46.0 %   MCV 110.5 (H) 78.0 - 100.0 fL   MCH 37.3 (H) 26.0 - 34.0 pg   MCHC 33.7 30.0 - 36.0 g/dL   RDW 20.4 (H) 11.5 - 15.5 %   Platelets 174 150 - 400 K/uL   Neutro Abs 3.5 1.7 - 7.7 K/uL   Lymphs Abs 1.9 0.7 - 4.0 K/uL   Monocytes Absolute 1.1 (H) 0.1 - 1.0 K/uL   Eosinophils Absolute 0.1 0.0 - 0.7  K/uL   Basophils Absolute 0 0.0 - 0.1 K/uL   Neutrophils Relative % 53 %   Lymphocytes Relative 29 %   Monocytes Relative 17 %   Eosinophils Relative 1 %   Basophils Relative 0 %   RBC Morphology SCHISTOCYTES PRESENT (2-5/hpf)    WBC Morphology ATYPICAL MONONUCLEAR CELLS   CBC     Status: Abnormal   Collection Time: 11/19/15 12:04 PM  Result Value Ref Range   WBC 3.4 (L) 4.0 - 10.5 K/uL   RBC 3.98 3.87 - 5.11 MIL/uL   Hemoglobin 11.2 (L) 12.0 - 15.0 g/dL   HCT 34.9 (L) 36.0 - 46.0 %   MCV 87.7 78.0 - 100.0 fL   MCH 28.1 26.0 - 34.0 pg   MCHC 32.1 30.0 - 36.0 g/dL   RDW 14.9 11.5 - 15.5 %   Platelets 139 (L) 150 - 400 K/uL  Urinalysis, Routine w reflex microscopic (not at [facility])     Status: Abnormal   Collection Time: 11/19/15  2:20 PM  Result Value Ref Range   Color, Urine AMBER (A) YELLOW   APPearance CLOUDY (A) CLEAR   Specific Gravity, Urine 1.023 1.005 - 1.030   pH 5.5 5.0 - 8.0   Glucose, UA NEGATIVE NEGATIVE mg/dL   Hgb urine dipstick NEGATIVE NEGATIVE   Bilirubin Urine SMALL (A)  NEGATIVE   Ketones, ur 15 (A) NEGATIVE mg/dL   Protein, ur 30 (A) NEGATIVE mg/dL   Nitrite NEGATIVE NEGATIVE   Leukocytes, UA SMALL (A) NEGATIVE  Urine microscopic-add on     Status: Abnormal   Collection Time: 11/19/15  2:20 PM  Result Value Ref Range   Squamous Epithelial / LPF 0-5 (A) NONE SEEN   WBC, UA 6-30 0 - 5 WBC/hpf   RBC / HPF NONE SEEN 0 - 5 RBC/hpf   Bacteria, UA RARE (A) NONE SEEN   Casts HYALINE CASTS (A) NEGATIVE   CT Abd Pelv IMPRESSION: 1. New large volume ascites. No definite peritoneal thickening or nodularity. Diagnostic paracentesis should be considered to exclude malignant ascites. 2. New small right liver lobe mass. Subtle heterogeneity of the liver parenchyma. Liver metastases not excluded. If clinically feasible, further evaluation on an outpatient basis with MRI abdomen with and without intravenous contrast is recommended, preferably immediately following paracentesis. 3. Mild right hydronephrosis, slightly increased since 11/16/2014, with findings suggestive of slight worsening of a mild chronic right UPJ stenosis. Nonobstructing 3 mm stone in the upper right kidney. 4. Extensive sclerotic osseous metastatic disease throughout the visualized skeleton, slightly worse in the left proximal femur, otherwise stable.    Josephina Gip, PA-C 11/19/15 1758

## 2015-11-19 NOTE — ED Notes (Signed)
Patient assisted to rest room. Slow, steady gait. No distress noted.

## 2015-11-19 NOTE — ED Notes (Signed)
Pt reports to the ED for eval of generalized abd pain, distention, and rigidity x several months. She is from home. She has also had diarrhea x several weeks. Pt has hx of breast and bone CA. Pt not currently being treated chemo or radiation. Pt is wears chronic O2 but is unsure what it is for. Pt denies any excessive SOB. She also denies any CP or N/V. Pt A&Ox4, resp e/u, and skin warm and dry.

## 2015-11-19 NOTE — H&P (Signed)
[facility] History and Physical  [name] [id number] DOB: 1936-02-02 DOA: 11/19/2015  Referring physician:  PCP: [name], MD   Chief Complaint: Abdominal distention  HPI: Jamie [name] is a 80 y.o. female with a past medical history of breast cancer diagnosed in 2006 undergoing biopsy that revealed ER and PR positive invasive lobular carcinoma. She underwent left modified radical mastectomy on 11/02/2005, had positive sentinel lymph node and underwent left axillary lymph node dissection. She declined chemotherapy in the past. She underwent radiation therapy of lumbar sacral spine and right hip in 2014. She has been treated with anastrozole and  exemestane Which she did not tolerate, followed by treatment with tamoxifen between 2007 and 2011. She was started on exemestane on 02/25/2015 and everolimus on 03/28/2015. Her last appointment with her primary oncologist was with Dr Jana Hakim on  05/04/2015. Her last bone scan was performed on 06/11/2015 that revealed stable to improved appearance of multifocal bone metastasis. She presents to the emergency department with complaints of abdominal pain associated with distention. Symptoms have been present for the last 2-3 weeks she states. She also reports bilateral extremity pitting edema with associated leg pain. She feels abdominal distention restricting her breathing. She denies fevers, chills, nausea, vomiting, hematemesis, bloody stools. A CT scan of abdomen and pelvis performed in the emergency department revealed new large volume ascites as well as new small right liver lobe mass. Radiology also reported mild right hydronephrosis slightly increased from 11/16/2014. Radiology also noted extensive sclerotic osseous metastatic disease throughout skeleton.                     Review of Systems:  Constitutional:  No weight loss, night sweats, Fevers, chills, positive for fatigue.  HEENT:  No headaches, Difficulty  swallowing,Tooth/dental problems,Sore throat,  No sneezing, itching, ear ache, nasal congestion, post nasal drip,  Cardio-vascular:  No chest pain, Orthopnea, PND, swelling in lower extremities, anasarca, dizziness, palpitations  GI:  No heartburn, indigestion, abdominal pain, nausea, vomiting, diarrhea, change in bowel habits, loss of appetite. Positive for abdominal distention associate with generalized abdominal pain  Resp:  Positive for shortness of breath with exertion or at rest. No excess mucus, no productive cough, No non-productive cough, No coughing up of blood.No change in color of mucus.No wheezing.No chest wall deformity  Skin:  no rash or lesions.  GU:  no dysuria, change in color of urine, no urgency or frequency. No flank pain.  Musculoskeletal:  No joint pain or swelling. No decreased range of motion. No back pain. Positive for lower extremity swelling Psych:  No change in mood or affect. No depression or anxiety. No memory loss.   Past Medical History  Diagnosis Date  . Anxiety   . Asthma   . Breast cancer ([place])     metastasized  . Depression   . Diabetes mellitus   . GERD (gastroesophageal reflux disease)   . Hyperlipidemia   . Internal hemorrhoids   . IBS (irritable bowel syndrome)   . Mitral valve prolapse   . Ischemic colitis ([place])   . Fibromyalgia   . Allergy   . Cataract   . S/P radiation therapy within four to twelve weeks 04/26/13-05/11/13    L-spine/sacrum 30Gy/37fx  . Colon polyp 2013    TUBULAR ADENOMA  . Hypertension    Past Surgical History  Procedure Laterality Date  . Mastectomy Left     then treated with chemo and radiation  . Total abdominal hysterectomy    .  Back surgery      synovial cyst removed from spine  . Carpal tunnel release Bilateral   . Dilation and curettage of uterus    . Appendectomy    . Cataract extraction Bilateral   . Colonoscopy w/ biopsies  2013  . Esophagogastroduodenoscopy  2008    normal   Social History:   reports that she has never smoked. She has never used smokeless tobacco. She reports that she does not drink alcohol or use illicit drugs.  Allergies  Allergen Reactions  . Hydrocodone Anxiety and Other (See Comments)    Pt. Said she is fine with this medication  . Metformin And Related Other (See Comments)    GI sxs  . Morphine And Related Anxiety  . Niacin Other (See Comments)    Dizziness   . Other Other (See Comments)    Salt (results in dizziness)  . Promethazine Hcl Other (See Comments)    Dizziness "FELT CRAZY"     Family History  Problem Relation Age of Onset  . Prostate cancer Brother     x2  . Irritable bowel syndrome Mother   . Colon cancer Neg Hx   . Stroke Father     Prior to Admission medications   Medication Sig Start Date End Date Taking? Authorizing Provider  acetaminophen (TYLENOL) 500 MG tablet Take 500 mg by mouth every 6 (six) hours as needed for mild pain or moderate pain (pain).    Yes Historical Provider, MD  benzonatate (TESSALON) 200 MG capsule Take 200 mg by mouth 3 (three) times daily as needed for cough.  10/23/15  Yes Historical Provider, MD  Cholecalciferol (VITAMIN D) 1000 UNITS capsule Take 1,000 Units by mouth daily.    Yes Historical Provider, MD  clonazePAM (KLONOPIN) 0.5 MG tablet Take 0.5 mg by mouth 2 (two) times daily.    Yes Historical Provider, MD  esomeprazole (NEXIUM) 40 MG capsule Take 1 capsule (40 mg total) by mouth daily. 09/25/14  Yes Jessica D Zehr, PA-C  everolimus (AFINITOR) 10 MG tablet Take 1 tablet (10 mg total) by mouth daily. 03/14/15  Yes Laurie Panda, NP  exemestane (AROMASIN) 25 MG tablet Take 1 tablet (25 mg total) by mouth daily after breakfast. 02/21/15  Yes Chauncey Cruel, MD  GLYCERIN ADULT 2 G suppository Place 1 suppository rectally daily as needed for mild constipation (constipation).  01/21/14  Yes Historical Provider, MD  hydrocortisone (ANUSOL-HC) 2.5 % rectal cream Place 1 application rectally daily as needed  for hemorrhoids or itching.  05/18/13  Yes Shon Baton, MD  metoCLOPramide (REGLAN) 5 MG tablet Take 5 mg by mouth 3 (three) times daily as needed for nausea.  10/16/15  Yes Historical Provider, MD  Polyethyl Glycol-Propyl Glycol (SYSTANE OP) Apply 1-2 drops to eye daily as needed (dry eyes).    Yes Historical Provider, MD  polyethylene glycol (MIRALAX / GLYCOLAX) packet Take 17 g by mouth daily as needed for mild constipation.   Yes Historical Provider, MD  PROAIR HFA 108 (90 BASE) MCG/ACT inhaler Inhale 1-2 puffs into the lungs every 6 (six) hours as needed for wheezing or shortness of breath (wheezing).  05/30/12  Yes Historical Provider, MD  QVAR 80 MCG/ACT inhaler Inhale 2 puffs into the lungs at bedtime.  09/22/14  Yes Historical Provider, MD  traMADol (ULTRAM) 50 MG tablet TAKE 1 TABLET BY MOUTH EVERY 6 HOURS AS NEEDED FOR PAIN Patient taking differently: TAKE 50 MG BY MOUTH EVERY 6 HOURS AS NEEDED FOR  PAIN 03/18/15  Yes Chauncey Cruel, MD   Physical Exam: Filed Vitals:   11/19/15 1700 11/19/15 1715 11/19/15 1730 11/19/15 1815  BP: 157/70 167/74 161/80 143/65  Pulse: 110 109 103 104  Temp:      TempSrc:      Resp: 26 20 26 24   SpO2: 100% 100% 99% 99%    Wt Readings from Last 3 Encounters:  06/23/15 60.328 kg (133 lb)  06/18/15 60.374 kg (133 lb 1.6 oz)  05/06/15 61.598 kg (135 lb 12.8 oz)    General:  Appears calm and comfortable, she seems mildly confused. Nontoxic appearing, otherwise pleasant and cooperative Eyes: PERRL, normal lids, irises & conjunctiva ENT: grossly normal hearing, lips & tongue Neck: no LAD, masses or thyromegaly Cardiovascular: RRR, no m/r/g. No LE edema. Telemetry: SR, no arrhythmias  Respiratory: CTA bilaterally, no w/r/r. Normal respiratory effort. Abdomen: Her abdomen is distended with positive fluid wave, has mild generalized tenderness to palpation Skin: no rash or induration seen on limited exam Musculoskeletal: There is 2+ bilateral lower  extremity pitting edema Psychiatric: grossly normal mood and affect, speech fluent and appropriate Neurologic: grossly non-focal.          Labs on Admission:  Basic Metabolic Panel:  Recent Labs Lab 11/19/15 1054  NA 138  K 3.6  CL 104  CO2 24  GLUCOSE 161*  BUN 9  CREATININE 0.88  CALCIUM 9.1   Liver Function Tests:  Recent Labs Lab 11/19/15 1054  AST 71*  ALT 20  ALKPHOS 365*  BILITOT 2.6*  PROT 5.4*  ALBUMIN 2.7*    Recent Labs Lab 11/19/15 1054  LIPASE 38   No results for input(s): AMMONIA in the last 168 hours. CBC:  Recent Labs Lab 11/19/15 1054 11/19/15 1204  WBC 6.6 3.4*  NEUTROABS 3.5  --   HGB 8.5* 11.2*  HCT 25.2* 34.9*  MCV 110.5* 87.7  PLT 174 139*   Cardiac Enzymes: No results for input(s): CKTOTAL, CKMB, CKMBINDEX, TROPONINI in the last 168 hours.  BNP (last 3 results)  Recent Labs  02/07/15 0540 06/23/15 1034 11/19/15 1054  BNP 119.6* 186.5* 186.2*    ProBNP (last 3 results) No results for input(s): PROBNP in the last 8760 hours.  CBG: No results for input(s): GLUCAP in the last 168 hours.  Radiological Exams on Admission: Ct Head Wo Contrast  11/19/2015  CLINICAL DATA:  Weakness in legs since yesterday, cough for 1 year, confusion, history breast cancer, diabetes mellitus, hypertension EXAM: CT HEAD WITHOUT CONTRAST TECHNIQUE: Contiguous axial images were obtained from the base of the skull through the vertex without intravenous contrast. COMPARISON:  CT head 01/01/2014 FINDINGS: Generalized atrophy. Normal ventricular morphology. No midline shift or mass effect. Otherwise normal appearance of brain parenchyma. No intracranial hemorrhage, mass lesion or evidence acute infarction. No extra-axial fluid collections. Atherosclerotic calcifications at BILATERAL carotid siphons. Bones and sinuses unremarkable. IMPRESSION: No acute intracranial abnormalities. Electronically Signed   By: Lavonia Dana [name].   On: 11/19/2015 15:44   Ct  Abdomen Pelvis W Contrast  11/19/2015  CLINICAL DATA:  Generalized abdominal pain. Bloating, nausea and vomiting. Diarrhea. Breast cancer with bone metastases. EXAM: CT ABDOMEN AND PELVIS WITH CONTRAST TECHNIQUE: Multidetector CT imaging of the abdomen and pelvis was performed using the standard protocol following bolus administration of intravenous contrast. CONTRAST:  25mL OMNIPAQUE IOHEXOL 300 MG/ML  SOLN COMPARISON:  11/16/2014 CT abdomen/ pelvis.  06/23/2015 chest CT. FINDINGS: Lower chest: Stable nodular scarring in the medial right middle  lobe. Stable borderline cardiomegaly. Hepatobiliary: There is a new 0.9 cm hypodense liver lesion in the right lower lobe (series 2/ image 24). There is subtle diffuse heterogeneity of the liver parenchyma. No radiopaque gallstones. No definite gallbladder wall thickening. No biliary ductal dilatation. Pancreas: Normal, with no mass or duct dilation. Spleen: Normal size. No mass. Adrenals/Urinary Tract: Normal adrenals. Nonobstructing 3 mm stone in the upper right kidney. Mild right hydronephrosis with abrupt caliber transition at the right ureteropelvic junction, with normal caliber right ureter. No obstructing right-sided stones. The appearance of the right renal collecting system is similar to the 11/16/2014 CT study, with slightly increased right hydronephrosis. These findings suggest slightly worsening of a mild chronic right UPJ stenosis. No left hydronephrosis. No renal mass. Normal bladder. Stomach/Bowel: Grossly normal stomach. Normal caliber small bowel with no small bowel wall thickening. Status post appendectomy. Normal large bowel with no diverticulosis, large bowel wall thickening or pericolonic fat stranding. Vascular/Lymphatic: Atherosclerotic nonaneurysmal abdominal aorta. Patent portal, splenic, hepatic and renal veins. Mild paraumbilical varix. No pathologically enlarged lymph nodes in the abdomen or pelvis. Reproductive: Status post hysterectomy, with no  abnormal findings at the vaginal cuff. No adnexal mass. Other: No pneumoperitoneum. Large volume ascites. No definite peritoneal thickening or nodularity. Musculoskeletal: Extensive patchy confluent sclerotic osseous metastases are seen throughout the lower thoracic, abdominal and pelvic skeleton, which appears increased in the left proximal femur, and is otherwise not appreciably changed. Marked degenerative changes in the visualized thoracolumbar spine. Mild anasarca. IMPRESSION: 1. New large volume ascites. No definite peritoneal thickening or nodularity. Diagnostic paracentesis should be considered to exclude malignant ascites. 2. New small right liver lobe mass. Subtle heterogeneity of the liver parenchyma. Liver metastases not excluded. If clinically feasible, further evaluation on an outpatient basis with MRI abdomen with and without intravenous contrast is recommended, preferably immediately following paracentesis. 3. Mild right hydronephrosis, slightly increased since 11/16/2014, with findings suggestive of slight worsening of a mild chronic right UPJ stenosis. Nonobstructing 3 mm stone in the upper right kidney. 4. Extensive sclerotic osseous metastatic disease throughout the visualized skeleton, slightly worse in the left proximal femur, otherwise stable. Electronically Signed   By: Ilona Sorrel [name].   On: 11/19/2015 16:07    EKG: Independently reviewed.   Assessment/Plan Principal Problem:   Breast cancer metastasized to bone [facility]) Active Problems:   Ascites   Liver failure (H[facility])   Diabetes mellitus type 2, noninsulin dependent (H[facility])   Abdominal distention   Metastatic breast cancer ([place])   1. Abdominal distention. Patient with history of metastatic breast cancer presenting with complaints of abdominal distention. This was further worked up with a CT scan of abdomen and pelvis in the emergency department that revealed new large volume ascites as well as a new right liver lobe mass. I  highly suspect that this is secondary to metastatic breast cancer. Plan for paracentesis with ascitic fluid to be sent for fluid analysis including cytology. I discussed case with Dr. Alvy Bimler of medical oncology who recommended that I transfer patient to [facility] long hospital where she will be evaluated by her primary oncologist Dr. Jana Hakim in am.  2. History of metastatic breast cancer. She was diagnosed with breast cancer in 2006 with biopsy showing ER and PR positive invasive lobular carcinoma undergoing left modified radical mastectomy in 2007. Bone scan from 2007 showed findings suggestive of metastatic disease. Repeat bone scan in 2012 showing evidence of progression of osseous metastatic disease. She now presents with new large volume ascites as  well as a new right liver lobe mass. This is highly concerning for metastatic disease. Plan to further assess with ultrasound guided paracentesis and half fluid sent for cytology. As I mentioned above case was discussed with Dr. Alvy Bimler of medical oncology recommending that I transfer Mrs. Zaccone to [facility] long hospital where she will be evaluated by Dr. Jana Hakim in am. For now will hold everolimus and aromasin until evaluated by medical oncology to discuss further treatment options. Per medical records it seems that she has declined chemotherapy in the past. 3. History of diabetes mellitus. Plan to perform Accu-Cheks before every meal C and daily at bedtime with slight scale coverage 4. Liver failure. Lab work suggesting liver failure with elevated alkaline phosphatase of 365, AST of 71, ALT of 20, total bilirubin of 2.6. Will check a PT/INR. This likely is secondary to metastatic breast cancer. CT scan performed in the emergency department today showed the development of a right liver lobe mass that is new. Await further recommendations from medical oncology. 5. DVT Proph: Lovenox  Code Status: Full Code Family Communication: Family not present Disposition  Plan: Anticipate patient will require with a 2 nights hospitalization, transferring to [facility] long hospital to be evaluated by medical oncology.  Time spent: 70 min  Kelvin Cellar [facility] Pager [phone]

## 2015-11-19 NOTE — ED Notes (Signed)
Case manager for patient called to up this RN on the patient's baseline.  Deirdre - [phone]

## 2015-11-20 ENCOUNTER — Inpatient Hospital Stay (HOSPITAL_COMMUNITY): Payer: Medicare Other

## 2015-11-20 DIAGNOSIS — C50919 Malignant neoplasm of unspecified site of unspecified female breast: Secondary | ICD-10-CM

## 2015-11-20 DIAGNOSIS — Z7189 Other specified counseling: Secondary | ICD-10-CM

## 2015-11-20 DIAGNOSIS — C799 Secondary malignant neoplasm of unspecified site: Secondary | ICD-10-CM

## 2015-11-20 DIAGNOSIS — C7951 Secondary malignant neoplasm of bone: Secondary | ICD-10-CM

## 2015-11-20 DIAGNOSIS — E119 Type 2 diabetes mellitus without complications: Secondary | ICD-10-CM

## 2015-11-20 DIAGNOSIS — Z515 Encounter for palliative care: Secondary | ICD-10-CM

## 2015-11-20 LAB — BODY FLUID CELL COUNT WITH DIFFERENTIAL
Lymphs, Fluid: 51 %
Monocyte-Macrophage-Serous Fluid: 43 % — ABNORMAL LOW (ref 50–90)
NEUTROPHIL FLUID: 6 % (ref 0–25)
WBC FLUID: 200 uL (ref 0–1000)

## 2015-11-20 LAB — PROTEIN, BODY FLUID: Total protein, fluid: <3 g/dL

## 2015-11-20 LAB — GLUCOSE, CAPILLARY
GLUCOSE-CAPILLARY: 176 mg/dL — AB (ref 65–99)
Glucose-Capillary: 143 mg/dL — ABNORMAL HIGH (ref 65–99)
Glucose-Capillary: 170 mg/dL — ABNORMAL HIGH (ref 65–99)

## 2015-11-20 LAB — CBC
HCT: 22.5 % — ABNORMAL LOW (ref 36.0–46.0)
Hemoglobin: 7.5 g/dL — ABNORMAL LOW (ref 12.0–15.0)
MCH: 36.6 pg — AB (ref 26.0–34.0)
MCHC: 33.3 g/dL (ref 30.0–36.0)
MCV: 109.8 fL — AB (ref 78.0–100.0)
PLATELETS: 162 10*3/uL (ref 150–400)
RBC: 2.05 MIL/uL — AB (ref 3.87–5.11)
RDW: 20.2 % — AB (ref 11.5–15.5)
WBC: 8.5 10*3/uL (ref 4.0–10.5)

## 2015-11-20 LAB — GLUCOSE, SEROUS FLUID: Glucose, Fluid: 173 mg/dL

## 2015-11-20 LAB — LACTATE DEHYDROGENASE, PLEURAL OR PERITONEAL FLUID: LD FL: 56 U/L — AB (ref 3–23)

## 2015-11-20 LAB — COMPREHENSIVE METABOLIC PANEL
ALK PHOS: 360 U/L — AB (ref 38–126)
ALT: 20 U/L (ref 14–54)
AST: 65 U/L — AB (ref 15–41)
Albumin: 2.7 g/dL — ABNORMAL LOW (ref 3.5–5.0)
Anion gap: 9 (ref 5–15)
BUN: 12 mg/dL (ref 6–20)
CALCIUM: 8.5 mg/dL — AB (ref 8.9–10.3)
CO2: 23 mmol/L (ref 22–32)
CREATININE: 0.8 mg/dL (ref 0.44–1.00)
Chloride: 106 mmol/L (ref 101–111)
Glucose, Bld: 185 mg/dL — ABNORMAL HIGH (ref 65–99)
Potassium: 3.6 mmol/L (ref 3.5–5.1)
Sodium: 138 mmol/L (ref 135–145)
Total Bilirubin: 2.6 mg/dL — ABNORMAL HIGH (ref 0.3–1.2)
Total Protein: 5.3 g/dL — ABNORMAL LOW (ref 6.5–8.1)

## 2015-11-20 LAB — PATHOLOGIST SMEAR REVIEW

## 2015-11-20 MED ORDER — ENSURE ENLIVE PO LIQD
237.0000 mL | Freq: Two times a day (BID) | ORAL | Status: DC
  Administered 2015-11-20 – 2015-11-25 (×7): 237 mL via ORAL

## 2015-11-20 NOTE — Consult Note (Signed)
Consultation Note Date: 11/20/2015   Patient Name: Jamie [name]  DOB: 1936-04-25  MRN: [id number]  Age / Sex: 80 y.o., female  PCP: Jamie Baton, MD Referring Physician: Albertine Patricia, MD  Reason for Consultation: Establishing goals of care  Clinical Assessment/Narrative: 80 year old prima past medical history of breast cancer diagnosed in 2006. Status post left modified radical mastectomy January 2007 by multiple courses of disease modifying therapy as well as radiation therapy. She discontinued therapy in summer 2016 and has been enrolled with community hospice since that point in time. She was admitted after presenting to the ED with worsening abdominal pain and distention over the last several weeks.  A CT scan of abdomen and pelvis performed in the emergency department revealed new large volume ascites as well as new small right liver lobe mass. Radiology also reported mild right hydronephrosis slightly increased from 11/16/2014. Radiology also noted extensive sclerotic osseous metastatic disease throughout skeleton.  I met with the patient this morning and she is able to tell me some of her history but is also confused about many of the details.  I called and spoke with her hospice case manager Jamie [name] who reports that they have been helping to care for patient in her home. She has had continued decline in her nutrition, functional status, and cognition and has become apparent that her husband who has multiple medical problems of his own is unable to care for her any longer in this setting. They have elected to revoke hospice as of this morning with the hopes that she can find placement in a skilled facility.  Her husband arrived and I met with her husband as well as the patient. We discussed her clinical course over the past several years as well as the fact that she is not a candidate for further disease modifying  therapy.  Eventual goal is to find a place where she can be cared for long-term while receiving hospice support.  We discussed options for care moving forward including going home with hospice support, having hospice support while privately paying for long-term custodial care, or seeing if she is able to participate in rehabilitation for a period of time to improve her functional status while pursuing a long-term care option.  Her husband reports that they have been talking about applying for Medicaid in order to help cover costs for long-term care as they have very limited resources and requested any assistance of social work and provided to guide him in this process.  Contacts/Participants in Discussion: Patient and her husband  SUMMARY Jamie [name] for PT evaluation to determine her rehabilitation potential - We'll plan to follow-up again tomorrow 2p.m. to continue conversation - Discussed with Dr. Waldron [name] as well as Jamie [name] from social work.  Code Status/Advance Care Planning: DNR    Code Status Orders        Start     Ordered   11/20/15 0820  Do not attempt resuscitation (DNR)   Continuous    Question Answer Comment  In the event of cardiac or respiratory ARREST Do not call a "code blue"   In the event of cardiac or respiratory ARREST Do not perform Intubation, CPR, defibrillation or ACLS   In the event of cardiac or respiratory ARREST Use medication by any route, position, wound care, and other measures to relive pain and suffering. May use oxygen, suction and manual treatment of airway obstruction as needed for comfort.      11/20/15 0820  Code Status History    Date Active Date Inactive Code Status Order ID Comments User Context   11/19/2015  9:10 PM 11/20/2015  8:20 AM Full Code [id number]  Jamie Cellar, MD Inpatient   02/07/2015  5:52 AM 02/09/2015 11:12 PM Full Code [id number]  Rise Patience, MD Inpatient   05/15/2013  8:33 AM 05/18/2013  7:19 PM Full Code  [id number]  [name], MD Inpatient     Symptom Management:   Denies complaints.  Continue same.  Palliative Prophylaxis:   Aspiration, Delirium Protocol and Frequent Pain Assessment  Additional Recommendations (Limitations, Scope, Preferences):  Minimize interventions.  Aggressive symptom managment  Psycho-social/Spiritual:  Support System: Fair Additional Recommendations: Education on Hospice and Grief/Bereavement Support  Prognosis: Weeks to months.  Her expected prognosis would be less than 6 months and she would qualify for hospice  Discharge Planning: To be determined.  Long term goal for hospice, however she may benefit from skilled facility to improve functional status while determining long term plan   Chief Complaint/ Primary Diagnoses: Present on Admission:  . Breast cancer metastasized to bone (Jamie [name]) . Ascites . Abdominal distention . Liver failure (Jamie [name]) . Metastatic breast cancer (Jamie [name])  I have reviewed the medical record, interviewed the patient and family, and examined the patient. The following aspects are pertinent.  Past Medical History  Diagnosis Date  . Anxiety   . Asthma   . Breast cancer ([place])     metastasized  . Depression   . Diabetes mellitus   . GERD (gastroesophageal reflux disease)   . Hyperlipidemia   . Internal hemorrhoids   . IBS (irritable bowel syndrome)   . Mitral valve prolapse   . Ischemic colitis ([place])   . Fibromyalgia   . Allergy   . Cataract   . S/P radiation therapy within four to twelve weeks 04/26/13-05/11/13    L-spine/sacrum 30Gy/11f  . Colon polyp 228-Feb-2013   TUBULAR ADENOMA  . Hypertension    Social History   Social History  . Marital Status: Married    Spouse Name: Jamie [name] . Number of Children: 1  . Years of Education: 14   Occupational History  . Retired    Social History Main Topics  . Smoking status: Never Smoker   . Smokeless tobacco: Never Used  . Alcohol Use: No  . Drug Use: No  . Sexual Activity: Yes     Birth Control/ Protection: Surgical   Other Topics Concern  . None   Social History Narrative   She used to work as a [employer] manager  Her husband Jamie [name], working for a pForensic scientist  What he likes to do is hunt and fish.  Their son Jamie [name] significant muscular dystrophy and died in 202/28/2008 Their daughter [place].  The patient has two granddaughters, both RNs, one working at [employer] [place] in [place]           Lives at home with husband, Jamie [name]    Caffeine use: Drinks 1 drink per day (coffee, tea, or soda)      Family History  Problem Relation Age of Onset  . Prostate cancer Brother     x2  . Irritable bowel syndrome Mother   . Colon cancer Neg Hx   . Stroke Father    Scheduled Meds: . feeding supplement (ENSURE ENLIVE)  237 mL Oral BID BM  . insulin aspart  0-15 Units Subcutaneous TID WC  .  insulin aspart  0-5 Units Subcutaneous QHS  . sodium chloride flush  3 mL Intravenous Q12H   Continuous Infusions:  PRN Meds:.sodium chloride, acetaminophen, alum & mag hydroxide-simeth, bisacodyl, clonazePAM, metoCLOPramide, ondansetron **OR** ondansetron (ZOFRAN) IV, polyethylene glycol, polyvinyl alcohol, sodium chloride flush, traMADol Medications Prior to Admission:  Prior to Admission medications   Medication Sig Start Date End Date Taking? Authorizing Provider  acetaminophen (TYLENOL) 500 MG tablet Take 500 mg by mouth every 6 (six) hours as needed for mild pain or moderate pain (pain).    Yes Historical Provider, MD  benzonatate (TESSALON) 200 MG capsule Take 200 mg by mouth 3 (three) times daily as needed for cough.  10/23/15  Yes Historical Provider, MD  Cholecalciferol (VITAMIN D) 1000 UNITS capsule Take 1,000 Units by mouth daily.    Yes Historical Provider, MD  clonazePAM (KLONOPIN) 0.5 MG tablet Take 0.5 mg by mouth 2 (two) times daily.    Yes Historical Provider, MD  esomeprazole (NEXIUM) 40 MG capsule Take 1 capsule (40  mg total) by mouth daily. 09/25/14  Yes Jessica D Zehr, PA-C  everolimus (AFINITOR) 10 MG tablet Take 1 tablet (10 mg total) by mouth daily. 03/14/15  Yes Laurie Panda, NP  exemestane (AROMASIN) 25 MG tablet Take 1 tablet (25 mg total) by mouth daily after breakfast. 02/21/15  Yes Chauncey Cruel, MD  GLYCERIN ADULT 2 G suppository Place 1 suppository rectally daily as needed for mild constipation (constipation).  01/21/14  Yes Historical Provider, MD  hydrocortisone (ANUSOL-HC) 2.5 % rectal cream Place 1 application rectally daily as needed for hemorrhoids or itching.  05/18/13  Yes Jamie Baton, MD  metoCLOPramide (REGLAN) 5 MG tablet Take 5 mg by mouth 3 (three) times daily as needed for nausea.  10/16/15  Yes Historical Provider, MD  Polyethyl Glycol-Propyl Glycol (SYSTANE OP) Apply 1-2 drops to eye daily as needed (dry eyes).    Yes Historical Provider, MD  polyethylene glycol (MIRALAX / GLYCOLAX) packet Take 17 g by mouth daily as needed for mild constipation.   Yes Historical Provider, MD  PROAIR HFA 108 (90 BASE) MCG/ACT inhaler Inhale 1-2 puffs into the lungs every 6 (six) hours as needed for wheezing or shortness of breath (wheezing).  05/30/12  Yes Historical Provider, MD  QVAR 80 MCG/ACT inhaler Inhale 2 puffs into the lungs at bedtime.  09/22/14  Yes Historical Provider, MD  traMADol (ULTRAM) 50 MG tablet TAKE 1 TABLET BY MOUTH EVERY 6 HOURS AS NEEDED FOR PAIN Patient taking differently: TAKE 50 MG BY MOUTH EVERY 6 HOURS AS NEEDED FOR PAIN 03/18/15  Yes Chauncey Cruel, MD   Allergies  Allergen Reactions  . Hydrocodone Anxiety and Other (See Comments)    Pt. Said she is fine with this medication  . Metformin And Related Other (See Comments)    GI sxs  . Morphine And Related Anxiety  . Niacin Other (See Comments)    Dizziness   . Other Other (See Comments)    Salt (results in dizziness)  . Promethazine Hcl Other (See Comments)    Dizziness "FELT CRAZY"     Review of Systems    Endorses weight gain, fatigue, abdominal pain.  Otherwise 10pt ROS neg.   Physical Exam  General: Alert, awake, in no acute distress. Confused HEENT: No bruits, no goiter, no JVD Heart: Regular rate and rhythm. No murmur appreciated. Lungs: Fair air movement, clear Abdomen: Distended, positive bowel sounds.  Ext: bilateral edema Skin: Warm and dry Neuro: Grossly  intact, nonfocal.  Vital Signs: BP 122/84 mmHg  Pulse 118  Temp(Src) 98.2 F (36.8 C) (Oral)  Resp 20  Ht _0  (1.626 m)  Wt 66.3 kg (146 lb 2.6 oz)  BMI 25.08 kg/m2  SpO2 100%  SpO2: SpO2: 100 % O2 Device:SpO2: 100 % O2 Flow Rate: .O2 Flow Rate (L/min): 2 L/min  IO: Intake/output summary:  Intake/Output Summary (Last 24 hours) at 11/20/15 1924 Last data filed at 11/20/15 1839  Gross per 24 hour  Intake    480 ml  Output      0 ml  Net    480 ml    LBM: Last BM Date:  (PTA) Baseline Weight: Weight: 66.3 kg (146 lb 2.6 oz) Most recent weight: Weight: 66.3 kg (146 lb 2.6 oz)      Palliative Assessment/Data:  Flowsheet Rows        Most Recent Value   Intake Tab    Referral Department  Hospitalist   Unit at Time of Referral  Oncology Unit   Palliative Care Primary Diagnosis  Cancer   Date Notified  11/20/15   Palliative Care Type  New Palliative care   Reason for referral  Clarify Goals of Care   Date of Admission  11/19/15   Date first seen by Palliative Care  11/20/15   # of days Palliative referral response time  0 Day(s)   # of days IP prior to Palliative referral  1   Clinical Assessment    Palliative Performance Scale Score  50%   Pain Max last 24 hours  5   Pain Min Last 24 hours  3   Psychosocial & Spiritual Assessment    Palliative Care Outcomes    Patient/Family meeting held?  Yes   Who was at the meeting?  Patient and her husband   Palliative Care Outcomes  Provided advance care planning      Additional Data Reviewed:  CBC:    Component Value Date/Time   WBC 8.5 11/20/2015  0350   WBC 6.4 06/18/2015 1112   HGB 7.5* 11/20/2015 0350   HGB 8.4* 06/18/2015 1112   HCT 22.5* 11/20/2015 0350   HCT 25.5* 06/18/2015 1112   PLT 162 11/20/2015 0350   PLT 124* 06/18/2015 1112   MCV 109.8* 11/20/2015 0350   MCV 110.8* 06/18/2015 1112   NEUTROABS 3.5 11/19/2015 1054   NEUTROABS 2.7 06/18/2015 1112   LYMPHSABS 1.9 11/19/2015 1054   LYMPHSABS 2.7 06/18/2015 1112   MONOABS 1.1* 11/19/2015 1054   MONOABS 0.9 06/18/2015 1112   EOSABS 0.1 11/19/2015 1054   EOSABS 0.1 06/18/2015 1112   BASOSABS 0 11/19/2015 1054   BASOSABS 0.0 06/18/2015 1112   Comprehensive Metabolic Panel:    Component Value Date/Time   NA 138 11/20/2015 0350   NA 140 06/18/2015 1112   K 3.6 11/20/2015 0350   K 3.9 06/18/2015 1112   CL 106 11/20/2015 0350   CL 103 03/22/2013 0754   CO2 23 11/20/2015 0350   CO2 26 06/18/2015 1112   BUN 12 11/20/2015 0350   BUN 13.5 06/18/2015 1112   CREATININE 0.80 11/20/2015 0350   CREATININE 1.0 06/18/2015 1112   GLUCOSE 185* 11/20/2015 0350   GLUCOSE 188* 06/18/2015 1112   GLUCOSE 149* 03/22/2013 0754   CALCIUM 8.5* 11/20/2015 0350   CALCIUM 10.2 06/18/2015 1112   AST 65* 11/20/2015 0350   AST 65* 06/18/2015 1112   ALT 20 11/20/2015 0350   ALT 28 06/18/2015 1112   ALKPHOS  360* 11/20/2015 0350   ALKPHOS 359* 06/18/2015 1112   BILITOT 2.6* 11/20/2015 0350   BILITOT 0.98 06/18/2015 1112   PROT 5.3* 11/20/2015 0350   PROT 6.6 06/18/2015 1112   ALBUMIN 2.7* 11/20/2015 0350   ALBUMIN 3.6 06/18/2015 1112     Time In: 1225 Time Out: 1350 Time Total: 85 Greater than 50%  of this time was spent counseling and coordinating care related to the above assessment and plan.  Signed by: Micheline Rough, MD  Micheline Rough, MD  11/20/2015, 7:24 PM  Please contact Palliative Medicine Team phone at [phone] for questions and concerns.

## 2015-11-20 NOTE — Procedures (Signed)
Ultrasound-guided diagnostic and therapeutic paracentesis performed yielding 5 liters of clear yellow colored fluid. No immediate complications.Jamie [name] E 12:30 PM 11/20/2015

## 2015-11-20 NOTE — Progress Notes (Signed)
COURTESY NOTE: Recent events noted. Suspect peritoneal carcinomatosis despite this not being prominent on CT. She is at risk for bowel, biliary and ureteral obstruction. However I do not see an immediate cause of death and she could live many months or succumb quickly to infection, thrombosis or other catastrophic event. She may require repeated paracenteses if she survives.  The patient speaks bitterly about her husband. She also tells me he is going to have CABG in March. She cannot stay home by herself. I would favor SNF for this patient is Hospice Residence cannot be arranged for.   80 y.o. [place] woman   (1) status post left modified radical mastectomy in January 2007 for a T1c N1, stage IIA invasive lobular carcinoma, grade 1, strongly estrogen and progesterone receptor positive, HER-2 negative, with an MIB-1 of 7%.  (2) Status post radiation given concurrently with Capecitabine. Declined chemotherapy.   (3) Status post anastrozole and exemestane, both discontinued due to aches and pains. Status post tamoxifen between October 2007 until August 2011, discontinued secondary to cramps   (4) multiple sclerotic bony lesions noted at initial diagnosis, biopsied x2 August 2009 without malignancy documented, on zoledronic acid annually starting 12/12/2007. Increased to monthly starting 07/17/2013  (5) On fulvestrant between September 2011 and 05/17/2013 with likely progression of bony disease  (6) radiation to the lumbosacral spine and right hip to treat right hip pain to 30 gray completed 71/59/5396, complicated by [possible] radiation induced colitis  (7) on letrozole since September 2014; discontinued May 2016 with progression  (8) received zoledronic acid monthly basis beginning September 2014, after May 2015 switched to denosumab, switched back to zolendronate 05/23/2014 to be given every 12 weeks (most recent dose 06/18/15)  (9) exemestane stated 02/25/15; everolimus added  03/28/2015-- not clear when discontinued

## 2015-11-20 NOTE — Progress Notes (Signed)
Dressing to abdomen,clean,dry, intact. S/p paracentesis earlier.Will continue to monitor.

## 2015-11-20 NOTE — Progress Notes (Signed)
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 Admission information has not been completed.

## 2015-11-20 NOTE — Progress Notes (Signed)
Appreciate Hospitalists taking care of my Primary pt. She is already enrolled with [facility] and we have had several end of life conversations. She is and should be/remain a DNR She has had progressive Breast Cancer c mets despite prior and current treatments. From the right ups and the CT scan it appears she has a very poor prognosis going forward.  I added CA27.29 as it was CA 27.29             [H]  1666.7 U/mL                 0.0-38.6 on 12/19 up from 800-900 from October.

## 2015-11-20 NOTE — Progress Notes (Signed)
Pt confused, pt pulled IV out, IV team notified and attempted to place IV, pt refused, pt also refused labs to be drawn, on call provider notified. Will continue to monitor.

## 2015-11-20 NOTE — Progress Notes (Signed)
Patient Demographics  Jamie [name], is a 80 y.o. female, DOB - January 25, 1936, [id number]  Admit date - 11/19/2015   Admitting Physician Kelvin Cellar, MD  Outpatient Primary MD for the patient is [name], MD  LOS - 1   Chief Complaint  Patient presents with  . Abdominal Pain       Admission HPI/Brief narrative: 80 year old female with metastatic breast cancer to bones, status post chemotherapy and radiation, with abdominal pain, distention, workup significant for liver metastases, ascites.  Subjective:   Jamie [name] today has, No headache, No chest pain, complaints of abdominal pain- No Nausea,  No Cough - SOB.   Assessment & Plan    Principal Problem:   Breast cancer metastasized to bone [facility]) Active Problems:   Diabetes mellitus type 2, noninsulin dependent (HCC)   Ascites   Abdominal distention   Liver failure (HCC)   Metastatic breast cancer (HCC)   malignant ascites - Patient with known history of metastatic breast cancer, appears to be involving the liver, resents with significant abdominal distention, imaging significant for ascites, patient had ultrasound-guided paracentesis with total of 5 L drained.  Metastatic breast cancer - Discussed with Dr. Teena Dunk, patient is not a candidate for any further treatment, palliative care consulted, 's custody with patient and husband, plan is for symptom management, avoid unnecessary labs and workup. - Continue with when necessary pain medication.  Diabetes mellitus - Continue with insulin sliding scale  Elevated LFTs - Secondary to liver metastases, no further workup   Anemia - In the setting of chronic illness and malignancy, no indication for transfusion  Failure to thrive /Protein calorie malnutrition -We'll start ensure - We'll consult PT  Code Status: DO NOT RESUSCITATE  Family Communication:  D/W husband at  bedside  Disposition Plan: awaiting PT consult   Procedures  Ultrasound-guided paracentesis with 5 L drained 11/20/2015   Consults   Palliative medicine   Medications  Scheduled Meds: . enoxaparin (LOVENOX) injection  40 mg Subcutaneous Q24H  . insulin aspart  0-15 Units Subcutaneous TID WC  . insulin aspart  0-5 Units Subcutaneous QHS  . sodium chloride flush  3 mL Intravenous Q12H   Continuous Infusions:  PRN Meds:.sodium chloride, acetaminophen, alum & mag hydroxide-simeth, bisacodyl, clonazePAM, metoCLOPramide, ondansetron **OR** ondansetron (ZOFRAN) IV, polyethylene glycol, polyvinyl alcohol, sodium chloride flush, traMADol  DVT Prophylaxis  SCDs  Lab Results  Component Value Date   PLT 162 11/20/2015    Antibiotics    Anti-infectives    None          Objective:   Filed Vitals:   11/20/15 1130 11/20/15 1135 11/20/15 1140 11/20/15 1149  BP: 158/56 152/50 154/50 152/50  Pulse:      Temp:      TempSrc:      Resp:      Height:      Weight:      SpO2:        Wt Readings from Last 3 Encounters:  11/19/15 66.3 kg (146 lb 2.6 oz)  06/23/15 60.328 kg (133 lb)  06/18/15 60.374 kg (133 lb 1.6 oz)    No intake or output data in the 24 hours ending 11/20/15 1411   Physical Exam  Awake Alert, Oriented  [place].AT,PERRAL Supple Neck,No JVD,  Symmetrical Chest wall movement, Good air movement bilaterally, CTAB RRR,No Gallops,Rubs or new Murmurs, No Parasternal Heave +ve B.Sounds, abdomen distended with significant ascites,  No rebound - guarding or rigidity. No Cyanosis, Clubbing or edema, No new Rash or bruise    Data Review   Micro Results Recent Results (from the past 240 hour(s))  Urine culture     Status: None (Preliminary result)   Collection Time: 11/19/15  2:20 PM  Result Value Ref Range Status   Specimen Description URINE, CLEAN CATCH  Final   Special Requests NONE  Final   Culture 50,000 COLONIES/mL ESCHERICHIA COLI  Final   Report Status  PENDING  Incomplete    Radiology Reports Dg Chest 2 View  10/28/2015  CLINICAL DATA:  Nonproductive cough for 6-8 weeks EXAM: CHEST  2 VIEW COMPARISON:  CT chest 06/23/2015, chest x-ray 06/23/2015 FINDINGS: There is mild bilateral chronic interstitial thickening. There is no focal parenchymal opacity. There is no pleural effusion or pneumothorax. The heart and mediastinal contours are unremarkable. There is right rotator cuff calcific tendinosis. IMPRESSION: No active cardiopulmonary disease. Electronically Signed   By: Kathreen Devoid   On: 10/28/2015 13:28   Ct Head Wo Contrast  11/19/2015  CLINICAL DATA:  Weakness in legs since yesterday, cough for 1 year, confusion, history breast cancer, diabetes mellitus, hypertension EXAM: CT HEAD WITHOUT CONTRAST TECHNIQUE: Contiguous axial images were obtained from the base of the skull through the vertex without intravenous contrast. COMPARISON:  CT head 01/01/2014 FINDINGS: Generalized atrophy. Normal ventricular morphology. No midline shift or mass effect. Otherwise normal appearance of brain parenchyma. No intracranial hemorrhage, mass lesion or evidence acute infarction. No extra-axial fluid collections. Atherosclerotic calcifications at BILATERAL carotid siphons. Bones and sinuses unremarkable. IMPRESSION: No acute intracranial abnormalities. Electronically Signed   By: Lavonia Dana [name].   On: 11/19/2015 15:44   Ct Abdomen Pelvis W Contrast  11/19/2015  CLINICAL DATA:  Generalized abdominal pain. Bloating, nausea and vomiting. Diarrhea. Breast cancer with bone metastases. EXAM: CT ABDOMEN AND PELVIS WITH CONTRAST TECHNIQUE: Multidetector CT imaging of the abdomen and pelvis was performed using the standard protocol following bolus administration of intravenous contrast. CONTRAST:  29mL OMNIPAQUE IOHEXOL 300 MG/ML  SOLN COMPARISON:  11/16/2014 CT abdomen/ pelvis.  06/23/2015 chest CT. FINDINGS: Lower chest: Stable nodular scarring in the medial right middle lobe.  Stable borderline cardiomegaly. Hepatobiliary: There is a new 0.9 cm hypodense liver lesion in the right lower lobe (series 2/ image 24). There is subtle diffuse heterogeneity of the liver parenchyma. No radiopaque gallstones. No definite gallbladder wall thickening. No biliary ductal dilatation. Pancreas: Normal, with no mass or duct dilation. Spleen: Normal size. No mass. Adrenals/Urinary Tract: Normal adrenals. Nonobstructing 3 mm stone in the upper right kidney. Mild right hydronephrosis with abrupt caliber transition at the right ureteropelvic junction, with normal caliber right ureter. No obstructing right-sided stones. The appearance of the right renal collecting system is similar to the 11/16/2014 CT study, with slightly increased right hydronephrosis. These findings suggest slightly worsening of a mild chronic right UPJ stenosis. No left hydronephrosis. No renal mass. Normal bladder. Stomach/Bowel: Grossly normal stomach. Normal caliber small bowel with no small bowel wall thickening. Status post appendectomy. Normal large bowel with no diverticulosis, large bowel wall thickening or pericolonic fat stranding. Vascular/Lymphatic: Atherosclerotic nonaneurysmal abdominal aorta. Patent portal, splenic, hepatic and renal veins. Mild paraumbilical varix. No pathologically enlarged lymph nodes in the abdomen or pelvis. Reproductive: Status post hysterectomy, with no  abnormal findings at the vaginal cuff. No adnexal mass. Other: No pneumoperitoneum. Large volume ascites. No definite peritoneal thickening or nodularity. Musculoskeletal: Extensive patchy confluent sclerotic osseous metastases are seen throughout the lower thoracic, abdominal and pelvic skeleton, which appears increased in the left proximal femur, and is otherwise not appreciably changed. Marked degenerative changes in the visualized thoracolumbar spine. Mild anasarca. IMPRESSION: 1. New large volume ascites. No definite peritoneal thickening or  nodularity. Diagnostic paracentesis should be considered to exclude malignant ascites. 2. New small right liver lobe mass. Subtle heterogeneity of the liver parenchyma. Liver metastases not excluded. If clinically feasible, further evaluation on an outpatient basis with MRI abdomen with and without intravenous contrast is recommended, preferably immediately following paracentesis. 3. Mild right hydronephrosis, slightly increased since 11/16/2014, with findings suggestive of slight worsening of a mild chronic right UPJ stenosis. Nonobstructing 3 mm stone in the upper right kidney. 4. Extensive sclerotic osseous metastatic disease throughout the visualized skeleton, slightly worse in the left proximal femur, otherwise stable. Electronically Signed   By: Ilona Sorrel [name].   On: 11/19/2015 16:07   US Paracentesis  11/20/2015  INDICATION: H/o Breast cancer with new liver mass and abdominal ascites. Request was made for paracentesis EXAM: ULTRASOUND GUIDED right mid quadrant PARACENTESIS MEDICATIONS: 1% lidocaine COMPLICATIONS: None immediate. PROCEDURE: Informed written consent was obtained from the patient's husband due to patient confusion and dementia, after a discussion of the risks, benefits and alternatives to treatment. A timeout was performed prior to the initiation of the procedure. Initial ultrasound scanning demonstrates a moderate amount of ascites within the right mid abdominal quadrant. The right mid abdomen was prepped and draped in the usual sterile fashion. 1% lidocaine was used for local anesthesia. Following this, a 7 cm Yueh catheter was introduced. An ultrasound image was saved for documentation purposes. The paracentesis was performed. The catheter was removed and a dressing was applied. The patient tolerated the procedure well without immediate post procedural complication. FINDINGS: A total of approximately 5 Liters of clear yellow fluid was removed. Samples were sent to the laboratory as  requested by the clinical team. IMPRESSION: Successful ultrasound-guided paracentesis yielding 5 liters of peritoneal fluid. Read by: Saverio Danker, PA-C Electronically Signed   By: Jacqulynn Cadet [name].   On: 11/20/2015 12:29     CBC  Recent Labs Lab 11/19/15 1054 11/19/15 1204 11/20/15 0350  WBC 6.6 3.4* 8.5  HGB 8.5* 11.2* 7.5*  HCT 25.2* 34.9* 22.5*  PLT 174 139* 162  MCV 110.5* 87.7 109.8*  MCH 37.3* 28.1 36.6*  MCHC 33.7 32.1 33.3  RDW 20.4* 14.9 20.2*  LYMPHSABS 1.9  --   --   MONOABS 1.1*  --   --   EOSABS 0.1  --   --   BASOSABS 0  --   --     Chemistries   Recent Labs Lab 11/19/15 1054 11/20/15 0350  NA 138 138  K 3.6 3.6  CL 104 106  CO2 24 23  GLUCOSE 161* 185*  BUN 9 12  CREATININE 0.88 0.80  CALCIUM 9.1 8.5*  AST 71* 65*  ALT 20 20  ALKPHOS 365* 360*  BILITOT 2.6* 2.6*   ------------------------------------------------------------------------------------------------------------------ estimated creatinine clearance is 55.1 mL/min (by C-G formula based on Cr of 0.8). ------------------------------------------------------------------------------------------------------------------ No results for input(s): HGBA1C in the last 72 hours. ------------------------------------------------------------------------------------------------------------------ No results for input(s): CHOL, HDL, LDLCALC, TRIG, CHOLHDL, LDLDIRECT in the last 72 hours. ------------------------------------------------------------------------------------------------------------------ No results for input(s): TSH, T4TOTAL, T3FREE, THYROIDAB in the last  72 hours.  Invalid input(s): FREET3 ------------------------------------------------------------------------------------------------------------------ No results for input(s): VITAMINB12, FOLATE, FERRITIN, TIBC, IRON, RETICCTPCT in the last 72 hours.  Coagulation profile No results for input(s): INR, PROTIME in the last 168  hours.  No results for input(s): DDIMER in the last 72 hours.  Cardiac Enzymes No results for input(s): CKMB, TROPONINI, MYOGLOBIN in the last 168 hours.  Invalid input(s): CK ------------------------------------------------------------------------------------------------------------------ Invalid input(s): POCBNP     Time Spent in minutes   25 minutes   Jacqulyne Gladue [name] on 11/20/2015 at 2:11 PM  Between 7am to 7pm - Pager - [phone]  After 7pm go to [email or web address] - password [facility]  [facility]   Office  [phone]

## 2015-11-21 DIAGNOSIS — D638 Anemia in other chronic diseases classified elsewhere: Secondary | ICD-10-CM

## 2015-11-21 DIAGNOSIS — Z515 Encounter for palliative care: Secondary | ICD-10-CM | POA: Insufficient documentation

## 2015-11-21 DIAGNOSIS — R160 Hepatomegaly, not elsewhere classified: Secondary | ICD-10-CM

## 2015-11-21 DIAGNOSIS — Z7189 Other specified counseling: Secondary | ICD-10-CM | POA: Insufficient documentation

## 2015-11-21 LAB — GLUCOSE, CAPILLARY
GLUCOSE-CAPILLARY: 157 mg/dL — AB (ref 65–99)
Glucose-Capillary: 136 mg/dL — ABNORMAL HIGH (ref 65–99)
Glucose-Capillary: 140 mg/dL — ABNORMAL HIGH (ref 65–99)
Glucose-Capillary: 198 mg/dL — ABNORMAL HIGH (ref 65–99)
Glucose-Capillary: 207 mg/dL — ABNORMAL HIGH (ref 65–99)

## 2015-11-21 LAB — CBC
HEMATOCRIT: 20 % — AB (ref 36.0–46.0)
Hemoglobin: 6.8 g/dL — CL (ref 12.0–15.0)
MCH: 37 pg — AB (ref 26.0–34.0)
MCHC: 34 g/dL (ref 30.0–36.0)
MCV: 108.7 fL — ABNORMAL HIGH (ref 78.0–100.0)
Platelets: 151 10*3/uL (ref 150–400)
RBC: 1.84 MIL/uL — ABNORMAL LOW (ref 3.87–5.11)
RDW: 20.1 % — AB (ref 11.5–15.5)
WBC: 6.6 10*3/uL (ref 4.0–10.5)

## 2015-11-21 LAB — URINE CULTURE: Culture: 50000

## 2015-11-21 LAB — PREPARE RBC (CROSSMATCH)

## 2015-11-21 MED ORDER — SODIUM CHLORIDE 0.9 % IV SOLN
Freq: Once | INTRAVENOUS | Status: AC
  Administered 2015-11-21: 12:00:00 via INTRAVENOUS

## 2015-11-21 NOTE — Clinical Social Work Placement (Signed)
   CLINICAL SOCIAL WORK PLACEMENT  NOTE  Date:  11/21/2015  Patient Details  Name: JANEESE KINKADE MRN: [id number] Date of Birth: Aug 18, 1936  Clinical Social Work is seeking post-discharge placement for this patient at the [place] level of care (*CSW will initial, date and re-position this form in  chart as items are completed):  Yes   Patient/family provided with [place] Work Department's list of facilities offering this level of care within the geographic area requested by the patient (or if unable, by the patient's family).  Yes   Patient/family informed of their freedom to choose among providers that offer the needed level of care, that participate in Medicare, Medicaid or managed care program needed by the patient, have an available bed and are willing to accept the patient.  Yes   Patient/family informed of [facility]'s ownership interest in [facility] and [facility], as well as of the fact that they are under no obligation to receive care at these facilities.  PASRR submitted to EDS on 11/21/15     PASRR number received on       Existing PASRR number confirmed on       FL2 transmitted to all facilities in geographic area requested by pt/family on       FL2 transmitted to all facilities within larger geographic area on       Patient informed that his/her managed care company has contracts with or will negotiate with certain facilities, including the following:        Yes   Patient/family informed of bed offers received.  Patient chooses bed at [facility]     Physician recommends and patient chooses bed at      Patient to be transferred to [facility] on  .  Patient to be transferred to facility by       Patient family notified on   of transfer.  Name of family member notified:        PHYSICIAN Please sign FL2, Please sign DNR     Additional Comment:     _______________________________________________ Ladell Pier, LCSW 11/21/2015, 3:36 PM

## 2015-11-21 NOTE — Consult Note (Signed)
   [facility] Inpatient Consult   11/21/2015  Jamie [name] [birth year] [id number]   [facility] Care Management follow up on EPIC referral. Spoke with family at bedside after family meeting with Palliative Medicine. Spoke with inpatient Licensed CSW who indicates patient could really benefit from [facility] follow up at [facility]. Met with patient and family at bedside to discuss and explain [facility] Care Management services. They are in agreement for [facility] Licensed follow up at [facility]. Inpatient Licensed CSW indicated family may need assist in developing long term plan. Will make referral to [place]. [name] indicates he would rather for his daughter's be called instead of him for [facility] follow up. Primary contact should be [name] (who was not present) at [phone], daughter. [name]  (daughter) was present du ring bedside visit and she asked that [name] be called initially as she is a Psychiatric nurse and will be very busy during Valentine's season. [name] number is [phone]. [name] is the husband and also HCPOA and his main contact number is [phone]. However, he defers follow up calls to daughters. Will pass this information along. Patient to discharge to [facility]. Written consent obtained. Inpatient Licensed CSW made aware [facility] to follow up.   [name], MSN-Ed, RN,BSN [facility] Liaison [phone]

## 2015-11-21 NOTE — Progress Notes (Signed)
Patient Demographics  Jamie [name], is a 80 y.o. female, DOB - [birth year], [id number]  Admit date - 11/19/2015   Admitting Physician Kelvin Cellar, MD  Outpatient Primary MD for the patient is [name], MD  LOS - 2   Chief Complaint  Patient presents with  . Abdominal Pain       Admission HPI/Brief narrative: 80 year old female with metastatic breast cancer to bones, status post chemotherapy and radiation, with abdominal pain, distention, workup significant for liver metastases, ascites, status post therapeutic paracentesis 11/20/2015 with 5 L drained.  Subjective:   Jamie [name] today has, No headache, No chest pain, complaints of abdominal pain- No Nausea,  No Cough - SOB.   Assessment & Plan    Principal Problem:   Breast cancer metastasized to bone [facility]) Active Problems:   Diabetes mellitus type 2, noninsulin dependent (HCC)   Ascites   Abdominal distention   Liver failure (HCC)   Metastatic breast cancer (HCC)   malignant ascites - Patient with known history of metastatic breast cancer, appears to be involving the liver, resents with significant abdominal distention, imaging significant for ascites, patient had ultrasound-guided paracentesis with total of 5 L drained 11/20/2015.  Metastatic breast cancer - Discussed with Dr. Teena Dunk, patient is not a candidate for any further treatment, palliative care consulted, 's custody with patient and husband, plan is for symptom management, avoid unnecessary labs and workup. - Continue with when necessary pain medication.  Diabetes mellitus - Continue with insulin sliding scale  Elevated LFTs - Secondary to liver metastases, no further workup   Anemia - In the setting of chronic illness and malignancy, glycohemoglobin is 6.8 today, will transfuse 1 unit PRBC.  Positive urinalysis - Patient denies any dysuria or polyuria,  symptomatic, urine culture growing Escherichia coli, but is 50,000 colonies, no indication for treatment.  Failure to thrive /Protein calorie malnutrition -We'll start ensure - We'll consult PT  Code Status: DO NOT RESUSCITATE  Family Communication:  D/W husband at bedside  Disposition Plan:We'll need SNF placement, hopefully in 24 hours  Procedures  Ultrasound-guided paracentesis with 5 L drained 11/20/2015 1 unit PRBC 11/21/2015  Consults   Palliative medicine   Medications  Scheduled Meds: . feeding supplement (ENSURE ENLIVE)  237 mL Oral BID BM  . insulin aspart  0-15 Units Subcutaneous TID WC  . insulin aspart  0-5 Units Subcutaneous QHS  . sodium chloride flush  3 mL Intravenous Q12H   Continuous Infusions:  PRN Meds:.sodium chloride, acetaminophen, alum & mag hydroxide-simeth, bisacodyl, clonazePAM, metoCLOPramide, ondansetron **OR** ondansetron (ZOFRAN) IV, polyethylene glycol, polyvinyl alcohol, sodium chloride flush, traMADol  DVT Prophylaxis  SCDs  Lab Results  Component Value Date   PLT 151 11/21/2015    Antibiotics    Anti-infectives    None          Objective:   Filed Vitals:   11/20/15 2240 11/21/15 0533 11/21/15 1130 11/21/15 1145  BP: 135/47 131/55 156/52 153/55  Pulse: 113 92 103 100  Temp: 97.9 F (36.6 C) 97.7 F (36.5 C) 98.2 F (36.8 C) 97.8 F (36.6 C)  TempSrc: Oral Oral Oral Oral  Resp: 18 18 20 20   Height:      Weight:      SpO2:  100% 99% 100% 100%    Wt Readings from Last 3 Encounters:  11/19/15 66.3 kg (146 lb 2.6 oz)  06/23/15 60.328 kg (133 lb)  06/18/15 60.374 kg (133 lb 1.6 oz)     Intake/Output Summary (Last 24 hours) at 11/21/15 1427 Last data filed at 11/21/15 1145  Gross per 24 hour  Intake   1050 ml  Output    200 ml  Net    850 ml     Physical Exam  Awake Alert, Oriented  [place].AT,PERRAL Supple Neck,No JVD,  Symmetrical Chest wall movement, Good air movement bilaterally, CTAB RRR,No Gallops,Rubs or  new Murmurs, No Parasternal Heave +ve B.Sounds,abdomen significantly less distended after paracentesis,  No rebound - guarding or rigidity. No Cyanosis, Clubbing or edema, No new Rash or bruise    Data Review   Micro Results Recent Results (from the past 240 hour(s))  Urine culture     Status: None   Collection Time: 11/19/15  2:20 PM  Result Value Ref Range Status   Specimen Description URINE, CLEAN CATCH  Final   Special Requests NONE  Final   Culture 50,000 COLONIES/mL ESCHERICHIA COLI  Final   Report Status 11/21/2015 FINAL  Final   Organism ID, Bacteria ESCHERICHIA COLI  Final      Susceptibility   Escherichia coli - MIC*    AMPICILLIN <=2 SENSITIVE Sensitive     CEFAZOLIN <=4 SENSITIVE Sensitive     CEFTRIAXONE <=1 SENSITIVE Sensitive     CIPROFLOXACIN <=0.25 SENSITIVE Sensitive     GENTAMICIN <=1 SENSITIVE Sensitive     IMIPENEM <=0.25 SENSITIVE Sensitive     NITROFURANTOIN <=16 SENSITIVE Sensitive     TRIMETH/SULFA <=20 SENSITIVE Sensitive     AMPICILLIN/SULBACTAM <=2 SENSITIVE Sensitive     PIP/TAZO <=4 SENSITIVE Sensitive     * 50,000 COLONIES/mL ESCHERICHIA COLI  Anaerobic culture     Status: None (Preliminary result)   Collection Time: 11/20/15 12:21 PM  Result Value Ref Range Status   Specimen Description PERITONEAL  Final   Special Requests NONE  Final   Gram Stain   Final    MODERATE WBC PRESENT, PREDOMINANTLY MONONUCLEAR NO ORGANISMS SEEN Performed at [facility]    Culture PENDING  Incomplete   Report Status PENDING  Incomplete  Body fluid culture     Status: None (Preliminary result)   Collection Time: 11/20/15 12:21 PM  Result Value Ref Range Status   Specimen Description PERITONEAL  Final   Special Requests NONE  Final   Gram Stain   Final    MODERATE WBC PRESENT, PREDOMINANTLY MONONUCLEAR NO ORGANISMS SEEN    Culture   Final    NO GROWTH < 24 HOURS Performed at [facility]    Report Status PENDING  Incomplete    Radiology  Reports Dg Chest 2 View  10/28/2015  CLINICAL DATA:  Nonproductive cough for 6-8 weeks EXAM: CHEST  2 VIEW COMPARISON:  CT chest 06/23/2015, chest x-ray 06/23/2015 FINDINGS: There is mild bilateral chronic interstitial thickening. There is no focal parenchymal opacity. There is no pleural effusion or pneumothorax. The heart and mediastinal contours are unremarkable. There is right rotator cuff calcific tendinosis. IMPRESSION: No active cardiopulmonary disease. Electronically Signed   By: Kathreen Devoid   On: 10/28/2015 13:28   Ct Head Wo Contrast  11/19/2015  CLINICAL DATA:  Weakness in legs since yesterday, cough for 1 year, confusion, history breast cancer, diabetes mellitus, hypertension EXAM: CT HEAD WITHOUT CONTRAST TECHNIQUE: Contiguous axial  images were obtained from the base of the skull through the vertex without intravenous contrast. COMPARISON:  CT head 01/01/2014 FINDINGS: Generalized atrophy. Normal ventricular morphology. No midline shift or mass effect. Otherwise normal appearance of brain parenchyma. No intracranial hemorrhage, mass lesion or evidence acute infarction. No extra-axial fluid collections. Atherosclerotic calcifications at BILATERAL carotid siphons. Bones and sinuses unremarkable. IMPRESSION: No acute intracranial abnormalities. Electronically Signed   By: Lavonia Dana [name].   On: 11/19/2015 15:44   Ct Abdomen Pelvis W Contrast  11/19/2015  CLINICAL DATA:  Generalized abdominal pain. Bloating, nausea and vomiting. Diarrhea. Breast cancer with bone metastases. EXAM: CT ABDOMEN AND PELVIS WITH CONTRAST TECHNIQUE: Multidetector CT imaging of the abdomen and pelvis was performed using the standard protocol following bolus administration of intravenous contrast. CONTRAST:  6mL OMNIPAQUE IOHEXOL 300 MG/ML  SOLN COMPARISON:  11/16/2014 CT abdomen/ pelvis.  06/23/2015 chest CT. FINDINGS: Lower chest: Stable nodular scarring in the medial right middle lobe. Stable borderline cardiomegaly.  Hepatobiliary: There is a new 0.9 cm hypodense liver lesion in the right lower lobe (series 2/ image 24). There is subtle diffuse heterogeneity of the liver parenchyma. No radiopaque gallstones. No definite gallbladder wall thickening. No biliary ductal dilatation. Pancreas: Normal, with no mass or duct dilation. Spleen: Normal size. No mass. Adrenals/Urinary Tract: Normal adrenals. Nonobstructing 3 mm stone in the upper right kidney. Mild right hydronephrosis with abrupt caliber transition at the right ureteropelvic junction, with normal caliber right ureter. No obstructing right-sided stones. The appearance of the right renal collecting system is similar to the 11/16/2014 CT study, with slightly increased right hydronephrosis. These findings suggest slightly worsening of a mild chronic right UPJ stenosis. No left hydronephrosis. No renal mass. Normal bladder. Stomach/Bowel: Grossly normal stomach. Normal caliber small bowel with no small bowel wall thickening. Status post appendectomy. Normal large bowel with no diverticulosis, large bowel wall thickening or pericolonic fat stranding. Vascular/Lymphatic: Atherosclerotic nonaneurysmal abdominal aorta. Patent portal, splenic, hepatic and renal veins. Mild paraumbilical varix. No pathologically enlarged lymph nodes in the abdomen or pelvis. Reproductive: Status post hysterectomy, with no abnormal findings at the vaginal cuff. No adnexal mass. Other: No pneumoperitoneum. Large volume ascites. No definite peritoneal thickening or nodularity. Musculoskeletal: Extensive patchy confluent sclerotic osseous metastases are seen throughout the lower thoracic, abdominal and pelvic skeleton, which appears increased in the left proximal femur, and is otherwise not appreciably changed. Marked degenerative changes in the visualized thoracolumbar spine. Mild anasarca. IMPRESSION: 1. New large volume ascites. No definite peritoneal thickening or nodularity. Diagnostic paracentesis  should be considered to exclude malignant ascites. 2. New small right liver lobe mass. Subtle heterogeneity of the liver parenchyma. Liver metastases not excluded. If clinically feasible, further evaluation on an outpatient basis with MRI abdomen with and without intravenous contrast is recommended, preferably immediately following paracentesis. 3. Mild right hydronephrosis, slightly increased since 11/16/2014, with findings suggestive of slight worsening of a mild chronic right UPJ stenosis. Nonobstructing 3 mm stone in the upper right kidney. 4. Extensive sclerotic osseous metastatic disease throughout the visualized skeleton, slightly worse in the left proximal femur, otherwise stable. Electronically Signed   By: Ilona Sorrel [name].   On: 11/19/2015 16:07   US Paracentesis  11/20/2015  INDICATION: H/o Breast cancer with new liver mass and abdominal ascites. Request was made for paracentesis EXAM: ULTRASOUND GUIDED right mid quadrant PARACENTESIS MEDICATIONS: 1% lidocaine COMPLICATIONS: None immediate. PROCEDURE: Informed written consent was obtained from the patient's husband due to patient confusion and dementia, after a discussion  of the risks, benefits and alternatives to treatment. A timeout was performed prior to the initiation of the procedure. Initial ultrasound scanning demonstrates a moderate amount of ascites within the right mid abdominal quadrant. The right mid abdomen was prepped and draped in the usual sterile fashion. 1% lidocaine was used for local anesthesia. Following this, a 7 cm Yueh catheter was introduced. An ultrasound image was saved for documentation purposes. The paracentesis was performed. The catheter was removed and a dressing was applied. The patient tolerated the procedure well without immediate post procedural complication. FINDINGS: A total of approximately 5 Liters of clear yellow fluid was removed. Samples were sent to the laboratory as requested by the clinical team.  IMPRESSION: Successful ultrasound-guided paracentesis yielding 5 liters of peritoneal fluid. Read by: Saverio Danker, PA-C Electronically Signed   By: Jacqulynn Cadet [name].   On: 11/20/2015 12:29     CBC  Recent Labs Lab 11/19/15 1054 11/19/15 1204 11/20/15 0350 11/21/15 0406  WBC 6.6 3.4* 8.5 6.6  HGB 8.5* 11.2* 7.5* 6.8*  HCT 25.2* 34.9* 22.5* 20.0*  PLT 174 139* 162 151  MCV 110.5* 87.7 109.8* 108.7*  MCH 37.3* 28.1 36.6* 37.0*  MCHC 33.7 32.1 33.3 34.0  RDW 20.4* 14.9 20.2* 20.1*  LYMPHSABS 1.9  --   --   --   MONOABS 1.1*  --   --   --   EOSABS 0.1  --   --   --   BASOSABS 0  --   --   --     Chemistries   Recent Labs Lab 11/19/15 1054 11/20/15 0350  NA 138 138  K 3.6 3.6  CL 104 106  CO2 24 23  GLUCOSE 161* 185*  BUN 9 12  CREATININE 0.88 0.80  CALCIUM 9.1 8.5*  AST 71* 65*  ALT 20 20  ALKPHOS 365* 360*  BILITOT 2.6* 2.6*   ------------------------------------------------------------------------------------------------------------------ estimated creatinine clearance is 55.1 mL/min (by C-G formula based on Cr of 0.8). ------------------------------------------------------------------------------------------------------------------ No results for input(s): HGBA1C in the last 72 hours. ------------------------------------------------------------------------------------------------------------------ No results for input(s): CHOL, HDL, LDLCALC, TRIG, CHOLHDL, LDLDIRECT in the last 72 hours. ------------------------------------------------------------------------------------------------------------------ No results for input(s): TSH, T4TOTAL, T3FREE, THYROIDAB in the last 72 hours.  Invalid input(s): FREET3 ------------------------------------------------------------------------------------------------------------------ No results for input(s): VITAMINB12, FOLATE, FERRITIN, TIBC, IRON, RETICCTPCT in the last 72 hours.  Coagulation profile No results for  input(s): INR, PROTIME in the last 168 hours.  No results for input(s): DDIMER in the last 72 hours.  Cardiac Enzymes No results for input(s): CKMB, TROPONINI, MYOGLOBIN in the last 168 hours.  Invalid input(s): CK ------------------------------------------------------------------------------------------------------------------ Invalid input(s): POCBNP     Time Spent in minutes   25 minutes   Bryer Gottsch [name] on 11/21/2015 at 2:27 PM  Between 7am to 7pm - Pager - [phone]  After 7pm go to [email or web address] - password [facility]  [facility]   Office  [phone]

## 2015-11-21 NOTE — Progress Notes (Signed)
PT Cancellation Note  Patient Details Name: Jamie [name] MRN: [id number] DOB: 1936-06-11   Cancelled Treatment:    Reason Eval/Treat Not Completed: Other (comment) (patient is eating, is confused. AND STATES THAT SHE LOOIKNG FOR HER HUSBAND. Not oriented to place.  MSW states plans for SNF. Will check back later.  [name] 11/21/2015, 9:34 AM Tresa Endo PT [phone]

## 2015-11-21 NOTE — Progress Notes (Signed)
CSW received referral for New SNF.   Full psychosocial assessment to follow.   CSW met at length with pt, pt husband, and pt daughter, Bryson Ha today. All are in agreement to plan for SNF from rehab and then facility assisting pt family with long term care plan from SNF. Bed offers provided and pt and pt family choose bed at [facility].   CSW confirmed with Ritta Slot that facility has availability for tomorrow if pt medically stable.  CSW has submitted pt pasarr and pasarr was sent to an Architect. CSW spoke with [facility] pasarr which stated that pasarr is automatically sent to evaluator because the hospice agency requested respite pasarr recently. CSW faxed in additional information to [place] [facility]. Awaiting pasarr number to be assigned.   CSW notified pt insurance, [employer] that pt family chose bed at [facility] will process SNF authorization.  CSW to continue to follow.  Alison Murray, MSW, [facility] [employer] [phone]

## 2015-11-21 NOTE — Progress Notes (Signed)
CRITICAL VALUE ALERT  Critical value received: HGB 6.8  Date of notification:  11/21/15  Time of notification:  0812  Critical value read back:YES  Nurse who received alert: Lottie Dawson  MD notified (1st page): DR [facility]  Time of first page:  0814  MD notified (2nd page):  Time of second page:  Responding MD: DR [facility]

## 2015-11-21 NOTE — Progress Notes (Signed)
Nutrition Brief Note Palliative Care was with patient during time of visit. Per PCP note, pt is already registered with community hospice.   Pt has Breast CA w/ bony and hepatic mets >> declined chemotherapy in the past.  It is inappropriate for RD to see patient at this time. Will see following Palliative visit if appropriate based on goals of care.  Jamie [name]. Ciin Brazzel, MS, RD LDN After Hours/Weekend Pager [phone]
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
## 2015-11-21 NOTE — Evaluation (Signed)
Physical Therapy Evaluation Patient Details Name: Jamie [name] MRN: [id number] DOB: 05-17-1936 Today's Date: 11/21/2015   History of Present Illness  a 80 y.o. female with a past medical history of breast cancer, radical mastectomy radiation to lumbar sacral spine and right hip in 2014.   She presents to the emergency department  11/19/15 with complaints of abdominal pain associated with distention.  A CT scan of abdomen and pelvis  revealed new large volume ascites as well as new small right liver lobe mass. Radiology also reported mild right hydronephrosis slightly increased from 11/16/2014. Radiology also noted extensive sclerotic osseous metastatic disease throughout skeleton.   Clinical Impression  Patient is pleasantly confused. Did ambulate a short distance, noted HGB 6.8. Patient  Will benefit from PT to address problems listed in the note below. Noted Palliative Care is involved.    Follow Up Recommendations SNF;Supervision/Assistance - 24 hour    Equipment Recommendations  None recommended by PT    Recommendations for Other Services       Precautions / Restrictions Precautions Precautions: Fall Precaution Comments: on O2, monitor      Mobility  Bed Mobility Overal bed mobility: Needs Assistance Bed Mobility: Supine to Sit     Supine to sit: Supervision     General bed mobility comments: cues for  activity to move forward with the activity  Transfers Overall transfer level: Needs assistance Equipment used: Rolling walker (2 wheeled) Transfers: Sit to/from Stand Sit to Stand: Min assist         General transfer comment: multimodal cues, easily distracted, cues for staying on task  Ambulation/Gait Ambulation/Gait assistance: Min assist Ambulation Distance (Feet): 100 Feet Assistive device: Rolling walker (2 wheeled) Gait Pattern/deviations: Step-through pattern     General Gait Details: noted to get  blood today,. sats 865 on 2 liters,  HR 122, sats returned to 955 with short rest back  Stairs            Wheelchair Mobility    Modified Rankin (Stroke Patients Only)       Balance Overall balance assessment: History of Falls;Needs assistance Sitting-balance support: No upper extremity supported;Feet supported Sitting balance-Leahy Scale: Fair     Standing balance support: During functional activity;Bilateral upper extremity supported Standing balance-Leahy Scale: Poor                               Pertinent Vitals/Pain Pain Assessment: No/denies pain    Home Living Family/patient expects to be discharged to:: Private residence   Available Help at Discharge: Family Type of Home: House Home Access: Level entry     Home Layout: One level Home Equipment: Environmental consultant - 2 wheels;Cane - single point      Prior Function Level of Independence: Independent with assistive device(s)         Comments: pt states she uses walker and cane when needed.      Hand Dominance        Extremity/Trunk Assessment   Upper Extremity Assessment: Generalized weakness           Lower Extremity Assessment: Generalized weakness      Cervical / Trunk Assessment: Normal  Communication      Cognition Arousal/Alertness: Awake/alert Behavior During Therapy: WFL for tasks assessed/performed Overall Cognitive Status: No family/caregiver present to determine baseline cognitive functioning Area of Impairment: Memory;Safety/judgement;Awareness   Current Attention Level: Divided Memory: Decreased short-term memory  General Comments      Exercises        Assessment/Plan    PT Assessment Patient needs continued PT services  PT Diagnosis Difficulty walking;Generalized weakness   PT Problem List Decreased strength;Decreased activity tolerance;Decreased balance;Decreased mobility;Cardiopulmonary status limiting activity;Decreased cognition;Decreased knowledge of use of DME;Decreased  safety awareness  PT Treatment Interventions DME instruction;Gait training;Functional mobility training;Therapeutic activities;Therapeutic exercise;Patient/family education   PT Goals (Current goals can be found in the Care Plan section) Acute Rehab PT Goals Patient Stated Goal: to find my husband PT Goal Formulation: With patient Time For Goal Achievement: 11/21/15 Potential to Achieve Goals: Fair    Frequency Min 3X/week   Barriers to discharge Decreased caregiver support      Co-evaluation               End of Session Equipment Utilized During Treatment: Gait belt Activity Tolerance: Patient limited by fatigue Patient left: in chair;with call bell/phone within reach;with chair alarm set Nurse Communication: Mobility status         Time: [id number] PT Time Calculation (min) (ACUTE ONLY): 18 min   Charges:   PT Evaluation $PT Eval Low Complexity: 1 Procedure     PT G CodesClaretha Cooper 11/21/2015, 12:31 PM Tresa Endo PT [phone]

## 2015-11-21 NOTE — Consult Note (Signed)
   [facility] CM Inpatient Consult   11/21/2015  LEVONIA KNOBLOCK 1936-02-23 [id number]  Received call from inpatient Licensed CSW for [place] [facility] referral for follow up at [facility]. Will follow up with patient/husband at bedside later on today to engage for [facility] services.   [name], MSN-Ed, RN,BSN [facility] Liaison [phone]
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
## 2015-11-22 LAB — HEMOGLOBIN AND HEMATOCRIT, BLOOD
HCT: 25.4 % — ABNORMAL LOW (ref 36.0–46.0)
Hemoglobin: 8.6 g/dL — ABNORMAL LOW (ref 12.0–15.0)

## 2015-11-22 LAB — CANCER ANTIGEN 27.29: CA 27.29: 1711.5 U/mL — AB (ref 0.0–38.6)

## 2015-11-22 LAB — GLUCOSE, CAPILLARY
GLUCOSE-CAPILLARY: 125 mg/dL — AB (ref 65–99)
Glucose-Capillary: 188 mg/dL — ABNORMAL HIGH (ref 65–99)
Glucose-Capillary: 212 mg/dL — ABNORMAL HIGH (ref 65–99)
Glucose-Capillary: 137 mg/dL — ABNORMAL HIGH (ref 65–99)

## 2015-11-22 NOTE — Progress Notes (Signed)
Patient Demographics  Jamie [name], is a 80 y.o. female, DOB - Jul 14, 1936, [id number]  Admit date - 11/19/2015   Admitting Physician Kelvin Cellar, MD  Outpatient Primary MD for the patient is [name], MD  LOS - 3   Chief Complaint  Patient presents with  . Abdominal Pain       Admission HPI/Brief narrative: 80 year old female with metastatic breast cancer to bones, status post chemotherapy and radiation, with abdominal pain, distention, workup significant for liver metastases, ascites, status post therapeutic paracentesis 11/20/2015 with 5 L drained.  Subjective:   Jamie [name] today has, No headache, No chest pain, complaints of abdominal pain- No Nausea,  No Cough - SOB.   Assessment & Plan    Principal Problem:   Breast cancer metastasized to bone [facility]) Active Problems:   Diabetes mellitus type 2, noninsulin dependent (HCC)   Ascites   Abdominal distention   Liver failure (HCC)   Metastatic breast cancer (HCC)   Liver mass   Palliative care encounter   Goals of care, counseling/discussion   malignant ascites - Patient with known history of metastatic breast cancer, appears to be involving the liver, resents with significant abdominal distention, imaging significant for ascites, patient had ultrasound-guided paracentesis with total of 5 L drained 11/20/2015.  Metastatic breast cancer - Discussed with Dr. Teena Dunk, patient is not a candidate for any further treatment, palliative care consulted, 's custody with patient and husband, plan is for symptom management, avoid unnecessary labs and workup. - Continue with when necessary pain medication.  Diabetes mellitus - Continue with insulin sliding scale  Elevated LFTs - Secondary to liver metastases, no further workup   Anemia - In the setting of chronic illness and malignancy, hemoglobin is 6.8 2/3, transfusion and its  PRBC, hemoglobin is 8.6 today.  Positive urinalysis - Patient denies any dysuria or polyuria, symptomatic, urine culture growing Escherichia coli, but is 50,000 colonies, no indication for treatment.  Failure to thrive /Protein calorie malnutrition -On ensure  Code Status: DO NOT RESUSCITATE  Family Communication:  None at bedside  Disposition Plan: Awaiting PASARR   For SNF discharge  Procedures  Ultrasound-guided paracentesis with 5 L drained 11/20/2015 1 unit PRBC 11/21/2015  Consults   Palliative medicine   Medications  Scheduled Meds: . feeding supplement (ENSURE ENLIVE)  237 mL Oral BID BM  . insulin aspart  0-15 Units Subcutaneous TID WC  . insulin aspart  0-5 Units Subcutaneous QHS  . sodium chloride flush  3 mL Intravenous Q12H   Continuous Infusions:  PRN Meds:.sodium chloride, acetaminophen, alum & mag hydroxide-simeth, bisacodyl, clonazePAM, metoCLOPramide, ondansetron **OR** ondansetron (ZOFRAN) IV, polyethylene glycol, polyvinyl alcohol, sodium chloride flush, traMADol  DVT Prophylaxis  SCDs  Lab Results  Component Value Date   PLT 151 11/21/2015    Antibiotics    Anti-infectives    None          Objective:   Filed Vitals:   11/21/15 1435 11/21/15 1533 11/21/15 2030 11/22/15 0459  BP: 145/55 160/50 159/63 152/52  Pulse: 100 98 105 97  Temp: 98.2 F (36.8 C) 98.7 F (37.1 C) 98.3 F (36.8 C) 98.7 F (37.1 C)  TempSrc: Oral Oral Oral Oral  Resp: 18 18 16 18   Height:  Weight:      SpO2:  98% 98% 99%    Wt Readings from Last 3 Encounters:  11/19/15 66.3 kg (146 lb 2.6 oz)  06/23/15 60.328 kg (133 lb)  06/18/15 60.374 kg (133 lb 1.6 oz)     Intake/Output Summary (Last 24 hours) at 11/22/15 1211 Last data filed at 11/21/15 1647  Gross per 24 hour  Intake    695 ml  Output      0 ml  Net    695 ml     Physical Exam  Awake Alert, Oriented  [place].AT,PERRAL Supple Neck,No JVD,  Symmetrical Chest wall movement, Good air movement  bilaterally, CTAB RRR,No Gallops,Rubs or new Murmurs, No Parasternal Heave +ve B.Sounds,abdomen significantly less distended after paracentesis,  No rebound - guarding or rigidity. No Cyanosis, Clubbing or edema, No new Rash or bruise    Data Review   Micro Results Recent Results (from the past 240 hour(s))  Urine culture     Status: None   Collection Time: 11/19/15  2:20 PM  Result Value Ref Range Status   Specimen Description URINE, CLEAN CATCH  Final   Special Requests NONE  Final   Culture 50,000 COLONIES/mL ESCHERICHIA COLI  Final   Report Status 11/21/2015 FINAL  Final   Organism ID, Bacteria ESCHERICHIA COLI  Final      Susceptibility   Escherichia coli - MIC*    AMPICILLIN <=2 SENSITIVE Sensitive     CEFAZOLIN <=4 SENSITIVE Sensitive     CEFTRIAXONE <=1 SENSITIVE Sensitive     CIPROFLOXACIN <=0.25 SENSITIVE Sensitive     GENTAMICIN <=1 SENSITIVE Sensitive     IMIPENEM <=0.25 SENSITIVE Sensitive     NITROFURANTOIN <=16 SENSITIVE Sensitive     TRIMETH/SULFA <=20 SENSITIVE Sensitive     AMPICILLIN/SULBACTAM <=2 SENSITIVE Sensitive     PIP/TAZO <=4 SENSITIVE Sensitive     * 50,000 COLONIES/mL ESCHERICHIA COLI  Anaerobic culture     Status: None (Preliminary result)   Collection Time: 11/20/15 12:21 PM  Result Value Ref Range Status   Specimen Description PERITONEAL  Final   Special Requests NONE  Final   Gram Stain   Final    MODERATE WBC PRESENT, PREDOMINANTLY MONONUCLEAR NO ORGANISMS SEEN Performed at [facility]    Culture   Final    NO ANAEROBES ISOLATED; CULTURE IN PROGRESS FOR 5 DAYS Performed at [facility]    Report Status PENDING  Incomplete  Body fluid culture     Status: None (Preliminary result)   Collection Time: 11/20/15 12:21 PM  Result Value Ref Range Status   Specimen Description PERITONEAL  Final   Special Requests NONE  Final   Gram Stain   Final    MODERATE WBC PRESENT, PREDOMINANTLY MONONUCLEAR NO ORGANISMS SEEN     Culture   Final    NO GROWTH 2 DAYS Performed at [facility]    Report Status PENDING  Incomplete    Radiology Reports Dg Chest 2 View  10/28/2015  CLINICAL DATA:  Nonproductive cough for 6-8 weeks EXAM: CHEST  2 VIEW COMPARISON:  CT chest 06/23/2015, chest x-ray 06/23/2015 FINDINGS: There is mild bilateral chronic interstitial thickening. There is no focal parenchymal opacity. There is no pleural effusion or pneumothorax. The heart and mediastinal contours are unremarkable. There is right rotator cuff calcific tendinosis. IMPRESSION: No active cardiopulmonary disease. Electronically Signed   By: Kathreen Devoid   On: 10/28/2015 13:28   Ct Head Wo Contrast  11/19/2015  CLINICAL DATA:  Weakness in legs since yesterday, cough for 1 year, confusion, history breast cancer, diabetes mellitus, hypertension EXAM: CT HEAD WITHOUT CONTRAST TECHNIQUE: Contiguous axial images were obtained from the base of the skull through the vertex without intravenous contrast. COMPARISON:  CT head 01/01/2014 FINDINGS: Generalized atrophy. Normal ventricular morphology. No midline shift or mass effect. Otherwise normal appearance of brain parenchyma. No intracranial hemorrhage, mass lesion or evidence acute infarction. No extra-axial fluid collections. Atherosclerotic calcifications at BILATERAL carotid siphons. Bones and sinuses unremarkable. IMPRESSION: No acute intracranial abnormalities. Electronically Signed   By: Lavonia Dana [name].   On: 11/19/2015 15:44   Ct Abdomen Pelvis W Contrast  11/19/2015  CLINICAL DATA:  Generalized abdominal pain. Bloating, nausea and vomiting. Diarrhea. Breast cancer with bone metastases. EXAM: CT ABDOMEN AND PELVIS WITH CONTRAST TECHNIQUE: Multidetector CT imaging of the abdomen and pelvis was performed using the standard protocol following bolus administration of intravenous contrast. CONTRAST:  61mL OMNIPAQUE IOHEXOL 300 MG/ML  SOLN COMPARISON:  11/16/2014 CT abdomen/ pelvis.  06/23/2015  chest CT. FINDINGS: Lower chest: Stable nodular scarring in the medial right middle lobe. Stable borderline cardiomegaly. Hepatobiliary: There is a new 0.9 cm hypodense liver lesion in the right lower lobe (series 2/ image 24). There is subtle diffuse heterogeneity of the liver parenchyma. No radiopaque gallstones. No definite gallbladder wall thickening. No biliary ductal dilatation. Pancreas: Normal, with no mass or duct dilation. Spleen: Normal size. No mass. Adrenals/Urinary Tract: Normal adrenals. Nonobstructing 3 mm stone in the upper right kidney. Mild right hydronephrosis with abrupt caliber transition at the right ureteropelvic junction, with normal caliber right ureter. No obstructing right-sided stones. The appearance of the right renal collecting system is similar to the 11/16/2014 CT study, with slightly increased right hydronephrosis. These findings suggest slightly worsening of a mild chronic right UPJ stenosis. No left hydronephrosis. No renal mass. Normal bladder. Stomach/Bowel: Grossly normal stomach. Normal caliber small bowel with no small bowel wall thickening. Status post appendectomy. Normal large bowel with no diverticulosis, large bowel wall thickening or pericolonic fat stranding. Vascular/Lymphatic: Atherosclerotic nonaneurysmal abdominal aorta. Patent portal, splenic, hepatic and renal veins. Mild paraumbilical varix. No pathologically enlarged lymph nodes in the abdomen or pelvis. Reproductive: Status post hysterectomy, with no abnormal findings at the vaginal cuff. No adnexal mass. Other: No pneumoperitoneum. Large volume ascites. No definite peritoneal thickening or nodularity. Musculoskeletal: Extensive patchy confluent sclerotic osseous metastases are seen throughout the lower thoracic, abdominal and pelvic skeleton, which appears increased in the left proximal femur, and is otherwise not appreciably changed. Marked degenerative changes in the visualized thoracolumbar spine. Mild  anasarca. IMPRESSION: 1. New large volume ascites. No definite peritoneal thickening or nodularity. Diagnostic paracentesis should be considered to exclude malignant ascites. 2. New small right liver lobe mass. Subtle heterogeneity of the liver parenchyma. Liver metastases not excluded. If clinically feasible, further evaluation on an outpatient basis with MRI abdomen with and without intravenous contrast is recommended, preferably immediately following paracentesis. 3. Mild right hydronephrosis, slightly increased since 11/16/2014, with findings suggestive of slight worsening of a mild chronic right UPJ stenosis. Nonobstructing 3 mm stone in the upper right kidney. 4. Extensive sclerotic osseous metastatic disease throughout the visualized skeleton, slightly worse in the left proximal femur, otherwise stable. Electronically Signed   By: Ilona Sorrel [name].   On: 11/19/2015 16:07   US Paracentesis  11/20/2015  INDICATION: H/o Breast cancer with new liver mass and abdominal ascites. Request was made for paracentesis EXAM: ULTRASOUND GUIDED right mid  quadrant PARACENTESIS MEDICATIONS: 1% lidocaine COMPLICATIONS: None immediate. PROCEDURE: Informed written consent was obtained from the patient's husband due to patient confusion and dementia, after a discussion of the risks, benefits and alternatives to treatment. A timeout was performed prior to the initiation of the procedure. Initial ultrasound scanning demonstrates a moderate amount of ascites within the right mid abdominal quadrant. The right mid abdomen was prepped and draped in the usual sterile fashion. 1% lidocaine was used for local anesthesia. Following this, a 7 cm Yueh catheter was introduced. An ultrasound image was saved for documentation purposes. The paracentesis was performed. The catheter was removed and a dressing was applied. The patient tolerated the procedure well without immediate post procedural complication. FINDINGS: A total of approximately 5  Liters of clear yellow fluid was removed. Samples were sent to the laboratory as requested by the clinical team. IMPRESSION: Successful ultrasound-guided paracentesis yielding 5 liters of peritoneal fluid. Read by: Saverio Danker, PA-C Electronically Signed   By: Jacqulynn Cadet [name].   On: 11/20/2015 12:29     CBC  Recent Labs Lab 11/19/15 1054 11/19/15 1204 11/20/15 0350 11/21/15 0406 11/22/15 0423  WBC 6.6 3.4* 8.5 6.6  --   HGB 8.5* 11.2* 7.5* 6.8* 8.6*  HCT 25.2* 34.9* 22.5* 20.0* 25.4*  PLT 174 139* 162 151  --   MCV 110.5* 87.7 109.8* 108.7*  --   MCH 37.3* 28.1 36.6* 37.0*  --   MCHC 33.7 32.1 33.3 34.0  --   RDW 20.4* 14.9 20.2* 20.1*  --   LYMPHSABS 1.9  --   --   --   --   MONOABS 1.1*  --   --   --   --   EOSABS 0.1  --   --   --   --   BASOSABS 0  --   --   --   --     Chemistries   Recent Labs Lab 11/19/15 1054 11/20/15 0350  NA 138 138  K 3.6 3.6  CL 104 106  CO2 24 23  GLUCOSE 161* 185*  BUN 9 12  CREATININE 0.88 0.80  CALCIUM 9.1 8.5*  AST 71* 65*  ALT 20 20  ALKPHOS 365* 360*  BILITOT 2.6* 2.6*   ------------------------------------------------------------------------------------------------------------------ estimated creatinine clearance is 55.1 mL/min (by C-G formula based on Cr of 0.8). ------------------------------------------------------------------------------------------------------------------ No results for input(s): HGBA1C in the last 72 hours. ------------------------------------------------------------------------------------------------------------------ No results for input(s): CHOL, HDL, LDLCALC, TRIG, CHOLHDL, LDLDIRECT in the last 72 hours. ------------------------------------------------------------------------------------------------------------------ No results for input(s): TSH, T4TOTAL, T3FREE, THYROIDAB in the last 72 hours.  Invalid input(s):  FREET3 ------------------------------------------------------------------------------------------------------------------ No results for input(s): VITAMINB12, FOLATE, FERRITIN, TIBC, IRON, RETICCTPCT in the last 72 hours.  Coagulation profile No results for input(s): INR, PROTIME in the last 168 hours.  No results for input(s): DDIMER in the last 72 hours.  Cardiac Enzymes No results for input(s): CKMB, TROPONINI, MYOGLOBIN in the last 168 hours.  Invalid input(s): CK ------------------------------------------------------------------------------------------------------------------ Invalid input(s): POCBNP     Time Spent in minutes   20 minutes   Gerldine Suleiman [name] on 11/22/2015 at 12:11 PM  Between 7am to 7pm - Pager - [phone]  After 7pm go to [email or web address] - password [facility]  [facility]   Office  [phone]

## 2015-11-22 NOTE — Progress Notes (Addendum)
CSW continuing to follow.   Pt plans to discharge to [facility].   Barrier to discharge at this time is that CSW is awaiting pasarr number to be assigned to pt. Pt was referred for Level II review given that pt recently had a respite SNF pasarr and per [place] [facility], pt who recently have respite pasarr and then 30 day pasarr is requested automatically get referred for review. CSW faxed additional documents to [place] Must on 11/21/15. CSW to continue to check status of pasarr in order to facilitate pt discharge once pasarr received.  CSW updated pt husband, Jamie [name] via telephone. Pt husband expressed understanding and was understanding of pasarr process from pt previous respite stay.   CSW updated Jamie [name] who confirms that facility can accept when pasarr received.   CSW left message with [employer] ([place]) weekend on call to update that awaiting pasarr to be assigned before pt can d/c to [place] to continue to follow.   Addendum 2:55 pm:  CSW received notification from [facility] that pt SNF stay has been approved, auth #: [id number] and authorization is valid through Monday 2/6.   CSW checked on status of pt pasarr and pt pasarr is still pending at this time.  CSW to continue to follow.  Alison Murray, MSW, LCSW Clinical Social Work Weekend coverage [phone]

## 2015-11-22 NOTE — Clinical Social Work Note (Addendum)
Clinical Social Work Assessment-Late Entry  Patient Details  Name: Jamie [name] MRN: [id number] Date of Birth: 1936-07-04  Date of referral:  11/22/15               Reason for consult:  Discharge Planning                Permission sought to share information with:  Family Supports Permission granted to share information::  Yes, Verbal Permission Granted  Name::     Jamie [name]  Agency::     Relationship::  husband  Contact Information:  [phone]  Housing/Transportation Living arrangements for the past 2 months:  Single Family Home Source of Information:  Patient, Spouse, Adult Children Patient Interpreter Needed:  None Criminal Activity/Legal Involvement Pertinent to Current Situation/Hospitalization:  No - Comment as needed Significant Relationships:  Spouse Lives with:  Spouse Do you feel safe going back to the place where you live?  No Need for family participation in patient care:  Yes (Comment)  Care giving concerns:  Pt admitted from home with pt husband. Pt husband has medical concerns of his own and unable to continue to care for pt at home. PT recommending SNF.    Social Worker assessment / plan:  CSW received referral for New SNF.   CSW met with pt, pt husband, and pt daughter at bedside. CSW introduced self and explained role. CSW provided supportive listening as pt husband discussed that he is having medical concerns of his own and pt care at home has become difficult. Pt daughter shared that pt family have looked into ALF placement in the past, but never moved forward with pt moving to ALF. Pt husband discussed that he has been aware of need to apply for Medicaid, but had not yet done so for pt. Pt family expressed hopefulness that pt can go to rehab facility and then transition into a ALF for long term care. CSW discussed process of SNF search and provided information on applying for Medicaid and strongly encouraged pt family to start process to get Medicaid  application submitted as soon as possible as pt has [employer] and this insurance will only cover rehab for only a certain amount of time. Pt family expressed understanding.   CSW had initiated SNF search to [facility] and provided pt family with SNF bed offers. Pt family chooses [facility] and CSW confirmed bed.  CSW submitted pt pasarr and pt pasarr was referred for Level II screening. CSW inquired with [place] Must and [place] Must reports that since pt recently had a respite pasarr for respite stay at SNF then pt pasarr automatically gets referred for review since CSW is submitting for  30 day pasarr. [place] Must recommended having 30 day note signed and fax documents to [place] [facility] to assist with process. CSW had MD sign 30 day note and faxed documents to [place] Must for review. Pasarr remains pending at this time.  CSW notified [employer] ([place]) that pt has chosen bed at [place] in order to [employer] auth when pt discharged.  CSW to continue to follow to provide support and assist with pt transition to [facility] when medically ready and pasarr received.    Employment status:  Retired Nurse, adult PT Recommendations:  [place] / Referral to community resources:  [place]  Patient/Family's Response to care:  Pt alert and oriented with periods of confusion. Pt husband recognizes that pt cannot continued to be cared for at home  given her needs and his current medical concerns. Pt and pt family are agreeable to plan for [facility] with palliative care services and facility will assist pt and pt family with long term care planning.   Patient/Family's Understanding of and Emotional Response to Diagnosis, Current Treatment, and Prognosis:  Pt family displayed their understanding of pt diagnosis and treatment plan. Pt had been on hospice at home, but hospice is now discontinued as pt in the hospital and will go to SNF upon  discharge.  Emotional Assessment Appearance:  Appears stated age Attitude/Demeanor/Rapport:  Other (pt appropriate) Affect (typically observed):  Appropriate Orientation:  Oriented to Self Alcohol / Substance use:  Not Applicable Psych involvement (Current and /or in the community):  No (Comment)  Discharge Needs  Concerns to be addressed:  Discharge Planning Concerns Readmission within the last 30 days:  No Current discharge risk:  None Barriers to Discharge:  Awaiting State Approval (Pasarr)   Kerby Hockley, [place], LCSW

## 2015-11-22 NOTE — Progress Notes (Signed)
Chaplain visit the result of a Spiritual Consult for a possible HCPOA. Jamie [name] misses being home and misses her little dog. She seemed confuses and frightened that her husband does not want her back home, and her daughter is supportive of that. She reports that her husband will not come and see her in the hospital. After hearing her stories, I concluded that assisting her with a HCPOA naming her husband and daughter - in that order - to be her decision makers was unwise at this time. She seems frightened to be in the hospital and speaks of staff being jealous of her having a room. Knowing that Social Workers are working to find placement for her, may account for her feeling of not being wanted at home or in this hospital. Jamie Bovey was pleased with the prayer prayed for her.  Page chaplains if Jamie Iiams needs or requests spiritual care.  Sallee Lange. Debbra Digiulio, DMin, MDiv, MA Chaplain

## 2015-11-23 LAB — TYPE AND SCREEN
ABO/RH(D): O NEG
ANTIBODY SCREEN: NEGATIVE
UNIT DIVISION: 0

## 2015-11-23 LAB — GLUCOSE, CAPILLARY
GLUCOSE-CAPILLARY: 116 mg/dL — AB (ref 65–99)
GLUCOSE-CAPILLARY: 186 mg/dL — AB (ref 65–99)
GLUCOSE-CAPILLARY: 169 mg/dL — AB (ref 65–99)
Glucose-Capillary: 162 mg/dL — ABNORMAL HIGH (ref 65–99)

## 2015-11-23 LAB — BODY FLUID CULTURE: CULTURE: NO GROWTH

## 2015-11-23 MED ORDER — LEVALBUTEROL HCL 1.25 MG/0.5ML IN NEBU
1.2500 mg | INHALATION_SOLUTION | Freq: Four times a day (QID) | RESPIRATORY_TRACT | Status: DC | PRN
  Filled 2015-11-23: qty 0.5

## 2015-11-23 MED ORDER — BENZONATATE 100 MG PO CAPS
200.0000 mg | ORAL_CAPSULE | Freq: Three times a day (TID) | ORAL | Status: DC | PRN
  Administered 2015-11-23: 200 mg via ORAL
  Filled 2015-11-23: qty 2

## 2015-11-23 MED ORDER — CHLORHEXIDINE GLUCONATE 0.12 % MT SOLN
15.0000 mL | Freq: Two times a day (BID) | OROMUCOSAL | Status: DC
  Administered 2015-11-24 – 2015-11-25 (×3): 15 mL via OROMUCOSAL
  Filled 2015-11-23 (×2): qty 15

## 2015-11-23 MED ORDER — CETYLPYRIDINIUM CHLORIDE 0.05 % MT LIQD
7.0000 mL | Freq: Two times a day (BID) | OROMUCOSAL | Status: DC
  Administered 2015-11-23 – 2015-11-25 (×5): 7 mL via OROMUCOSAL

## 2015-11-23 MED ORDER — LIP MEDEX EX OINT
TOPICAL_OINTMENT | CUTANEOUS | Status: AC
Start: 2015-11-23 — End: 2015-11-23
  Administered 2015-11-23: 13:00:00
  Filled 2015-11-23: qty 7

## 2015-11-23 NOTE — Progress Notes (Signed)
Chaplain Follow-up. Jamie [name] was anxious this morning awaiting news of when she will be transferred to [facility]. She is consistent in her reports that her husband is behind this transfer to spite her. He states that he is a mean man, who does not wish for her to return home. Today she was clear in regard to the place she is going being a nursing home. Yesterday it was a small apartment with little room for her belongs. Conversations about her childhood, her parents working at the [employer], and her childhood friends. Jamie [name] is pleasant to talk to, is appreciative of chaplain visits, saying that she enjoys having someone safe to talk to.  Page the chaplain at any time should Jamie [name] request or need chaplain care  Sallee Lange. Granville Whitefield, DMin, MDiv, MA Chaplain

## 2015-11-23 NOTE — Progress Notes (Deleted)
Spiritual Care Follow-up. Ms Jamie [name] did not wish to talk this morning. She reports that she has been in great pain and she had taken medication that made her very sleepy. She indicates that her daughter had visited but she did not feel ready to have an EOL/QOL discussion with her.  Recommend chaplain be paged when Ms Jamie [name] is ready to talk and/or needs spiritual/emotional care.  Sallee Lange. Jourdan Maldonado, DMin, MDiv, MA Chaplain

## 2015-11-23 NOTE — Progress Notes (Signed)
Patient Demographics  Jamie [name], is a 80 y.o. female, DOB - 1936-01-13, [id number]  Admit date - 11/19/2015   Admitting Physician Kelvin Cellar, MD  Outpatient Primary MD for the patient is [name], MD  LOS - 4   Chief Complaint  Patient presents with  . Abdominal Pain       Admission HPI/Brief narrative: 80 year old female with metastatic breast cancer to bones, status post chemotherapy and radiation, with abdominal pain, distention, workup significant for liver metastases, ascites, status post therapeutic paracentesis 11/20/2015 with 5 L drained.  Subjective:   Coua Caudill today has, No headache, No chest pain, complaints of abdominal pain- No Nausea,  No Cough - SOB.   Assessment & Plan    Principal Problem:   Breast cancer metastasized to bone [facility]) Active Problems:   Diabetes mellitus type 2, noninsulin dependent (HCC)   Ascites   Abdominal distention   Liver failure (HCC)   Metastatic breast cancer (HCC)   Liver mass   Palliative care encounter   Goals of care, counseling/discussion   malignant ascites - Patient with known history of metastatic breast cancer, appears to be involving the liver, resents with significant abdominal distention, imaging significant for ascites, patient had ultrasound-guided paracentesis with total of 5 L drained 11/20/2015.  Metastatic breast cancer - Discussed with Dr. Teena Dunk, patient is not a candidate for any further treatment, palliative care consulted, 's custody with patient and husband, plan is for symptom management, avoid unnecessary labs and workup. - Continue with when necessary pain medication.  Diabetes mellitus - Continue with insulin sliding scale  Elevated LFTs - Secondary to liver metastases, no further workup   Anemia - In the setting of chronic illness and malignancy, hemoglobin is 6.8 2/3, transfusion and its  PRBC, hemoglobin is 8.6 today.  Positive urinalysis - Patient denies any dysuria or polyuria, symptomatic, urine culture growing Escherichia coli, but is 50,000 colonies, no indication for treatment.  Failure to thrive /Protein calorie malnutrition -On ensure  Code Status: DO NOT RESUSCITATE  Family Communication:  None at bedside  Disposition Plan: Awaiting [facility]   For SNF discharge, not able to obtain during this weekend, hopefully tomorrow.  Procedures  Ultrasound-guided paracentesis with 5 L drained 11/20/2015 1 unit PRBC 11/21/2015  Consults   Palliative medicine   Medications  Scheduled Meds: . antiseptic oral rinse  7 mL Mouth Rinse q12n4p  . chlorhexidine  15 mL Mouth Rinse BID  . feeding supplement (ENSURE ENLIVE)  237 mL Oral BID BM  . insulin aspart  0-15 Units Subcutaneous TID WC  . insulin aspart  0-5 Units Subcutaneous QHS  . sodium chloride flush  3 mL Intravenous Q12H   Continuous Infusions:  PRN Meds:.sodium chloride, acetaminophen, alum & mag hydroxide-simeth, bisacodyl, clonazePAM, metoCLOPramide, ondansetron **OR** ondansetron (ZOFRAN) IV, polyethylene glycol, polyvinyl alcohol, sodium chloride flush, traMADol  DVT Prophylaxis  SCDs  Lab Results  Component Value Date   PLT 151 11/21/2015    Antibiotics    Anti-infectives    None          Objective:   Filed Vitals:   11/22/15 1402 11/22/15 2047 11/22/15 2116 11/23/15 0509  BP: 128/43 148/47 150/52 133/44  Pulse: 94 96 100 90  Temp: 98.8  F (37.1 C) 98.6 F (37 C) 98.8 F (37.1 C) 98.8 F (37.1 C)  TempSrc: Oral Oral Oral Oral  Resp: 18 16 18 16   Height:      Weight:      SpO2: 100% 97% 100% 99%    Wt Readings from Last 3 Encounters:  11/19/15 66.3 kg (146 lb 2.6 oz)  06/23/15 60.328 kg (133 lb)  06/18/15 60.374 kg (133 lb 1.6 oz)     Intake/Output Summary (Last 24 hours) at 11/23/15 1214 Last data filed at 11/22/15 1300  Gross per 24 hour  Intake    240 ml  Output    300  ml  Net    -60 ml     Physical Exam  Awake Alert,  [place].AT,PERRAL Supple Neck,No JVD,  Symmetrical Chest wall movement, Good air movement bilaterally, CTAB RRR,No Gallops,Rubs or new Murmurs, No Parasternal Heave +ve B.Sounds,abdomen significantly less distended after paracentesis,  No rebound - guarding or rigidity. No Cyanosis, Clubbing or edema, No new Rash or bruise    Data Review   Micro Results Recent Results (from the past 240 hour(s))  Urine culture     Status: None   Collection Time: 11/19/15  2:20 PM  Result Value Ref Range Status   Specimen Description URINE, CLEAN CATCH  Final   Special Requests NONE  Final   Culture 50,000 COLONIES/mL ESCHERICHIA COLI  Final   Report Status 11/21/2015 FINAL  Final   Organism ID, Bacteria ESCHERICHIA COLI  Final      Susceptibility   Escherichia coli - MIC*    AMPICILLIN <=2 SENSITIVE Sensitive     CEFAZOLIN <=4 SENSITIVE Sensitive     CEFTRIAXONE <=1 SENSITIVE Sensitive     CIPROFLOXACIN <=0.25 SENSITIVE Sensitive     GENTAMICIN <=1 SENSITIVE Sensitive     IMIPENEM <=0.25 SENSITIVE Sensitive     NITROFURANTOIN <=16 SENSITIVE Sensitive     TRIMETH/SULFA <=20 SENSITIVE Sensitive     AMPICILLIN/SULBACTAM <=2 SENSITIVE Sensitive     PIP/TAZO <=4 SENSITIVE Sensitive     * 50,000 COLONIES/mL ESCHERICHIA COLI  Anaerobic culture     Status: None (Preliminary result)   Collection Time: 11/20/15 12:21 PM  Result Value Ref Range Status   Specimen Description PERITONEAL  Final   Special Requests NONE  Final   Gram Stain   Final    MODERATE WBC PRESENT, PREDOMINANTLY MONONUCLEAR NO ORGANISMS SEEN Performed at [facility]    Culture   Final    NO ANAEROBES ISOLATED; CULTURE IN PROGRESS FOR 5 DAYS Performed at [facility]    Report Status PENDING  Incomplete  Body fluid culture     Status: None   Collection Time: 11/20/15 12:21 PM  Result Value Ref Range Status   Specimen Description PERITONEAL  Final   Special  Requests NONE  Final   Gram Stain   Final    MODERATE WBC PRESENT, PREDOMINANTLY MONONUCLEAR NO ORGANISMS SEEN    Culture   Final    NO GROWTH 3 DAYS Performed at [facility]    Report Status 11/23/2015 FINAL  Final    Radiology Reports Dg Chest 2 View  10/28/2015  CLINICAL DATA:  Nonproductive cough for 6-8 weeks EXAM: CHEST  2 VIEW COMPARISON:  CT chest 06/23/2015, chest x-ray 06/23/2015 FINDINGS: There is mild bilateral chronic interstitial thickening. There is no focal parenchymal opacity. There is no pleural effusion or pneumothorax. The heart and mediastinal contours are unremarkable. There is right rotator  cuff calcific tendinosis. IMPRESSION: No active cardiopulmonary disease. Electronically Signed   By: Kathreen Devoid   On: 10/28/2015 13:28   Ct Head Wo Contrast  11/19/2015  CLINICAL DATA:  Weakness in legs since yesterday, cough for 1 year, confusion, history breast cancer, diabetes mellitus, hypertension EXAM: CT HEAD WITHOUT CONTRAST TECHNIQUE: Contiguous axial images were obtained from the base of the skull through the vertex without intravenous contrast. COMPARISON:  CT head 01/01/2014 FINDINGS: Generalized atrophy. Normal ventricular morphology. No midline shift or mass effect. Otherwise normal appearance of brain parenchyma. No intracranial hemorrhage, mass lesion or evidence acute infarction. No extra-axial fluid collections. Atherosclerotic calcifications at BILATERAL carotid siphons. Bones and sinuses unremarkable. IMPRESSION: No acute intracranial abnormalities. Electronically Signed   By: Lavonia Dana [name].   On: 11/19/2015 15:44   Ct Abdomen Pelvis W Contrast  11/19/2015  CLINICAL DATA:  Generalized abdominal pain. Bloating, nausea and vomiting. Diarrhea. Breast cancer with bone metastases. EXAM: CT ABDOMEN AND PELVIS WITH CONTRAST TECHNIQUE: Multidetector CT imaging of the abdomen and pelvis was performed using the standard protocol following bolus administration of  intravenous contrast. CONTRAST:  28mL OMNIPAQUE IOHEXOL 300 MG/ML  SOLN COMPARISON:  11/16/2014 CT abdomen/ pelvis.  06/23/2015 chest CT. FINDINGS: Lower chest: Stable nodular scarring in the medial right middle lobe. Stable borderline cardiomegaly. Hepatobiliary: There is a new 0.9 cm hypodense liver lesion in the right lower lobe (series 2/ image 24). There is subtle diffuse heterogeneity of the liver parenchyma. No radiopaque gallstones. No definite gallbladder wall thickening. No biliary ductal dilatation. Pancreas: Normal, with no mass or duct dilation. Spleen: Normal size. No mass. Adrenals/Urinary Tract: Normal adrenals. Nonobstructing 3 mm stone in the upper right kidney. Mild right hydronephrosis with abrupt caliber transition at the right ureteropelvic junction, with normal caliber right ureter. No obstructing right-sided stones. The appearance of the right renal collecting system is similar to the 11/16/2014 CT study, with slightly increased right hydronephrosis. These findings suggest slightly worsening of a mild chronic right UPJ stenosis. No left hydronephrosis. No renal mass. Normal bladder. Stomach/Bowel: Grossly normal stomach. Normal caliber small bowel with no small bowel wall thickening. Status post appendectomy. Normal large bowel with no diverticulosis, large bowel wall thickening or pericolonic fat stranding. Vascular/Lymphatic: Atherosclerotic nonaneurysmal abdominal aorta. Patent portal, splenic, hepatic and renal veins. Mild paraumbilical varix. No pathologically enlarged lymph nodes in the abdomen or pelvis. Reproductive: Status post hysterectomy, with no abnormal findings at the vaginal cuff. No adnexal mass. Other: No pneumoperitoneum. Large volume ascites. No definite peritoneal thickening or nodularity. Musculoskeletal: Extensive patchy confluent sclerotic osseous metastases are seen throughout the lower thoracic, abdominal and pelvic skeleton, which appears increased in the left  proximal femur, and is otherwise not appreciably changed. Marked degenerative changes in the visualized thoracolumbar spine. Mild anasarca. IMPRESSION: 1. New large volume ascites. No definite peritoneal thickening or nodularity. Diagnostic paracentesis should be considered to exclude malignant ascites. 2. New small right liver lobe mass. Subtle heterogeneity of the liver parenchyma. Liver metastases not excluded. If clinically feasible, further evaluation on an outpatient basis with MRI abdomen with and without intravenous contrast is recommended, preferably immediately following paracentesis. 3. Mild right hydronephrosis, slightly increased since 11/16/2014, with findings suggestive of slight worsening of a mild chronic right UPJ stenosis. Nonobstructing 3 mm stone in the upper right kidney. 4. Extensive sclerotic osseous metastatic disease throughout the visualized skeleton, slightly worse in the left proximal femur, otherwise stable. Electronically Signed   By: Ilona Sorrel [name].   On:  11/19/2015 16:07   US Paracentesis  11/20/2015  INDICATION: H/o Breast cancer with new liver mass and abdominal ascites. Request was made for paracentesis EXAM: ULTRASOUND GUIDED right mid quadrant PARACENTESIS MEDICATIONS: 1% lidocaine COMPLICATIONS: None immediate. PROCEDURE: Informed written consent was obtained from the patient's husband due to patient confusion and dementia, after a discussion of the risks, benefits and alternatives to treatment. A timeout was performed prior to the initiation of the procedure. Initial ultrasound scanning demonstrates a moderate amount of ascites within the right mid abdominal quadrant. The right mid abdomen was prepped and draped in the usual sterile fashion. 1% lidocaine was used for local anesthesia. Following this, a 7 cm Yueh catheter was introduced. An ultrasound image was saved for documentation purposes. The paracentesis was performed. The catheter was removed and a dressing was  applied. The patient tolerated the procedure well without immediate post procedural complication. FINDINGS: A total of approximately 5 Liters of clear yellow fluid was removed. Samples were sent to the laboratory as requested by the clinical team. IMPRESSION: Successful ultrasound-guided paracentesis yielding 5 liters of peritoneal fluid. Read by: Saverio Danker, PA-C Electronically Signed   By: Jacqulynn Cadet [name].   On: 11/20/2015 12:29     CBC  Recent Labs Lab 11/19/15 1054 11/19/15 1204 11/20/15 0350 11/21/15 0406 11/22/15 0423  WBC 6.6 3.4* 8.5 6.6  --   HGB 8.5* 11.2* 7.5* 6.8* 8.6*  HCT 25.2* 34.9* 22.5* 20.0* 25.4*  PLT 174 139* 162 151  --   MCV 110.5* 87.7 109.8* 108.7*  --   MCH 37.3* 28.1 36.6* 37.0*  --   MCHC 33.7 32.1 33.3 34.0  --   RDW 20.4* 14.9 20.2* 20.1*  --   LYMPHSABS 1.9  --   --   --   --   MONOABS 1.1*  --   --   --   --   EOSABS 0.1  --   --   --   --   BASOSABS 0  --   --   --   --     Chemistries   Recent Labs Lab 11/19/15 1054 11/20/15 0350  NA 138 138  K 3.6 3.6  CL 104 106  CO2 24 23  GLUCOSE 161* 185*  BUN 9 12  CREATININE 0.88 0.80  CALCIUM 9.1 8.5*  AST 71* 65*  ALT 20 20  ALKPHOS 365* 360*  BILITOT 2.6* 2.6*   ------------------------------------------------------------------------------------------------------------------ estimated creatinine clearance is 55.1 mL/min (by C-G formula based on Cr of 0.8). ------------------------------------------------------------------------------------------------------------------ No results for input(s): HGBA1C in the last 72 hours. ------------------------------------------------------------------------------------------------------------------ No results for input(s): CHOL, HDL, LDLCALC, TRIG, CHOLHDL, LDLDIRECT in the last 72 hours. ------------------------------------------------------------------------------------------------------------------ No results for input(s): TSH, T4TOTAL,  T3FREE, THYROIDAB in the last 72 hours.  Invalid input(s): FREET3 ------------------------------------------------------------------------------------------------------------------ No results for input(s): VITAMINB12, FOLATE, FERRITIN, TIBC, IRON, RETICCTPCT in the last 72 hours.  Coagulation profile No results for input(s): INR, PROTIME in the last 168 hours.  No results for input(s): DDIMER in the last 72 hours.  Cardiac Enzymes No results for input(s): CKMB, TROPONINI, MYOGLOBIN in the last 168 hours.  Invalid input(s): CK ------------------------------------------------------------------------------------------------------------------ Invalid input(s): POCBNP     Time Spent in minutes  15 minutes   ELGERGAWY, DAWOOD [name] on 11/23/2015 at 12:14 PM  Between 7am to 7pm - Pager - [phone]  After 7pm go to [email or web address] - password [facility]  [facility]   Office  [phone]

## 2015-11-24 ENCOUNTER — Inpatient Hospital Stay (HOSPITAL_COMMUNITY): Payer: Medicare Other

## 2015-11-24 LAB — GLUCOSE, CAPILLARY
GLUCOSE-CAPILLARY: 170 mg/dL — AB (ref 65–99)
GLUCOSE-CAPILLARY: 175 mg/dL — AB (ref 65–99)
GLUCOSE-CAPILLARY: 132 mg/dL — AB (ref 65–99)
Glucose-Capillary: 143 mg/dL — ABNORMAL HIGH (ref 65–99)

## 2015-11-24 NOTE — Progress Notes (Signed)
PT Cancellation Note  Patient Details Name: Jamie [name] MRN: [id number] DOB: [birth year]   Cancelled Treatment:     pt out of room for Paracentesis.  Will check back another day.   Nathanial Rancher 11/24/2015, 3:35 PM

## 2015-11-24 NOTE — Progress Notes (Signed)
Pt requesting to see the chaplain in the AM, Rolly Salter. Paracentesis done today, 4L removed. Pt states feeling better. Husband in to visit, states he is having heart surgery in March. Possible discharge in AM.

## 2015-11-24 NOTE — Progress Notes (Signed)
Patient Demographics  Jamie [name], is a 80 y.o. female, DOB - [birth year], [id number]  Admit date - 11/19/2015   Admitting Physician Kelvin Cellar, MD  Outpatient Primary MD for the patient is [name], MD  LOS - 5   Chief Complaint  Patient presents with  . Abdominal Pain       Admission HPI/Brief narrative: 80 year old female with metastatic breast cancer to bones, status post chemotherapy and radiation, with abdominal pain, distention, workup significant for liver metastases, ascites, status post therapeutic paracentesis 11/20/2015 with 5 L drained.  Subjective:   Yerlin Govin today has, No headache, No chest pain, complaints of abdominal pain- No Nausea,  No Cough - SOB.   Assessment & Plan    Principal Problem:   Breast cancer metastasized to bone [facility]) Active Problems:   Diabetes mellitus type 2, noninsulin dependent (HCC)   Ascites   Abdominal distention   Liver failure (HCC)   Metastatic breast cancer (HCC)   Liver mass   Palliative care encounter   Goals of care, counseling/discussion   malignant ascites - Patient with known history of metastatic breast cancer, appears to be involving the liver, resents with significant abdominal distention, imaging significant for ascites, patient had ultrasound-guided paracentesis with total of 5 L drained 11/20/2015. We'll request IR to evaluate for possible repeat paracentesis if enough fluid to drain.  Metastatic breast cancer - Discussed with Dr. Ermalene Searing, patient is not a candidate for any further treatment, palliative care consulted, 's custody with patient and husband, plan is for symptom management, avoid unnecessary labs and workup. - Continue with when necessary pain medication.  Diabetes mellitus - Continue with insulin sliding scale  Elevated LFTs - Secondary to liver metastases, no further workup   Anemia - In the  setting of chronic illness and malignancy, hemoglobin is 6.8 2/3, transfusion and its PRBC, hemoglobin is 8.6 today.  Positive urinalysis - Patient denies any dysuria or polyuria, symptomatic, urine culture growing Escherichia coli, but is 50,000 colonies, no indication for treatment.  Failure to thrive /Protein calorie malnutrition -On ensure  Code Status: DO NOT RESUSCITATE  Family Communication:  None at bedside  Disposition Plan: Awaiting PASARR   For SNF discharge, cost with social worker, needs to be evaluated face-to-face to issue PASAR, should be done by tomorrow.  Procedures  Ultrasound-guided paracentesis with 5 L drained 11/20/2015 1 unit PRBC 11/21/2015  Consults   Palliative medicine   Medications  Scheduled Meds: . antiseptic oral rinse  7 mL Mouth Rinse q12n4p  . chlorhexidine  15 mL Mouth Rinse BID  . feeding supplement (ENSURE ENLIVE)  237 mL Oral BID BM  . insulin aspart  0-15 Units Subcutaneous TID WC  . insulin aspart  0-5 Units Subcutaneous QHS  . sodium chloride flush  3 mL Intravenous Q12H   Continuous Infusions:  PRN Meds:.sodium chloride, acetaminophen, alum & mag hydroxide-simeth, benzonatate, bisacodyl, clonazePAM, levalbuterol, metoCLOPramide, ondansetron **OR** ondansetron (ZOFRAN) IV, polyethylene glycol, polyvinyl alcohol, sodium chloride flush, traMADol  DVT Prophylaxis  SCDs  Lab Results  Component Value Date   PLT 151 11/21/2015    Antibiotics    Anti-infectives    None          Objective:   Filed Vitals:  11/23/15 1309 11/23/15 2027 11/24/15 0510 11/24/15 1401  BP: 151/45 132/55 131/50 148/46  Pulse: 99 84 99 93  Temp: 98.6 F (37 C) 98.6 F (37 C) 98.6 F (37 C) 97.9 F (36.6 C)  TempSrc: Oral Oral Oral Oral  Resp: 16 16 16 16   Height:      Weight:      SpO2: 98% 98% 99% 100%    Wt Readings from Last 3 Encounters:  11/19/15 66.3 kg (146 lb 2.6 oz)  06/23/15 60.328 kg (133 lb)  06/18/15 60.374 kg (133 lb 1.6 oz)      Intake/Output Summary (Last 24 hours) at 11/24/15 1424 Last data filed at 11/24/15 1000  Gross per 24 hour  Intake    682 ml  Output    225 ml  Net    457 ml     Physical Exam  Awake Alert,  [place].AT,PERRAL Supple Neck,No JVD,  Symmetrical Chest wall movement, Good air movement bilaterally, CTAB RRR,No Gallops,Rubs or new Murmurs, No Parasternal Heave +ve B.Sounds, ascites fluid start to build up ,  No rebound - guarding or rigidity. No Cyanosis, Clubbing or edema, No new Rash or bruise    Data Review   Micro Results Recent Results (from the past 240 hour(s))  Urine culture     Status: None   Collection Time: 11/19/15  2:20 PM  Result Value Ref Range Status   Specimen Description URINE, CLEAN CATCH  Final   Special Requests NONE  Final   Culture 50,000 COLONIES/mL ESCHERICHIA COLI  Final   Report Status 11/21/2015 FINAL  Final   Organism ID, Bacteria ESCHERICHIA COLI  Final      Susceptibility   Escherichia coli - MIC*    AMPICILLIN <=2 SENSITIVE Sensitive     CEFAZOLIN <=4 SENSITIVE Sensitive     CEFTRIAXONE <=1 SENSITIVE Sensitive     CIPROFLOXACIN <=0.25 SENSITIVE Sensitive     GENTAMICIN <=1 SENSITIVE Sensitive     IMIPENEM <=0.25 SENSITIVE Sensitive     NITROFURANTOIN <=16 SENSITIVE Sensitive     TRIMETH/SULFA <=20 SENSITIVE Sensitive     AMPICILLIN/SULBACTAM <=2 SENSITIVE Sensitive     PIP/TAZO <=4 SENSITIVE Sensitive     * 50,000 COLONIES/mL ESCHERICHIA COLI  Anaerobic culture     Status: None (Preliminary result)   Collection Time: 11/20/15 12:21 PM  Result Value Ref Range Status   Specimen Description PERITONEAL  Final   Special Requests NONE  Final   Gram Stain   Final    MODERATE WBC PRESENT, PREDOMINANTLY MONONUCLEAR NO ORGANISMS SEEN Performed at [facility]    Culture   Final    NO ANAEROBES ISOLATED; CULTURE IN PROGRESS FOR 5 DAYS Performed at [facility]    Report Status PENDING  Incomplete  Body fluid culture      Status: None   Collection Time: 11/20/15 12:21 PM  Result Value Ref Range Status   Specimen Description PERITONEAL  Final   Special Requests NONE  Final   Gram Stain   Final    MODERATE WBC PRESENT, PREDOMINANTLY MONONUCLEAR NO ORGANISMS SEEN    Culture   Final    NO GROWTH 3 DAYS Performed at [facility]    Report Status 11/23/2015 FINAL  Final    Radiology Reports Dg Chest 2 View  10/28/2015  CLINICAL DATA:  Nonproductive cough for 6-8 weeks EXAM: CHEST  2 VIEW COMPARISON:  CT chest 06/23/2015, chest x-ray 06/23/2015 FINDINGS: There is mild bilateral chronic interstitial  thickening. There is no focal parenchymal opacity. There is no pleural effusion or pneumothorax. The heart and mediastinal contours are unremarkable. There is right rotator cuff calcific tendinosis. IMPRESSION: No active cardiopulmonary disease. Electronically Signed   By: Kathreen Devoid   On: 10/28/2015 13:28   Ct Head Wo Contrast  11/19/2015  CLINICAL DATA:  Weakness in legs since yesterday, cough for 1 year, confusion, history breast cancer, diabetes mellitus, hypertension EXAM: CT HEAD WITHOUT CONTRAST TECHNIQUE: Contiguous axial images were obtained from the base of the skull through the vertex without intravenous contrast. COMPARISON:  CT head 01/01/2014 FINDINGS: Generalized atrophy. Normal ventricular morphology. No midline shift or mass effect. Otherwise normal appearance of brain parenchyma. No intracranial hemorrhage, mass lesion or evidence acute infarction. No extra-axial fluid collections. Atherosclerotic calcifications at BILATERAL carotid siphons. Bones and sinuses unremarkable. IMPRESSION: No acute intracranial abnormalities. Electronically Signed   By: Lavonia Dana [name].   On: 11/19/2015 15:44   Ct Abdomen Pelvis W Contrast  11/19/2015  CLINICAL DATA:  Generalized abdominal pain. Bloating, nausea and vomiting. Diarrhea. Breast cancer with bone metastases. EXAM: CT ABDOMEN AND PELVIS WITH CONTRAST  TECHNIQUE: Multidetector CT imaging of the abdomen and pelvis was performed using the standard protocol following bolus administration of intravenous contrast. CONTRAST:  23mL OMNIPAQUE IOHEXOL 300 MG/ML  SOLN COMPARISON:  11/16/2014 CT abdomen/ pelvis.  06/23/2015 chest CT. FINDINGS: Lower chest: Stable nodular scarring in the medial right middle lobe. Stable borderline cardiomegaly. Hepatobiliary: There is a new 0.9 cm hypodense liver lesion in the right lower lobe (series 2/ image 24). There is subtle diffuse heterogeneity of the liver parenchyma. No radiopaque gallstones. No definite gallbladder wall thickening. No biliary ductal dilatation. Pancreas: Normal, with no mass or duct dilation. Spleen: Normal size. No mass. Adrenals/Urinary Tract: Normal adrenals. Nonobstructing 3 mm stone in the upper right kidney. Mild right hydronephrosis with abrupt caliber transition at the right ureteropelvic junction, with normal caliber right ureter. No obstructing right-sided stones. The appearance of the right renal collecting system is similar to the 11/16/2014 CT study, with slightly increased right hydronephrosis. These findings suggest slightly worsening of a mild chronic right UPJ stenosis. No left hydronephrosis. No renal mass. Normal bladder. Stomach/Bowel: Grossly normal stomach. Normal caliber small bowel with no small bowel wall thickening. Status post appendectomy. Normal large bowel with no diverticulosis, large bowel wall thickening or pericolonic fat stranding. Vascular/Lymphatic: Atherosclerotic nonaneurysmal abdominal aorta. Patent portal, splenic, hepatic and renal veins. Mild paraumbilical varix. No pathologically enlarged lymph nodes in the abdomen or pelvis. Reproductive: Status post hysterectomy, with no abnormal findings at the vaginal cuff. No adnexal mass. Other: No pneumoperitoneum. Large volume ascites. No definite peritoneal thickening or nodularity. Musculoskeletal: Extensive patchy confluent  sclerotic osseous metastases are seen throughout the lower thoracic, abdominal and pelvic skeleton, which appears increased in the left proximal femur, and is otherwise not appreciably changed. Marked degenerative changes in the visualized thoracolumbar spine. Mild anasarca. IMPRESSION: 1. New large volume ascites. No definite peritoneal thickening or nodularity. Diagnostic paracentesis should be considered to exclude malignant ascites. 2. New small right liver lobe mass. Subtle heterogeneity of the liver parenchyma. Liver metastases not excluded. If clinically feasible, further evaluation on an outpatient basis with MRI abdomen with and without intravenous contrast is recommended, preferably immediately following paracentesis. 3. Mild right hydronephrosis, slightly increased since 11/16/2014, with findings suggestive of slight worsening of a mild chronic right UPJ stenosis. Nonobstructing 3 mm stone in the upper right kidney. 4. Extensive sclerotic osseous metastatic disease  throughout the visualized skeleton, slightly worse in the left proximal femur, otherwise stable. Electronically Signed   By: Ilona Sorrel [name].   On: 11/19/2015 16:07   US Paracentesis  11/20/2015  INDICATION: H/o Breast cancer with new liver mass and abdominal ascites. Request was made for paracentesis EXAM: ULTRASOUND GUIDED right mid quadrant PARACENTESIS MEDICATIONS: 1% lidocaine COMPLICATIONS: None immediate. PROCEDURE: Informed written consent was obtained from the patient's husband due to patient confusion and dementia, after a discussion of the risks, benefits and alternatives to treatment. A timeout was performed prior to the initiation of the procedure. Initial ultrasound scanning demonstrates a moderate amount of ascites within the right mid abdominal quadrant. The right mid abdomen was prepped and draped in the usual sterile fashion. 1% lidocaine was used for local anesthesia. Following this, a 7 cm Yueh catheter was introduced. An  ultrasound image was saved for documentation purposes. The paracentesis was performed. The catheter was removed and a dressing was applied. The patient tolerated the procedure well without immediate post procedural complication. FINDINGS: A total of approximately 5 Liters of clear yellow fluid was removed. Samples were sent to the laboratory as requested by the clinical team. IMPRESSION: Successful ultrasound-guided paracentesis yielding 5 liters of peritoneal fluid. Read by: Saverio Danker, PA-C Electronically Signed   By: Jacqulynn Cadet [name].   On: 11/20/2015 12:29     CBC  Recent Labs Lab 11/19/15 1054 11/19/15 1204 11/20/15 0350 11/21/15 0406 11/22/15 0423  WBC 6.6 3.4* 8.5 6.6  --   HGB 8.5* 11.2* 7.5* 6.8* 8.6*  HCT 25.2* 34.9* 22.5* 20.0* 25.4*  PLT 174 139* 162 151  --   MCV 110.5* 87.7 109.8* 108.7*  --   MCH 37.3* 28.1 36.6* 37.0*  --   MCHC 33.7 32.1 33.3 34.0  --   RDW 20.4* 14.9 20.2* 20.1*  --   LYMPHSABS 1.9  --   --   --   --   MONOABS 1.1*  --   --   --   --   EOSABS 0.1  --   --   --   --   BASOSABS 0  --   --   --   --     Chemistries   Recent Labs Lab 11/19/15 1054 11/20/15 0350  NA 138 138  K 3.6 3.6  CL 104 106  CO2 24 23  GLUCOSE 161* 185*  BUN 9 12  CREATININE 0.88 0.80  CALCIUM 9.1 8.5*  AST 71* 65*  ALT 20 20  ALKPHOS 365* 360*  BILITOT 2.6* 2.6*   ------------------------------------------------------------------------------------------------------------------ estimated creatinine clearance is 55.1 mL/min (by C-G formula based on Cr of 0.8). ------------------------------------------------------------------------------------------------------------------ No results for input(s): HGBA1C in the last 72 hours. ------------------------------------------------------------------------------------------------------------------ No results for input(s): CHOL, HDL, LDLCALC, TRIG, CHOLHDL, LDLDIRECT in the last 72  hours. ------------------------------------------------------------------------------------------------------------------ No results for input(s): TSH, T4TOTAL, T3FREE, THYROIDAB in the last 72 hours.  Invalid input(s): FREET3 ------------------------------------------------------------------------------------------------------------------ No results for input(s): VITAMINB12, FOLATE, FERRITIN, TIBC, IRON, RETICCTPCT in the last 72 hours.  Coagulation profile No results for input(s): INR, PROTIME in the last 168 hours.  No results for input(s): DDIMER in the last 72 hours.  Cardiac Enzymes No results for input(s): CKMB, TROPONINI, MYOGLOBIN in the last 168 hours.  Invalid input(s): CK ------------------------------------------------------------------------------------------------------------------ Invalid input(s): POCBNP     Time Spent in minutes  15 minutes   Gianni Fuchs [name] on 11/24/2015 at 2:24 PM  Between 7am to 7pm - Pager - [phone]  After 7pm  go to [email or web address] - password [facility]  [facility]   Office  [phone]

## 2015-11-24 NOTE — Care Management Important Message (Signed)
Important Message  Patient Details  Name: Jamie [name] MRN: [id number] Date of Birth: 03-Dec-1935   Medicare Important Message Given:  Yes    Camillo Flaming 11/24/2015, 9:13 AMImportant Message  Patient Details  Name: Jamie [name] MRN: [id number] Date of Birth: 1935/11/14   Medicare Important Message Given:  Yes    Camillo Flaming 11/24/2015, 9:13 AM

## 2015-11-24 NOTE — Procedures (Signed)
Ultrasound-guided  therapeutic paracentesis performed yielding 4 liters of yellow fluid. No immediate complications.  

## 2015-11-24 NOTE — Progress Notes (Signed)
CSW assisting with d/c planning. Pt has been referred for a level 2 PASRR which requires a face to face assessment. CSW expects this assessment to take place today / tomorrow. Pt cannot be d/c until PASRR screening is completed and PASRR # is provided.   Werner Lean LCSW [phone]
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
## 2015-11-24 NOTE — Plan of Care (Signed)
Problem: Health Behavior/Discharge Planning: Goal: Ability to manage health-related needs will improve Outcome: Progressing Plan for d/c to SNF.   

## 2015-11-25 LAB — GLUCOSE, CAPILLARY
GLUCOSE-CAPILLARY: 154 mg/dL — AB (ref 65–99)
Glucose-Capillary: 103 mg/dL — ABNORMAL HIGH (ref 65–99)

## 2015-11-25 LAB — ANAEROBIC CULTURE: GRAM STAIN: NONE SEEN

## 2015-11-25 MED ORDER — ONDANSETRON HCL 4 MG PO TABS: 4.0000 mg | ORAL_TABLET | Freq: Four times a day (QID) | ORAL | Status: AC | PRN

## 2015-11-25 MED ORDER — INSULIN ASPART 100 UNIT/ML ~~LOC~~ SOLN: 0.0000 [IU] | Freq: Three times a day (TID) | SUBCUTANEOUS | Status: AC

## 2015-11-25 MED ORDER — BISACODYL 5 MG PO TBEC: 5.0000 mg | DELAYED_RELEASE_TABLET | Freq: Every day | ORAL | Status: AC | PRN

## 2015-11-25 MED ORDER — LEVALBUTEROL HCL 1.25 MG/0.5ML IN NEBU: 1.2500 mg | INHALATION_SOLUTION | Freq: Four times a day (QID) | RESPIRATORY_TRACT | Status: AC | PRN

## 2015-11-25 MED ORDER — ENSURE ENLIVE PO LIQD: 237.0000 mL | Freq: Two times a day (BID) | ORAL | Status: AC

## 2015-11-25 MED ORDER — ALUM & MAG HYDROXIDE-SIMETH 200-200-20 MG/5ML PO SUSP: 30.0000 mL | Freq: Four times a day (QID) | ORAL | Status: AC | PRN

## 2015-11-25 MED ORDER — TRAMADOL HCL 50 MG PO TABS: 50.0000 mg | ORAL_TABLET | Freq: Four times a day (QID) | ORAL | Status: AC | PRN

## 2015-11-25 MED ORDER — CLONAZEPAM 0.5 MG PO TABS: 0.5000 mg | ORAL_TABLET | Freq: Two times a day (BID) | ORAL | Status: AC

## 2015-11-25 NOTE — Discharge Instructions (Signed)
Follow with Primary MD [name], MD in 7 days   Activity: As tolerated with Full fall precautions use walker/cane & assistance as needed   Disposition SNF   Diet: Regular diet , with feeding assistance and aspiration precautions.  For Heart failure patients - Check your Weight same time everyday, if you gain over 2 pounds, or you develop in leg swelling, experience more shortness of breath or chest pain, call your Primary MD immediately. Follow Cardiac Low Salt Diet and 1.5 lit/day fluid restriction.   On your next visit with your primary care physician please Get Medicines reviewed and adjusted.   Please request your Prim.MD to go over all Hospital Tests and Procedure/Radiological results at the follow up, please get all Hospital records sent to your Prim MD by signing hospital release before you go home.   If you experience worsening of your admission symptoms, develop shortness of breath, life threatening emergency, suicidal or homicidal thoughts you must seek medical attention immediately by calling 911 or calling your MD immediately  if symptoms less severe.  You Must read complete instructions/literature along with all the possible adverse reactions/side effects for all the Medicines you take and that have been prescribed to you. Take any new Medicines after you have completely understood and accpet all the possible adverse reactions/side effects.   Do not drive, operating heavy machinery, perform activities at heights, swimming or participation in water activities or provide baby sitting services if your were admitted for syncope or siezures until you have seen by Primary MD or a Neurologist and advised to do so again.  Do not drive when taking Pain medications.    Do not take more than prescribed Pain, Sleep and Anxiety Medications  Special Instructions: If you have smoked or chewed Tobacco  in the last 2 yrs please stop smoking, stop any regular Alcohol  and or any  Recreational drug use.  Wear Seat belts while driving.   Please note  You were cared for by a hospitalist during your hospital stay. If you have any questions about your discharge medications or the care you received while you were in the hospital after you are discharged, you can call the unit and asked to speak with the hospitalist on call if the hospitalist that took care of you is not available. Once you are discharged, your primary care physician will handle any further medical issues. Please note that NO REFILLS for any discharge medications will be authorized once you are discharged, as it is imperative that you return to your primary care physician (or establish a relationship with a primary care physician if you do not have one) for your aftercare needs so that they can reassess your need for medications and monitor your lab values.
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
## 2015-11-25 NOTE — Discharge Summary (Signed)
BASYA GRUENBERG, is a 80 y.o. female  DOB 09/21/1936  MRN [id number].  Admission date:  11/19/2015  Admitting Physician  Kelvin Cellar, MD  Discharge Date:  11/25/2015   Primary MD  [name], MD  Recommendations for primary care physician for things to follow:  - Patient to be seen by palliative medicine at facility in 1-2 days. - Patient will need therapeutic paracentesis on as needed basis as an outpatient.   Admission Diagnosis  Abdominal distention [R14.0] Generalized abdominal pain [R10.84] Liver mass [R16.0] Ascites [R18.8]   Discharge Diagnosis  Abdominal distention [R14.0] Generalized abdominal pain [R10.84] Liver mass [R16.0] Ascites [R18.8]    Principal Problem:   Breast cancer metastasized to bone [facility]) Active Problems:   Diabetes mellitus type 2, noninsulin dependent (HCC)   Ascites   Abdominal distention   Liver failure (HCC)   Metastatic breast cancer [facility])   Liver mass   Palliative care encounter   Goals of care, counseling/discussion      Past Medical History  Diagnosis Date  . Anxiety   . Asthma   . Breast cancer ([place])     metastasized  . Depression   . Diabetes mellitus   . GERD (gastroesophageal reflux disease)   . Hyperlipidemia   . Internal hemorrhoids   . IBS (irritable bowel syndrome)   . Mitral valve prolapse   . Ischemic colitis ([place])   . Fibromyalgia   . Allergy   . Cataract   . S/P radiation therapy within four to twelve weeks 04/26/13-05/11/13    L-spine/sacrum 30Gy/70fx  . Colon polyp 2013    TUBULAR ADENOMA  . Hypertension     Past Surgical History  Procedure Laterality Date  . Mastectomy Left     then treated with chemo and radiation  . Total abdominal hysterectomy    . Back surgery      synovial cyst removed from spine  . Carpal tunnel release Bilateral   . Dilation and curettage of uterus    . Appendectomy    . Cataract  extraction Bilateral   . Colonoscopy w/ biopsies  2013  . Esophagogastroduodenoscopy  2008    normal       History of present illness and  Hospital Course:     Kindly see H&P for history of present illness and admission details, please review complete Labs, Consult reports and Test reports for all details in brief  HPI  from the history and physical done on the day of admission 11/19/2015 FRANCHON SIRACUSA is a 80 y.o. female with a past medical history of breast cancer diagnosed in 2006 undergoing biopsy that revealed ER and PR positive invasive lobular carcinoma. She underwent left modified radical mastectomy on 11/02/2005, had positive sentinel lymph node and underwent left axillary lymph node dissection. She declined chemotherapy in the past. She underwent radiation therapy of lumbar sacral spine and right hip in 2014. She has been treated with anastrozole and exemestane Which she did not tolerate, followed by treatment with tamoxifen between 2007  and 2011. She was started on exemestane on 02/25/2015 and everolimus on 03/28/2015. Her last appointment with her primary oncologist was with Dr Jana Hakim on 05/04/2015. Her last bone scan was performed on 06/11/2015 that revealed stable to improved appearance of multifocal bone metastasis. She presents to the emergency department with complaints of abdominal pain associated with distention. Symptoms have been present for the last 2-3 weeks she states. She also reports bilateral extremity pitting edema with associated leg pain. She feels abdominal distention restricting her breathing. She denies fevers, chills, nausea, vomiting, hematemesis, bloody stools. A CT scan of abdomen and pelvis performed in the emergency department revealed new large volume ascites as well as new small right liver lobe mass. Radiology also reported mild right hydronephrosis slightly increased from 11/16/2014. Radiology also noted extensive sclerotic osseous metastatic disease  throughout skeleton.    Hospital Course  80 year old female with metastatic breast cancer to bones, status post chemotherapy and radiation, with abdominal pain, distention, workup significant for liver metastases, ascites, status post therapeutic paracentesis 11/20/2015 with 5 L drained, had another therapeutic paracentesis on 2/6 with 4 L drained.  malignant ascites - Patient with known history of metastatic breast cancer, appears to be involving the liver, resents with significant abdominal distention, imaging significant for ascites, patient had ultrasound-guided paracentesis with total of 5 L drained 11/20/2015. As well another paracentesis on 2/6 with 4 L drained , she will continue needing paracentesis on an as-needed basis for recurrent ascites .  Metastatic breast cancer - Discussed with Dr. Ermalene Searing, patient is not a candidate for any further treatment, palliative care consulted, 's custody with patient and husband, plan is for symptom management, avoid unnecessary labs and workup. - Continue with when necessary pain medication.  Diabetes mellitus - Continue with insulin sliding scale  Elevated LFTs - Secondary to liver metastases, no further workup   Anemia - In the setting of chronic illness and malignancy, hemoglobin is 6.8 2/3, transfuse 1 unit PRBC on 2/3, hemoglobin has been stable since.  Positive urinalysis - Patient denies any dysuria or polyuria, symptomatic, urine culture growing Escherichia coli, but is 50,000 colonies, no indication for treatment.  Failure to thrive /Protein calorie malnutrition -On ensure  Discharge Condition:  Stable   Follow UP   to be seen by palliative medicine at facility   Discharge Instructions  and  Discharge Medications     Discharge Instructions    Diet - low sodium heart healthy    Complete by:  As directed      Discharge instructions    Complete by:  As directed   Follow with Primary MD [name], MD in 7  days   Activity: As tolerated with Full fall precautions use walker/cane & assistance as needed   Disposition SNF   Diet: Regular diet , with feeding assistance and aspiration precautions.  For Heart failure patients - Check your Weight same time everyday, if you gain over 2 pounds, or you develop in leg swelling, experience more shortness of breath or chest pain, call your Primary MD immediately. Follow Cardiac Low Salt Diet and 1.5 lit/day fluid restriction.   On your next visit with your primary care physician please Get Medicines reviewed and adjusted.   Please request your Prim.MD to go over all Hospital Tests and Procedure/Radiological results at the follow up, please get all Hospital records sent to your Prim MD by signing hospital release before you go home.   If you experience worsening of your admission symptoms, develop shortness  of breath, life threatening emergency, suicidal or homicidal thoughts you must seek medical attention immediately by calling 911 or calling your MD immediately  if symptoms less severe.  You Must read complete instructions/literature along with all the possible adverse reactions/side effects for all the Medicines you take and that have been prescribed to you. Take any new Medicines after you have completely understood and accpet all the possible adverse reactions/side effects.   Do not drive, operating heavy machinery, perform activities at heights, swimming or participation in water activities or provide baby sitting services if your were admitted for syncope or siezures until you have seen by Primary MD or a Neurologist and advised to do so again.  Do not drive when taking Pain medications.    Do not take more than prescribed Pain, Sleep and Anxiety Medications  Special Instructions: If you have smoked or chewed Tobacco  in the last 2 yrs please stop smoking, stop any regular Alcohol  and or any Recreational drug use.  Wear Seat belts while  driving.   Please note  You were cared for by a hospitalist during your hospital stay. If you have any questions about your discharge medications or the care you received while you were in the hospital after you are discharged, you can call the unit and asked to speak with the hospitalist on call if the hospitalist that took care of you is not available. Once you are discharged, your primary care physician will handle any further medical issues. Please note that NO REFILLS for any discharge medications will be authorized once you are discharged, as it is imperative that you return to your primary care physician (or establish a relationship with a primary care physician if you do not have one) for your aftercare needs so that they can reassess your need for medications and monitor your lab values.     Increase activity slowly    Complete by:  As directed             Medication List    STOP taking these medications        exemestane 25 MG tablet  Commonly known as:  AROMASIN     PROAIR HFA 108 (90 Base) MCG/ACT inhaler  Generic drug:  albuterol      TAKE these medications        acetaminophen 500 MG tablet  Commonly known as:  TYLENOL  Take 500 mg by mouth every 6 (six) hours as needed for mild pain or moderate pain (pain).     alum & mag hydroxide-simeth 200-200-20 MG/5ML suspension  Commonly known as:  MAALOX/MYLANTA  Take 30 mLs by mouth every 6 (six) hours as needed for indigestion or heartburn (dyspepsia).     benzonatate 200 MG capsule  Commonly known as:  TESSALON  Take 200 mg by mouth 3 (three) times daily as needed for cough.     bisacodyl 5 MG EC tablet  Commonly known as:  DULCOLAX  Take 1 tablet (5 mg total) by mouth daily as needed for moderate constipation.     clonazePAM 0.5 MG tablet  Commonly known as:  KLONOPIN  Take 1 tablet (0.5 mg total) by mouth 2 (two) times daily.     esomeprazole 40 MG capsule  Commonly known as:  NEXIUM  Take 1 capsule (40 mg  total) by mouth daily.     everolimus 10 MG tablet  Commonly known as:  AFINITOR  Take 1 tablet (10 mg total) by mouth daily.  feeding supplement (ENSURE ENLIVE) Liqd  Take 237 mLs by mouth 2 (two) times daily between meals.     glycerin adult 2 g Supp  Generic drug:  glycerin adult  Place 1 suppository rectally daily as needed for mild constipation (constipation).     hydrocortisone 2.5 % rectal cream  Commonly known as:  ANUSOL-HC  Place 1 application rectally daily as needed for hemorrhoids or itching.     insulin aspart 100 UNIT/ML injection  Commonly known as:  novoLOG  Inject 0-15 Units into the skin 3 (three) times daily with meals.     levalbuterol 1.25 MG/0.5ML nebulizer solution  Commonly known as:  XOPENEX  Take 1.25 mg by nebulization every 6 (six) hours as needed for wheezing or shortness of breath.     metoCLOPramide 5 MG tablet  Commonly known as:  REGLAN  Take 5 mg by mouth 3 (three) times daily as needed for nausea.     ondansetron 4 MG tablet  Commonly known as:  ZOFRAN  Take 1 tablet (4 mg total) by mouth every 6 (six) hours as needed for nausea.     polyethylene glycol packet  Commonly known as:  MIRALAX / GLYCOLAX  Take 17 g by mouth daily as needed for mild constipation.     QVAR 80 MCG/ACT inhaler  Generic drug:  beclomethasone  Inhale 2 puffs into the lungs at bedtime.     SYSTANE OP  Apply 1-2 drops to eye daily as needed (dry eyes).     traMADol 50 MG tablet  Commonly known as:  ULTRAM  Take 1 tablet (50 mg total) by mouth every 6 (six) hours as needed for severe pain.     Vitamin D 1000 units capsule  Take 1,000 Units by mouth daily.          Diet and Activity recommendation: See Discharge Instructions above   Consults obtained -  none   Major procedures and Radiology Reports - PLEASE review detailed and final reports for all details, in brief -  Ultrasound-guided paracentesis with 5 L drained 11/20/2015, repeat paracentesis  with 4 L drained on 2/6 1 unit PRBC 11/21/2015   Dg Chest 2 View  10/28/2015  CLINICAL DATA:  Nonproductive cough for 6-8 weeks EXAM: CHEST  2 VIEW COMPARISON:  CT chest 06/23/2015, chest x-ray 06/23/2015 FINDINGS: There is mild bilateral chronic interstitial thickening. There is no focal parenchymal opacity. There is no pleural effusion or pneumothorax. The heart and mediastinal contours are unremarkable. There is right rotator cuff calcific tendinosis. IMPRESSION: No active cardiopulmonary disease. Electronically Signed   By: Kathreen Devoid   On: 10/28/2015 13:28   Ct Head Wo Contrast  11/19/2015  CLINICAL DATA:  Weakness in legs since yesterday, cough for 1 year, confusion, history breast cancer, diabetes mellitus, hypertension EXAM: CT HEAD WITHOUT CONTRAST TECHNIQUE: Contiguous axial images were obtained from the base of the skull through the vertex without intravenous contrast. COMPARISON:  CT head 01/01/2014 FINDINGS: Generalized atrophy. Normal ventricular morphology. No midline shift or mass effect. Otherwise normal appearance of brain parenchyma. No intracranial hemorrhage, mass lesion or evidence acute infarction. No extra-axial fluid collections. Atherosclerotic calcifications at BILATERAL carotid siphons. Bones and sinuses unremarkable. IMPRESSION: No acute intracranial abnormalities. Electronically Signed   By: Lavonia Dana [name].   On: 11/19/2015 15:44   Ct Abdomen Pelvis W Contrast  11/19/2015  CLINICAL DATA:  Generalized abdominal pain. Bloating, nausea and vomiting. Diarrhea. Breast cancer with bone metastases. EXAM: CT ABDOMEN AND PELVIS  WITH CONTRAST TECHNIQUE: Multidetector CT imaging of the abdomen and pelvis was performed using the standard protocol following bolus administration of intravenous contrast. CONTRAST:  29mL OMNIPAQUE IOHEXOL 300 MG/ML  SOLN COMPARISON:  11/16/2014 CT abdomen/ pelvis.  06/23/2015 chest CT. FINDINGS: Lower chest: Stable nodular scarring in the medial right middle  lobe. Stable borderline cardiomegaly. Hepatobiliary: There is a new 0.9 cm hypodense liver lesion in the right lower lobe (series 2/ image 24). There is subtle diffuse heterogeneity of the liver parenchyma. No radiopaque gallstones. No definite gallbladder wall thickening. No biliary ductal dilatation. Pancreas: Normal, with no mass or duct dilation. Spleen: Normal size. No mass. Adrenals/Urinary Tract: Normal adrenals. Nonobstructing 3 mm stone in the upper right kidney. Mild right hydronephrosis with abrupt caliber transition at the right ureteropelvic junction, with normal caliber right ureter. No obstructing right-sided stones. The appearance of the right renal collecting system is similar to the 11/16/2014 CT study, with slightly increased right hydronephrosis. These findings suggest slightly worsening of a mild chronic right UPJ stenosis. No left hydronephrosis. No renal mass. Normal bladder. Stomach/Bowel: Grossly normal stomach. Normal caliber small bowel with no small bowel wall thickening. Status post appendectomy. Normal large bowel with no diverticulosis, large bowel wall thickening or pericolonic fat stranding. Vascular/Lymphatic: Atherosclerotic nonaneurysmal abdominal aorta. Patent portal, splenic, hepatic and renal veins. Mild paraumbilical varix. No pathologically enlarged lymph nodes in the abdomen or pelvis. Reproductive: Status post hysterectomy, with no abnormal findings at the vaginal cuff. No adnexal mass. Other: No pneumoperitoneum. Large volume ascites. No definite peritoneal thickening or nodularity. Musculoskeletal: Extensive patchy confluent sclerotic osseous metastases are seen throughout the lower thoracic, abdominal and pelvic skeleton, which appears increased in the left proximal femur, and is otherwise not appreciably changed. Marked degenerative changes in the visualized thoracolumbar spine. Mild anasarca. IMPRESSION: 1. New large volume ascites. No definite peritoneal thickening or  nodularity. Diagnostic paracentesis should be considered to exclude malignant ascites. 2. New small right liver lobe mass. Subtle heterogeneity of the liver parenchyma. Liver metastases not excluded. If clinically feasible, further evaluation on an outpatient basis with MRI abdomen with and without intravenous contrast is recommended, preferably immediately following paracentesis. 3. Mild right hydronephrosis, slightly increased since 11/16/2014, with findings suggestive of slight worsening of a mild chronic right UPJ stenosis. Nonobstructing 3 mm stone in the upper right kidney. 4. Extensive sclerotic osseous metastatic disease throughout the visualized skeleton, slightly worse in the left proximal femur, otherwise stable. Electronically Signed   By: Ilona Sorrel [name].   On: 11/19/2015 16:07   US Paracentesis  11/24/2015  INDICATION: Metastatic breast carcinoma, recurrent ascites. Request is made for therapeutic paracentesis. EXAM: ULTRASOUND GUIDED THERAPEUTIC PARACENTESIS MEDICATIONS: None. COMPLICATIONS: None immediate. PROCEDURE: Informed written consent was obtained from the patient after a discussion of the risks, benefits and alternatives to treatment. A timeout was performed prior to the initiation of the procedure. Initial ultrasound scanning demonstrates a moderate amount of ascites within the left lower abdominal quadrant. The left lower abdomen was prepped and draped in the usual sterile fashion. 1% lidocaine was used for local anesthesia. Following this, a Yueh catheter was introduced. An ultrasound image was saved for documentation purposes. The paracentesis was performed. The catheter was removed and a dressing was applied. The patient tolerated the procedure well without immediate post procedural complication. FINDINGS: A total of approximately 4 liters of yellow fluid was removed. IMPRESSION: Successful ultrasound-guided therapeutic paracentesis yielding 4 liters liters of peritoneal fluid. Read  by: Rowe Robert, PA-C Electronically Signed  By: Aletta Edouard [name].   On: 11/24/2015 15:49   US Paracentesis  11/20/2015  INDICATION: H/o Breast cancer with new liver mass and abdominal ascites. Request was made for paracentesis EXAM: ULTRASOUND GUIDED right mid quadrant PARACENTESIS MEDICATIONS: 1% lidocaine COMPLICATIONS: None immediate. PROCEDURE: Informed written consent was obtained from the patient's husband due to patient confusion and dementia, after a discussion of the risks, benefits and alternatives to treatment. A timeout was performed prior to the initiation of the procedure. Initial ultrasound scanning demonstrates a moderate amount of ascites within the right mid abdominal quadrant. The right mid abdomen was prepped and draped in the usual sterile fashion. 1% lidocaine was used for local anesthesia. Following this, a 7 cm Yueh catheter was introduced. An ultrasound image was saved for documentation purposes. The paracentesis was performed. The catheter was removed and a dressing was applied. The patient tolerated the procedure well without immediate post procedural complication. FINDINGS: A total of approximately 5 Liters of clear yellow fluid was removed. Samples were sent to the laboratory as requested by the clinical team. IMPRESSION: Successful ultrasound-guided paracentesis yielding 5 liters of peritoneal fluid. Read by: Saverio Danker, PA-C Electronically Signed   By: Jacqulynn Cadet [name].   On: 11/20/2015 12:29    Micro Results   Recent Results (from the past 240 hour(s))  Urine culture     Status: None   Collection Time: 11/19/15  2:20 PM  Result Value Ref Range Status   Specimen Description URINE, CLEAN CATCH  Final   Special Requests NONE  Final   Culture 50,000 COLONIES/mL ESCHERICHIA COLI  Final   Report Status 11/21/2015 FINAL  Final   Organism ID, Bacteria ESCHERICHIA COLI  Final      Susceptibility   Escherichia coli - MIC*    AMPICILLIN <=2 SENSITIVE Sensitive      CEFAZOLIN <=4 SENSITIVE Sensitive     CEFTRIAXONE <=1 SENSITIVE Sensitive     CIPROFLOXACIN <=0.25 SENSITIVE Sensitive     GENTAMICIN <=1 SENSITIVE Sensitive     IMIPENEM <=0.25 SENSITIVE Sensitive     NITROFURANTOIN <=16 SENSITIVE Sensitive     TRIMETH/SULFA <=20 SENSITIVE Sensitive     AMPICILLIN/SULBACTAM <=2 SENSITIVE Sensitive     PIP/TAZO <=4 SENSITIVE Sensitive     * 50,000 COLONIES/mL ESCHERICHIA COLI  Anaerobic culture     Status: None   Collection Time: 11/20/15 12:21 PM  Result Value Ref Range Status   Specimen Description PERITONEAL  Final   Special Requests NONE  Final   Gram Stain   Final    NO WBC SEEN NO SQUAMOUS EPITHELIAL CELLS SEEN NO ORGANISMS SEEN Performed at [facility]    Culture   Final    NO ANAEROBES ISOLATED Performed at [facility]    Report Status 11/25/2015 FINAL  Final  Body fluid culture     Status: None   Collection Time: 11/20/15 12:21 PM  Result Value Ref Range Status   Specimen Description PERITONEAL  Final   Special Requests NONE  Final   Gram Stain   Final    MODERATE WBC PRESENT, PREDOMINANTLY MONONUCLEAR NO ORGANISMS SEEN    Culture   Final    NO GROWTH 3 DAYS Performed at [facility]    Report Status 11/23/2015 FINAL  Final       Today   Subjective:   Suzi Mcglown today has no headache,no chest or  abdominal pain,no new weakness tingling or numbness, f Objective:   Blood pressure  145/43, pulse 80, temperature 97.7 F (36.5 C), temperature source Oral, resp. rate 18, height 5\' 4"  (1.626 m), weight 66.3 kg (146 lb 2.6 oz), SpO2 98 %.   Intake/Output Summary (Last 24 hours) at 11/25/15 1450 Last data filed at 11/25/15 1248  Gross per 24 hour  Intake   1177 ml  Output    180 ml  Net    997 ml    Exam  Awake Alert,  [place].AT,PERRAL Supple Neck,No JVD,  Symmetrical Chest wall movement, Good air movement bilaterally, CTAB RRR,No Gallops,Rubs or new Murmurs, No Parasternal Heave +ve  B.Sounds, ascites significantly improved after paracentesis yesterday , No rebound - guarding or rigidity. No Cyanosis, Clubbing or edema, No new Rash or bruise   Data Review   CBC w Diff: Lab Results  Component Value Date   WBC 6.6 11/21/2015   WBC 6.4 06/18/2015   HGB 8.6* 11/22/2015   HGB 8.4* 06/18/2015   HCT 25.4* 11/22/2015   HCT 25.5* 06/18/2015   PLT 151 11/21/2015   PLT 124* 06/18/2015   LYMPHOPCT 29 11/19/2015   LYMPHOPCT 42.3 06/18/2015   MONOPCT 17 11/19/2015   MONOPCT 14.5* 06/18/2015   EOSPCT 1 11/19/2015   EOSPCT 0.9 06/18/2015   BASOPCT 0 11/19/2015   BASOPCT 0.6 06/18/2015    CMP: Lab Results  Component Value Date   NA 138 11/20/2015   NA 140 06/18/2015   K 3.6 11/20/2015   K 3.9 06/18/2015   CL 106 11/20/2015   CL 103 03/22/2013   CO2 23 11/20/2015   CO2 26 06/18/2015   BUN 12 11/20/2015   BUN 13.5 06/18/2015   CREATININE 0.80 11/20/2015   CREATININE 1.0 06/18/2015   PROT 5.3* 11/20/2015   PROT 6.6 06/18/2015   ALBUMIN 2.7* 11/20/2015   ALBUMIN 3.6 06/18/2015   BILITOT 2.6* 11/20/2015   BILITOT 0.98 06/18/2015   ALKPHOS 360* 11/20/2015   ALKPHOS 359* 06/18/2015   AST 65* 11/20/2015   AST 65* 06/18/2015   ALT 20 11/20/2015   ALT 28 06/18/2015  .   Total Time in preparing paper work, data evaluation and todays exam - 35 minutes  Parke Jandreau [name] on 11/25/2015 at 2:50 PM  [facility]   Office  [phone]

## 2015-11-25 NOTE — Progress Notes (Signed)
Physical Therapy Treatment Patient Details Name: Jamie [name] MRN: [id number] DOB: April 20, 1936 Today's Date: 11/25/2015    History of Present Illness a 80 y.o. female with a past medical history of breast cancer, radical mastectomy radiation to lumbar sacral spine and right hip in 2014.   She presents to the emergency department  11/19/15 with complaints of abdominal pain associated with distention.  A CT scan of abdomen and pelvis  revealed new large volume ascites as well as new small right liver lobe mass. Radiology also reported mild right hydronephrosis slightly increased from 11/16/2014. Radiology also noted extensive sclerotic osseous metastatic disease throughout skeleton.     PT Comments    Improved MS. Ambulating with O2 and 1 person. Plans SNF.  Follow Up Recommendations  SNF;Supervision/Assistance - 24 hour     Equipment Recommendations  None recommended by PT    Recommendations for Other Services       Precautions / Restrictions Precautions Precautions: Fall Precaution Comments: on O2, monitor    Mobility  Bed Mobility Overal bed mobility: Needs Assistance Bed Mobility: Supine to Sit     Supine to sit: Supervision        Transfers Overall transfer level: Needs assistance Equipment used: Rolling walker (2 wheeled) Transfers: Sit to/from Stand Sit to Stand: Min guard            Ambulation/Gait Ambulation/Gait assistance: Min assist Ambulation Distance (Feet): 120 Feet Assistive device: Rolling walker (2 wheeled) Gait Pattern/deviations: Step-through pattern     General Gait Details: assist to guide RW. sats on 3 l=96%   Stairs            Wheelchair Mobility    Modified Rankin (Stroke Patients Only)       Balance                                    Cognition Arousal/Alertness: Awake/alert Behavior During Therapy: WFL for tasks assessed/performed   Area of Impairment:  Memory;Safety/judgement;Awareness                    Exercises      General Comments        Pertinent Vitals/Pain Pain Assessment: No/denies pain    Home Living                      Prior Function            PT Goals (current goals can now be found in the care plan section) Progress towards PT goals: Progressing toward goals    Frequency  Min 3X/week    PT Plan Current plan remains appropriate    Co-evaluation             End of Session Equipment Utilized During Treatment: Gait belt Activity Tolerance: Patient tolerated treatment well Patient left: in chair;with call bell/phone within reach;with chair alarm set     Time: 1041-1059 PT Time Calculation (min) (ACUTE ONLY): 18 min  Charges:  $Gait Training: 8-22 mins                    G Codes:      [name] 11/25/2015, 3:39 PM

## 2015-11-25 NOTE — Progress Notes (Signed)
Pt for discharge to [facility].   CSW received pt pasarr and confirmed with [facility] that pt insurance authorization remains valid for admission to [facility] facilitated pt discharge needs including contacting facility, faxing pt discharge information via epic hub, discussing with pt at bedside and pt husband, Clair Gulling via telephone, providing RN phone number to call report, and arranging ambulance transport for pt to [facility].  No further social work needs identified at this time.   CSW signing off.   Alison Murray, MSW, [facility] Work [phone]

## 2015-11-25 NOTE — Clinical Social Work Placement (Signed)
   CLINICAL SOCIAL WORK PLACEMENT  NOTE  Date:  11/25/2015  Patient Details  Name: Jamie [name] MRN: [id number] Date of Birth: 1936/05/01  Clinical Social Work is seeking post-discharge placement for this patient at the [place] level of care (*CSW will initial, date and re-position this form in  chart as items are completed):  Yes   Patient/family provided with [facility] Work Department's list of facilities offering this level of care within the geographic area requested by the patient (or if unable, by the patient's family).  Yes   Patient/family informed of their freedom to choose among providers that offer the needed level of care, that participate in Medicare, Medicaid or managed care program needed by the patient, have an available bed and are willing to accept the patient.  Yes   Patient/family informed of [facility]'s ownership interest in [facility] and [facility], as well as of the fact that they are under no obligation to receive care at these facilities.  PASRR submitted to EDS on 11/21/15     PASRR number received on 11/25/15     Existing PASRR number confirmed on       FL2 transmitted to all facilities in geographic area requested by pt/family on 11/21/15     FL2 transmitted to all facilities within larger geographic area on       Patient informed that his/her managed care company has contracts with or will negotiate with certain facilities, including the following:        Yes   Patient/family informed of bed offers received.  Patient chooses bed at [facility]     Physician recommends and patient chooses bed at      Patient to be transferred to [facility] on 11/25/15.  Patient to be transferred to facility by ambulance Corey Harold)     Patient family notified on 11/25/15 of transfer.  Name of family member notified:  pt notified at bedside and pt husband, Clair Gulling notified via telephone      PHYSICIAN Please sign FL2, Please sign DNR     Additional Comment:    _______________________________________________ Ladell Pier, LCSW 11/25/2015, 3:53 PM
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
## 2015-12-01 ENCOUNTER — Encounter: Payer: Self-pay | Admitting: *Deleted

## 2015-12-08 ENCOUNTER — Other Ambulatory Visit: Payer: Self-pay | Admitting: *Deleted

## 2015-12-08 ENCOUNTER — Encounter: Payer: Self-pay | Admitting: *Deleted

## 2015-12-08 NOTE — Patient Outreach (Signed)
[facility]) Care Management  12/08/2015  ANHAR RABORN [birth year] [id number]  Phone call to patient's spouse, on consent to discuss patient's status in rehabilitation.  Per patient's spouse, he is not capable of caring for patient at home due to his own medical problems. He will be having a heart procedure on 12/30/15. He has filed 2 appeals, one has been denied the other appeal is pending.  Patient discharging to her daughter's home is not an option, as they both work full time.  Per patient's spouse, he has applied for long term medicaid.  Per patient';s spouse, patient is becoming more confused and disoriented, not recognizing friends that visit or that spouse has been there the day before.  Hospice has been recommended.    Plan:  This Education officer, museum will meet with patient's spouse and daughter at [facility] at 12:00pm today.    Sheralyn Boatman [facility] Care Management [phone]

## 2015-12-08 NOTE — Patient Outreach (Addendum)
[facility]) Care Management  [facility] Social Work  12/08/2015  ILSE BILLMAN 04/14/1936 [id number]  Subjective:  Patient  Is a 80 year old female currently residing at [facility] for rehabilitation.  Objective:   Current Medications:  Current Outpatient Prescriptions  Medication Sig Dispense Refill  . acetaminophen (TYLENOL) 500 MG tablet Take 500 mg by mouth every 6 (six) hours as needed for mild pain or moderate pain (pain).     Marland Kitchen alum & mag hydroxide-simeth (MAALOX/MYLANTA) 200-200-20 MG/5ML suspension Take 30 mLs by mouth every 6 (six) hours as needed for indigestion or heartburn (dyspepsia). 355 mL 0  . benzonatate (TESSALON) 200 MG capsule Take 200 mg by mouth 3 (three) times daily as needed for cough.   3  . bisacodyl (DULCOLAX) 5 MG EC tablet Take 1 tablet (5 mg total) by mouth daily as needed for moderate constipation. 30 tablet 0  . Cholecalciferol (VITAMIN D) 1000 UNITS capsule Take 1,000 Units by mouth daily.     . clonazePAM (KLONOPIN) 0.5 MG tablet Take 1 tablet (0.5 mg total) by mouth 2 (two) times daily. 30 tablet 0  . esomeprazole (NEXIUM) 40 MG capsule Take 1 capsule (40 mg total) by mouth daily. 90 capsule 3  . everolimus (AFINITOR) 10 MG tablet Take 1 tablet (10 mg total) by mouth daily. 30 tablet 3  . feeding supplement, ENSURE ENLIVE, (ENSURE ENLIVE) LIQD Take 237 mLs by mouth 2 (two) times daily between meals. 237 mL 12  . GLYCERIN ADULT 2 G suppository Place 1 suppository rectally daily as needed for mild constipation (constipation).     . hydrocortisone (ANUSOL-HC) 2.5 % rectal cream Place 1 application rectally daily as needed for hemorrhoids or itching.     . insulin aspart (NOVOLOG) 100 UNIT/ML injection Inject 0-15 Units into the skin 3 (three) times daily with meals. 10 mL 11  . levalbuterol (XOPENEX) 1.25 MG/0.5ML nebulizer solution Take 1.25 mg by nebulization every 6 (six) hours as needed for wheezing or  shortness of breath. 1 each 12  . metoCLOPramide (REGLAN) 5 MG tablet Take 5 mg by mouth 3 (three) times daily as needed for nausea.   3  . ondansetron (ZOFRAN) 4 MG tablet Take 1 tablet (4 mg total) by mouth every 6 (six) hours as needed for nausea. 20 tablet 0  . Polyethyl Glycol-Propyl Glycol (SYSTANE OP) Apply 1-2 drops to eye daily as needed (dry eyes).     . polyethylene glycol (MIRALAX / GLYCOLAX) packet Take 17 g by mouth daily as needed for mild constipation.    Marland Kitchen QVAR 80 MCG/ACT inhaler Inhale 2 puffs into the lungs at bedtime.     . traMADol (ULTRAM) 50 MG tablet Take 1 tablet (50 mg total) by mouth every 6 (six) hours as needed for severe pain. 30 tablet 0   No current facility-administered medications for this visit.    Functional Status:  In your present state of health, do you have any difficulty performing the following activities: 11/20/2015 02/07/2015  Hearing? N N  Vision? N N  Difficulty concentrating or making decisions? N N  Walking or climbing stairs? Y Y  Dressing or bathing? Y N  Doing errands, shopping? Y N    Fall/Depression Screening:  No flowsheet data found.  Assessment: This Child psychotherapist met with patient's spouse, patient's step daughter and Toniann Fail from the business office to discuss patient's care plan.  Per patient's spouse and step-daughter they are not capable of taking care  of patient at home and would like patient to stay long term.  Per patient's spouse, his daughter applied for Medicaid for patient.  Spoke with Abigail Butts from the business office who states that she had contacted the Department of Social Services and patient's Medicaid was denied.  However based on patient's current income there must have been a mistake made.  Per patient's spouse, they had applied for Medicaid before in the past and were denied based on their combined income.  Abigail Butts is looking into this denial and has called the case manager to confirm that the denial was not from a previous  application.   Per Abigail Butts, the status of the second appeal through Medicare remains pending.  Patient's spouse and step-daughter presented with all possible scenario's of what they would be responsible for if the Medicaid is denied as well as the appeal.  However , per Abigail Butts with patient's current income even combined with spouse the Medicaid application should be approved.  Abigail Butts agrees to follow this case closer.  It was explained that due to the fact that the plan has now changed to long term care.  This Education officer, museum will no longer follow patient, however if long term plan changes this social worker will open patient's case again.  This social worker's contact information provided.  Plan: This social worker will close patient's case to [facility] care management at this time.            Abigail Butts from the business office at [employer] will follow up with the status of the Medicaid application and long term care            needs.    Sheralyn Boatman [facility] Care Management [phone]

## 2015-12-09 ENCOUNTER — Other Ambulatory Visit (HOSPITAL_COMMUNITY): Payer: Self-pay | Admitting: Internal Medicine

## 2015-12-09 DIAGNOSIS — R188 Other ascites: Secondary | ICD-10-CM

## 2015-12-15 ENCOUNTER — Ambulatory Visit (HOSPITAL_COMMUNITY)
Admission: RE | Admit: 2015-12-15 | Discharge: 2015-12-15 | Disposition: A | Discharge status: Home / Self Care | Payer: Commercial Managed Care - HMO | Source: Ambulatory Visit | Attending: Internal Medicine | Admitting: Internal Medicine

## 2015-12-15 DIAGNOSIS — R188 Other ascites: Secondary | ICD-10-CM | POA: Insufficient documentation

## 2015-12-15 DIAGNOSIS — C50919 Malignant neoplasm of unspecified site of unspecified female breast: Secondary | ICD-10-CM | POA: Diagnosis not present

## 2015-12-15 MED ORDER — LIDOCAINE HCL (PF) 1 % IJ SOLN
INTRAMUSCULAR | Status: AC
  Filled 2015-12-15: qty 10

## 2015-12-15 NOTE — Procedures (Signed)
Ultrasound-guided therapeutic paracentesis performed yielding 2.9 liters of clear yellow colored fluid. No immediate complications.  Paullette Mckain E 11:07 AM 12/15/2015

## 2016-01-05 ENCOUNTER — Other Ambulatory Visit (HOSPITAL_COMMUNITY): Payer: Self-pay | Admitting: Endocrinology

## 2016-01-05 DIAGNOSIS — C7951 Secondary malignant neoplasm of bone: Secondary | ICD-10-CM

## 2016-01-05 DIAGNOSIS — K7290 Hepatic failure, unspecified without coma: Secondary | ICD-10-CM

## 2016-01-12 ENCOUNTER — Ambulatory Visit (HOSPITAL_COMMUNITY): Payer: Commercial Managed Care - HMO

## 2016-02-16 DEATH — deceased

## 2016-04-03 ENCOUNTER — Other Ambulatory Visit: Payer: Self-pay | Admitting: Nurse Practitioner

## 2016-04-05 ENCOUNTER — Other Ambulatory Visit: Payer: Self-pay | Admitting: Nurse Practitioner

## 2016-09-06 IMAGING — CR DG CHEST 2V
2 series · 2 of 2 positions shown · non-contrast
Comparison: 02/07/2015 and chest CTA dated 02/07/2015.

CLINICAL DATA: Shortness of breath. Substernal chest pain and
pressure. Dry cough the past 6 months. Asthma. Breast cancer.

EXAM:
CHEST  2 VIEW

[chest pa]
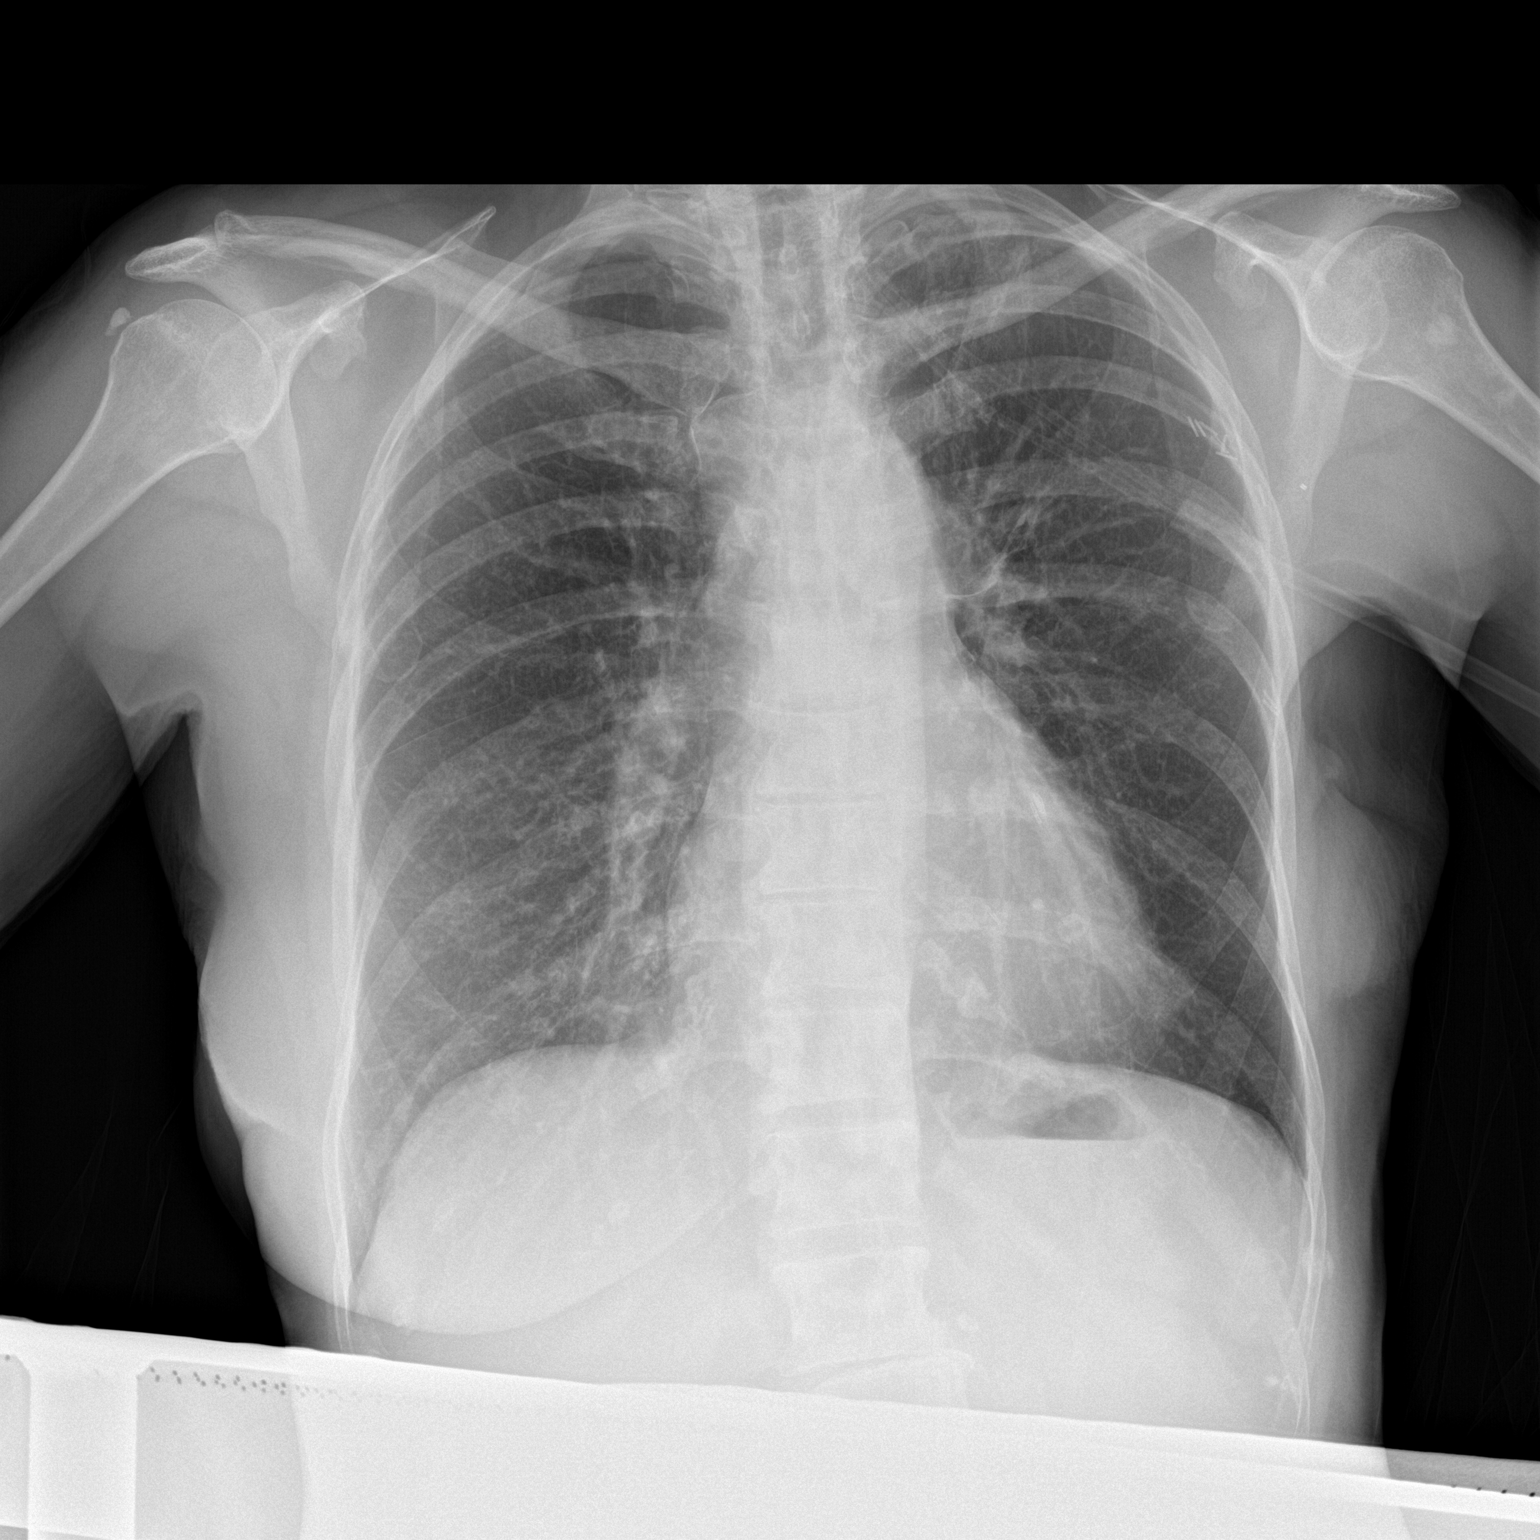

[chest lat]
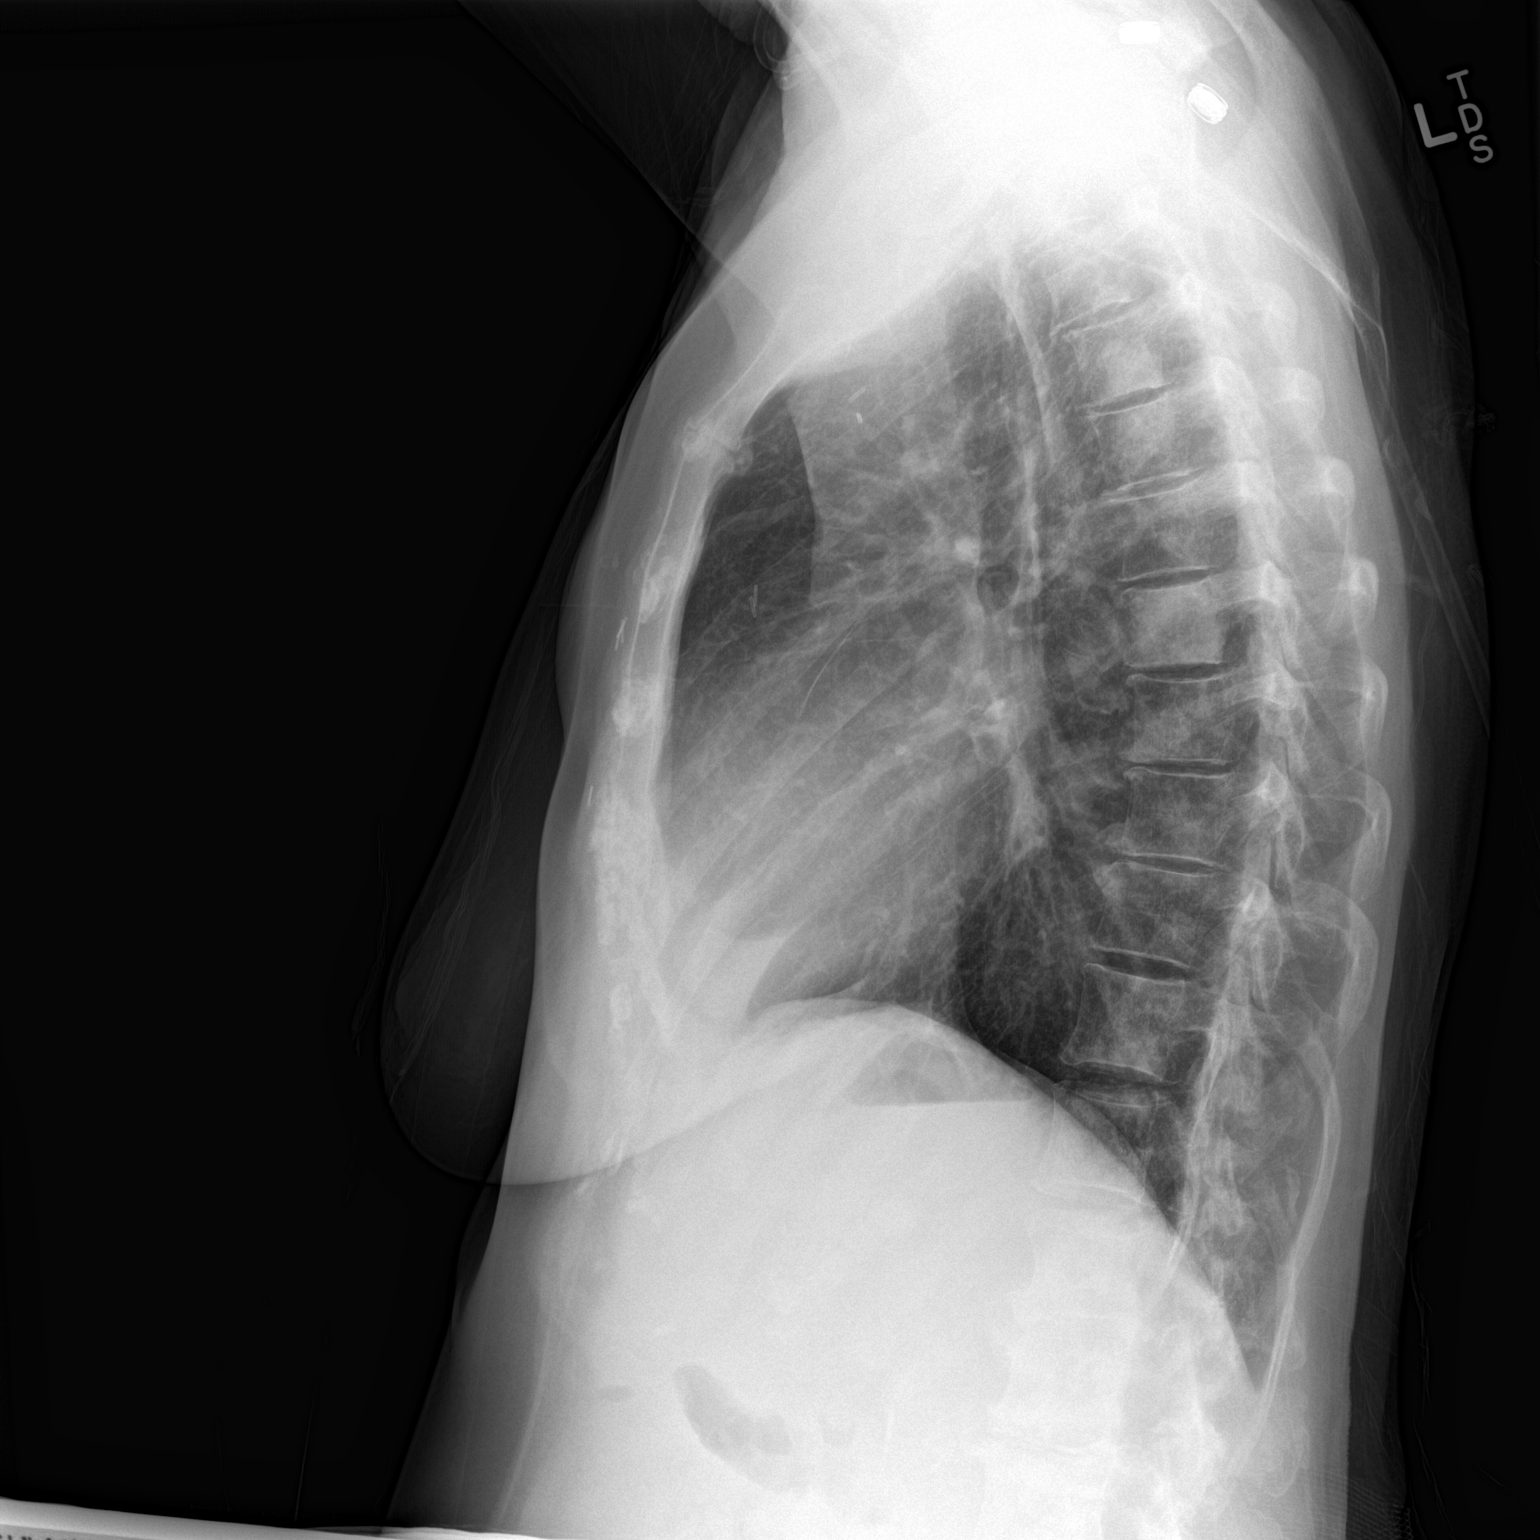

[2 of 2 positions shown; findings below may reference images not displayed]

FINDINGS: Normal sized heart. Clear lungs. Multiple areas of vertebral body
and proximal left humerus sclerosis without significant change. Left
postmastectomy changes with left axillary surgical clips.
IMPRESSION: 1. No acute abnormality.
2. Stable sclerotic bony metastatic disease compatible with
metastatic breast cancer.

## 2016-11-12 IMAGING — NM NM BONE WHOLE BODY
2 series · 2 of 2 positions shown · non-contrast
Comparison: 02/19/2015

CLINICAL DATA: Breast cancer with bone metastases.

EXAM:
NUCLEAR MEDICINE WHOLE BODY BONE SCAN
TECHNIQUE: Whole body anterior and posterior images were obtained approximately
3 hours after intravenous injection of radiopharmaceutical.
RADIOPHARMACEUTICALS:  25.0 mCi Sechnetium-VVm MDP IV

[Series 1: wbr_bone_40 whole body · 2.66mm/px · 1 of 1 slices shown (1 of 2)]
[im 1/1]
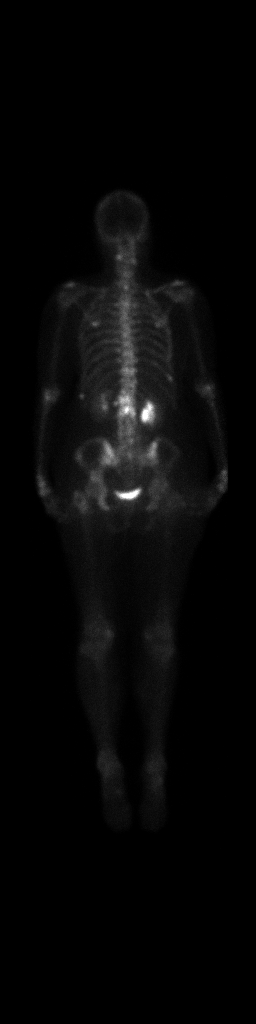

[Series 1: wbr_bone_40 whole body · 2.66mm/px · 1 of 1 slices shown (2 of 2)]
[im 1/1]
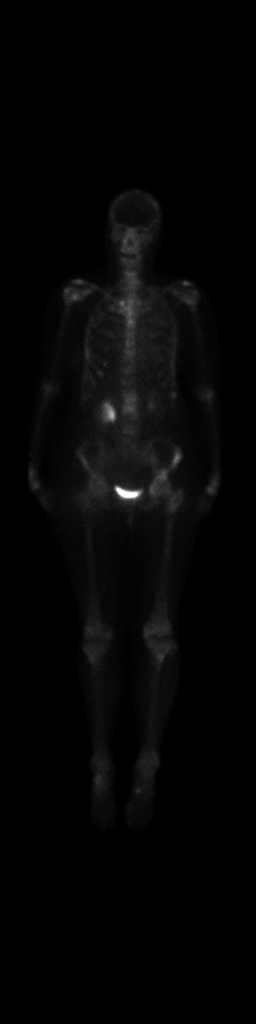

[2 of 2 positions shown; findings below may reference images not displayed]

FINDINGS: Multifocal areas of abnormal increased uptake are again identified
compatible with the known history of metastatic breast cancer. Areas
of increased uptake are noted involving the proximal left femur,
left ischial bone, L1 and L2 vertebra, bilateral posterior ribs and
left scapula. When compared with the previous exam no new foci of
abnormal increased uptake identified to suggest progression of
disease. Previously noted focus of increased uptake within the upper
thoracic spine has resolved in the interval. Physiologic tracer
activity is seen within both kidneys an urinary bladder.
IMPRESSION: 1. Stable to improved appearance of multi focal bone metastases.
